# Patient Record
Sex: Female | Born: 1970 | Race: Black or African American | Hispanic: No | Marital: Single | Smoking: Never smoker
Health system: Southern US, Community
[De-identification: ages and names within clinical notes are randomized; demographics above are authoritative.]

## PROBLEM LIST (undated history)

## (undated) DIAGNOSIS — J9601 Acute respiratory failure with hypoxia: Secondary | ICD-10-CM

## (undated) DIAGNOSIS — I2489 Other forms of acute ischemic heart disease: Secondary | ICD-10-CM

## (undated) DIAGNOSIS — D649 Anemia, unspecified: Secondary | ICD-10-CM

## (undated) DIAGNOSIS — J189 Pneumonia, unspecified organism: Secondary | ICD-10-CM

## (undated) DIAGNOSIS — E876 Hypokalemia: Secondary | ICD-10-CM

## (undated) DIAGNOSIS — N179 Acute kidney failure, unspecified: Secondary | ICD-10-CM

## (undated) DIAGNOSIS — E162 Hypoglycemia, unspecified: Secondary | ICD-10-CM

## (undated) DIAGNOSIS — R197 Diarrhea, unspecified: Secondary | ICD-10-CM

## (undated) DIAGNOSIS — I2699 Other pulmonary embolism without acute cor pulmonale: Secondary | ICD-10-CM

## (undated) DIAGNOSIS — G4733 Obstructive sleep apnea (adult) (pediatric): Secondary | ICD-10-CM

## (undated) DIAGNOSIS — R7401 Elevation of levels of liver transaminase levels: Secondary | ICD-10-CM

## (undated) DIAGNOSIS — I248 Other forms of acute ischemic heart disease: Secondary | ICD-10-CM

## (undated) DIAGNOSIS — J9602 Acute respiratory failure with hypercapnia: Secondary | ICD-10-CM

## (undated) HISTORY — DX: Elevation of levels of liver transaminase levels: R74.01

## (undated) HISTORY — DX: Other pulmonary embolism without acute cor pulmonale: I26.99

## (undated) HISTORY — DX: Anemia, unspecified: D64.9

## (undated) HISTORY — DX: Diarrhea, unspecified: R19.7

## (undated) HISTORY — DX: Acute respiratory failure with hypoxia: J96.01

## (undated) HISTORY — DX: Other forms of acute ischemic heart disease: I24.8

## (undated) HISTORY — DX: Other forms of acute ischemic heart disease: I24.89

## (undated) HISTORY — DX: Hypokalemia: E87.6

## (undated) HISTORY — DX: Obstructive sleep apnea (adult) (pediatric): G47.33

## (undated) HISTORY — DX: Acute kidney failure, unspecified: N17.9

## (undated) HISTORY — DX: Morbid (severe) obesity due to excess calories: E66.01

## (undated) HISTORY — DX: Acute respiratory failure with hypoxia: J96.02

## (undated) HISTORY — DX: Hypoglycemia, unspecified: E16.2

## (undated) HISTORY — DX: Pneumonia, unspecified organism: J18.9

---

## 2020-06-25 ENCOUNTER — Emergency Department (HOSPITAL_COMMUNITY): Payer: Self-pay

## 2020-06-25 ENCOUNTER — Inpatient Hospital Stay (HOSPITAL_COMMUNITY): Payer: Self-pay

## 2020-06-25 ENCOUNTER — Inpatient Hospital Stay (HOSPITAL_COMMUNITY)
Admission: EM | Admit: 2020-06-25 | Discharge: 2020-07-25 | DRG: 208 | Disposition: A | Discharge status: Home / Self Care | Payer: Self-pay | Attending: Internal Medicine | Admitting: Internal Medicine

## 2020-06-25 DIAGNOSIS — J9621 Acute and chronic respiratory failure with hypoxia: Secondary | ICD-10-CM | POA: Diagnosis present

## 2020-06-25 DIAGNOSIS — E662 Morbid (severe) obesity with alveolar hypoventilation: Secondary | ICD-10-CM | POA: Diagnosis present

## 2020-06-25 DIAGNOSIS — E870 Hyperosmolality and hypernatremia: Secondary | ICD-10-CM | POA: Diagnosis not present

## 2020-06-25 DIAGNOSIS — R7989 Other specified abnormal findings of blood chemistry: Secondary | ICD-10-CM

## 2020-06-25 DIAGNOSIS — J9602 Acute respiratory failure with hypercapnia: Secondary | ICD-10-CM

## 2020-06-25 DIAGNOSIS — Z452 Encounter for adjustment and management of vascular access device: Secondary | ICD-10-CM

## 2020-06-25 DIAGNOSIS — R0602 Shortness of breath: Secondary | ICD-10-CM

## 2020-06-25 DIAGNOSIS — G9341 Metabolic encephalopathy: Secondary | ICD-10-CM

## 2020-06-25 DIAGNOSIS — E874 Mixed disorder of acid-base balance: Secondary | ICD-10-CM | POA: Diagnosis present

## 2020-06-25 DIAGNOSIS — J9692 Respiratory failure, unspecified with hypercapnia: Secondary | ICD-10-CM | POA: Diagnosis present

## 2020-06-25 DIAGNOSIS — Z597 Insufficient social insurance and welfare support: Secondary | ICD-10-CM

## 2020-06-25 DIAGNOSIS — R0603 Acute respiratory distress: Secondary | ICD-10-CM

## 2020-06-25 DIAGNOSIS — J189 Pneumonia, unspecified organism: Secondary | ICD-10-CM | POA: Insufficient documentation

## 2020-06-25 DIAGNOSIS — T502X5A Adverse effect of carbonic-anhydrase inhibitors, benzothiadiazides and other diuretics, initial encounter: Secondary | ICD-10-CM | POA: Diagnosis not present

## 2020-06-25 DIAGNOSIS — E162 Hypoglycemia, unspecified: Secondary | ICD-10-CM

## 2020-06-25 DIAGNOSIS — R7401 Elevation of levels of liver transaminase levels: Secondary | ICD-10-CM

## 2020-06-25 DIAGNOSIS — Z5901 Sheltered homelessness: Secondary | ICD-10-CM

## 2020-06-25 DIAGNOSIS — R34 Anuria and oliguria: Secondary | ICD-10-CM | POA: Diagnosis not present

## 2020-06-25 DIAGNOSIS — Z6841 Body Mass Index (BMI) 40.0 and over, adult: Secondary | ICD-10-CM

## 2020-06-25 DIAGNOSIS — R778 Other specified abnormalities of plasma proteins: Secondary | ICD-10-CM

## 2020-06-25 DIAGNOSIS — R197 Diarrhea, unspecified: Secondary | ICD-10-CM | POA: Diagnosis not present

## 2020-06-25 DIAGNOSIS — D6489 Other specified anemias: Secondary | ICD-10-CM | POA: Diagnosis present

## 2020-06-25 DIAGNOSIS — Z86711 Personal history of pulmonary embolism: Secondary | ICD-10-CM

## 2020-06-25 DIAGNOSIS — R7303 Prediabetes: Secondary | ICD-10-CM | POA: Diagnosis present

## 2020-06-25 DIAGNOSIS — J9690 Respiratory failure, unspecified, unspecified whether with hypoxia or hypercapnia: Secondary | ICD-10-CM

## 2020-06-25 DIAGNOSIS — D638 Anemia in other chronic diseases classified elsewhere: Secondary | ICD-10-CM | POA: Diagnosis present

## 2020-06-25 DIAGNOSIS — D696 Thrombocytopenia, unspecified: Secondary | ICD-10-CM | POA: Diagnosis present

## 2020-06-25 DIAGNOSIS — E860 Dehydration: Secondary | ICD-10-CM | POA: Diagnosis present

## 2020-06-25 DIAGNOSIS — I2609 Other pulmonary embolism with acute cor pulmonale: Secondary | ICD-10-CM | POA: Diagnosis present

## 2020-06-25 DIAGNOSIS — I50810 Right heart failure, unspecified: Secondary | ICD-10-CM | POA: Diagnosis present

## 2020-06-25 DIAGNOSIS — I959 Hypotension, unspecified: Secondary | ICD-10-CM | POA: Diagnosis present

## 2020-06-25 DIAGNOSIS — K7200 Acute and subacute hepatic failure without coma: Secondary | ICD-10-CM | POA: Diagnosis present

## 2020-06-25 DIAGNOSIS — E876 Hypokalemia: Secondary | ICD-10-CM

## 2020-06-25 DIAGNOSIS — J9622 Acute and chronic respiratory failure with hypercapnia: Principal | ICD-10-CM | POA: Diagnosis present

## 2020-06-25 DIAGNOSIS — J9601 Acute respiratory failure with hypoxia: Secondary | ICD-10-CM

## 2020-06-25 DIAGNOSIS — I2699 Other pulmonary embolism without acute cor pulmonale: Secondary | ICD-10-CM

## 2020-06-25 DIAGNOSIS — I248 Other forms of acute ischemic heart disease: Secondary | ICD-10-CM | POA: Diagnosis present

## 2020-06-25 DIAGNOSIS — N179 Acute kidney failure, unspecified: Secondary | ICD-10-CM

## 2020-06-25 DIAGNOSIS — Z20822 Contact with and (suspected) exposure to covid-19: Secondary | ICD-10-CM | POA: Diagnosis present

## 2020-06-25 DIAGNOSIS — K8020 Calculus of gallbladder without cholecystitis without obstruction: Secondary | ICD-10-CM | POA: Diagnosis present

## 2020-06-25 DIAGNOSIS — I471 Supraventricular tachycardia: Secondary | ICD-10-CM | POA: Diagnosis present

## 2020-06-25 HISTORY — DX: Respiratory failure, unspecified with hypercapnia: J96.92

## 2020-06-25 LAB — I-STAT CHEM 8, ED
BUN: 44 mg/dL — ABNORMAL HIGH (ref 6–20)
Calcium, Ion: 0.89 mmol/L — CL (ref 1.15–1.40)
Chloride: 99 mmol/L (ref 98–111)
Creatinine, Ser: 3.5 mg/dL — ABNORMAL HIGH (ref 0.44–1.00)
Glucose, Bld: 83 mg/dL (ref 70–99)
HCT: 50 % — ABNORMAL HIGH (ref 36.0–46.0)
Hemoglobin: 17 g/dL — ABNORMAL HIGH (ref 12.0–15.0)
Potassium: 4.5 mmol/L (ref 3.5–5.1)
Sodium: 140 mmol/L (ref 135–145)
TCO2: 29 mmol/L (ref 22–32)

## 2020-06-25 LAB — I-STAT ARTERIAL BLOOD GAS, ED
Acid-Base Excess: 2 mmol/L (ref 0.0–2.0)
Bicarbonate: 32.9 mmol/L — ABNORMAL HIGH (ref 20.0–28.0)
Calcium, Ion: 0.92 mmol/L — ABNORMAL LOW (ref 1.15–1.40)
HCT: 45 % (ref 36.0–46.0)
Hemoglobin: 15.3 g/dL — ABNORMAL HIGH (ref 12.0–15.0)
O2 Saturation: 43 %
Patient temperature: 97.9
Potassium: 4.3 mmol/L (ref 3.5–5.1)
Sodium: 141 mmol/L (ref 135–145)
TCO2: 35 mmol/L — ABNORMAL HIGH (ref 22–32)
pCO2 arterial: 78.3 mmHg (ref 32.0–48.0)
pH, Arterial: 7.229 — ABNORMAL LOW (ref 7.350–7.450)
pO2, Arterial: 29 mmHg — CL (ref 83.0–108.0)

## 2020-06-25 LAB — CBC WITH DIFFERENTIAL/PLATELET
Abs Immature Granulocytes: 0.37 10*3/uL — ABNORMAL HIGH (ref 0.00–0.07)
Basophils Absolute: 0 10*3/uL (ref 0.0–0.1)
Basophils Relative: 0 %
Eosinophils Absolute: 0 10*3/uL (ref 0.0–0.5)
Eosinophils Relative: 0 %
HCT: 44.1 % (ref 36.0–46.0)
Hemoglobin: 12 g/dL (ref 12.0–15.0)
Immature Granulocytes: 2 %
Lymphocytes Relative: 7 %
Lymphs Abs: 1.1 10*3/uL (ref 0.7–4.0)
MCH: 24.5 pg — ABNORMAL LOW (ref 26.0–34.0)
MCHC: 27.2 g/dL — ABNORMAL LOW (ref 30.0–36.0)
MCV: 90.2 fL (ref 80.0–100.0)
Monocytes Absolute: 0.9 10*3/uL (ref 0.1–1.0)
Monocytes Relative: 6 %
Neutro Abs: 13.2 10*3/uL — ABNORMAL HIGH (ref 1.7–7.7)
Neutrophils Relative %: 85 %
Platelets: 320 10*3/uL (ref 150–400)
RBC: 4.89 MIL/uL (ref 3.87–5.11)
RDW: 18.6 % — ABNORMAL HIGH (ref 11.5–15.5)
WBC: 15.6 10*3/uL — ABNORMAL HIGH (ref 4.0–10.5)
nRBC: 2.2 % — ABNORMAL HIGH (ref 0.0–0.2)

## 2020-06-25 LAB — GLUCOSE, CAPILLARY: Glucose-Capillary: 95 mg/dL (ref 70–99)

## 2020-06-25 LAB — URINALYSIS, ROUTINE W REFLEX MICROSCOPIC
Glucose, UA: 50 mg/dL — AB
Hgb urine dipstick: NEGATIVE
Ketones, ur: 5 mg/dL — AB
Leukocytes,Ua: NEGATIVE
Nitrite: NEGATIVE
Protein, ur: 100 mg/dL — AB
Specific Gravity, Urine: 1.02 (ref 1.005–1.030)
pH: 5 (ref 5.0–8.0)

## 2020-06-25 LAB — LACTIC ACID, PLASMA
Lactic Acid, Venous: 5.9 mmol/L (ref 0.5–1.9)
Lactic Acid, Venous: 4.9 mmol/L (ref 0.5–1.9)

## 2020-06-25 LAB — CREATININE, SERUM
Creatinine, Ser: 3.62 mg/dL — ABNORMAL HIGH (ref 0.44–1.00)
GFR, Estimated: 15 mL/min — ABNORMAL LOW (ref >60)

## 2020-06-25 LAB — COMPREHENSIVE METABOLIC PANEL
ALT: 346 U/L — ABNORMAL HIGH (ref 0–44)
AST: 762 U/L — ABNORMAL HIGH (ref 15–41)
Albumin: 3.6 g/dL (ref 3.5–5.0)
Alkaline Phosphatase: 110 U/L (ref 38–126)
Anion gap: 24 — ABNORMAL HIGH (ref 5–15)
BUN: 41 mg/dL — ABNORMAL HIGH (ref 6–20)
CO2: 20 mmol/L — ABNORMAL LOW (ref 22–32)
Calcium: 8.1 mg/dL — ABNORMAL LOW (ref 8.9–10.3)
Chloride: 96 mmol/L — ABNORMAL LOW (ref 98–111)
Creatinine, Ser: 3.83 mg/dL — ABNORMAL HIGH (ref 0.44–1.00)
GFR, Estimated: 14 mL/min — ABNORMAL LOW (ref >60)
Glucose, Bld: 76 mg/dL (ref 70–99)
Potassium: 4.6 mmol/L (ref 3.5–5.1)
Sodium: 140 mmol/L (ref 135–145)
Total Bilirubin: 4.5 mg/dL — ABNORMAL HIGH (ref 0.3–1.2)
Total Protein: 7.9 g/dL (ref 6.5–8.1)

## 2020-06-25 LAB — RAPID URINE DRUG SCREEN, HOSP PERFORMED
Amphetamines: NOT DETECTED
Barbiturates: NOT DETECTED
Benzodiazepines: NOT DETECTED
Cocaine: NOT DETECTED
Opiates: NOT DETECTED
Tetrahydrocannabinol: NOT DETECTED

## 2020-06-25 LAB — CK: Total CK: 124 U/L (ref 38–234)

## 2020-06-25 LAB — RESP PANEL BY RT-PCR (FLU A&B, COVID) ARPGX2
Influenza A by PCR: NEGATIVE
Influenza B by PCR: NEGATIVE
SARS Coronavirus 2 by RT PCR: NEGATIVE

## 2020-06-25 LAB — PROTIME-INR
INR: 2.1 — ABNORMAL HIGH (ref 0.8–1.2)
Prothrombin Time: 23.5 seconds — ABNORMAL HIGH (ref 11.4–15.2)

## 2020-06-25 LAB — TYPE AND SCREEN
ABO/RH(D): O POS
Antibody Screen: NEGATIVE

## 2020-06-25 LAB — TROPONIN I (HIGH SENSITIVITY)
Troponin I (High Sensitivity): 120 ng/L (ref <18)
Troponin I (High Sensitivity): 205 ng/L (ref <18)

## 2020-06-25 LAB — CBG MONITORING, ED: Glucose-Capillary: 84 mg/dL (ref 70–99)

## 2020-06-25 MED ORDER — CHLORHEXIDINE GLUCONATE 0.12% ORAL RINSE (MEDLINE KIT)
15.0000 mL | Freq: Two times a day (BID) | OROMUCOSAL | Status: DC
  Administered 2020-06-25 – 2020-06-29 (×8): 15 mL via OROMUCOSAL

## 2020-06-25 MED ORDER — ETOMIDATE 2 MG/ML IV SOLN
INTRAVENOUS | Status: AC
  Filled 2020-06-25: qty 20

## 2020-06-25 MED ORDER — SODIUM CHLORIDE 0.9 % IV BOLUS
1000.0000 mL | Freq: Once | INTRAVENOUS | Status: AC
  Administered 2020-06-25: 1000 mL via INTRAVENOUS

## 2020-06-25 MED ORDER — POLYETHYLENE GLYCOL 3350 17 G PO PACK: 17.0000 g | PACK | Freq: Every day | ORAL | Status: DC | PRN

## 2020-06-25 MED ORDER — INSULIN ASPART 100 UNIT/ML IJ SOLN: 0.0000 [IU] | INTRAMUSCULAR | Status: DC

## 2020-06-25 MED ORDER — LACTATED RINGERS IV BOLUS: 1000.0000 mL | Freq: Once | INTRAVENOUS | Status: DC

## 2020-06-25 MED ORDER — SUCCINYLCHOLINE CHLORIDE 200 MG/10ML IV SOSY
PREFILLED_SYRINGE | INTRAVENOUS | Status: AC
  Filled 2020-06-25: qty 10

## 2020-06-25 MED ORDER — FENTANYL CITRATE (PF) 100 MCG/2ML IJ SOLN
100.0000 ug | INTRAMUSCULAR | Status: DC | PRN
  Administered 2020-06-26 – 2020-06-28 (×4): 100 ug via INTRAVENOUS
  Filled 2020-06-25 (×4): qty 2

## 2020-06-25 MED ORDER — ORAL CARE MOUTH RINSE
15.0000 mL | OROMUCOSAL | Status: DC
  Administered 2020-06-25 – 2020-06-29 (×34): 15 mL via OROMUCOSAL

## 2020-06-25 MED ORDER — ETOMIDATE 2 MG/ML IV SOLN
INTRAVENOUS | Status: AC | PRN
  Administered 2020-06-25: 20 mg via INTRAVENOUS

## 2020-06-25 MED ORDER — DOCUSATE SODIUM 100 MG PO CAPS: 100.0000 mg | ORAL_CAPSULE | Freq: Two times a day (BID) | ORAL | Status: DC | PRN

## 2020-06-25 MED ORDER — SUCCINYLCHOLINE CHLORIDE 20 MG/ML IJ SOLN
INTRAMUSCULAR | Status: AC | PRN
  Administered 2020-06-25: 100 mg via INTRAVENOUS

## 2020-06-25 MED ORDER — PROPOFOL 1000 MG/100ML IV EMUL
INTRAVENOUS | Status: AC | PRN
  Administered 2020-06-25: 5 ug via INTRAVENOUS

## 2020-06-25 MED ORDER — PROPOFOL 1000 MG/100ML IV EMUL
0.0000 ug/kg/min | INTRAVENOUS | Status: DC
  Administered 2020-06-25: 5 ug/kg/min via INTRAVENOUS
  Administered 2020-06-25: 10 ug/kg/min via INTRAVENOUS
  Administered 2020-06-26: 40 ug/kg/min via INTRAVENOUS
  Administered 2020-06-26: 30 ug/kg/min via INTRAVENOUS
  Administered 2020-06-26: 10 ug/kg/min via INTRAVENOUS
  Administered 2020-06-26 (×3): 50 ug/kg/min via INTRAVENOUS
  Administered 2020-06-26 – 2020-06-27 (×2): 40 ug/kg/min via INTRAVENOUS
  Administered 2020-06-27: 20 ug/kg/min via INTRAVENOUS
  Administered 2020-06-27: 50 ug/kg/min via INTRAVENOUS
  Administered 2020-06-27: 25 ug/kg/min via INTRAVENOUS
  Administered 2020-06-27: 30 ug/kg/min via INTRAVENOUS
  Administered 2020-06-27: 22.222 ug/kg/min via INTRAVENOUS
  Administered 2020-06-27: 25 ug/kg/min via INTRAVENOUS
  Administered 2020-06-27 – 2020-06-28 (×3): 30 ug/kg/min via INTRAVENOUS
  Administered 2020-06-28 (×2): 40 ug/kg/min via INTRAVENOUS
  Administered 2020-06-28 (×2): 30 ug/kg/min via INTRAVENOUS
  Administered 2020-06-29 (×3): 40 ug/kg/min via INTRAVENOUS
  Filled 2020-06-25 (×25): qty 100

## 2020-06-25 MED ORDER — HEPARIN SODIUM (PORCINE) 5000 UNIT/ML IJ SOLN
5000.0000 [IU] | Freq: Three times a day (TID) | INTRAMUSCULAR | Status: DC
  Administered 2020-06-26: 5000 [IU] via SUBCUTANEOUS
  Filled 2020-06-25: qty 1

## 2020-06-25 MED ORDER — SODIUM CHLORIDE 0.9 % IV SOLN
500.0000 mg | Freq: Once | INTRAVENOUS | Status: AC
  Administered 2020-06-25: 500 mg via INTRAVENOUS
  Filled 2020-06-25: qty 500

## 2020-06-25 MED ORDER — SODIUM CHLORIDE 0.9 % IV SOLN
3.0000 g | Freq: Two times a day (BID) | INTRAVENOUS | Status: DC
  Administered 2020-06-25 – 2020-06-26 (×2): 3 g via INTRAVENOUS
  Filled 2020-06-25 (×4): qty 8

## 2020-06-25 MED ORDER — ROCURONIUM BROMIDE 10 MG/ML (PF) SYRINGE
PREFILLED_SYRINGE | INTRAVENOUS | Status: AC
  Filled 2020-06-25: qty 10

## 2020-06-25 MED ORDER — NALOXONE HCL 0.4 MG/ML IJ SOLN
INTRAMUSCULAR | Status: AC
  Filled 2020-06-25: qty 1

## 2020-06-25 MED ORDER — SODIUM CHLORIDE 0.9 % IV SOLN
INTRAVENOUS | Status: AC | PRN
  Administered 2020-06-25: 999 mL/h via INTRAVENOUS

## 2020-06-25 MED ORDER — SODIUM CHLORIDE 0.9 % IV SOLN
1.0000 g | Freq: Once | INTRAVENOUS | Status: AC
  Administered 2020-06-25: 1 g via INTRAVENOUS
  Filled 2020-06-25: qty 10

## 2020-06-25 MED ORDER — PANTOPRAZOLE SODIUM 40 MG IV SOLR
40.0000 mg | Freq: Every day | INTRAVENOUS | Status: DC
  Administered 2020-06-25 – 2020-07-01 (×7): 40 mg via INTRAVENOUS
  Filled 2020-06-25 (×7): qty 40

## 2020-06-25 MED ORDER — CHLORHEXIDINE GLUCONATE CLOTH 2 % EX PADS
6.0000 | MEDICATED_PAD | Freq: Every day | CUTANEOUS | Status: DC
  Administered 2020-06-25 – 2020-07-01 (×6): 6 via TOPICAL

## 2020-06-25 MED ORDER — NALOXONE HCL 2 MG/2ML IJ SOSY
PREFILLED_SYRINGE | INTRAMUSCULAR | Status: AC
  Administered 2020-06-25: 2 mg
  Filled 2020-06-25: qty 2

## 2020-06-25 MED ORDER — FENTANYL CITRATE (PF) 100 MCG/2ML IJ SOLN
100.0000 ug | INTRAMUSCULAR | Status: AC | PRN
Start: 2020-06-25 — End: 2020-06-25
  Administered 2020-06-25 (×3): 100 ug via INTRAVENOUS
  Filled 2020-06-25 (×3): qty 2

## 2020-06-25 NOTE — Code Documentation (Signed)
NRB off

## 2020-06-25 NOTE — ED Notes (Addendum)
Pt transferred to [facility] via [facility]. Pt transferred to [facility] vent and stretcher.

## 2020-06-25 NOTE — Code Documentation (Signed)
Getting ready to intubate

## 2020-06-25 NOTE — Progress Notes (Addendum)
eLink Physician-Brief Progress Note Patient Name: Felicia Warren DOB: 1970/07/17 MRN: [id number]   Date of Service  06/25/2020  HPI/Events of Note  Patient admitted with altered mental status, bilateral multi-focal pneumonia, and acute hypoxemic/ hypercapnic respiratory failure, she is intubated and on the ventilator.  eICU Interventions   New Patient Evaluation. Restraints re-ordered.        Felicia Warren Felicia Warren 06/25/2020, 10:37 PM

## 2020-06-25 NOTE — Code Documentation (Signed)
Pt remains on NRB

## 2020-06-25 NOTE — ED Notes (Signed)
This RN informed Dr. Particia Nearing that pt is moving even with propofol going at 10. This RN informed Dr. Particia Nearing of pts most recent BP. Per Dr Particia Nearing increase pts Prop and hang fluids

## 2020-06-25 NOTE — Code Documentation (Signed)
Pt being bagged by RT 

## 2020-06-25 NOTE — ED Notes (Signed)
Glucometer not reading pts band. This RN checked pt CBG. CBG 107

## 2020-06-25 NOTE — ED Triage Notes (Addendum)
BIB EMS from hotel for [place]. When EMS arrive pt was sitting. Pt hads an episode of AMS, sugar was 40. Recheck CBG was 90. Pt was breathing 30-40 on arrival

## 2020-06-25 NOTE — Progress Notes (Signed)
Patient transported to CT & back to ED on the ventilator with no problems.

## 2020-06-25 NOTE — H&P (Addendum)
NAME:  Felicia Warren, MRN:  [id number], DOB:  [birth year], LOS: 0 ADMISSION DATE:  06/25/2020, CONSULTATION DATE:  06/25/20 REFERRING MD:  EDP, CHIEF COMPLAINT:  AMS   History of Present Illness:   50 y/o Female who presented as a Felicia Warren, found with altered mental status in a motel by staff.  It is unknown when her last known well was.  EMS found patient awake, but altered glucose was initially 40.  This was corrected, but she continued to have worsening mental status.  Narcan did not provide any response.  In ED she required nonrebreather mask and eventual intubation for hypoxic respiratory failure.  Chest x-ray with probable multifocal pneumonia.  Labs partially completed at the time of admission, but notable for white blood cell count of 15 K, creatinine 3 and INR of 2.1.  Metabolic panel, ABG, troponin, lactic acid CT head all pending.  At the time of exam, patient was sleeping while sedated with propofol, however was arousable to sternal rub and following commands.  No prior history in epic and no known emergency contact.  Pertinent  Medical History  Obesity,?  OSA  Significant Hospital Events: Including procedures, antibiotic start and stop dates in addition to other pertinent events   . 5/4 presented to ED at [facility] ago, required intubation, PCCM consult.  Unasyn initiated  Interim History / Subjective:  Mental status improving post-intubation Blood pressure stable  Objective   Blood pressure (!) 97/47, pulse (!) 106, temperature 98.7 F (37.1 C), resp. rate 18, height 5\' 6"  (1.676 m), weight (!) 150 kg, SpO2 (!) 86 %.    Vent Mode: PRVC FiO2 (%):  [100 %] 100 % Set Rate:  [18 bmp] 18 bmp Vt Set:  [470 mL] 470 mL PEEP:  [5 cmH20] 5 cmH20 Plateau Pressure:  [30 cmH20] 30 cmH20  No intake or output data in the 24 hours ending 06/25/20 1825 Filed Weights   06/25/20 1535  Weight: (!) 150 kg    General: Morbidly obese female, intubated and sedated and acute distress HEENT: MM  pink/dry, ETT in place with bilious output from OG tube Neuro: Opens eyes to voice, nods and moves all extremities to command, pupils equal and reactive CV: s1s2 RRR, no m/r/g PULM: On full vent support, mild rhonchi bilateral bases, no tachypnea, oxygen saturations greater than 98% on PEEP of 500% FiO2 GI: soft, obese bsx4 active  Extremities: warm/dry, 1+ edema  Skin: no rashes or lesions   Labs/imaging that I havepersonally reviewed  (right click and "Reselect all SmartList Selections" daily)  CXR CBC   Resolved Hospital Problem list     Assessment & Plan:   Altered mental status secondary to hypoglycemia and likely hypercapnic Respiratory failure Unknown past medical history, but would suspect some element of obesity hypoventilation syndrome or OSA P: -Admit to ICU -Follow CT head and remainder of blood work, UDS pending -Follow-up ABG -Point-of-care glucose checks, D50 as needed, unknown if pt is on DM treatment at home -Improving post intubation, suspect largely secondary to hypercapnia   Acute hypoxic respiratory failure year and likely CAP versus aspiration Bilious emesis OG tube P: -start Unasyn, follow blood cultures -ABG pending -Appears dry, with leukocytosis but does not appear septic, bolus 2 L LR --Maintain full vent support with SAT/SBT as tolerated -titrate Vent setting to maintain SpO2 greater than or equal to 90%. -HOB elevated 30 degrees. -Plateau pressures less than 30 cm H20.  -Follow chest x-ray, ABG prn.   -Bronchial hygiene and  RT/bronchodilator protocol.    Renal failure, undetermined acuity No baseline creatinine for comparison, minimal urine output in the ED P: -Appears clinically dry, bolused 2 L LR and monitor urine output along with repeat metabolic panel and electrolytes   Best practice (right click and "Reselect all SmartList Selections" daily)  Diet:  NPO Pain/Anxiety/Delirium protocol (if indicated): Yes (RASS goal +1) VAP  protocol (if indicated): Yes DVT prophylaxis: Subcutaneous Heparin GI prophylaxis: PPI Glucose control:  SSI Yes Central venous access:  N/A Arterial line:  N/A Foley:  Yes, and it is still needed Mobility:  bed rest  PT consulted: N/A Last date of multidisciplinary goals of care discussion [pending] Code Status:  full code Disposition: ICU  Labs   CBC: Recent Labs  Lab 06/25/20 1524 06/25/20 1715  WBC  --  15.6*  NEUTROABS  --  13.2*  HGB 17.0* 12.0  HCT 50.0* 44.1  MCV  --  90.2  PLT  --  320    Basic Metabolic Panel: Recent Labs  Lab 06/25/20 1524  NA 140  K 4.5  CL 99  GLUCOSE 83  BUN 44*  CREATININE 3.50*   GFR: Estimated Creatinine Clearance: 28.7 mL/min (A) (by C-G formula based on SCr of 3.5 mg/dL (H)). Recent Labs  Lab 06/25/20 1715  WBC 15.6*    Liver Function Tests: No results for input(s): AST, ALT, ALKPHOS, BILITOT, PROT, ALBUMIN in the last 168 hours. No results for input(s): LIPASE, AMYLASE in the last 168 hours. No results for input(s): AMMONIA in the last 168 hours.  ABG    Component Value Date/Time   TCO2 29 06/25/2020 1524     Coagulation Profile: Recent Labs  Lab 06/25/20 1715  INR 2.1*    Cardiac Enzymes: No results for input(s): CKTOTAL, CKMB, CKMBINDEX, TROPONINI in the last 168 hours.  HbA1C: No results found for: HGBA1C  CBG: Recent Labs  Lab 06/25/20 1447  GLUCAP 84    Review of Systems:   Unable to obtain secondary to intubation  Past Medical History:  She,  has no past medical history on file.   Surgical History:  Unable to obtain secondary to intubation  Social History:    Unavailable  Family History:  Unavailable  Allergies Not on File   Home Medications  Prior to Admission medications   Not on File     Critical care time: 40 minutes      CRITICAL CARE Performed by: Darcella Gasman Babara Buffalo   Total critical care time: 40 minutes  Critical care time was exclusive of separately billable  procedures and treating other patients.  Critical care was necessary to treat or prevent imminent or life-threatening deterioration.  Critical care was time spent personally by me on the following activities: development of treatment plan with patient and/or surrogate as well as nursing, discussions with consultants, evaluation of patient's response to treatment, examination of patient, obtaining history from patient or surrogate, ordering and performing treatments and interventions, ordering and review of laboratory studies, ordering and review of radiographic studies, pulse oximetry and re-evaluation of patient's condition.  Darcella Gasman Teanna Elem, PA-C [facility] See Amion for pager If no response to pager , please call [phone] until 7pm After 7:00 pm call Elink  [phone]

## 2020-06-25 NOTE — ED Notes (Signed)
Pt to be transferred to [facility]. Secretary to call [employer] and transport

## 2020-06-25 NOTE — ED Notes (Signed)
RT, provider at bedside.

## 2020-06-25 NOTE — Progress Notes (Signed)
Pharmacy Antibiotic Note  Felicia Warren is a 50 y.o. female admitted on 06/25/2020 with aspiration pneumonia.  Pharmacy has been consulted for Unasyn dosing. Patient presented altered from motel, did not respond to glucose correction or narcan. Patient required intubation in the ED due to hypoxic respiratory failure. Chest x-ray positive for multifocal PNA. On admission, WBC 15, Scr 3.5, and afebrile.   Plan: Unasyn 3G Q12H  Monitor clinical status, culture data, and renal function for dose adjustments  Height: 5\' 6"  (167.6 cm) Weight: (!) 150 kg (330 lb 11 oz) IBW/kg (Calculated) : 59.3  Temp (24hrs), Avg:98.6 F (37 C), Min:98.3 F (36.8 C), Max:98.7 F (37.1 C)  Recent Labs  Lab 06/25/20 1524 06/25/20 1715  WBC  --  15.6*  CREATININE 3.50*  --     Estimated Creatinine Clearance: 28.7 mL/min (A) (by C-G formula based on SCr of 3.5 mg/dL (H)).    Not on File  Antimicrobials this admission: 5/4 Azith >>  5/4 Unasyn >>  5/4 CTZ x1   Dose adjustments this admission:  Microbiology results: 5/4 BCx:    Thank you for allowing pharmacy to be a part of this patient's care.  7/4, PharmD, MBA Pharmacy Resident [phone] 06/25/2020 7:02 PM

## 2020-06-25 NOTE — ED Notes (Signed)
Pt back from CT

## 2020-06-25 NOTE — ED Provider Notes (Signed)
[facility] EMERGENCY DEPARTMENT Provider Note   CSN: [id number] Arrival date & time: 06/25/20  1443     History Chief Complaint  Patient presents with  . unresponsive    Felicia Warren is a 50 y.o. female.  Pt presents to the ED today with AMS.  We know very little about the patient.  She was apparently staying at a hotel.  The hotel staff checked on her and found her minimally responsive.  We have no idea how long she was like that.  The staff called EMS who said she actually stood up and walked to the stretcher.  She then started acting very strangely.  They checked a bs and it was low, so they gave her glucose.  Pt continued to act very strangely upon arrival to the ED.  Pt's O2 sat was 85% on a NRB upon arrival in the ED.  Pt was given 2 mg of Narcan IN without any change in MS.        No past medical history on file.  Patient Active Problem List   Diagnosis Date Noted  . Hypercapnic respiratory failure (HCC) 06/25/2020  . Acute renal failure (HCC)   . Acute respiratory failure with hypoxia (HCC)   . Community acquired pneumonia      OB History   No obstetric history on file.     No family history on file.     Home Medications Prior to Admission medications   Not on File    Allergies    Patient has no allergy information on record.  Review of Systems   Review of Systems  Unable to perform ROS: Mental status change    Physical Exam Updated Vital Signs BP 116/63   Pulse 92   Temp 98.5 F (36.9 C)   Resp 18   Ht 5\' 6"  (1.676 m)   Wt (!) 150 kg   SpO2 99%   BMI 53.37 kg/m   Physical Exam Vitals and nursing note reviewed.  Constitutional:      General: She is in acute distress.     Appearance: She is ill-appearing and toxic-appearing.  HENT:     Head: Normocephalic and atraumatic.     Right Ear: External ear normal.     Left Ear: External ear normal.     Nose: Nose normal.     Mouth/Throat:     Mouth: Mucous membranes are  dry.  Eyes:     Extraocular Movements: Extraocular movements intact.     Conjunctiva/sclera: Conjunctivae normal.     Pupils: Pupils are equal, round, and reactive to light.  Cardiovascular:     Rate and Rhythm: Regular rhythm. Tachycardia present.     Pulses: Normal pulses.     Heart sounds: Normal heart sounds.  Pulmonary:     Effort: Respiratory distress present.  Abdominal:     General: Abdomen is flat. Bowel sounds are normal.     Palpations: Abdomen is soft.  Musculoskeletal:        General: No signs of injury.     Cervical back: Normal range of motion and neck supple.  Skin:    General: Skin is warm.     Capillary Refill: Capillary refill takes less than 2 seconds.  Neurological:     Mental Status: She is disoriented.     Comments: Pt walked for EMS a few steps.  She is moving both arms in ED, but not her legs.  She did talk, but what she  said did not make sense.  She is not following commands.  Psychiatric:     Comments: Unable to assess     ED Results / Procedures / Treatments   Labs (all labs ordered are listed, but only abnormal results are displayed) Labs Reviewed  COMPREHENSIVE METABOLIC PANEL - Abnormal; Notable for the following components:      Result Value   Chloride 96 (*)    CO2 20 (*)    BUN 41 (*)    Creatinine, Ser 3.83 (*)    Calcium 8.1 (*)    AST 762 (*)    ALT 346 (*)    Total Bilirubin 4.5 (*)    GFR, Estimated 14 (*)    Anion gap 24 (*)    All other components within normal limits  LACTIC ACID, PLASMA - Abnormal; Notable for the following components:   Lactic Acid, Venous 5.9 (*)    All other components within normal limits  URINALYSIS, ROUTINE W REFLEX MICROSCOPIC - Abnormal; Notable for the following components:   Color, Urine AMBER (*)    APPearance CLOUDY (*)    Glucose, UA 50 (*)    Bilirubin Urine SMALL (*)    Ketones, ur 5 (*)    Protein, ur 100 (*)    Bacteria, UA MANY (*)    All other components within normal limits  CBC  WITH DIFFERENTIAL/PLATELET - Abnormal; Notable for the following components:   WBC 15.6 (*)    MCH 24.5 (*)    MCHC 27.2 (*)    RDW 18.6 (*)    nRBC 2.2 (*)    Neutro Abs 13.2 (*)    Abs Immature Granulocytes 0.37 (*)    All other components within normal limits  PROTIME-INR - Abnormal; Notable for the following components:   Prothrombin Time 23.5 (*)    INR 2.1 (*)    All other components within normal limits  I-STAT CHEM 8, ED - Abnormal; Notable for the following components:   BUN 44 (*)    Creatinine, Ser 3.50 (*)    Calcium, Ion 0.89 (*)    Hemoglobin 17.0 (*)    HCT 50.0 (*)    All other components within normal limits  TROPONIN I (HIGH SENSITIVITY) - Abnormal; Notable for the following components:   Troponin I (High Sensitivity) 120 (*)    All other components within normal limits  TROPONIN I (HIGH SENSITIVITY) - Abnormal; Notable for the following components:   Troponin I (High Sensitivity) 205 (*)    All other components within normal limits  RESP PANEL BY RT-PCR (FLU A&B, COVID) ARPGX2  CULTURE, BLOOD (ROUTINE X 2)  CULTURE, BLOOD (ROUTINE X 2)  RAPID URINE DRUG SCREEN, HOSP PERFORMED  CBC WITH DIFFERENTIAL/PLATELET  LACTIC ACID, PLASMA  BRAIN NATRIURETIC PEPTIDE  CK  ACETAMINOPHEN LEVEL  SALICYLATE LEVEL  TRIGLYCERIDES  HIV ANTIBODY (ROUTINE TESTING W REFLEX)  CBC  CREATININE, SERUM  CBC  BASIC METABOLIC PANEL  MAGNESIUM  PHOSPHORUS  BLOOD GAS, ARTERIAL  HEMOGLOBIN A1C  CBG MONITORING, ED  CBG MONITORING, ED  I-STAT ARTERIAL BLOOD GAS, ED  TYPE AND SCREEN  ABO/RH    EKG EKG Interpretation  Date/Time:  Wednesday Jun 25 2020 17:50:36 EDT Ventricular Rate:  98 PR Interval:  134 QRS Duration: 82 QT Interval:  343 QTC Calculation: 438 R Axis:   74 Text Interpretation: Sinus rhythm Ventricular premature complex Low voltage, precordial leads No old tracing to compare Confirmed by Jacalyn Lefevre [phone]) on 06/25/2020 5:52:15 PM  Radiology DG Chest  Portable 1 View  Result Date: 06/25/2020 CLINICAL DATA:  Hypoxia EXAM: PORTABLE CHEST 1 VIEW COMPARISON:  None. FINDINGS: None endotracheal tube tip at or just above the carina. Esophageal tube tip below the diaphragm but incompletely visualized. Low lung volumes. Cardiomegaly. Streaky perihilar consolidations and patchy bilateral foci of airspace disease. Central bronchovascular crowding due to low lung volume. No pneumothorax or definitive effusion IMPRESSION: 1. Endotracheal tube tip at or just above the carina. 2. Low lung volumes. Perihilar consolidations and scattered foci of airspace disease, favor pneumonia. 3. Cardiomegaly Electronically Signed   By: Jasmine PangKim  Fujinaga M.D.   On: 06/25/2020 16:37    Procedures Procedure Name: Intubation Date/Time: 06/25/2020 3:42 PM Performed by: Jacalyn LefevreHaviland, Genevra Orne, MD Oxygen Delivery Method: Ambu bag Preoxygenation: Pre-oxygenation with 100% oxygen Induction Type: Rapid sequence Laryngoscope Size: Glidescope and 4 Tube size: 7.5 mm Number of attempts: 1 Secured at: 24 cm Tube secured with: ETT holder Dental Injury: Teeth and Oropharynx as per pre-operative assessment  Difficulty Due To: Difficulty was anticipated Future Recommendations: Recommend- induction with short-acting agent, and alternative techniques readily available        Medications Ordered in ED Medications  naloxone (NARCAN) 0.4 MG/ML injection (  Not Given 06/25/20 1520)  rocuronium bromide 100 MG/10ML SOSY (  Not Given 06/25/20 1530)  etomidate (AMIDATE) 2 MG/ML injection (  Not Given 06/25/20 1527)  propofol (DIPRIVAN) 1000 MG/100ML infusion (5 mcg/kg/min  150 kg Intravenous New Bag/Given 06/25/20 1545)  fentaNYL (SUBLIMAZE) injection 100 mcg (100 mcg Intravenous Given 06/25/20 1745)  fentaNYL (SUBLIMAZE) injection 100 mcg (has no administration in time range)  azithromycin (ZITHROMAX) 500 mg in sodium chloride 0.9 % 250 mL IVPB (500 mg Intravenous New Bag/Given 06/25/20 1902)  docusate sodium  (COLACE) capsule 100 mg (has no administration in time range)  polyethylene glycol (MIRALAX / GLYCOLAX) packet 17 g (has no administration in time range)  heparin injection 5,000 Units (has no administration in time range)  pantoprazole (PROTONIX) injection 40 mg (has no administration in time range)  lactated ringers bolus 1,000 mL (has no administration in time range)  lactated ringers bolus 1,000 mL (has no administration in time range)  insulin aspart (novoLOG) injection 0-15 Units (has no administration in time range)  Ampicillin-Sulbactam (UNASYN) 3 g in sodium chloride 0.9 % 100 mL IVPB (has no administration in time range)  naloxone (NARCAN) 2 MG/2ML injection (2 mg  Given 06/25/20 1505)  0.9 %  sodium chloride infusion (999 mL/hr Intravenous New Bag/Given 06/25/20 1519)  etomidate (AMIDATE) injection (20 mg Intravenous Given 06/25/20 1527)  succinylcholine (ANECTINE) injection (0 mg Intravenous Hold 06/25/20 1801)  propofol (DIPRIVAN) 1000 MG/100ML infusion (0 mcg/kg/min Intravenous Stopped 06/25/20 1545)  cefTRIAXone (ROCEPHIN) 1 g in sodium chloride 0.9 % 100 mL IVPB (0 g Intravenous Stopped 06/25/20 1859)    ED Course  I have reviewed the triage vital signs and the nursing notes.  Pertinent labs & imaging results that were available during my care of the patient were reviewed by me and considered in my medical decision making (see chart for details).    MDM Rules/Calculators/A&P                          Pt intubated shortly after arrival due to hypoxia.  Pt's CXR suggested pna, so she was given IVFs and IV rocephin/zithromax.  Pt's lactic elevated.  Troponin elevated; likely strain from resp status.    INR and LFTs  are elevated.  She is in renal failure.  No old labs to compare.  UDS neg.  Covid neg.  Pt d/w CCM for admission.    CRITICAL CARE Performed by: Jacalyn Lefevre   Total critical care time: 60 minutes  Critical care time was exclusive of separately billable  procedures and treating other patients.  Critical care was necessary to treat or prevent imminent or life-threatening deterioration.  Critical care was time spent personally by me on the following activities: development of treatment plan with patient and/or surrogate as well as nursing, discussions with consultants, evaluation of patient's response to treatment, examination of patient, obtaining history from patient or surrogate, ordering and performing treatments and interventions, ordering and review of laboratory studies, ordering and review of radiographic studies, pulse oximetry and re-evaluation of patient's condition.  Felicia Warren was evaluated in Emergency Department on 06/25/2020 for the symptoms described in the history of present illness. She was evaluated in the context of the global COVID-19 pandemic, which necessitated consideration that the patient might be at risk for infection with the SARS-CoV-2 virus that causes COVID-19. Institutional protocols and algorithms that pertain to the evaluation of patients at risk for COVID-19 are in a state of rapid change based on information released by regulatory bodies including the CDC and federal and state organizations. These policies and algorithms were followed during the patient's care in the ED. Final Clinical Impression(s) / ED Diagnoses Final diagnoses:  Acute respiratory failure with hypoxia (HCC)  Community acquired pneumonia, unspecified laterality  Acute renal failure, unspecified acute renal failure type (HCC)  Elevated troponin  Elevated LFTs    Rx / DC Orders ED Discharge Orders    None       Jacalyn Lefevre, MD 06/25/20 1918

## 2020-06-25 NOTE — ED Notes (Signed)
Unable to finish triage; pt is confused and no family at bedside.

## 2020-06-25 NOTE — ED Notes (Signed)
[facility] called by Felicia Warren  they are in route to get patient

## 2020-06-25 NOTE — ED Notes (Addendum)
Pt IV infiltrated on L upper arm. IV access reestablished by [facility]. Propofol drip hooked up to new line.

## 2020-06-25 NOTE — Progress Notes (Signed)
RT NOTE: patient intubated by ED MD without complications.  Positive color change noted.  Condensation noted in ETT.  Bilateral breath sounds auscultated.  Chest xray pending.  Will continue to monitor.

## 2020-06-25 NOTE — ED Notes (Signed)
Report given to [facility]. 

## 2020-06-26 ENCOUNTER — Inpatient Hospital Stay (HOSPITAL_COMMUNITY): Payer: Self-pay

## 2020-06-26 ENCOUNTER — Encounter (HOSPITAL_COMMUNITY): Payer: Self-pay

## 2020-06-26 DIAGNOSIS — R7989 Other specified abnormal findings of blood chemistry: Secondary | ICD-10-CM

## 2020-06-26 DIAGNOSIS — J9601 Acute respiratory failure with hypoxia: Secondary | ICD-10-CM

## 2020-06-26 DIAGNOSIS — J9602 Acute respiratory failure with hypercapnia: Secondary | ICD-10-CM

## 2020-06-26 LAB — HEPATIC FUNCTION PANEL
ALT: 912 U/L — ABNORMAL HIGH (ref 0–44)
AST: 2160 U/L — ABNORMAL HIGH (ref 15–41)
Albumin: 3.3 g/dL — ABNORMAL LOW (ref 3.5–5.0)
Alkaline Phosphatase: 88 U/L (ref 38–126)
Bilirubin, Direct: 1.3 mg/dL — ABNORMAL HIGH (ref 0.0–0.2)
Indirect Bilirubin: 1.3 mg/dL — ABNORMAL HIGH (ref 0.3–0.9)
Total Bilirubin: 2.6 mg/dL — ABNORMAL HIGH (ref 0.3–1.2)
Total Protein: 7 g/dL (ref 6.5–8.1)

## 2020-06-26 LAB — DIC (DISSEMINATED INTRAVASCULAR COAGULATION)PANEL
D-Dimer, Quant: >20 ug/mL-FEU — ABNORMAL HIGH (ref 0.00–0.50)
Fibrinogen: 197 mg/dL — ABNORMAL LOW (ref 210–475)
INR: 1.8 — ABNORMAL HIGH (ref 0.8–1.2)
Platelets: 174 10*3/uL (ref 150–400)
Prothrombin Time: 21 seconds — ABNORMAL HIGH (ref 11.4–15.2)
Smear Review: NONE SEEN
aPTT: 30 seconds (ref 24–36)

## 2020-06-26 LAB — GLUCOSE, CAPILLARY
Glucose-Capillary: 116 mg/dL — ABNORMAL HIGH (ref 70–99)
Glucose-Capillary: 80 mg/dL (ref 70–99)
Glucose-Capillary: 87 mg/dL (ref 70–99)
Glucose-Capillary: 89 mg/dL (ref 70–99)
Glucose-Capillary: 93 mg/dL (ref 70–99)
Glucose-Capillary: 95 mg/dL (ref 70–99)
Glucose-Capillary: 98 mg/dL (ref 70–99)

## 2020-06-26 LAB — CBC WITH DIFFERENTIAL/PLATELET
Abs Immature Granulocytes: 0.11 10*3/uL — ABNORMAL HIGH (ref 0.00–0.07)
Basophils Absolute: 0 10*3/uL (ref 0.0–0.1)
Basophils Relative: 0 %
Eosinophils Absolute: 0 10*3/uL (ref 0.0–0.5)
Eosinophils Relative: 0 %
HCT: 41.5 % (ref 36.0–46.0)
Hemoglobin: 11.5 g/dL — ABNORMAL LOW (ref 12.0–15.0)
Immature Granulocytes: 1 %
Lymphocytes Relative: 8 %
Lymphs Abs: 1.1 10*3/uL (ref 0.7–4.0)
MCH: 23.9 pg — ABNORMAL LOW (ref 26.0–34.0)
MCHC: 27.7 g/dL — ABNORMAL LOW (ref 30.0–36.0)
MCV: 86.3 fL (ref 80.0–100.0)
Monocytes Absolute: 0.6 10*3/uL (ref 0.1–1.0)
Monocytes Relative: 4 %
Neutro Abs: 12.4 10*3/uL — ABNORMAL HIGH (ref 1.7–7.7)
Neutrophils Relative %: 87 %
Platelets: 125 10*3/uL — ABNORMAL LOW (ref 150–400)
RBC: 4.81 MIL/uL (ref 3.87–5.11)
RDW: 18.3 % — ABNORMAL HIGH (ref 11.5–15.5)
WBC: 14.3 10*3/uL — ABNORMAL HIGH (ref 4.0–10.5)
nRBC: 1.1 % — ABNORMAL HIGH (ref 0.0–0.2)

## 2020-06-26 LAB — BLOOD GAS, ARTERIAL
Acid-Base Excess: 1.9 mmol/L (ref 0.0–2.0)
Bicarbonate: 26.5 mmol/L (ref 20.0–28.0)
FIO2: 80
O2 Saturation: 95.1 %
Patient temperature: 98.6
pCO2 arterial: 44.1 mmHg (ref 32.0–48.0)
pH, Arterial: 7.396 (ref 7.350–7.450)
pO2, Arterial: 87.4 mmHg (ref 83.0–108.0)

## 2020-06-26 LAB — ACETAMINOPHEN LEVEL
Acetaminophen (Tylenol), Serum: <10 ug/mL — ABNORMAL LOW (ref 10–30)
Acetaminophen (Tylenol), Serum: <10 ug/mL — ABNORMAL LOW (ref 10–30)

## 2020-06-26 LAB — BASIC METABOLIC PANEL
Anion gap: 18 — ABNORMAL HIGH (ref 5–15)
BUN: 46 mg/dL — ABNORMAL HIGH (ref 6–20)
CO2: 26 mmol/L (ref 22–32)
Calcium: 7.4 mg/dL — ABNORMAL LOW (ref 8.9–10.3)
Chloride: 99 mmol/L (ref 98–111)
Creatinine, Ser: 3.83 mg/dL — ABNORMAL HIGH (ref 0.44–1.00)
GFR, Estimated: 14 mL/min — ABNORMAL LOW (ref >60)
Glucose, Bld: 103 mg/dL — ABNORMAL HIGH (ref 70–99)
Potassium: 4.4 mmol/L (ref 3.5–5.1)
Sodium: 143 mmol/L (ref 135–145)

## 2020-06-26 LAB — COOXEMETRY PANEL
Carboxyhemoglobin: 1.3 % (ref 0.5–1.5)
Methemoglobin: 1.7 % — ABNORMAL HIGH (ref 0.0–1.5)
O2 Saturation: 82.9 %
Total hemoglobin: 11 g/dL — ABNORMAL LOW (ref 12.0–16.0)

## 2020-06-26 LAB — TYPE AND SCREEN
ABO/RH(D): O POS
Antibody Screen: NEGATIVE

## 2020-06-26 LAB — TRIGLYCERIDES: Triglycerides: 143 mg/dL (ref <150)

## 2020-06-26 LAB — PHOSPHORUS
Phosphorus: 6.4 mg/dL — ABNORMAL HIGH (ref 2.5–4.6)
Phosphorus: 4.6 mg/dL (ref 2.5–4.6)
Phosphorus: 4.5 mg/dL (ref 2.5–4.6)

## 2020-06-26 LAB — LIPASE, BLOOD: Lipase: 23 U/L (ref 11–51)

## 2020-06-26 LAB — HIV ANTIBODY (ROUTINE TESTING W REFLEX): HIV Screen 4th Generation wRfx: NONREACTIVE

## 2020-06-26 LAB — MAGNESIUM
Magnesium: 2 mg/dL (ref 1.7–2.4)
Magnesium: 2 mg/dL (ref 1.7–2.4)
Magnesium: 2 mg/dL (ref 1.7–2.4)

## 2020-06-26 LAB — MRSA PCR SCREENING: MRSA by PCR: NEGATIVE

## 2020-06-26 LAB — SODIUM, URINE, RANDOM: Sodium, Ur: <10 mmol/L

## 2020-06-26 LAB — SALICYLATE LEVEL: Salicylate Lvl: <7 mg/dL — ABNORMAL LOW (ref 7.0–30.0)

## 2020-06-26 LAB — CREATININE, URINE, RANDOM: Creatinine, Urine: 496.23 mg/dL

## 2020-06-26 LAB — STREP PNEUMONIAE URINARY ANTIGEN: Strep Pneumo Urinary Antigen: NEGATIVE

## 2020-06-26 LAB — ECHOCARDIOGRAM COMPLETE
Area-P 1/2: 2.03 cm2
Height: 66 in
S' Lateral: 2.5 cm
Weight: 7040.61 oz

## 2020-06-26 LAB — PROTIME-INR
INR: 1.9 — ABNORMAL HIGH (ref 0.8–1.2)
Prothrombin Time: 21.3 seconds — ABNORMAL HIGH (ref 11.4–15.2)

## 2020-06-26 LAB — HEMOGLOBIN A1C
Hgb A1c MFr Bld: 6.3 % — ABNORMAL HIGH (ref 4.8–5.6)
Mean Plasma Glucose: 134.11 mg/dL

## 2020-06-26 LAB — CBG MONITORING, ED: Glucose-Capillary: 107 mg/dL — ABNORMAL HIGH (ref 70–99)

## 2020-06-26 LAB — AMMONIA: Ammonia: 38 umol/L — ABNORMAL HIGH (ref 9–35)

## 2020-06-26 LAB — BRAIN NATRIURETIC PEPTIDE: B Natriuretic Peptide: 315.4 pg/mL — ABNORMAL HIGH (ref 0.0–100.0)

## 2020-06-26 MED ORDER — ADULT MULTIVITAMIN LIQUID CH
15.0000 mL | Freq: Every day | ORAL | Status: DC
  Administered 2020-06-26 – 2020-06-29 (×4): 15 mL
  Filled 2020-06-26 (×4): qty 15

## 2020-06-26 MED ORDER — ADULT MULTIVITAMIN W/MINERALS CH: 1.0000 | ORAL_TABLET | Freq: Every day | ORAL | Status: DC

## 2020-06-26 MED ORDER — PROSOURCE TF PO LIQD
90.0000 mL | Freq: Every day | ORAL | Status: DC
  Administered 2020-06-26 – 2020-06-29 (×14): 90 mL
  Filled 2020-06-26 (×12): qty 90

## 2020-06-26 MED ORDER — FUROSEMIDE 10 MG/ML IJ SOLN
100.0000 mg | Freq: Once | INTRAVENOUS | Status: AC
  Administered 2020-06-26: 100 mg via INTRAVENOUS
  Filled 2020-06-26: qty 10

## 2020-06-26 MED ORDER — ALBUMIN HUMAN 5 % IV SOLN
25.0000 g | Freq: Once | INTRAVENOUS | Status: AC
  Administered 2020-06-26: 25 g via INTRAVENOUS
  Filled 2020-06-26: qty 500

## 2020-06-26 MED ORDER — SODIUM CHLORIDE 0.9% FLUSH: 3.0000 mL | INTRAVENOUS | Status: DC | PRN

## 2020-06-26 MED ORDER — PROSOURCE TF PO LIQD: 45.0000 mL | Freq: Two times a day (BID) | ORAL | Status: DC

## 2020-06-26 MED ORDER — VITAL HIGH PROTEIN PO LIQD
1000.0000 mL | ORAL | Status: DC
  Administered 2020-06-26 – 2020-06-28 (×3): 1000 mL

## 2020-06-26 MED ORDER — VITAL HIGH PROTEIN PO LIQD: 1000.0000 mL | ORAL | Status: DC

## 2020-06-26 MED ORDER — SODIUM CHLORIDE 0.9 % IV BOLUS
1000.0000 mL | Freq: Once | INTRAVENOUS | Status: AC
  Administered 2020-06-26: 1000 mL via INTRAVENOUS

## 2020-06-26 MED ORDER — SODIUM CHLORIDE 0.9 % IV SOLN
2.0000 g | INTRAVENOUS | Status: DC
  Administered 2020-06-26 – 2020-06-30 (×5): 2 g via INTRAVENOUS
  Filled 2020-06-26 (×6): qty 20
  Filled 2020-06-26: qty 2

## 2020-06-26 MED ORDER — HEPARIN SODIUM (PORCINE) 5000 UNIT/ML IJ SOLN
5000.0000 [IU] | Freq: Three times a day (TID) | INTRAMUSCULAR | Status: DC
  Administered 2020-06-26 – 2020-06-27 (×3): 5000 [IU] via SUBCUTANEOUS
  Filled 2020-06-26 (×3): qty 1

## 2020-06-26 MED ORDER — SODIUM CHLORIDE 0.9% FLUSH
3.0000 mL | Freq: Two times a day (BID) | INTRAVENOUS | Status: DC
  Administered 2020-06-26 – 2020-06-29 (×7): 3 mL via INTRAVENOUS

## 2020-06-26 MED ORDER — SODIUM CHLORIDE 0.9 % IV SOLN
250.0000 mL | INTRAVENOUS | Status: DC | PRN
  Administered 2020-06-27 – 2020-06-29 (×2): 250 mL via INTRAVENOUS

## 2020-06-26 NOTE — Progress Notes (Addendum)
eLink Physician-Brief Progress Note Patient Name: Felicia Warren DOB: Mar 19, 1970 MRN: [id number]   Date of Service  06/26/2020  HPI/Events of Note  Patient with hypotension and a serum lactic acid level of 4.9, creatinine 3.8, K+ 4.6.  eICU Interventions  1000 ml iv Normal Saline bolus ordered.        Thomasene Lot [name] 06/26/2020, 12:20 AM

## 2020-06-26 NOTE — Procedures (Signed)
Central Venous Catheter Insertion Procedure Note  Brookelyn Gillian  [id number]  08/26/1970  Date:06/26/20  Time:1:52 PM   Provider Performing:Brooke Lodema Hong   Procedure: Insertion of Non-tunneled Central Venous [phone]) with US guidance ([id number])   Indication(s) Difficult access  Consent Unable to obtain consent due to inability to find a medical decision maker for patient.  All reasonable efforts were made.  Another independent medical provider, Dr. Kendrick Fries , confirmed the benefits of this procedure outweigh the risks.  Anesthesia Topical only with 1% lidocaine   Timeout Verified patient identification, verified procedure, site/side was marked, verified correct patient position, special equipment/implants available, medications/allergies/relevant history reviewed, required imaging and test results available.  Sterile Technique Maximal sterile technique including full sterile barrier drape, hand hygiene, sterile gown, sterile gloves, mask, hair covering, sterile ultrasound probe cover (if used).  Procedure Description Area of catheter insertion was cleaned with chlorhexidine and draped in sterile fashion.  With real-time ultrasound guidance a central venous catheter was placed into the right internal jugular vein to 16 cm. Nonpulsatile blood flow and easy flushing noted in all ports.  The catheter was sutured in place, biopatch placed, and sterile dressing applied.      Complications/Tolerance None; patient tolerated the procedure well. Chest X-ray is ordered to verify placement for internal jugular or subclavian cannulation.   Chest x-ray is not ordered for femoral cannulation.  EBL Minimal  Specimen(s) None     Posey Boyer, ACNP [facility] 06/26/2020, 1:53 PM

## 2020-06-26 NOTE — Progress Notes (Signed)
NAME:  Felicia Warren, MRN:  [id number], DOB:  10-18-1970, LOS: 1 ADMISSION DATE:  06/25/2020, CONSULTATION DATE:  06/26/20 REFERRING MD:  EDP, CHIEF COMPLAINT:  AMS   History of Present Illness:   50 y/o Female who presented initially as a Felicia Warren, found with altered mental status in a motel by staff.  It is unknown when her last known well was.  EMS found patient awake, but altered glucose was initially 40.  This was corrected, but she continued to have worsening mental status.  Narcan did not provide any response.  In ED she required nonrebreather mask and eventual intubation for hypoxic respiratory failure.  Chest x-ray with probable multifocal pneumonia.  Labs partially completed at the time of admission, but notable for white blood cell count of 15 K, creatinine 3 and INR of 2.1.  Metabolic panel, ABG, troponin, lactic acid CT head all pending.  At the time of exam, patient was sleeping while sedated with propofol, however was arousable to sternal rub and following commands.  No prior history in epic and no known emergency contact.  Pertinent  Medical History  Obesity,?  OSA  Significant Hospital Events: Including procedures, antibiotic start and stop dates in addition to other pertinent events   . 5/4 presented to ED with hypoglycemia and AMS, required intubation, PCCM consult. CXR concerning for PNA.  Unasyn initiated.  tx to [facility] for beds  Interim History / Subjective:  Hypotensive overnight, responded to IVF/ albumin plt 320-> 125, heparin d/c and HIT panel ordered Propofol 15 mcg/min, following simple commands Not on pressors UOP 85 since admit, sCr 3.5-> 3.83 Remains on FiO2 80/ 10 peep  Objective   Blood pressure (!) 131/54, pulse 69, temperature 97.6 F (36.4 C), temperature source Axillary, resp. rate 19, height 5\' 6"  (1.676 m), weight (!) 199.6 kg, SpO2 98 %.    Vent Mode: PRVC FiO2 (%):  [80 %-100 %] 80 % Set Rate:  [18 bmp-24 bmp] 24 bmp Vt Set:  [470 mL] 470 mL PEEP:   [5 cmH20-10 cmH20] 10 cmH20 Plateau Pressure:  [28 cmH20-31 cmH20] 28 cmH20   Intake/Output Summary (Last 24 hours) at 06/26/2020 0735 Last data filed at 06/26/2020 0600 Gross per 24 hour  Intake 1264.55 ml  Output 585 ml  Net 679.55 ml   Filed Weights   06/25/20 1535 06/26/20 0149  Weight: (!) 190.5 kg (!) 199.6 kg    General:  Critically ill morbidly obese female comfortable in NAD HEENT: MM pink/moist, ETT 7.5 at 23, OGT, pupils 3/reactive Neuro: awakens to loud stimuli, follows simple commands in all extremities CV: NSR, rr, no murmur PULM:  Non labored, coarse t/o, remains on  GI: obese, soft, NT, bs, foley  Extremities: warm/dry, +1LE edema  Skin: no rashes, area to LUE w/erythema 2/2 infiltrated IV  Labs/imaging that I havepersonally reviewed  (right click and "Reselect all SmartList Selections" daily)  CBC, BMP, trop 124 UDS neg, neg tylenol/ salicylates CTH neg Lactic acid 5.9-> 4.9  Resolved Hospital Problem list     Assessment & Plan:   Altered mental status secondary to hypoglycemia and likely hypercapnic Respiratory failure Unknown past medical history, but would suspect some element of obesity hypoventilation syndrome or OSA P: - following simple commands this morning on low dose propofol - continue PAD with propofol/ prn fentanyl for RASS goal 0/-1 - neg UDS/ tylenol/ salicylates  - ammonia 38- repeat in am  - d/c SSI- monitor glucose closely  - Maintain neuro protective measures;  goal for eurothermia, euglycemia, eunatermia, normoxia, and PCO2 goal of 35-40  Acute hypoxic respiratory failure year and likely CAP versus aspiration Bilious emesis OG tube P: -Continue MV support, 8cc/kg IBW with goal Pplat <30 and DP<15, rate 24 - wean PEEP/ FiO2 as able for SpO2 goal > 92% -VAP prevention protocol/ PPI -PAD protocol for sedation> propofol/ prn fentanyl  -daily SAT & SBT- when appropriate - will send trach asp, urine leg and strep - CXR this am -  continue unasyn for now  - hold on diuresis given hypotension  AKI, undetermined acuity AGMA/ lactic acidosis  No baseline creatinine for comparison, minimal urine output in the ED s/p 2L IV bolus - no UOP overnight P: - renal ultrasound and urine lytes - strict I/Os - no current indications for iHD  - continue supportive care/ MAP > 65, s/p albumin, hold on further fluids given high FiO2/ PEEP requirements   Hypotension, etiology likely severe sepsis - fluid responsive overnight  - continue MAP goal > 65 - lactate slow to clear but expected 2/2 liver dysfunction - continue unasyn as above for PNA - follow cultures  - check TTE and trend troponin to rule out cardiogenic component given unknown hx  Elevated troponin  - likely demand, trop hs 120-205 - repeat trop hs and EKG - TTE  Elevated liver enzymes - unclear etiology vs shock vs ? HF - repeat LFTs this am, coags, and lipase - RUQ [place], consider hepatitis panel  - trend ammonia, 38 today  - monitor glucose closely for s/s of hypoglycemia   Thrombocytopenia  - unclear etiology.  320-> 125, heparin sq stopped and HIT panel sent.  Would normally not fit time frame for HIT unless prior allergy to heparin and we have no prior information on her.  ddx could be 2/2 developing SIRS/ sepsis vs liver dysfunction - no current evidence of bleeding, H/H stable - will trend CBC, if progressive send further thrombocytopenia workup  - await HIT panel sent 5/5 - SCDs  Best practice (right click and "Reselect all SmartList Selections" daily)  Diet:  NPO, start TF Pain/Anxiety/Delirium protocol (if indicated): Yes (RASS goal +1) VAP protocol (if indicated): Yes DVT prophylaxis: SCD GI prophylaxis: PPI Glucose control:  SSI Yes Central venous access:  N/A Arterial line:  N/A Foley:  Yes, and it is still needed Mobility:  bed rest  PT consulted: N/A Last date of multidisciplinary goals of care discussion [pending] Code Status:  full  code Disposition: ICU  TOC consulted as we have no contacts for her.    Labs   CBC: Recent Labs  Lab 06/25/20 1524 06/25/20 1715 06/25/20 2043 06/26/20 0320  WBC  --  15.6*  --  14.3*  NEUTROABS  --  13.2*  --  12.4*  HGB 17.0* 12.0 15.3* 11.5*  HCT 50.0* 44.1 45.0 41.5  MCV  --  90.2  --  86.3  PLT  --  320  --  125*    Basic Metabolic Panel: Recent Labs  Lab 06/25/20 1515 06/25/20 1524 06/25/20 2043 06/25/20 2156 06/26/20 0320  NA 140 140 141  --  143  K 4.6 4.5 4.3  --  4.4  CL 96* 99  --   --  99  CO2 20*  --   --   --  26  GLUCOSE 76 83  --   --  103*  BUN 41* 44*  --   --  46*  CREATININE 3.83* 3.50*  --  3.62* 3.83*  CALCIUM 8.1*  --   --   --  7.4*  MG  --   --   --   --  2.0  PHOS  --   --   --   --  6.4*   GFR: Estimated Creatinine Clearance: 31.7 mL/min (A) (by C-G formula based on SCr of 3.83 mg/dL (H)). Recent Labs  Lab 06/25/20 1715 06/25/20 1720 06/25/20 2243 06/26/20 0320  WBC 15.6*  --   --  14.3*  LATICACIDVEN  --  5.9* 4.9*  --     Liver Function Tests: Recent Labs  Lab 06/25/20 1515  AST 762*  ALT 346*  ALKPHOS 110  BILITOT 4.5*  PROT 7.9  ALBUMIN 3.6   No results for input(s): LIPASE, AMYLASE in the last 168 hours. Recent Labs  Lab 06/26/20 0320  AMMONIA 38*    ABG    Component Value Date/Time   PHART 7.396 06/26/2020 0449   PCO2ART 44.1 06/26/2020 0449   PO2ART 87.4 06/26/2020 0449   HCO3 26.5 06/26/2020 0449   TCO2 35 (H) 06/25/2020 2043   O2SAT 95.1 06/26/2020 0449     Coagulation Profile: Recent Labs  Lab 06/25/20 1715  INR 2.1*    Cardiac Enzymes: Recent Labs  Lab 06/25/20 2156  CKTOTAL 124    HbA1C: No results found for: HGBA1C  CBG: Recent Labs  Lab 06/25/20 1447 06/25/20 2218 06/26/20 0033 06/26/20 0338  GLUCAP 84 95 98 80    CCT: 40 mins     Posey Boyer, ACNP [facility] 06/26/2020, 7:35 AM

## 2020-06-26 NOTE — Progress Notes (Signed)
PHARMACY - PHYSICIAN COMMUNICATION CRITICAL VALUE ALERT - BLOOD CULTURE IDENTIFICATION (BCID)  Felicia Warren is an 50 y.o. female who presented to [facility] on 06/25/2020 with a chief complaint of altered mental status, pneumonia, and acute respiratory failure requiring intubation.  Assessment:  BCx: 1/4 bottles GP rods.  No BCID performed.LA 4.9, but also has AKI. Afebrile, WBC 14.  Given one time doses of ceftriaxone and azithromycin in ED, now on Unasyn for CAP or aspiration  Name of physician (or Provider) Contacted: Posey Boyer NP  Current antibiotics: Unasyn 3g IV q12h   Changes to prescribed antibiotics recommended:  Patient is on recommended antibiotics - No changes needed  No results found for this or any previous visit.  Lynann Beaver PharmD, BCPS Clinical Pharmacist [facility] main pharmacy [phone] 06/26/2020  8:09 AM

## 2020-06-26 NOTE — Progress Notes (Signed)
eLink Physician-Brief Progress Note Patient Name: Felicia Warren DOB: 1970-02-24 MRN: [id number]   Date of Service  06/26/2020  HPI/Events of Note  Patient dropped her platelet count from 320 K to 125 K, she was started on Heparin, she's also been oliguric despite a recent 100 ml iv fluid bolus.  eICU Interventions  Heparin discontinued and HIT panel ordered, will give a 500 ml Albumin bolus since patient has had 3 liters of crystalloid already.        Thomasene Lot Jacen Carlini 06/26/2020, 5:14 AM

## 2020-06-26 NOTE — Progress Notes (Addendum)
   PCCM progress  Discussed with Dr. Kendrick Fries, reviewed labs, DIC panel with no schistocytes.  plts improved since am labs.  Thrombocytopenia does not fit timing with HIT (however unknown prior hx).  Will restart heparin sq for VTE.  Will also check LE dopplers, doubtful for PE, more suspicious for acute RV failure with hepatic congestion and dysfuntion.   Will change aspiration coverage from unasyn to ceftriaxone given salt content in the setting of acute cor pulmonale.   Cardiology consulted as well and will see 5/6 am.      Posey Boyer, ACNP [place] 06/26/2020, 5:54 PM

## 2020-06-26 NOTE — Progress Notes (Signed)
  Echocardiogram 2D Echocardiogram has been performed.  Augustine Radar 06/26/2020, 3:52 PM

## 2020-06-26 NOTE — Progress Notes (Signed)
[facility] PCCM  Echocardiogram read> severe RV dysfunction coox negative Continue diuresis Will need RHC after adequate diuresis Continue current management  Heber Gladwin, MD [place] [facility] Pager: [phone] Cell: [phone] If no response, please call [phone] until 7pm After 7:00 pm call Elink  [phone]

## 2020-06-26 NOTE — Progress Notes (Signed)
Initial Nutrition Assessment  DOCUMENTATION CODES:   Morbid obesity  INTERVENTION:  - will order Vital High Protein @ 20 ml/hr with 90 ml Prosource TF x5/day. - this regimen + kcal from current propofol rate will provide 2068 kcal, 152 grams protein, and 401 ml free water.  - free water flush, if desired, to be per CCM.    NUTRITION DIAGNOSIS:   Inadequate oral intake related to inability to eat as evidenced by NPO status.  GOAL:   Provide needs based on ASPEN/SCCM guidelines  MONITOR:   Vent status,TF tolerance,Labs,Weight trends  REASON FOR ASSESSMENT:   Ventilator,Consult Enteral/tube feeding initiation and management  ASSESSMENT:   50 year-old female who presented as a [name], found with AMS in a motel by staff.  It is unknown when her last known well was.  EMS found patient awake by CBG of 40 mg/dl.  This was corrected, but she continued to have worsening mental status; Narcan did not provide any response.  In ED she required NRB mask and eventual intubation for hypoxic respiratory failure.  CXR with probable multifocal PNA.  Patient remains intubated with OGT in place. No family/visitors present.   Weight today is 440 lb and no weight record from PTA is available at this time.   Per notes: - CAP vs aspiration - AMS 2/2 hypoglycemia - AKI   Patient is currently intubated on ventilator support MV: 10.2 L/min Temp (24hrs), Avg:98.2 F (36.8 C), Min:97.2 F (36.2 C), Max:98.7 F (37.1 C) Propofol: 45 ml/hr (1188 kcal) BP: 105/71 and MAP: 78  Labs reviewed; CBGs: 80 and 95 mg/dl, BUN: 46 mg/dl, creatinine: 3.01 mg/dl, Ca: 7.4 mg/dl, Phos: 6.4 mg/dl, GFR: 14 ml/min. Medications reviewed; 40 mg IV protonix/day.    NUTRITION - FOCUSED PHYSICAL EXAM:  completed; no muscle or fat depletions, mild edema to BLE.  Diet Order:   Diet Order            Diet NPO time specified  Diet effective now                 EDUCATION NEEDS:   No education needs have  been identified at this time  Skin:  Skin Assessment: Reviewed RN Assessment  Last BM:  PTA/unknown  Height:   Ht Readings from Last 1 Encounters:  06/25/20 5\' 6"  (1.676 m)    Weight:   Wt Readings from Last 1 Encounters:  06/26/20 (!) 199.6 kg    Estimated Nutritional Needs:  Kcal:  1300-1477 kcal Protein:  >/= 148 grams Fluid:  >/= 2 L/day      08/26/20, MS, RD, LDN, CNSC Inpatient Clinical Dietitian RD pager # available in [facility]  After hours/weekend pager # available in [facility]

## 2020-06-27 ENCOUNTER — Inpatient Hospital Stay (HOSPITAL_COMMUNITY): Payer: Self-pay

## 2020-06-27 DIAGNOSIS — R609 Edema, unspecified: Secondary | ICD-10-CM

## 2020-06-27 DIAGNOSIS — J9602 Acute respiratory failure with hypercapnia: Secondary | ICD-10-CM

## 2020-06-27 DIAGNOSIS — R778 Other specified abnormalities of plasma proteins: Secondary | ICD-10-CM

## 2020-06-27 DIAGNOSIS — I2609 Other pulmonary embolism with acute cor pulmonale: Secondary | ICD-10-CM

## 2020-06-27 LAB — CBC WITH DIFFERENTIAL/PLATELET
Abs Immature Granulocytes: 0.12 10*3/uL — ABNORMAL HIGH (ref 0.00–0.07)
Basophils Absolute: 0 10*3/uL (ref 0.0–0.1)
Basophils Relative: 0 %
Eosinophils Absolute: 0 10*3/uL (ref 0.0–0.5)
Eosinophils Relative: 0 %
HCT: 38.8 % (ref 36.0–46.0)
Hemoglobin: 11.5 g/dL — ABNORMAL LOW (ref 12.0–15.0)
Immature Granulocytes: 1 %
Lymphocytes Relative: 8 %
Lymphs Abs: 0.8 10*3/uL (ref 0.7–4.0)
MCH: 24.5 pg — ABNORMAL LOW (ref 26.0–34.0)
MCHC: 29.6 g/dL — ABNORMAL LOW (ref 30.0–36.0)
MCV: 82.6 fL (ref 80.0–100.0)
Monocytes Absolute: 0.4 10*3/uL (ref 0.1–1.0)
Monocytes Relative: 4 %
Neutro Abs: 8.5 10*3/uL — ABNORMAL HIGH (ref 1.7–7.7)
Neutrophils Relative %: 87 %
Platelets: 173 10*3/uL (ref 150–400)
RBC: 4.7 MIL/uL (ref 3.87–5.11)
RDW: 18.1 % — ABNORMAL HIGH (ref 11.5–15.5)
WBC: 9.8 10*3/uL (ref 4.0–10.5)
nRBC: 0.5 % — ABNORMAL HIGH (ref 0.0–0.2)

## 2020-06-27 LAB — COOXEMETRY PANEL
Carboxyhemoglobin: 1.4 % (ref 0.5–1.5)
Methemoglobin: 1.7 % — ABNORMAL HIGH (ref 0.0–1.5)
O2 Saturation: 86.7 %
Total hemoglobin: 11.8 g/dL — ABNORMAL LOW (ref 12.0–16.0)

## 2020-06-27 LAB — TROPONIN I (HIGH SENSITIVITY): Troponin I (High Sensitivity): 368 ng/L (ref <18)

## 2020-06-27 LAB — D-DIMER, QUANTITATIVE: D-Dimer, Quant: >20 ug/mL-FEU — ABNORMAL HIGH (ref 0.00–0.50)

## 2020-06-27 LAB — COMPREHENSIVE METABOLIC PANEL
ALT: 757 U/L — ABNORMAL HIGH (ref 0–44)
AST: 1003 U/L — ABNORMAL HIGH (ref 15–41)
Albumin: 3.4 g/dL — ABNORMAL LOW (ref 3.5–5.0)
Alkaline Phosphatase: 86 U/L (ref 38–126)
Anion gap: 18 — ABNORMAL HIGH (ref 5–15)
BUN: 61 mg/dL — ABNORMAL HIGH (ref 6–20)
CO2: 26 mmol/L (ref 22–32)
Calcium: 7.7 mg/dL — ABNORMAL LOW (ref 8.9–10.3)
Chloride: 99 mmol/L (ref 98–111)
Creatinine, Ser: 3.57 mg/dL — ABNORMAL HIGH (ref 0.44–1.00)
GFR, Estimated: 15 mL/min — ABNORMAL LOW (ref >60)
Glucose, Bld: 125 mg/dL — ABNORMAL HIGH (ref 70–99)
Potassium: 3.1 mmol/L — ABNORMAL LOW (ref 3.5–5.1)
Sodium: 143 mmol/L (ref 135–145)
Total Bilirubin: 1.7 mg/dL — ABNORMAL HIGH (ref 0.3–1.2)
Total Protein: 6.6 g/dL (ref 6.5–8.1)

## 2020-06-27 LAB — GLUCOSE, CAPILLARY
Glucose-Capillary: 115 mg/dL — ABNORMAL HIGH (ref 70–99)
Glucose-Capillary: 103 mg/dL — ABNORMAL HIGH (ref 70–99)
Glucose-Capillary: 114 mg/dL — ABNORMAL HIGH (ref 70–99)
Glucose-Capillary: 118 mg/dL — ABNORMAL HIGH (ref 70–99)
Glucose-Capillary: 102 mg/dL — ABNORMAL HIGH (ref 70–99)

## 2020-06-27 LAB — HEPARIN INDUCED PLATELET AB (HIT ANTIBODY): Heparin Induced Plt Ab: 0.092 OD (ref 0.000–0.400)

## 2020-06-27 LAB — MAGNESIUM: Magnesium: 1.9 mg/dL (ref 1.7–2.4)

## 2020-06-27 LAB — AMMONIA: Ammonia: 11 umol/L (ref 9–35)

## 2020-06-27 LAB — HEPATITIS PANEL, ACUTE
HCV Ab: NONREACTIVE
Hep A IgM: NONREACTIVE
Hep B C IgM: NONREACTIVE
Hepatitis B Surface Ag: NONREACTIVE

## 2020-06-27 LAB — AMYLASE: Amylase: 75 U/L (ref 28–100)

## 2020-06-27 LAB — LEGIONELLA PNEUMOPHILA SEROGP 1 UR AG: L. pneumophila Serogp 1 Ur Ag: NEGATIVE

## 2020-06-27 LAB — CK TOTAL AND CKMB (NOT AT ARMC)
CK, MB: 4.9 ng/mL (ref 0.5–5.0)
Relative Index: 3.2 — ABNORMAL HIGH (ref 0.0–2.5)
Total CK: 153 U/L (ref 38–234)

## 2020-06-27 LAB — PHOSPHORUS: Phosphorus: 4 mg/dL (ref 2.5–4.6)

## 2020-06-27 LAB — LIPASE, BLOOD: Lipase: 64 U/L — ABNORMAL HIGH (ref 11–51)

## 2020-06-27 LAB — PROCALCITONIN: Procalcitonin: 0.38 ng/mL

## 2020-06-27 LAB — LACTIC ACID, PLASMA: Lactic Acid, Venous: 1.7 mmol/L (ref 0.5–1.9)

## 2020-06-27 MED ORDER — MAGNESIUM SULFATE IN D5W 1-5 GM/100ML-% IV SOLN
1.0000 g | Freq: Once | INTRAVENOUS | Status: AC
  Administered 2020-06-27: 1 g via INTRAVENOUS
  Filled 2020-06-27: qty 100

## 2020-06-27 MED ORDER — POTASSIUM CHLORIDE 10 MEQ/100ML IV SOLN
10.0000 meq | INTRAVENOUS | Status: AC
  Administered 2020-06-27 (×3): 10 meq via INTRAVENOUS
  Filled 2020-06-27 (×3): qty 100

## 2020-06-27 MED ORDER — LACTATED RINGERS IV SOLN: INTRAVENOUS | Status: DC

## 2020-06-27 MED ORDER — HEPARIN (PORCINE) 25000 UT/250ML-% IV SOLN
1700.0000 [IU]/h | INTRAVENOUS | Status: DC
  Administered 2020-06-27: 2100 [IU]/h via INTRAVENOUS
  Administered 2020-06-28: 1700 [IU]/h via INTRAVENOUS
  Administered 2020-06-28: 2100 [IU]/h via INTRAVENOUS
  Administered 2020-06-29 – 2020-07-01 (×4): 1700 [IU]/h via INTRAVENOUS
  Filled 2020-06-27 (×7): qty 250

## 2020-06-27 MED ORDER — HEPARIN BOLUS VIA INFUSION
4000.0000 [IU] | Freq: Once | INTRAVENOUS | Status: AC
  Administered 2020-06-27: 4000 [IU] via INTRAVENOUS
  Filled 2020-06-27: qty 4000

## 2020-06-27 MED ORDER — LACTATED RINGERS IV BOLUS
2000.0000 mL | Freq: Once | INTRAVENOUS | Status: AC
  Administered 2020-06-27: 2000 mL via INTRAVENOUS

## 2020-06-27 NOTE — TOC Initial Note (Signed)
Transition of Care [facility]) - Initial/Assessment Note    Patient Details  Name: Felicia Warren MRN: [id number] Date of Birth: [birth year]  Transition of Care [facility]) CM/SW Contact:    Golda Acre, RN Phone Number: 06/27/2020, 8:54 AM  Clinical Narrative:                 50 year old morbidly obese female who was found unresponsive, brought in the emergency department requiring endotracheal intubation.    Physical exam: General: Acute on chronically ill-appearing female, orally intubated  HEENT: [place]/AT, eyes anicteric. Severely dry mucous membranes ETT and OGT in place Neuro: Opens eyes with vocal stimuli, following simple commands, moving all 4 extremities  Chest: Reduced air entry at the bases bilaterally, no wheezes or rhonchi  Heart: Regular rate and rhythm, no murmurs or gallops  Abdomen: Soft, nontender, nondistended, bowel sounds present  Skin: No rash    Labs and images were reviewed  Assessment plan: Acute metabolic encephalopathy likely due to CO2 narcosis Acute hypoxic/hypercapnic respiratory failure likely due to obesity hypoventilation syndrome Acute renal failure Severe dehydration Morbid obesity Lactic acidosis  Continue lung protective ventilation, ventilator settings were adjusted to help clear hypercapnia Minimize sedation with RASS goal -1/-2 1 L IV fluid bolus Trend lactate and serum creatinine Nutritionist follow-up   PLAN: will see if there is a next of kin when patient is more stable. 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 Homeless found in hotel room down.  Expected Discharge Plan: Homeless Shelter Barriers to Discharge: Continued Medical Work up   Patient Goals and CMS Choice Patient states their goals for this hospitalization and ongoing recovery are:: on the vent unable to state CMS Medicare.gov Compare Post Acute Care list provided to:: Patient    Expected Discharge Plan and Services Expected Discharge Plan: Homeless Shelter       Living  arrangements for the past 2 months: Hotel/Motel                                      Prior Living Arrangements/Services Living arrangements for the past 2 months: Hotel/Motel Lives with:: Self                   Activities of Daily Living      Permission Sought/Granted                  Emotional Assessment   Attitude/Demeanor/Rapport: Unable to Assess Affect (typically observed): Unable to Assess   Alcohol / Substance Use: Not Applicable Psych Involvement: No (comment)  Admission diagnosis:  Elevated troponin [R77.8] Elevated LFTs [R79.89] Acute respiratory failure with hypoxia (HCC) [J96.01] Acute renal failure, unspecified acute renal failure type (HCC) [N17.9] Hypercapnic respiratory failure (HCC) [J96.92] Community acquired pneumonia, unspecified laterality [J18.9] Patient Active Problem List   Diagnosis Date Noted  . Elevated LFTs   . Hypercapnic respiratory failure (HCC) 06/25/2020  . AKI (acute kidney injury) (HCC)   . Acute respiratory failure with hypoxia (HCC)   . Community acquired pneumonia    PCP:  No primary care provider on file. Pharmacy:  No Pharmacies Listed    Social Determinants of Health (SDOH) Interventions    Readmission Risk Interventions No flowsheet data found.

## 2020-06-27 NOTE — Progress Notes (Signed)
BLE venous duplex has been completed.  Preliminary results given to Leotis Shames, RN.  Results can be found under chart review under CV PROC. 06/27/2020 9:11 AM Shakela Donati RVT, RDMS

## 2020-06-27 NOTE — Consult Note (Signed)
Cardiology Consultation:   Patient ID: Felicia Warren MRN: [id number]; DOB: 1970/10/02  Admit date: 06/25/2020 Date of Consult: 06/27/2020  PCP:  No primary care provider on file.   [facility] Providers Cardiologist:  New  Patient Profile:   Felicia Warren is a 50 y.o. female with unknown medical history who is being seen 06/27/2020 for the evaluation of RV failure at the request of Dr. Lake Bells.   History of Present Illness:   Felicia Warren brought by EMS for altered mental status.  She was found on the bed by staff and eventually called EMS where patient was awake but with altered mental status.  Initially glucose was 40.  No improvement despite Narcan.  In emergency room patient required intubation given acute hypoxic respiratory failure.  Chest x-ray with probable multifocal pneumonia and creatinine of 3.  Treated with broad-spectrum antibiotic for possible aspiration pneumonia.  There was concern for cardiogenic shock and shocky liver.  Central line placed yesterday.  Coox negative.  Echocardiogram showed LV function of 55 to 60%, no regional wall motion abnormality findings consistent with RV volume overload with McConnell's Sign. Normal RV function at base.  Pending lower extremity venous Doppler.  Right upper quadrant ultrasound without evidence of acute cholecystitis.  Net I & O +1.2L since admit and +611cc yesterday.  K 3.1 D-dimer > 20 BNP 315 LFTS elevated but improving  Hgb A1c 6.3   Ref. Range 06/25/2020 15:15 06/25/2020 15:24 06/25/2020 21:56 06/26/2020 03:20 06/27/2020 04:44  Creatinine Latest Ref Range: 0.44 - 1.00 mg/dL 3.83 (H) 3.50 (H) 3.62 (H) 3.83 (H) 3.57 (H)     Ref. Range 06/25/2020 15:24 06/25/2020 17:15 06/25/2020 20:43 06/26/2020 03:20 06/27/2020 04:44  Hemoglobin Latest Ref Range: 12.0 - 15.0 g/dL 17.0 (H) 12.0 15.3 (H) 11.5 (L) 11.5 (L)     Ref. Range 06/25/2020 17:15 06/26/2020 03:20 06/26/2020 14:29 06/27/2020 04:44  Platelets Latest Ref Range: 150 - 400 K/uL 320 125 (L) 174 173     No past medical history on file.  Inpatient Medications: Scheduled Meds: . chlorhexidine gluconate (MEDLINE KIT)  15 mL Mouth Rinse BID  . Chlorhexidine Gluconate Cloth  6 each Topical Daily  . feeding supplement (PROSource TF)  90 mL Per Tube 5 X Daily  . feeding supplement (VITAL HIGH PROTEIN)  1,000 mL Per Tube Q24H  . heparin injection (subcutaneous)  5,000 Units Subcutaneous Q8H  . mouth rinse  15 mL Mouth Rinse 10 times per day  . multivitamin  15 mL Per Tube Daily  . pantoprazole (PROTONIX) IV  40 mg Intravenous QHS  . sodium chloride flush  3 mL Intravenous Q12H   Continuous Infusions: . sodium chloride    . cefTRIAXone (ROCEPHIN)  IV Stopped (06/26/20 2125)  . lactated ringers    . lactated ringers    . propofol (DIPRIVAN) infusion 30 mcg/kg/min (06/27/20 0721)   PRN Meds: sodium chloride, docusate sodium, fentaNYL (SUBLIMAZE) injection, polyethylene glycol, sodium chloride flush  Allergies:    Allergies  Allergen Reactions  . 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 Marital status: Not on file    Spouse name: Not on file  . Number of children: Not on file  . Years of education: Not on file  . Highest education level: Not on file  Occupational History  . Not on file  Tobacco Use  . Smoking status: Not on file  . Smokeless tobacco: Not on file  Substance  and Sexual Activity  . Alcohol use: Not on file  . Drug use: Not on file  . Sexual activity: Not on file  Other Topics Concern  . Not on file  Social History Narrative  . Not on file   Social Determinants of Health   Financial Resource Strain: Not on file  Food Insecurity: Not on file  Transportation Needs: Not on file  Physical Activity: Not on file  Stress: Not on file  Social Connections: Not on file  Intimate Partner Violence: Not on file    Family History:    Patient is intubated   ROS:  Please see the history of present illness.  All other  ROS reviewed and negative.     Physical Exam/Data:   Vitals:   06/27/20 0400 06/27/20 0500 06/27/20 0600 06/27/20 0700  BP: (!) 144/84 (!) 143/61 125/68 139/68  Pulse:      Resp: (!) 24 (!) 24 (!) 24 (!) 24  Temp: (!) 97 F (36.1 C)     TempSrc: Axillary     SpO2: 97% 99% 100%   Weight:  (!) 201.1 kg    Height:        Intake/Output Summary (Last 24 hours) at 06/27/2020 0809 Last data filed at 06/27/2020 0658 Gross per 24 hour  Intake 1415.96 ml  Output 805 ml  Net 610.96 ml   Last 3 Weights 06/27/2020 06/26/2020 06/25/2020  Weight (lbs) 443 lb 5.5 oz 440 lb 0.6 oz 420 lb  Weight (kg) 201.1 kg 199.6 kg 190.511 kg     Body mass index is 71.56 kg/m.  General:  Morbidly obese female who is intubated and sedated  HEENT: normal Lymph: no adenopathy Neck:  JVD difficult assess due to body habituate Endocrine:  No thryomegaly Vascular: No carotid bruits; FA pulses 2+ bilaterally without bruits  Cardiac:  normal S1, S2; RRR; no murmur  Lungs:  clear to auscultation bilaterally, no wheezing, rhonchi or rales  Abd: soft, nontender, no hepatomegaly  Ext: no edema Musculoskeletal:  No deformities Skin: warm and dry  Neuro:  intubated Psych:  intubated  EKG:  The EKG was personally reviewed and demonstrates:  SR at rate of 98 bpm Telemetry:  Telemetry was personally reviewed and demonstrates:  SR at 70s, brief SVT  Relevant CV Studies:  Echo 06/26/2020 IMPRESSIONS   1. Left ventricular ejection fraction, by estimation, is 55 to 60%. The  left ventricle has normal function. The left ventricle has no regional  wall motion abnormalities. Left ventricular diastolic parameters were  normal. There is the interventricular  septum is flattened in systole and diastole, consistent with right  ventricular pressure and volume overload.  2. McConnell's Sign noted. Right ventricular systolic function is normal  in the base. The right ventricular size is moderately enlarged.  3. The mitral  valve is grossly normal. No evidence of mitral valve  regurgitation.  4. The aortic valve is tricuspid. Aortic valve regurgitation is not  visualized. No aortic stenosis is present.  5. There is mild dilatation of the ascending aorta, measuring 41 mm.   Comparison(s): No prior Echocardiogram. Discussed finding with critical  care team.   Laboratory Data:  High Sensitivity Troponin:   Recent Labs  Lab 06/25/20 1515 06/25/20 1715  TROPONINIHS 120* 205*     Chemistry Recent Labs  Lab 06/25/20 1515 06/25/20 1524 06/25/20 2043 06/25/20 2156 06/26/20 0320 06/27/20 0444  NA 140 140 141  --  143 143  K 4.6 4.5 4.3  --  4.4 3.1*  CL 96* 99  --   --  99 99  CO2 20*  --   --   --  26 26  GLUCOSE 76 83  --   --  103* 125*  BUN 41* 44*  --   --  46* 61*  CREATININE 3.83* 3.50*  --  3.62* 3.83* 3.57*  CALCIUM 8.1*  --   --   --  7.4* 7.7*  GFRNONAA 14*  --   --  15* 14* 15*  ANIONGAP 24*  --   --   --  18* 18*    Recent Labs  Lab 06/25/20 1515 06/26/20 0959 06/27/20 0444  PROT 7.9 7.0 6.6  ALBUMIN 3.6 3.3* 3.4*  AST 762* 2,160* 1,003*  ALT 346* 912* 757*  ALKPHOS 110 88 86  BILITOT 4.5* 2.6* 1.7*   Hematology Recent Labs  Lab 06/25/20 1715 06/25/20 2043 06/26/20 0320 06/26/20 1429 06/27/20 0444  WBC 15.6*  --  14.3*  --  9.8  RBC 4.89  --  4.81  --  4.70  HGB 12.0 15.3* 11.5*  --  11.5*  HCT 44.1 45.0 41.5  --  38.8  MCV 90.2  --  86.3  --  82.6  MCH 24.5*  --  23.9*  --  24.5*  MCHC 27.2*  --  27.7*  --  29.6*  RDW 18.6*  --  18.3*  --  18.1*  PLT 320  --  125* 174 173   BNP Recent Labs  Lab 06/26/20 1435  BNP 315.4*    DDimer  Recent Labs  Lab 06/26/20 1429  DDIMER >20.00*     Radiology/Studies:  DG Chest 1 View  Result Date: 06/26/2020 CLINICAL DATA:  Hypoxia.  Central catheter placement EXAM: CHEST  1 VIEW COMPARISON:  Jun 26, 2020 study obtained earlier in the day FINDINGS: Right jugular catheter tip is at the cavoatrial junction.  Endotracheal tube tip is 2.8 cm above the carina. Nasogastric tube tip and side port are in the stomach. No appreciable pneumothorax. Ill-defined airspace opacity is noted in each lower lung region, similar to earlier in the day. There is persistent elevation of the right hemidiaphragm. Heart is prominent, stable, with pulmonary vascularity within normal limits. No adenopathy. No bone lesions. IMPRESSION: Tube and catheter positions as described without pneumothorax. Persistent elevation right hemidiaphragm. Airspace opacity in both lower lung regions is stable. No new opacity. Stable cardiac prominence. Electronically Signed   By: Lowella Grip III M.D.   On: 06/26/2020 14:33   CT Head Wo Contrast  Result Date: 06/25/2020 CLINICAL DATA:  Altered mental status EXAM: CT HEAD WITHOUT CONTRAST TECHNIQUE: Contiguous axial images were obtained from the base of the skull through the vertex without intravenous contrast. COMPARISON:  None. FINDINGS: Brain: Normal anatomic configuration. No abnormal intra or extra-axial mass lesion or fluid collection. No abnormal mass effect or midline shift. No evidence of acute intracranial hemorrhage or infarct. Ventricular size is normal. Cerebellum unremarkable. Vascular: Unremarkable Skull: Intact Sinuses/Orbits: Paranasal sinuses are clear. Orbits are unremarkable. Other: Mastoid air cells and middle ear cavities are clear. IMPRESSION: No acute intracranial abnormality. Electronically Signed   By: Fidela Salisbury MD   On: 06/25/2020 19:23   US RENAL  Result Date: 06/26/2020 CLINICAL DATA:  Acute renal injury EXAM: RENAL / URINARY TRACT ULTRASOUND COMPLETE COMPARISON:  None. FINDINGS: Examination is significantly limited due the patient's body habitus. Right Kidney: Renal measurements: 10.5 x 5.2 x 6.3 cm. = volume: 180  mL. Echogenicity within normal limits. No mass or hydronephrosis visualized. Left Kidney: Not well visualized Bladder: Decompressed by Foley catheter. Other:  None. IMPRESSION: Extremely limited exam shows no acute abnormality in the right kidney. The left kidney is not visualized. Electronically Signed   By: Inez Catalina M.D.   On: 06/26/2020 08:45   DG CHEST PORT 1 VIEW  Result Date: 06/26/2020 CLINICAL DATA:  Respiratory failure. EXAM: PORTABLE CHEST 1 VIEW COMPARISON:  06/25/2020. FINDINGS: Endotracheal tube tip again noted at the level the carina. Proximal repositioning of approximately 3 cm suggested. NG tube in stable position. Cardiomegaly. Bilateral pulmonary infiltrates/edema again noted. Interim slight improvement from prior exam. Stable elevation right hemidiaphragm. No pleural effusion or pneumothorax. IMPRESSION: 1. Endotracheal tube tip noted at the level the carina. Proximal repositioning of approximately 3 cm suggested. 2.  Cardiomegaly. 3. Bilateral pulmonary infiltrates/edema again noted. Slight improvement from prior exam. Stable elevation right hemidiaphragm. These results will be called to the ordering clinician or representative by the Radiologist Assistant, and communication documented in the PACS or [employer]. Electronically Signed   By: [place]   On: 06/26/2020 08:39   DG Chest Portable 1 View  Result Date: 06/25/2020 CLINICAL DATA:  Hypoxia EXAM: PORTABLE CHEST 1 VIEW COMPARISON:  None. FINDINGS: None endotracheal tube tip at or just above the carina. Esophageal tube tip below the diaphragm but incompletely visualized. Low lung volumes. Cardiomegaly. Streaky perihilar consolidations and patchy bilateral foci of airspace disease. Central bronchovascular crowding due to low lung volume. No pneumothorax or definitive effusion IMPRESSION: 1. Endotracheal tube tip at or just above the carina. 2. Low lung volumes. Perihilar consolidations and scattered foci of airspace disease, favor pneumonia. 3. Cardiomegaly Electronically Signed   By: Donavan Foil M.D.   On: 06/25/2020 16:37   ECHOCARDIOGRAM COMPLETE  Result Date:  06/26/2020    ECHOCARDIOGRAM REPORT   Patient Name:   IVIS NICOLSON Date of Exam: 06/26/2020 Medical Rec #:  [id number]     Height:       66.0 in Accession #:    [id number]    Weight:       440.0 lb Date of Birth:  06-03-1970      BSA:          2.796 m Patient Age:    75 years      BP:           126/68 mmHg Patient Gender: F             HR:           67 bpm. Exam Location:  Inpatient Procedure: 2D Echo, Cardiac Doppler and Color Doppler Indications:    Respiratory failure ([place]) [300762]  History:        Patient has no prior history of Echocardiogram examinations.  Sonographer:    Bernadene Person RDCS Referring Phys: [place]  1. Left ventricular ejection fraction, by estimation, is 55 to 60%. The left ventricle has normal function. The left ventricle has no regional wall motion abnormalities. Left ventricular diastolic parameters were normal. There is the interventricular septum is flattened in systole and diastole, consistent with right ventricular pressure and volume overload.  2. McConnell's Sign noted. Right ventricular systolic function is normal in the base. The right ventricular size is moderately enlarged.  3. The mitral valve is grossly normal. No evidence of mitral valve regurgitation.  4. The aortic valve is tricuspid. Aortic valve regurgitation is not visualized. No aortic stenosis is  present.  5. There is mild dilatation of the ascending aorta, measuring 41 mm. Comparison(s): No prior Echocardiogram. Discussed finding with critical care team. FINDINGS  Left Ventricle: Left ventricular ejection fraction, by estimation, is 55 to 60%. The left ventricle has normal function. The left ventricle has no regional wall motion abnormalities. The left ventricular internal cavity size was normal in size. There is  no left ventricular hypertrophy. The interventricular septum is flattened in systole and diastole, consistent with right ventricular pressure and volume overload. Left ventricular  diastolic parameters were normal. Right Ventricle: McConnell's Sign noted. The right ventricular size is moderately enlarged. No increase in right ventricular wall thickness. Right ventricular systolic function is normal. Left Atrium: Left atrial size was normal in size. Right Atrium: Right atrial size was normal in size. Pericardium: There is no evidence of pericardial effusion. Mitral Valve: The mitral valve is grossly normal. No evidence of mitral valve regurgitation. Tricuspid Valve: The tricuspid valve is grossly normal. Tricuspid valve regurgitation is not demonstrated. Aortic Valve: The aortic valve is tricuspid. Aortic valve regurgitation is not visualized. No aortic stenosis is present. Pulmonic Valve: The pulmonic valve was not well visualized. Pulmonic valve regurgitation is not visualized. No evidence of pulmonic stenosis. Aorta: The aortic root is normal in size and structure. There is mild dilatation of the ascending aorta, measuring 41 mm. IAS/Shunts: The atrial septum is grossly normal.  LEFT VENTRICLE PLAX 2D LVIDd:         3.80 cm  Diastology LVIDs:         2.50 cm  LV e' medial:    9.15 cm/s LV PW:         1.60 cm  LV E/e' medial:  13.2 LV IVS:        1.10 cm  LV e' lateral:   9.60 cm/s LVOT diam:     2.10 cm  LV E/e' lateral: 12.6 LV SV:         90 LV SV Index:   32 LVOT Area:     3.46 cm  RIGHT VENTRICLE RV S prime:     11.60 cm/s TAPSE (M-mode): 1.7 cm LEFT ATRIUM             Index       RIGHT ATRIUM           Index LA diam:        3.30 cm 1.18 cm/m  RA Area:     12.50 cm LA Vol (A2C):   51.4 ml 18.38 ml/m RA Volume:   27.70 ml  9.91 ml/m LA Vol (A4C):   49.6 ml 17.74 ml/m LA Biplane Vol: 55.1 ml 19.71 ml/m  AORTIC VALVE LVOT Vmax:   137.00 cm/s LVOT Vmean:  89.300 cm/s LVOT VTI:    0.259 m  AORTA Ao Root diam: 3.10 cm Ao Asc diam:  4.10 cm MITRAL VALVE                TRICUSPID VALVE MV Area (PHT): 2.03 cm     TR Peak grad:   18.3 mmHg MV Decel Time: 373 msec     TR Vmax:        214.00  cm/s MV E velocity: 121.00 cm/s MV A velocity: 83.20 cm/s   SHUNTS MV E/A ratio:  1.45         Systemic VTI:  0.26 m  Systemic Diam: 2.10 cm Rudean Haskell MD Electronically signed by Rudean Haskell MD Signature Date/Time: 06/26/2020/5:44:53 PM    Final    US Abdomen Limited RUQ (LIVER/GB)  Result Date: 06/26/2020 CLINICAL DATA:  Transaminitis, elevated liver function tests EXAM: ULTRASOUND ABDOMEN LIMITED RIGHT UPPER QUADRANT COMPARISON:  None. FINDINGS: Gallbladder: Extensive sludge and gallstones are seen within the gallbladder lumen. The gallbladder, however, is not distended, there is no gallbladder wall thickening identified, and no pericholecystic fluid is seen. The sonographic Percell Miller sign is reportedly negative. Common bile duct: Diameter: 6-7 mm proximally Liver: There is limited evaluation of the a hepatic dome secondary to overlying osseous and pulmonary structures. The visualized inferior right and left hepatic lobes demonstrate normal echogenicity and echotexture. No focal intrahepatic masses are seen. There is no intrahepatic biliary ductal dilation portal vein is patent on color Doppler imaging with normal direction of blood flow towards the liver. Other: No ascites IMPRESSION: Cholelithiasis without sonographic evidence of acute cholecystitis. Limited but unremarkable examination of the liver. Electronically Signed   By: Fidela Salisbury MD   On: 06/26/2020 18:31     Assessment and Plan:   1. RV volume overload  - Echocardiogram showed LV function of 55 to 60%, no regional wall motion abnormality, findings consistent with RV volume overload with McConnell's Sign. Normal RV function at base.   - Net I & O +1.2L since admit and +611cc yesterday (given IV lasix 163m x1 yesterday) -Exam limited due to body habitus, BNP minimally up at 315, could be inaccurate given morbid obesity -D-dimer significantly elevated>>Pending lower extremity venous  Doppler -Elevated LFTs but right upper quadrant ultrasound without evidence of acute cholecystitis. -? rule out PE by VQ scan - Suspected obesity hypoventilation syndrome - Will review diuresis plan with MD  2. Hypokalemia - Keep K > 4  3. AKI - Scr improved to 3.57 today from yesterday - Follow closely   4. SVT - Brief SVT episode - Keep K > 4 and MG > 2  For questions or updates, please contact [facility] Please consult [email or web address] for contact info under    SJarrett Soho PA  06/27/2020 8:09 AM

## 2020-06-27 NOTE — Progress Notes (Addendum)
eLink Physician-Brief Progress Note Patient Name: Felicia Warren DOB: [birth year] MRN: [id number]   Date of Service  06/27/2020  HPI/Events of Note  Troponin = 120 --> 205--> 368 - Suspect demand ischemia in setting of AKI. ECHO with LVEF 55-60%, no WMA.  Patient is already on a Heparin IV infusion for presumption of PE.   eICU Interventions  Plan: 1. 12 Lead EKG STAT.     Intervention Category Major Interventions: Other:  Anna Beaird Dennard Nip 06/27/2020, 8:00 PM

## 2020-06-27 NOTE — Progress Notes (Signed)
[place] for IV heparin Indication: PE r/o  Allergies  Allergen Reactions  . 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, unlikely to be positive    Patient Measurements: Height: '5\' 6"'  (167.6 cm) Weight: (!) 201.1 kg (443 lb 5.5 oz) IBW/kg (Calculated) : 59.3 Heparin Dosing Weight: 117 kg  Vital Signs: Temp: 98 F (36.7 C) (05/06 1200) Temp Source: Oral (05/06 1200) BP: 128/71 (05/06 1400) Pulse Rate: 76 (05/06 1400)  Labs: Recent Labs    06/25/20 1515 06/25/20 1524 06/25/20 1715 06/25/20 2043 06/25/20 2156 06/26/20 0320 06/26/20 1429 06/27/20 0444  HGB  --    < > 12.0 15.3*  --  11.5*  --  11.5*  HCT  --    < > 44.1 45.0  --  41.5  --  38.8  PLT  --    < > 320  --   --  125* 174 173  APTT  --   --   --   --   --   --  30  --   LABPROT  --   --  23.5*  --   --   --  21.0*  21.3*  --   INR  --   --  2.1*  --   --   --  1.8*  1.9*  --   CREATININE 3.83*   < >  --   --  3.62* 3.83*  --  3.57*  CKTOTAL  --   --   --   --  124  --   --   --   TROPONINIHS 120*  --  205*  --   --   --   --   --    < > = values in this interval not displayed.    Estimated Creatinine Clearance: 34.1 mL/min (A) (by C-G formula based on SCr of 3.57 mg/dL (H)).   Medical History: No past medical history on file.  Medications:  No medications prior to admission.   Scheduled:  . chlorhexidine gluconate (MEDLINE KIT)  15 mL Mouth Rinse BID  . Chlorhexidine Gluconate Cloth  6 each Topical Daily  . feeding supplement (PROSource TF)  90 mL Per Tube 5 X Daily  . feeding supplement (VITAL HIGH PROTEIN)  1,000 mL Per Tube Q24H  . heparin  4,000 Units Intravenous Once  . mouth rinse  15 mL Mouth Rinse 10 times per day  . multivitamin  15 mL Per Tube Daily  . pantoprazole (PROTONIX) IV  40 mg Intravenous QHS  . sodium chloride flush  3 mL Intravenous Q12H    Assessment: 34 yoM with unknown PMH admitted 5/4 with AMS d/t hypoglycemia/PNA; now with elevated D-dimer.     Baseline INR mildly elevated (possibly d/t hepatic congestion + shock liver) but decreased to < 2.0; aPTT WNL  Prior anticoagulation: unknown, however all anticoag markers are now subtherapeutic  Significant events:  Today, 06/27/2020:  CBC: Hgb mildly decreased; Plt WNL  AKI noted  No bleeding or infusion issues per nursing  Goal of Therapy: Heparin level 0.3-0.7 units/ml Monitor platelets by anticoagulation protocol: Yes  Plan:  Heparin 4000 units IV bolus x 1  Heparin 2100 units/hr IV infusion  Check heparin level 8 hrs after start  Daily CBC, daily heparin level once stable  Monitor for signs of bleeding or thrombosis  Reuel Boom, PharmD, BCPS [phone] 06/27/2020, 3:40 PM

## 2020-06-27 NOTE — Progress Notes (Signed)
NAME:  Arlene Brickel, MRN:  [id number], DOB:  [birth year], LOS: 2 ADMISSION DATE:  06/25/2020, CONSULTATION DATE:  06/27/20 REFERRING MD:  EDP, CHIEF COMPLAINT:  AMS   History of Present Illness:  50 y/o F who presented initially as a [name], found with altered mental status in a motel by staff.  It is unknown when her last known well was.  EMS found patient awake, but altered glucose was initially 40.  This was corrected, but she continued to have worsening mental status.  Narcan did not provide any response.  In ED she required nonrebreather mask and eventual intubation for hypoxic respiratory failure.  Chest x-ray with probable multifocal pneumonia.  AKI, elevated INR on admit.  No prior history in epic and no known emergency contact.  Pertinent  Medical History  Obesity ?  OSA  Significant Hospital Events: Including procedures, antibiotic start and stop dates in addition to other pertinent events    5/4 presented to ED with hypoglycemia and AMS, required intubation, PCCM consult. CXR concerning for PNA.  Unasyn initiated. Tx to [facility] for beds. UDS neg, neg tylenol/ salicylates. CTH neg. Lactic acid 5.9-> 4.9 . 5/5 Hypotensive, responded to IVF + albumin, on heparin gtt but stopped due to platelet decline, not on pressors, requiring PEEP 10 / FiO2 80. RUQ [place] with cholelithiasis but no cholecystitis.   Marland Kitchen 5/6 Vent needs decreased. LE venous duplex negative.   Interim History / Subjective:  Afebrile / WBC 9.8  Micro - tracheal aspirate pending, BC with 1/2 with GPR  Vent - PEEP 8, FiO2 40% I/O UOP, +662ml in last 24 hours   Objective   Blood pressure 130/70, pulse 72, temperature 97.6 F (36.4 C), temperature source Oral, resp. rate (!) 24, height 5\' 6"  (1.676 m), weight (!) 201.1 kg, SpO2 100 %.    Vent Mode: PRVC FiO2 (%):  [30 %-60 %] 30 % Set Rate:  [24 bmp] 24 bmp Vt Set:  [470 mL] 470 mL PEEP:  [8 cmH20-10 cmH20] 8 cmH20 Plateau Pressure:  [23 cmH20-28 cmH20] 28 cmH20    Intake/Output Summary (Last 24 hours) at 06/27/2020 1017 Last data filed at 06/27/2020 08/27/2020 Gross per 24 hour  Intake 979.07 ml  Output 725 ml  Net 254.07 ml   Filed Weights   06/25/20 1535 06/26/20 0149 06/27/20 0500  Weight: (!) 190.5 kg (!) 199.6 kg (!) 201.1 kg   Exam: General: critically ill appearing morbidly obese female lying in bed in NAD on vent  HEENT: MM pink/moist, ETT, OGT, pupils 41mm Neuro: sedate on propofol, followed commands per RN on WUA  CV: s1s2 RRR, SR on monitor, no m/r/g PULM: non-labored on vent, lungs bilaterally with soft wheeze GI: soft, bsx4 active  Extremities: warm/dry, 1+ edema  Skin: LUE marked where prior IV infiltrated, no erythema or warmth  Labs/imaging that I havepersonally reviewed  (right click and "Reselect all SmartList Selections" daily)  CBC - anemia, no schistocytes on peripheral smear  CMP - hypokalemia, AKI, AST 1,003 / ALT 757  CXR - images reviewed from 5/5, none new 5/6   Resolved Hospital Problem list   Hypotension / Shock   Assessment & Plan:   Acute Metabolic Encephalopathy secondary to Hypoglycemia & Hypercapnic Respiratory Failure Unknown past medical history, but would suspect some element of obesity hypoventilation syndrome or OSA given hypercarbia and elevated bicarbonate. CTH, UDS, ammonia negative.  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  -PRVC 8cc/kg as rest mode -wean PEEP / FiO2 for sats >90% > significantly improved O2 needs  -daily SBT / WUA -VAP prevention measures -PAD protocol as above  -continue unasyn for suspected aspiration  -elevated D-dimer but PE moving down differential given reduction of O2 needs, negative LE doppler. ? If findings on ECHO are related to sequelae of  longstanding OHS/PH   AKI, undetermined acuity AGMA/ lactic acidosis  No baseline creatinine for comparison, minimal urine output in the ED s/p 2L IV bolus. Renal US with poor windows / unable to visualize left kidney, right kidney w/o abnormality.  -Trend BMP / urinary output -Replace electrolytes as indicated -Avoid nephrotoxic agents, ensure adequate renal perfusion   Elevated Troponin  McConnell's Sign  Suspect in setting of demand, trop hs 120-205. ECHO with LVEF 55-60%, no WMA, McConnell's sign with moderately enlarged RV, interventricular septal flattening concerning for RV overload  -heparin gtt stopped with drop in platelets  (doubt HIT  -given hypercarbia and elevated bicarbonate on ABG, negative LE dopplers would favor RV overload / changes c/w OHS  -unable to do CTA to r/o PE given AKI  -normal LVEF, doubt cardiogenic source of initial shock.  Assess repeat SvO2   Elevated liver enzymes DDx includes downtime from hypoglycemia / shock, CHF. RUQ [place] negative. Acute hepatitis panel negative.  -follow LFT trend, improving  -supportive care  -follow glucose closely   Hypoglycemia  Glucose in 40's on admit, no available hx -follow glucose trend  -CBG q4  Thrombocytopenia  Unclear etiology.  320-> 125, heparin sq stopped and HIT panel sent.  Would normally not fit time frame for HIT unless prior allergy to heparin and we have no prior information on her.  DDx includes sepsis vs underlying liver dysfunction, stress response  -follow for bleeding  -doubt HIT but follow up panel results  -continue SCD's  Best practice (right click and "Reselect all SmartList Selections" daily)  Diet:  NPO, start TF Pain/Anxiety/Delirium protocol (if indicated): Yes (RASS goal -1) VAP protocol (if indicated): Yes DVT prophylaxis: SCD GI prophylaxis: PPI Glucose control:  SSI Yes Central venous access:  N/A Arterial line:  N/A Foley:  Yes, and it is still needed Mobility:  bed rest  PT  consulted: N/A Last date of multidisciplinary goals of care discussion [pending] Code Status:  full code Disposition: ICU  TOC consulted as we have no contacts for her.    Critical Care Time: 36 minutes     Canary Brim, MSN, APRN, NP-C, AGACNP-BC [facility] 06/27/2020, 10:18 AM   Please see [email or web address] for pager details.   From 7A-7P if no response, please call [phone] After hours, please call ELink [phone]

## 2020-06-28 ENCOUNTER — Inpatient Hospital Stay (HOSPITAL_COMMUNITY): Payer: Self-pay

## 2020-06-28 DIAGNOSIS — R778 Other specified abnormalities of plasma proteins: Secondary | ICD-10-CM

## 2020-06-28 DIAGNOSIS — R7401 Elevation of levels of liver transaminase levels: Secondary | ICD-10-CM

## 2020-06-28 DIAGNOSIS — I50810 Right heart failure, unspecified: Secondary | ICD-10-CM

## 2020-06-28 LAB — COMPREHENSIVE METABOLIC PANEL
ALT: 574 U/L — ABNORMAL HIGH (ref 0–44)
AST: 382 U/L — ABNORMAL HIGH (ref 15–41)
Albumin: 2.9 g/dL — ABNORMAL LOW (ref 3.5–5.0)
Alkaline Phosphatase: 71 U/L (ref 38–126)
Anion gap: 15 (ref 5–15)
BUN: 56 mg/dL — ABNORMAL HIGH (ref 6–20)
CO2: 28 mmol/L (ref 22–32)
Calcium: 7.5 mg/dL — ABNORMAL LOW (ref 8.9–10.3)
Chloride: 100 mmol/L (ref 98–111)
Creatinine, Ser: 2.16 mg/dL — ABNORMAL HIGH (ref 0.44–1.00)
GFR, Estimated: 27 mL/min — ABNORMAL LOW (ref >60)
Glucose, Bld: 120 mg/dL — ABNORMAL HIGH (ref 70–99)
Potassium: 2.9 mmol/L — ABNORMAL LOW (ref 3.5–5.1)
Sodium: 143 mmol/L (ref 135–145)
Total Bilirubin: 1.2 mg/dL (ref 0.3–1.2)
Total Protein: 6.4 g/dL — ABNORMAL LOW (ref 6.5–8.1)

## 2020-06-28 LAB — HEPARIN LEVEL (UNFRACTIONATED)
Heparin Unfractionated: 0.5 IU/mL (ref 0.30–0.70)
Heparin Unfractionated: 0.71 IU/mL — ABNORMAL HIGH (ref 0.30–0.70)
Heparin Unfractionated: 0.45 IU/mL (ref 0.30–0.70)

## 2020-06-28 LAB — CBC
HCT: 38.1 % (ref 36.0–46.0)
Hemoglobin: 11.1 g/dL — ABNORMAL LOW (ref 12.0–15.0)
MCH: 24.3 pg — ABNORMAL LOW (ref 26.0–34.0)
MCHC: 29.1 g/dL — ABNORMAL LOW (ref 30.0–36.0)
MCV: 83.4 fL (ref 80.0–100.0)
Platelets: 170 10*3/uL (ref 150–400)
RBC: 4.57 MIL/uL (ref 3.87–5.11)
RDW: 18.1 % — ABNORMAL HIGH (ref 11.5–15.5)
WBC: 10.3 10*3/uL (ref 4.0–10.5)
nRBC: 0.6 % — ABNORMAL HIGH (ref 0.0–0.2)

## 2020-06-28 LAB — GLUCOSE, CAPILLARY
Glucose-Capillary: 100 mg/dL — ABNORMAL HIGH (ref 70–99)
Glucose-Capillary: 104 mg/dL — ABNORMAL HIGH (ref 70–99)
Glucose-Capillary: 92 mg/dL (ref 70–99)
Glucose-Capillary: 96 mg/dL (ref 70–99)
Glucose-Capillary: 98 mg/dL (ref 70–99)
Glucose-Capillary: 98 mg/dL (ref 70–99)

## 2020-06-28 LAB — PROTIME-INR
INR: 1.4 — ABNORMAL HIGH (ref 0.8–1.2)
Prothrombin Time: 16.9 seconds — ABNORMAL HIGH (ref 11.4–15.2)

## 2020-06-28 LAB — MAGNESIUM: Magnesium: 1.8 mg/dL (ref 1.7–2.4)

## 2020-06-28 LAB — TROPONIN I (HIGH SENSITIVITY): Troponin I (High Sensitivity): 284 ng/L (ref <18)

## 2020-06-28 LAB — CULTURE, RESPIRATORY W GRAM STAIN: Culture: NORMAL

## 2020-06-28 LAB — PROCALCITONIN: Procalcitonin: 0.32 ng/mL

## 2020-06-28 LAB — LACTIC ACID, PLASMA: Lactic Acid, Venous: 1.3 mmol/L (ref 0.5–1.9)

## 2020-06-28 MED ORDER — POTASSIUM CHLORIDE 10 MEQ/50ML IV SOLN
10.0000 meq | INTRAVENOUS | Status: AC
  Administered 2020-06-28 (×4): 10 meq via INTRAVENOUS
  Filled 2020-06-28 (×4): qty 50

## 2020-06-28 NOTE — Progress Notes (Signed)
[place] for IV heparin Indication: presumed PE  No Active Allergies  Patient Measurements: Height: '5\' 6"'  (167.6 cm) Weight: (!) 244 kg (538 lb) (weighed on Sizewise bed - new on 06/27/20 day shift) IBW/kg (Calculated) : 59.3 Heparin Dosing Weight: 117 kg  Vital Signs: Temp: 97.7 F (36.5 C) (05/07 1750) Temp Source: Axillary (05/07 1750) BP: 115/78 (05/07 2030) Pulse Rate: 72 (05/07 1900)  Labs: Recent Labs    06/25/20 2043 06/25/20 2156 06/26/20 0320 06/26/20 1429 06/27/20 0444 06/27/20 1606 06/28/20 0021 06/28/20 0406 06/28/20 0407 06/28/20 0813 06/28/20 1828  HGB  --   --  11.5*  --  11.5*  --   --   --  11.1*  --   --   HCT  --   --  41.5  --  38.8  --   --   --  38.1  --   --   PLT   < >  --  125* 174 173  --   --   --  170  --   --   APTT  --   --   --  30  --   --   --   --   --   --   --   LABPROT  --   --   --  21.0*  21.3*  --   --   --   --  16.9*  --   --   INR  --   --   --  1.8*  1.9*  --   --   --   --  1.4*  --   --   HEPARINUNFRC  --   --   --   --   --   --  0.50  --   --  0.71* 0.45  CREATININE  --  3.62* 3.83*  --  3.57*  --   --   --  2.16*  --   --   CKTOTAL  --  124  --   --   --  153  --   --   --   --   --   CKMB  --   --   --   --   --  4.9  --   --   --   --   --   TROPONINIHS  --   --   --   --   --  368*  --  284*  --   --   --    < > = values in this interval not displayed.    Estimated Creatinine Clearance: 64.8 mL/min (A) (by C-G formula based on SCr of 2.16 mg/dL (H)).   Medical History: No past medical history on file.  Medications:  No medications prior to admission.   Scheduled:  . chlorhexidine gluconate (MEDLINE KIT)  15 mL Mouth Rinse BID  . Chlorhexidine Gluconate Cloth  6 each Topical Daily  . feeding supplement (PROSource TF)  90 mL Per Tube 5 X Daily  . feeding supplement (VITAL HIGH PROTEIN)  1,000 mL Per Tube Q24H  . mouth rinse  15 mL Mouth Rinse 10 times per day  .  multivitamin  15 mL Per Tube Daily  . pantoprazole (PROTONIX) IV  40 mg Intravenous QHS  . sodium chloride flush  3 mL Intravenous Q12H    Assessment: 48 yoM with unknown PMH admitted 5/4 with AMS d/t hypoglycemia/PNA; now with elevated D-dimer.  Baseline INR mildly elevated (possibly d/t hepatic congestion + shock liver) but decreased to < 2.0; aPTT WNL  Prior anticoagulation: unknown, however all anticoag markers are now subtherapeutic  06/28/2020  Heparin level therapeutic after decreasing rate to 1700 units/hr  CBC - Hgb 11.1 stable and Plts 170 stable  Per RN, no bleeding or infusion issues noted  Of note, unable to do CT or VQ to officially "rule out" PE  Goal of Therapy: Heparin level 0.3-0.7 units/ml Monitor platelets by anticoagulation protocol: Yes  Plan:  Continue heparin at 1700 units/hr  Confirmatory heparin level with AM labs  Daily CBC and heparin level  Monitor for signs of bleeding or thrombosis   Reuel Boom, PharmD, BCPS [phone] 06/28/2020, 8:44 PM

## 2020-06-28 NOTE — Progress Notes (Signed)
[place] [place] for IV heparin Indication: presumed PE  Allergies  Allergen Reactions  . 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, unlikely to be positive    Patient Measurements: Height: _0  (167.6 cm) Weight: (!) 244 kg (538 lb) (weighed on Sizewise bed - new on 06/27/20 day shift) IBW/kg (Calculated) : 59.3 Heparin Dosing Weight: 117 kg  Vital Signs: Temp: 98.9 F (37.2 C) (05/07 0400) Temp Source: Oral (05/07 0400) BP: 96/52 (05/07 0759) Pulse Rate: 75 (05/07 0759)  Labs: Recent Labs    06/25/20 1715 06/25/20 2043 06/25/20 2156 06/26/20 0320 06/26/20 1429 06/27/20 0444 06/27/20 1606 06/28/20 0021 06/28/20 0406 06/28/20 0407 06/28/20 0813  HGB 12.0   < >  --  11.5*  --  11.5*  --   --   --  11.1*  --   HCT 44.1   < >  --  41.5  --  38.8  --   --   --  38.1  --   PLT 320  --   --  125* 174 173  --   --   --  170  --   APTT  --   --   --   --  30  --   --   --   --   --   --   LABPROT 23.5*  --   --   --  21.0*  21.3*  --   --   --   --  16.9*  --   INR 2.1*  --   --   --  1.8*  1.9*  --   --   --   --  1.4*  --   HEPARINUNFRC  --   --   --   --   --   --   --  0.50  --   --  0.71*  CREATININE  --   --  3.62* 3.83*  --  3.57*  --   --   --  2.16*  --   CKTOTAL  --   --  124  --   --   --  153  --   --   --   --   CKMB  --   --   --   --   --   --  4.9  --   --   --   --   TROPONINIHS 205*  --   --   --   --   --  368*  --  284*  --   --    < > = values in this interval not displayed.    Estimated Creatinine Clearance: 64.8 mL/min (A) (by C-G formula based on SCr of 2.16 mg/dL (H)).   Medical History: No past medical history on file.  Medications:  No medications prior to admission.   Scheduled:  . chlorhexidine gluconate (MEDLINE KIT)  15 mL Mouth Rinse BID  . Chlorhexidine Gluconate Cloth  6 each Topical Daily  . feeding supplement (PROSource TF)  90 mL Per Tube 5 X Daily  . feeding supplement (VITAL HIGH PROTEIN)  1,000 mL Per  Tube Q24H  . mouth rinse  15 mL Mouth Rinse 10 times per day  . multivitamin  15 mL Per Tube Daily  . pantoprazole (PROTONIX) IV  40 mg Intravenous QHS  . sodium chloride flush  3 mL Intravenous Q12H    Assessment: [name] with unknown PMH admitted 5/4  with AMS d/t hypoglycemia/PNA; now with elevated D-dimer.   Baseline INR mildly elevated (possibly d/t hepatic congestion + shock liver) but decreased to < 2.0; aPTT WNL  Prior anticoagulation: unknown, however all anticoag markers are now subtherapeutic  06/28/2020  Heparin level now SURAtherapeutic on current IV heparin rate of 2100 units/hr  CBC - Hgb 11.1 stable and Plts 170 stable  Per RN, no bleeding or infusion issues noted  Of note, unable to do CT or VQ to officially "rule out" PE  Goal of Therapy: Heparin level 0.3-0.7 units/ml Monitor platelets by anticoagulation protocol: Yes  Plan:  Reduce IV heparin from rate 2100 units/hr to 1700 units/hr  Recheck heparin level 8 hrs after rate change   Daily CBC, daily heparin level once stable  Monitor for signs of bleeding or thrombosis   Adrian Saran, PharmD, BCPS Secure Chat if ?s 06/28/2020 9:37 AM

## 2020-06-28 NOTE — Progress Notes (Signed)
Progress Note  Patient Name: Felicia Warren Date of Encounter: 06/28/2020  Primary Cardiologist:   None   Subjective   Intubated and sedated.    Inpatient Medications    Scheduled Meds: . chlorhexidine gluconate (MEDLINE KIT)  15 mL Mouth Rinse BID  . Chlorhexidine Gluconate Cloth  6 each Topical Daily  . feeding supplement (PROSource TF)  90 mL Per Tube 5 X Daily  . feeding supplement (VITAL HIGH PROTEIN)  1,000 mL Per Tube Q24H  . mouth rinse  15 mL Mouth Rinse 10 times per day  . multivitamin  15 mL Per Tube Daily  . pantoprazole (PROTONIX) IV  40 mg Intravenous QHS  . sodium chloride flush  3 mL Intravenous Q12H   Continuous Infusions: . sodium chloride 10 mL/hr at 06/28/20 0400  . cefTRIAXone (ROCEPHIN)  IV Stopped (06/27/20 2056)  . heparin 2,100 Units/hr (06/28/20 0400)  . lactated ringers 75 mL/hr at 06/28/20 0400  . propofol (DIPRIVAN) infusion 30 mcg/kg/min (06/28/20 0544)   PRN Meds: sodium chloride, docusate sodium, fentaNYL (SUBLIMAZE) injection, polyethylene glycol, sodium chloride flush   Vital Signs    Vitals:   06/28/20 0430 06/28/20 0500 06/28/20 0600 06/28/20 0630  BP: 117/61 122/68  (!) 112/53  Pulse:      Resp: (!) 24 (!) 24  10  Temp:      TempSrc:      SpO2:   99%   Weight:  (!) 244 kg    Height:        Intake/Output Summary (Last 24 hours) at 06/28/2020 0733 Last data filed at 06/28/2020 0400 Gross per 24 hour  Intake 4425.26 ml  Output 1325 ml  Net 3100.26 ml   Filed Weights   06/26/20 0149 06/27/20 0500 06/28/20 0500  Weight: (!) 199.6 kg (!) 201.1 kg (!) 244 kg    Telemetry    NSR,SB - Personally Reviewed  ECG    NA - Personally Reviewed  Physical Exam   GEN: No acute distress.   Neck: No  JVD Cardiac: RRR, no murmurs, rubs, or gallops.  Respiratory: Clear  to auscultation bilaterally.  Difficult exam with morbid obesity GI: Soft, nontender, non-distended  MS: No  edema; No deformity. Neuro:     Sedated Psych:     Sedated  Labs    Chemistry Recent Labs  Lab 06/26/20 0320 06/26/20 0959 06/27/20 0444 06/28/20 0407  NA 143  --  143 143  K 4.4  --  3.1* 2.9*  CL 99  --  99 100  CO2 26  --  26 28  GLUCOSE 103*  --  125* 120*  BUN 46*  --  61* 56*  CREATININE 3.83*  --  3.57* 2.16*  CALCIUM 7.4*  --  7.7* 7.5*  PROT  --  7.0 6.6 6.4*  ALBUMIN  --  3.3* 3.4* 2.9*  AST  --  2,160* 1,003* 382*  ALT  --  912* 757* 574*  ALKPHOS  --  88 86 71  BILITOT  --  2.6* 1.7* 1.2  GFRNONAA 14*  --  15* 27*  ANIONGAP 18*  --  18* 15     Hematology Recent Labs  Lab 06/26/20 0320 06/26/20 1429 06/27/20 0444 06/28/20 0407  WBC 14.3*  --  9.8 10.3  RBC 4.81  --  4.70 4.57  HGB 11.5*  --  11.5* 11.1*  HCT 41.5  --  38.8 38.1  MCV 86.3  --  82.6 83.4  MCH 23.9*  --  24.5* 24.3*  MCHC 27.7*  --  29.6* 29.1*  RDW 18.3*  --  18.1* 18.1*  PLT 125* 174 173 170    Cardiac EnzymesNo results for input(s): TROPONINI in the last 168 hours. No results for input(s): TROPIPOC in the last 168 hours.   BNP Recent Labs  Lab 06/26/20 1435  BNP 315.4*     DDimer  Recent Labs  Lab 06/26/20 1429 06/27/20 1200  DDIMER >20.00* >20.00*     Radiology    DG Chest 1 View  Result Date: 06/26/2020 CLINICAL DATA:  Hypoxia.  Central catheter placement EXAM: CHEST  1 VIEW COMPARISON:  Jun 26, 2020 study obtained earlier in the day FINDINGS: Right jugular catheter tip is at the cavoatrial junction. Endotracheal tube tip is 2.8 cm above the carina. Nasogastric tube tip and side port are in the stomach. No appreciable pneumothorax. Ill-defined airspace opacity is noted in each lower lung region, similar to earlier in the day. There is persistent elevation of the right hemidiaphragm. Heart is prominent, stable, with pulmonary vascularity within normal limits. No adenopathy. No bone lesions. IMPRESSION: Tube and catheter positions as described without pneumothorax. Persistent elevation right hemidiaphragm. Airspace opacity  in both lower lung regions is stable. No new opacity. Stable cardiac prominence. Electronically Signed   By: Lowella Grip III M.D.   On: 06/26/2020 14:33   US RENAL  Result Date: 06/26/2020 CLINICAL DATA:  Acute renal injury EXAM: RENAL / URINARY TRACT ULTRASOUND COMPLETE COMPARISON:  None. FINDINGS: Examination is significantly limited due the patient's body habitus. Right Kidney: Renal measurements: 10.5 x 5.2 x 6.3 cm. = volume: 180 mL. Echogenicity within normal limits. No mass or hydronephrosis visualized. Left Kidney: Not well visualized Bladder: Decompressed by Foley catheter. Other: None. IMPRESSION: Extremely limited exam shows no acute abnormality in the right kidney. The left kidney is not visualized. Electronically Signed   By: Inez Catalina M.D.   On: 06/26/2020 08:45   DG CHEST PORT 1 VIEW  Result Date: 06/26/2020 CLINICAL DATA:  Respiratory failure. EXAM: PORTABLE CHEST 1 VIEW COMPARISON:  06/25/2020. FINDINGS: Endotracheal tube tip again noted at the level the carina. Proximal repositioning of approximately 3 cm suggested. NG tube in stable position. Cardiomegaly. Bilateral pulmonary infiltrates/edema again noted. Interim slight improvement from prior exam. Stable elevation right hemidiaphragm. No pleural effusion or pneumothorax. IMPRESSION: 1. Endotracheal tube tip noted at the level the carina. Proximal repositioning of approximately 3 cm suggested. 2.  Cardiomegaly. 3. Bilateral pulmonary infiltrates/edema again noted. Slight improvement from prior exam. Stable elevation right hemidiaphragm. These results will be called to the ordering clinician or representative by the Radiologist Assistant, and communication documented in the PACS or [employer]. Electronically Signed   By: Marcello Moores  Register   On: 06/26/2020 08:39   ECHOCARDIOGRAM COMPLETE  Result Date: 06/26/2020    ECHOCARDIOGRAM REPORT   Patient Name:   GEARLINE SPILMAN Date of Exam: 06/26/2020 Medical Rec #:  [id number]      Height:       66.0 in Accession #:    [id number]    Weight:       440.0 lb Date of Birth:  07/27/1970      BSA:          2.796 m Patient Age:    50 years      BP:           126/68 mmHg Patient Gender: F             HR:  67 bpm. Exam Location:  Inpatient Procedure: 2D Echo, Cardiac Doppler and Color Doppler Indications:    Respiratory failure ([place]) [[id number]]  History:        Patient has no prior history of Echocardiogram examinations.  Sonographer:    Bernadene Person RDCS Referring Phys: [place]  1. Left ventricular ejection fraction, by estimation, is 55 to 60%. The left ventricle has normal function. The left ventricle has no regional wall motion abnormalities. Left ventricular diastolic parameters were normal. There is the interventricular septum is flattened in systole and diastole, consistent with right ventricular pressure and volume overload.  2. McConnell's Sign noted. Right ventricular systolic function is normal in the base. The right ventricular size is moderately enlarged.  3. The mitral valve is grossly normal. No evidence of mitral valve regurgitation.  4. The aortic valve is tricuspid. Aortic valve regurgitation is not visualized. No aortic stenosis is present.  5. There is mild dilatation of the ascending aorta, measuring 41 mm. Comparison(s): No prior Echocardiogram. Discussed finding with critical care team. FINDINGS  Left Ventricle: Left ventricular ejection fraction, by estimation, is 55 to 60%. The left ventricle has normal function. The left ventricle has no regional wall motion abnormalities. The left ventricular internal cavity size was normal in size. There is  no left ventricular hypertrophy. The interventricular septum is flattened in systole and diastole, consistent with right ventricular pressure and volume overload. Left ventricular diastolic parameters were normal. Right Ventricle: McConnell's Sign noted. The right ventricular size is moderately enlarged.  No increase in right ventricular wall thickness. Right ventricular systolic function is normal. Left Atrium: Left atrial size was normal in size. Right Atrium: Right atrial size was normal in size. Pericardium: There is no evidence of pericardial effusion. Mitral Valve: The mitral valve is grossly normal. No evidence of mitral valve regurgitation. Tricuspid Valve: The tricuspid valve is grossly normal. Tricuspid valve regurgitation is not demonstrated. Aortic Valve: The aortic valve is tricuspid. Aortic valve regurgitation is not visualized. No aortic stenosis is present. Pulmonic Valve: The pulmonic valve was not well visualized. Pulmonic valve regurgitation is not visualized. No evidence of pulmonic stenosis. Aorta: The aortic root is normal in size and structure. There is mild dilatation of the ascending aorta, measuring 41 mm. IAS/Shunts: The atrial septum is grossly normal.  LEFT VENTRICLE PLAX 2D LVIDd:         3.80 cm  Diastology LVIDs:         2.50 cm  LV e' medial:    9.15 cm/s LV PW:         1.60 cm  LV E/e' medial:  13.2 LV IVS:        1.10 cm  LV e' lateral:   9.60 cm/s LVOT diam:     2.10 cm  LV E/e' lateral: 12.6 LV SV:         90 LV SV Index:   32 LVOT Area:     3.46 cm  RIGHT VENTRICLE RV S prime:     11.60 cm/s TAPSE (M-mode): 1.7 cm LEFT ATRIUM             Index       RIGHT ATRIUM           Index LA diam:        3.30 cm 1.18 cm/m  RA Area:     12.50 cm LA Vol (A2C):   51.4 ml 18.38 ml/m RA Volume:   27.70 ml  9.91 ml/m  LA Vol (A4C):   49.6 ml 17.74 ml/m LA Biplane Vol: 55.1 ml 19.71 ml/m  AORTIC VALVE LVOT Vmax:   137.00 cm/s LVOT Vmean:  89.300 cm/s LVOT VTI:    0.259 m  AORTA Ao Root diam: 3.10 cm Ao Asc diam:  4.10 cm MITRAL VALVE                TRICUSPID VALVE MV Area (PHT): 2.03 cm     TR Peak grad:   18.3 mmHg MV Decel Time: 373 msec     TR Vmax:        214.00 cm/s MV E velocity: 121.00 cm/s MV A velocity: 83.20 cm/s   SHUNTS MV E/A ratio:  1.45         Systemic VTI:  0.26 m                              Systemic Diam: 2.10 cm Rudean Haskell MD Electronically signed by Rudean Haskell MD Signature Date/Time: 06/26/2020/5:44:53 PM    Final    VAS [place] LOWER EXTREMITY VENOUS (DVT)  Result Date: 06/27/2020  Lower Venous DVT Study Patient Name:  GEETIKA LABORDE  Date of Exam:   06/27/2020 Medical Rec #: [id number]      Accession #:    [id number] Date of Birth: [birth year]       Patient Gender: F Patient Age:   68Y Exam Location:  [facility] Procedure:      VAS [place] LOWER EXTREMITY VENOUS (DVT) Referring Phys: [id number] PAULA B SIMPSON --------------------------------------------------------------------------------  Indications: Edema.  Limitations: Body habitus and poor ultrasound/tissue interface. Comparison Study: No previous exams Performing Technologist: Rogelia Rohrer  Examination Guidelines: A complete evaluation includes B-mode imaging, spectral Doppler, color Doppler, and power Doppler as needed of all accessible portions of each vessel. Bilateral testing is considered an integral part of a complete examination. Limited examinations for reoccurring indications may be performed as noted. The reflux portion of the exam is performed with the patient in reverse Trendelenburg.  +---------+---------------+---------+-----------+----------+-------------------+ RIGHT    CompressibilityPhasicitySpontaneityPropertiesThrombus Aging      +---------+---------------+---------+-----------+----------+-------------------+ CFV      Full           Yes      Yes                                      +---------+---------------+---------+-----------+----------+-------------------+ SFJ      Full                                                             +---------+---------------+---------+-----------+----------+-------------------+ FV Prox  Full                                                             +---------+---------------+---------+-----------+----------+-------------------+ FV  Mid   Full                                                             +---------+---------------+---------+-----------+----------+-------------------+  FV Distal                                             Not visualized      +---------+---------------+---------+-----------+----------+-------------------+ PFV      Full                                                             +---------+---------------+---------+-----------+----------+-------------------+ POP      Full           Yes      Yes                                      +---------+---------------+---------+-----------+----------+-------------------+ PTV      Full                                         Not well visualized +---------+---------------+---------+-----------+----------+-------------------+ PERO     Full                                         Not well visualized +---------+---------------+---------+-----------+----------+-------------------+   Right Technical Findings: Not visualized segments include FV distal.  +---------+---------------+---------+-----------+----------+--------------+ LEFT     CompressibilityPhasicitySpontaneityPropertiesThrombus Aging +---------+---------------+---------+-----------+----------+--------------+ CFV      Full           Yes      Yes                                 +---------+---------------+---------+-----------+----------+--------------+ SFJ      Full                                                        +---------+---------------+---------+-----------+----------+--------------+ FV Prox  Full                                                        +---------+---------------+---------+-----------+----------+--------------+ FV Mid   Full                                                        +---------+---------------+---------+-----------+----------+--------------+ FV DistalFull                                                         +---------+---------------+---------+-----------+----------+--------------+  PFV      Full                                                        +---------+---------------+---------+-----------+----------+--------------+ POP                                                   Not visualized +---------+---------------+---------+-----------+----------+--------------+ PTV                                                   Not visualized +---------+---------------+---------+-----------+----------+--------------+ PERO                                                  Not visualized +---------+---------------+---------+-----------+----------+--------------+   Left Technical Findings: Not visualized segments include PopV, PeroV, PTV.   Summary: BILATERAL: -No evidence of popliteal cyst, bilaterally. RIGHT: - There is no evidence of deep vein thrombosis in the lower extremity. However, portions of this examination were limited- see technologist comments above.  LEFT: - There is no evidence of deep vein thrombosis in the lower extremity. However, portions of this examination were limited- see technologist comments above.  *See table(s) above for measurements and observations. Electronically signed by Ruta Hinds MD on 06/27/2020 at 5:33:03 PM.    Final    US Abdomen Limited RUQ (LIVER/GB)  Result Date: 06/26/2020 CLINICAL DATA:  Transaminitis, elevated liver function tests EXAM: ULTRASOUND ABDOMEN LIMITED RIGHT UPPER QUADRANT COMPARISON:  None. FINDINGS: Gallbladder: Extensive sludge and gallstones are seen within the gallbladder lumen. The gallbladder, however, is not distended, there is no gallbladder wall thickening identified, and no pericholecystic fluid is seen. The sonographic Percell Miller sign is reportedly negative. Common bile duct: Diameter: 6-7 mm proximally Liver: There is limited evaluation of the a hepatic dome secondary to overlying osseous and pulmonary structures. The visualized  inferior right and left hepatic lobes demonstrate normal echogenicity and echotexture. No focal intrahepatic masses are seen. There is no intrahepatic biliary ductal dilation portal vein is patent on color Doppler imaging with normal direction of blood flow towards the liver. Other: No ascites IMPRESSION: Cholelithiasis without sonographic evidence of acute cholecystitis. Limited but unremarkable examination of the liver. Electronically Signed   By: Fidela Salisbury MD   On: 06/26/2020 18:31    Cardiac Studies   Echo:  1. Left ventricular ejection fraction, by estimation, is 55 to 60%. The  left ventricle has normal function. The left ventricle has no regional  wall motion abnormalities. Left ventricular diastolic parameters were  normal. There is the interventricular  septum is flattened in systole and diastole, consistent with right  ventricular pressure and volume overload.  2. McConnell's Sign noted. Right ventricular systolic function is normal  in the base. The right ventricular size is moderately enlarged.  3. The mitral valve is grossly normal. No evidence of mitral valve  regurgitation.  4. The aortic valve is tricuspid. Aortic valve regurgitation  is not  visualized. No aortic stenosis is present.  5. There is mild dilatation of the ascending aorta, measuring 41 mm.   Patient Profile     50 y.o. female This is a 50yo female with no known hx of cardiac disease who was found on the bed by staff at at hotel and EMS was called due to [place].  BS was 40 and no improvement with Narcan.  She had hypoxic respiratory failure with hypercarbia and cxary showed multifocal PNA with AKI and SCr of 3.  LFTs were elevated and hypotensive and there was concern for cardiogenic shock.  Co-ox was normal and 2D echo showed normal LVF 55-60% and no RWMAs and RV volume overload.  She was volume resuscitated and is 611+ today. BNP elevated at 315 and ddimer>20.  SCr elevated 3.83.  LE venous dopplers are  pending.  Could not do Chest CTA due to AKI. Cardiology is asked to help with management possible RV failure.    Assessment & Plan    RESPIRATORY FAILURE:   Vent per CCM.    No CoOx yet today.    Oxygenating well with mild vent support.    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  Unable to rule out PE with AKI.  Not a good candidate for VQ.    On heparin.       AKI:  Creat is improving.    ELEVATED TROPONIN:  Demand ischemia.    No ischemia work up at this point.  We will follow as needed .    For questions or updates, please contact [place] Please consult [email or web address] for contact info under Cardiology/STEMI.   Signed, Minus Breeding, MD  06/28/2020, 7:33 AM

## 2020-06-28 NOTE — Progress Notes (Signed)
[place] for IV heparin Indication: PE r/o  Allergies  Allergen Reactions  . 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, unlikely to be positive    Patient Measurements: Height: _0  (167.6 cm) Weight: (!) 201.1 kg (443 lb 5.5 oz) IBW/kg (Calculated) : 59.3 Heparin Dosing Weight: 117 kg  Vital Signs: Temp: 97.4 F (36.3 C) (05/06 2000) Temp Source: Axillary (05/06 2000) BP: 121/69 (05/07 0100) Pulse Rate: 75 (05/06 1500)  Labs: Recent Labs    06/25/20 1515 06/25/20 1524 06/25/20 1715 06/25/20 2043 06/25/20 2156 06/26/20 0320 06/26/20 1429 06/27/20 0444 06/27/20 1606 06/28/20 0021  HGB  --    < > 12.0 15.3*  --  11.5*  --  11.5*  --   --   HCT  --    < > 44.1 45.0  --  41.5  --  38.8  --   --   PLT  --    < > 320  --   --  125* 174 173  --   --   APTT  --   --   --   --   --   --  30  --   --   --   LABPROT  --   --  23.5*  --   --   --  21.0*  21.3*  --   --   --   INR  --   --  2.1*  --   --   --  1.8*  1.9*  --   --   --   HEPARINUNFRC  --   --   --   --   --   --   --   --   --  0.50  CREATININE 3.83*   < >  --   --  3.62* 3.83*  --  3.57*  --   --   CKTOTAL  --   --   --   --  124  --   --   --  153  --   CKMB  --   --   --   --   --   --   --   --  4.9  --   TROPONINIHS 120*  --  205*  --   --   --   --   --  368*  --    < > = values in this interval not displayed.    Estimated Creatinine Clearance: 34.1 mL/min (A) (by C-G formula based on SCr of 3.57 mg/dL (H)).   Medical History: No past medical history on file.  Medications:  No medications prior to admission.   Scheduled:  . chlorhexidine gluconate (MEDLINE KIT)  15 mL Mouth Rinse BID  . Chlorhexidine Gluconate Cloth  6 each Topical Daily  . feeding supplement (PROSource TF)  90 mL Per Tube 5 X Daily  . feeding supplement (VITAL HIGH PROTEIN)  1,000 mL Per Tube Q24H  . mouth rinse  15 mL Mouth Rinse 10 times per day  . multivitamin  15 mL Per Tube Daily  .  pantoprazole (PROTONIX) IV  40 mg Intravenous QHS  . sodium chloride flush  3 mL Intravenous Q12H    Assessment: 49 yoM with unknown PMH admitted 5/4 with AMS d/t hypoglycemia/PNA; now with elevated D-dimer.    Baseline INR mildly elevated (possibly d/t hepatic congestion + shock liver) but decreased to < 2.0; aPTT WNL  Prior anticoagulation: unknown,  however all anticoag markers are now subtherapeutic  06/28/2020  HL 0.5 therapeutic  AKI noted  No bleeding or infusion issues noted  Goal of Therapy: Heparin level 0.3-0.7 units/ml Monitor platelets by anticoagulation protocol: Yes  Plan:  Continue Heparin at 2100 units/hr   Check confirmatory heparin level in 8 hrs   Daily CBC, daily heparin level once stable  Monitor for signs of bleeding or thrombosis  Dolly Rias RPh 06/28/2020, 1:28 AM

## 2020-06-28 NOTE — Progress Notes (Signed)
Pharmacy Brief Note:  Pharmacy received alerts for multiple organisms growing in 5/4 BCx (anaerobic only) - including E faecium, multiple staph species, and C perfringens. 1/2 sets; anaerobic bottle only, drawn from femoral artery  Discussed unusual growth with CCM Dr. Sherene Sires; suspects this may be contamination as patient doing well clinically despite most above pathogens not covered by current Rocephin  Per CCM, will hold off on adjusting abx; will receive auto-ID consult with enterococcus growing in blood and will defer management of abx for these most recent Cx results to their service.  Bernadene Person, PharmD, BCPS [phone] 06/28/2020, 6:10 PM

## 2020-06-28 NOTE — Progress Notes (Signed)
eLink Physician-Brief Progress Note Patient Name: Myriah Kirstein DOB: 01-04-1971 MRN: [id number]   Date of Service  06/28/2020  HPI/Events of Note  Nursing request to renew restraint orders and BMP in AM.  eICU Interventions  Plan: 1. Renew bilateral soft restraint orders X 11 hours. 2. BMP at 5 AM.     Intervention Category Major Interventions: Other:;Electrolyte abnormality - evaluation and management  Lenell Antu 06/28/2020, 9:49 PM

## 2020-06-28 NOTE — Progress Notes (Signed)
NAME:  Felicia Warren, MRN:  [id number], DOB:  October 23, 1970, LOS: 3 ADMISSION DATE:  06/25/2020, CONSULTATION DATE:  06/28/20 REFERRING MD:  EDP, CHIEF COMPLAINT:  AMS   History of Present Illness:  50 y/o F who presented initially as a [name], found with altered mental status in a motel by staff.  It is unknown when her last known well was.  EMS found patient awake, but altered glucose was initially 40.  This was corrected, but she continued to have worsening mental status.  Narcan did not provide any response.  In ED she required nonrebreather mask and eventual intubation for hypoxic respiratory failure.  Chest x-ray with probable multifocal pneumonia.  AKI, elevated INR on admit.  No prior history in epic and no known emergency contact.  Pertinent  Medical History  Obesity ?  OSA  Significant Hospital Events: Including procedures, antibiotic start and stop dates in addition to other pertinent events    5/4 presented to ED with hypoglycemia and AMS, required intubation, PCCM consult. CXR concerning for PNA.  Unasyn initiated. Tx to [facility] for beds. UDS neg, neg tylenol/ salicylates. CTH neg. Lactic acid 5.9-> 4.9 . 5/5 Hypotensive, responded to IVF + albumin, on heparin gtt but stopped due to platelet decline, not on pressors, requiring PEEP 10 / FiO2 80. RUQ [place] with cholelithiasis but no cholecystitis.   Marland Kitchen 5/6 Vent needs decreased. LE venous duplex negative.    Scheduled Meds: . chlorhexidine gluconate (MEDLINE KIT)  15 mL Mouth Rinse BID  . Chlorhexidine Gluconate Cloth  6 each Topical Daily  . feeding supplement (PROSource TF)  90 mL Per Tube 5 X Daily  . feeding supplement (VITAL HIGH PROTEIN)  1,000 mL Per Tube Q24H  . mouth rinse  15 mL Mouth Rinse 10 times per day  . multivitamin  15 mL Per Tube Daily  . pantoprazole (PROTONIX) IV  40 mg Intravenous QHS  . sodium chloride flush  3 mL Intravenous Q12H   Continuous Infusions: . sodium chloride 10 mL/hr at 06/28/20 0400  . cefTRIAXone  (ROCEPHIN)  IV Stopped (06/27/20 2056)  . heparin 1,700 Units/hr (06/28/20 1050)  . lactated ringers 75 mL/hr at 06/28/20 0828  . propofol (DIPRIVAN) infusion 30 mcg/kg/min (06/28/20 0833)   PRN Meds:.sodium chloride, docusate sodium, fentaNYL (SUBLIMAZE) injection, polyethylene glycol, sodium chloride flush  Interim History / Subjective:  Off diprovan, following   commands, attempting PSV wean   Objective   Blood pressure (!) 96/52, pulse 81, temperature 98.1 F (36.7 C), temperature source Axillary, resp. rate 16, height '5\' 6"'  (1.676 m), weight (!) 244 kg, SpO2 100 %.    Vent Mode: PSV FiO2 (%):  [30 %-40 %] 40 % Set Rate:  [24 bmp] 24 bmp Vt Set:  [470 mL] 470 mL PEEP:  [5 cmH20-8 cmH20] 5 cmH20 Pressure Support:  [5 cmH20] 5 cmH20 Plateau Pressure:  [22 cmH20-31 cmH20] 28 cmH20   Intake/Output Summary (Last 24 hours) at 06/28/2020 1111 Last data filed at 06/28/2020 1051 Gross per 24 hour  Intake 4254.52 ml  Output 1325 ml  Net 2929.52 ml   Filed Weights   06/26/20 0149 06/27/20 0500 06/28/20 0500  Weight: (!) 199.6 kg (!) 201.1 kg (!) 244 kg   Exam: General:  Tmax  98.9  General appearance:   Obese bf lying almost flat in bed  No jvd Oropharynx et  Neck supple Lungs with  Minimal insp/ exp rhonchi bilaterally RRR no s3 or or sign murmur Abd obese with  poor excursion  Extr warm with no edema or clubbing noted Neuro  Sensorium appears alert   no apparent motor deficits    I personally reviewed images and agree with radiology impression as follows:  CXR:   5/7 portable 1. Endotracheal tube tip 6.5 cm above the carina. 2. Mild cardiomegaly with diffuse patchy parahilar fluffy opacities, similar, favoring cardiogenic pulmonary edema versus atypical infection. 3. Low lung volumes.  Possible small bilateral pleural effusions.  Labs/imaging that I havepersonally reviewed  (right click and "Reselect all SmartList Selections" daily)     Resolved Hospital Problem list    Hypotension / Shock   Assessment & Plan:   Acute Metabolic Encephalopathy secondary to Hypoglycemia & Hypercapnic Respiratory Failure Unknown past medical history, but would suspect some element of obesity hypoventilation syndrome or OSA given hypercarbia and elevated bicarbonate. CTH, UDS, ammonia negative.  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  -daily SBT / WUA -VAP prevention measures  >>> rocephin 5/4 >>> >>> tol PSV well am 5/7 s may be able to extubate soon    AKI, undetermined acuity AGMA/ lactic acidosis  Lab Results  Component Value Date   CREATININE 2.16 (H) 06/28/2020   CREATININE 3.57 (H) 06/27/2020   CREATININE 3.83 (H) 06/26/2020   - creatinine trending down with ok uop   Elevated Troponin  McConnell's Sign  Suspect in setting of demand, trop hs 120-205. ECHO with LVEF 55-60%, no WMA, McConnell's sign with moderately enlarged RV, interventricular septal flattening concerning for RV overload  -heparin gtt stopped with drop in platelets  (doubt HIT  -given hypercarbia and elevated bicarbonate on ABG, negative LE dopplers would favor RV overload / changes c/w OHS  -unable to do CTA to r/o PE given AKI  -normal LVEF, doubt cardiogenic source of initial shock.      Elevated liver enzymes DDx includes downtime from hypoglycemia / shock, CHF. RUQ [place] negative. Acute hepatitis panel negative.  -  Continuing to improve c/w shock liver >> no further eval needed      Hypoglycemia  Glucose in 40's on admit, no available hx -follow glucose trend  -CBG q4  Thrombocytopenia, resolved  Lab Results  Component Value Date   PLT 170 06/28/2020   PLT 173 06/27/2020   PLT 174 06/26/2020       Best practice (right click and "Reselect all SmartList Selections" daily)  Diet:  NPO, start TF Pain/Anxiety/Delirium protocol (if indicated): Yes (RASS goal  -1) VAP protocol (if indicated): Yes DVT prophylaxis: SCD GI prophylaxis: PPI Glucose control:  SSI Yes Central venous access:  N/A Arterial line:  N/A Foley:  Yes, and it is still needed Mobility:  bed rest  PT consulted: N/A Last date of multidisciplinary goals of care discussion [pending] Code Status:  full code Disposition: ICU  The patient is critically ill with multiple organ systems failure and requires high complexity decision making for assessment and support, frequent evaluation and titration of therapies, application of advanced monitoring technologies and extensive interpretation of multiple databases. Critical Care Time devoted to patient care services described in this note is35 minutes.     Christinia Gully, MD Pulmonary and [place] [phone]   After 7:00 pm call Elink  [phone]

## 2020-06-29 LAB — BASIC METABOLIC PANEL
Anion gap: 13 (ref 5–15)
BUN: 42 mg/dL — ABNORMAL HIGH (ref 6–20)
CO2: 27 mmol/L (ref 22–32)
Calcium: 7.8 mg/dL — ABNORMAL LOW (ref 8.9–10.3)
Chloride: 104 mmol/L (ref 98–111)
Creatinine, Ser: 1.05 mg/dL — ABNORMAL HIGH (ref 0.44–1.00)
GFR, Estimated: >60 mL/min (ref >60)
Glucose, Bld: 116 mg/dL — ABNORMAL HIGH (ref 70–99)
Potassium: 2.9 mmol/L — ABNORMAL LOW (ref 3.5–5.1)
Sodium: 144 mmol/L (ref 135–145)

## 2020-06-29 LAB — CBC
HCT: 38.9 % (ref 36.0–46.0)
Hemoglobin: 11.2 g/dL — ABNORMAL LOW (ref 12.0–15.0)
MCH: 23.9 pg — ABNORMAL LOW (ref 26.0–34.0)
MCHC: 28.8 g/dL — ABNORMAL LOW (ref 30.0–36.0)
MCV: 82.9 fL (ref 80.0–100.0)
Platelets: 153 10*3/uL (ref 150–400)
RBC: 4.69 MIL/uL (ref 3.87–5.11)
RDW: 18.6 % — ABNORMAL HIGH (ref 11.5–15.5)
WBC: 11.3 10*3/uL — ABNORMAL HIGH (ref 4.0–10.5)
nRBC: 0.3 % — ABNORMAL HIGH (ref 0.0–0.2)

## 2020-06-29 LAB — BLOOD GAS, ARTERIAL
Acid-Base Excess: 5.1 mmol/L — ABNORMAL HIGH (ref 0.0–2.0)
Bicarbonate: 31 mmol/L — ABNORMAL HIGH (ref 20.0–28.0)
O2 Saturation: 93.1 %
Patient temperature: 98.6
pCO2 arterial: 54.1 mmHg — ABNORMAL HIGH (ref 32.0–48.0)
pH, Arterial: 7.376 (ref 7.350–7.450)
pO2, Arterial: 71.5 mmHg — ABNORMAL LOW (ref 83.0–108.0)

## 2020-06-29 LAB — CULTURE, BLOOD (ROUTINE X 2)

## 2020-06-29 LAB — GLUCOSE, CAPILLARY
Glucose-Capillary: 103 mg/dL — ABNORMAL HIGH (ref 70–99)
Glucose-Capillary: 76 mg/dL (ref 70–99)
Glucose-Capillary: 77 mg/dL (ref 70–99)
Glucose-Capillary: 84 mg/dL (ref 70–99)
Glucose-Capillary: 86 mg/dL (ref 70–99)
Glucose-Capillary: 88 mg/dL (ref 70–99)
Glucose-Capillary: 89 mg/dL (ref 70–99)
Glucose-Capillary: 89 mg/dL (ref 70–99)

## 2020-06-29 LAB — HEPARIN LEVEL (UNFRACTIONATED): Heparin Unfractionated: 0.4 IU/mL (ref 0.30–0.70)

## 2020-06-29 LAB — TRIGLYCERIDES: Triglycerides: 73 mg/dL (ref <150)

## 2020-06-29 LAB — PROCALCITONIN: Procalcitonin: 0.26 ng/mL

## 2020-06-29 MED ORDER — ORAL CARE MOUTH RINSE
15.0000 mL | Freq: Two times a day (BID) | OROMUCOSAL | Status: DC
  Administered 2020-06-29 – 2020-07-25 (×51): 15 mL via OROMUCOSAL

## 2020-06-29 MED ORDER — DEXTROSE 5 % IV SOLN: INTRAVENOUS | Status: DC

## 2020-06-29 MED ORDER — POTASSIUM CHLORIDE 20 MEQ PO PACK
40.0000 meq | PACK | Freq: Once | ORAL | Status: AC
  Administered 2020-06-29: 40 meq
  Filled 2020-06-29: qty 2

## 2020-06-29 MED ORDER — CALCIUM GLUCONATE-NACL 1-0.675 GM/50ML-% IV SOLN
1.0000 g | Freq: Once | INTRAVENOUS | Status: AC
  Administered 2020-06-29: 1000 mg via INTRAVENOUS
  Filled 2020-06-29: qty 50

## 2020-06-29 NOTE — Progress Notes (Signed)
[place] for IV heparin Indication: presumed PE  No Active Allergies  Patient Measurements: Height: '5\' 6"'  (167.6 cm) Weight: (!) 244.9 kg (540 lb) IBW/kg (Calculated) : 59.3 Heparin Dosing Weight: 117 kg  Vital Signs: Temp: 97.5 F (36.4 C) (05/08 0815) Temp Source: Axillary (05/08 0815) BP: 116/80 (05/08 0815) Pulse Rate: 65 (05/08 0815)  Labs: Recent Labs    06/26/20 1429 06/26/20 1429 06/27/20 0444 06/27/20 1606 06/28/20 0021 06/28/20 0406 06/28/20 0407 06/28/20 0813 06/28/20 1828 06/29/20 0438  HGB  --    < > 11.5*  --   --   --  11.1*  --   --  11.2*  HCT  --   --  38.8  --   --   --  38.1  --   --  38.9  PLT 174  --  173  --   --   --  170  --   --  153  APTT 30  --   --   --   --   --   --   --   --   --   LABPROT 21.0*  21.3*  --   --   --   --   --  16.9*  --   --   --   INR 1.8*  1.9*  --   --   --   --   --  1.4*  --   --   --   HEPARINUNFRC  --   --   --   --    < >  --   --  0.71* 0.45 0.40  CREATININE  --   --  3.57*  --   --   --  2.16*  --   --  1.05*  CKTOTAL  --   --   --  153  --   --   --   --   --   --   CKMB  --   --   --  4.9  --   --   --   --   --   --   TROPONINIHS  --   --   --  368*  --  284*  --   --   --   --    < > = values in this interval not displayed.    Estimated Creatinine Clearance: 133.6 mL/min (A) (by C-G formula based on SCr of 1.05 mg/dL (H)).   Medical History: No past medical history on file.  Medications:  No medications prior to admission.   Scheduled:  . chlorhexidine gluconate (MEDLINE KIT)  15 mL Mouth Rinse BID  . Chlorhexidine Gluconate Cloth  6 each Topical Daily  . feeding supplement (PROSource TF)  90 mL Per Tube 5 X Daily  . feeding supplement (VITAL HIGH PROTEIN)  1,000 mL Per Tube Q24H  . mouth rinse  15 mL Mouth Rinse 10 times per day  . multivitamin  15 mL Per Tube Daily  . pantoprazole (PROTONIX) IV  40 mg Intravenous QHS  . sodium chloride flush  3 mL  Intravenous Q12H    Assessment: 56 yoM with unknown PMH admitted 5/4 with AMS d/t hypoglycemia/PNA; now with elevated D-dimer.   Baseline INR mildly elevated (possibly d/t hepatic congestion + shock liver) but decreased to < 2.0; aPTT WNL  Prior anticoagulation: unknown, however all anticoag markers are now subtherapeutic  06/29/2020  Heparin level continues to be therapeutic  on current IV heparin of rate to 1700 units/hr  CBC - Hgb 11.1 stable and Plts 153 oke  No bleeding or infusion issues noted  Of note, unable to do CT or VQ to officially "rule out" PE  Goal of Therapy: Heparin level 0.3-0.7 units/ml Monitor platelets by anticoagulation protocol: Yes  Plan:  Continue heparin at 1700 units/hr  Daily CBC and heparin level  Monitor for signs of bleeding or thrombosis   Adrian Saran, PharmD, BCPS Secure Chat if ?s 06/29/2020 10:29 AM

## 2020-06-29 NOTE — Progress Notes (Signed)
eLink Physician-Brief Progress Note Patient Name: Geraldyne Mousel DOB: [birth year] MRN: [id number]   Date of Service  06/29/2020  HPI/Events of Note  Multiple issues: 1. Frequent watery stools - Nursing request for Flexiseal and 2. Ca++ = 7.8 which corrects to 8.68 (slightly low) given Albumin = 2.9.  eICU Interventions  Plan: 1. Place Flexiseal. 2. Will replace Ca++     Intervention Category Major Interventions: Other:;Electrolyte abnormality - evaluation and management  Judah Chevere [place] 06/29/2020, 6:08 AM

## 2020-06-29 NOTE — Progress Notes (Signed)
Patient stated sister, [name]'s, phone number was [phone]. Phone call was attempted by this RN, however there was no answer.

## 2020-06-29 NOTE — Evaluation (Signed)
Clinical/Bedside Swallow Evaluation Patient Details  Name: Felicia Warren MRN: [id number] Date of Birth: April 16, 1970  Today's Date: 06/29/2020 Time: SLP Start Time (ACUTE ONLY): 1400 SLP Stop Time (ACUTE ONLY): 1430 SLP Time Calculation (min) (ACUTE ONLY): 30 min  Past Medical History: No past medical history on file. Past Surgical History: The histories are not reviewed yet. Please review them in the "History" navigator section and refresh this SmartLink. HPI:  50 y/o Female who presented as a Felicia Warren, found with altered mental status in a motel by staff.  It was unknown when her last known well was.  EMS found patient awake, but altered glucose was initially 40.  This was corrected, but she continued to have worsening mental status.  Narcan did not provide any response.  In ED she required nonrebreather mask and eventual intubation for hypoxic respiratory failure.  Chest x-ray with probable multifocal pneumonia.  Labs partially completed at the time of admission, but notable for white blood cell count of 15 K, creatinine 3 and INR of 2.1.  Metabolic panel, ABG, troponin, lactic acid CT head all pending.  At the time of exam, patient was sleeping while sedated with propofol, however was arousable to sternal rub and following commands.  Most recent chext xray dated 05/07 was showing mild cardiomegaly with diffuse patchy parahilar fluffy opacities, similar, favoring cardiogenic pulmonary edema vs atypical infection, low lung volumes and possible small bilateral pleural effusions.   CT of the head was showing no acute intracranial abnormality.   Assessment / Plan / Recommendation Clinical Impression  Clinical swallowing evaluation was completed given thin liquids via spoon, cup and straw, pureed material and dry solids in setting of a 4 day intubation.  The patient did not report any issues swallowing prior to admission.  Cranial nerve exam was completed and remarkarkble for reduced jaw opening and  strength.  Lingual, labial and facial range of motion and strength appeared adequate.  Sesnation was unable to be assessed. She presented with a possible oral dysphagia.  Mastication of dry solids was slow, although, no oral residue was seen post swallow.  Swallow trigger was appreciated to palpation and overt s/s of aspiration were not seen.  Patient noted to have some belching with intake suggestive of possible esophageal issues.  Suggest beginning a dysphagia 2 diet with thin liquids.  ST will follow for therapeutic diet tolerance and possible diet advancement. SLP Visit Diagnosis: Dysphagia, oral phase (R13.11)    Aspiration Risk  Mild aspiration risk    Diet Recommendation   Dysphagia 2 with thin liquids  Medication Administration: Crushed with puree    Other  Recommendations Oral Care Recommendations: Oral care BID   Follow up Recommendations Other (comment) (TBD)      Frequency and Duration min 2x/week  2 weeks       Prognosis Prognosis for Safe Diet Advancement: Good      Swallow Study   General Date of Onset: 06/25/20 HPI: 50 y/o Female who presented as a Felicia Warren, found with altered mental status in a motel by staff.  It was unknown when her last known well was.  EMS found patient awake, but altered glucose was initially 40.  This was corrected, but she continued to have worsening mental status.  Narcan did not provide any response.  In ED she required nonrebreather mask and eventual intubation for hypoxic respiratory failure.  Chest x-ray with probable multifocal pneumonia.  Labs partially completed at the time of admission, but notable for white blood  cell count of 15 K, creatinine 3 and INR of 2.1.  Metabolic panel, ABG, troponin, lactic acid CT head all pending.  At the time of exam, patient was sleeping while sedated with propofol, however was arousable to sternal rub and following commands.  Most recent chext xray dated 05/07 was showing mild cardiomegaly with diffuse patchy  parahilar fluffy opacities, similar, favoring cardiogenic pulmonary edema vs atypical infection, low lung volumes and possible small bilateral pleural effusions.   CT of the head was showing no acute intracranial abnormality. Type of Study: Bedside Swallow Evaluation Previous Swallow Assessment: None noted at [facility] Diet Prior to this Study: NPO Temperature Spikes Noted: No Respiratory Status: Nasal cannula History of Recent Intubation: Yes Length of Intubations (days): 4 days Date extubated: 06/29/20 Behavior/Cognition: Alert;Cooperative Oral Cavity Assessment: Within Functional Limits Oral Care Completed by SLP: Yes Oral Cavity - Dentition: Missing dentition;Poor condition Self-Feeding Abilities: Needs assist Patient Positioning: Upright in bed Baseline Vocal Quality: Hoarse;Low vocal intensity Volitional Swallow: Able to elicit    Oral/Motor/Sensory Function Overall Oral Motor/Sensory Function: Within functional limits   Ice Chips Ice chips: Not tested   Thin Liquid Thin Liquid: Within functional limits Presentation: Cup;Spoon;Straw    Nectar Thick Nectar Thick Liquid: Not tested   Honey Thick Honey Thick Liquid: Not tested   Puree Puree: Within functional limits Presentation: Spoon   Solid     Solid: Impaired Presentation: Spoon Oral Phase Impairments: Impaired mastication Oral Phase Functional Implications: Impaired mastication     Dimas Aguas, MA, CCC-SLP Acute Rehab SLP [phone]  Fleet Contras 06/29/2020,2:46 PM

## 2020-06-29 NOTE — Progress Notes (Signed)
NAME:  Felicia Warren, MRN:  [id number], DOB:  1970/11/10, LOS: 4 ADMISSION DATE:  06/25/2020, CONSULTATION DATE:  06/29/20 REFERRING MD:  EDP, CHIEF COMPLAINT:  AMS   History of Present Illness:  50 y/o F who presented initially as a Felicia Warren, found with altered mental status in a motel by staff.  It is unknown when her last known well was.  EMS found patient awake, but altered glucose was initially 40.  This was corrected, but she continued to have worsening mental status.  Narcan did not provide any response.  In ED she required nonrebreather mask and eventual intubation for hypoxic respiratory failure.  Chest x-ray with probable multifocal pneumonia.  AKI, elevated INR on admit.  No prior history in epic and no known emergency contact.  Pertinent  Medical History  Obesity ?  OSA  Significant Hospital Events: Including procedures, antibiotic start and stop dates in addition to other pertinent events    5/4 presented to ED with hypoglycemia and AMS, required intubation, PCCM consult. CXR concerning for PNA.  Unasyn initiated. Tx to [facility] beds. UDS neg, neg tylenol/ salicylates. CTH neg. Lactic acid 5.9-> 4.9 . 5/5 Hypotensive, responded to IVF + albumin, on heparin gtt but stopped due to platelet decline, not on pressors, requiring PEEP 10 / FiO2 80. RUQ [place] with cholelithiasis but no cholecystitis.   Marland Kitchen 5/6 Vent needs decreased. LE venous duplex negative.  . 5/7 diarrhea > Placed flexiseal 5/8   Scheduled Meds: . chlorhexidine gluconate (MEDLINE KIT)  15 mL Mouth Rinse BID  . Chlorhexidine Gluconate Cloth  6 each Topical Daily  . feeding supplement (PROSource TF)  90 mL Per Tube 5 X Daily  . feeding supplement (VITAL HIGH PROTEIN)  1,000 mL Per Tube Q24H  . mouth rinse  15 mL Mouth Rinse 10 times per day  . multivitamin  15 mL Per Tube Daily  . pantoprazole (PROTONIX) IV  40 mg Intravenous QHS  . sodium chloride flush  3 mL Intravenous Q12H   Continuous Infusions: . sodium chloride 250  mL (06/29/20 0644)  . cefTRIAXone (ROCEPHIN)  IV Stopped (06/28/20 2033)  . heparin 1,700 Units/hr (06/29/20 0400)  . lactated ringers 75 mL/hr at 06/29/20 0400  . propofol (DIPRIVAN) infusion 40 mcg/kg/min (06/29/20 0719)   PRN Meds:.sodium chloride, docusate sodium, fentaNYL (SUBLIMAZE) injection, polyethylene glycol, sodium chloride flush    Interim History / Subjective:  In process of WUA anticipating PSV > extubate this am   Objective   Blood pressure 116/80, pulse 65, temperature 97.8 F (36.6 C), temperature source Axillary, resp. rate (!) 24, height $RemoveBe'5\' 6"'QvXJrPvCY$  (1.676 m), weight (!) 244.9 kg, SpO2 100 %.    Vent Mode: PRVC FiO2 (%):  [40 %] 40 % Set Rate:  [24 bmp] 24 bmp Vt Set:  [470 mL] 470 mL PEEP:  [5 cmH20-8 cmH20] 8 cmH20 Pressure Support:  [5 cmH20] 5 cmH20 Plateau Pressure:  [24 cmH20-26 cmH20] 26 cmH20   Intake/Output Summary (Last 24 hours) at 06/29/2020 0833 Last data filed at 06/29/2020 0555 Gross per 24 hour  Intake 3331.07 ml  Output 1950 ml  Net 1381.07 ml   Filed Weights   06/27/20 0500 06/28/20 0500 06/29/20 0440  Weight: (!) 201.1 kg (!) 244 kg (!) 244.9 kg   Exam:   Tmax  98.2  General appearance:    Obese bm nad   No jvd Oropharynx et/ og  Neck supple Lungs with a few scattered exp > insp rhonchi bilaterally RRR  no s3 or or sign murmur Abd obese limited excursion/ diarrhea   Extr warm with no edema or clubbing noted Neuro  Squeezes fingers to request      Labs/imaging that I havepersonally reviewed  (right click and "Reselect all SmartList Selections" daily)     Resolved Hospital Problem list   Hypotension / Shock   Assessment & Plan:   Acute Metabolic Encephalopathy secondary to Hypoglycemia & Hypercapnic Respiratory Failure Unknown past medical history, but would suspect some element of obesity hypoventilation syndrome or OSA given hypercarbia and elevated bicarbonate. CTH, UDS, ammonia negative.  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  BC one out of 2   from 5/5 growing muliple species (fem site)  -daily SBT / WUA -VAP prevention measures  >>> rocephin 5/4 >>> >>> tol PSV relatively well am 5/7 s may be able to extubate soon    AKI, undetermined acuity AGMA/ lactic acidosis  Lab Results  Component Value Date   CREATININE 1.05 (H) 06/29/2020   CREATININE 2.16 (H) 06/28/2020   CREATININE 3.57 (H) 06/27/2020   - creatinine trending down with ok uop   Elevated Troponin  McConnell's Sign  Suspect in setting of demand, trop hs 120-205. ECHO with LVEF 55-60%, no WMA, McConnell's sign with moderately enlarged RV, interventricular septal flattening concerning for RV overload  -heparin gtt stopped with drop in platelets  (doubt HIT  -given hypercarbia and elevated bicarbonate on ABG, negative LE dopplers  favor RV overload / changes c/w OHS  -unable to do CTA to r/o PE given AKI  -normal LVEF, doubt cardiogenic source of initial shock.   >>> hemodynamics fine so continue empiric heparin     Elevated liver enzymes DDx includes downtime from hypoglycemia / shock, CHF. RUQ [place] negative. Acute hepatitis panel negative.  -  Continuing to improve c/w shock liver >> no further eval needed      Hypoglycemia  Glucose in 40's on admit, no available hx -follow glucose trend  -CBG q4  Thrombocytopenia, resolved  Lab Results  Component Value Date   PLT 153 06/29/2020   PLT 170 06/28/2020   PLT 173 06/27/2020       Best practice (right click and "Reselect all SmartList Selections" daily)  Diet:  NPO, start TF Pain/Anxiety/Delirium protocol (if indicated): Yes (RASS goal -1) VAP protocol (if indicated): Yes DVT prophylaxis: Systemic AC GI prophylaxis: PPI Glucose control:  SSI Yes Central venous access:  N/A Arterial line:  N/A Foley:  Yes, and it is still  needed Mobility:  bed rest  PT consulted: N/A Last date of multidisciplinary goals of care discussion [pending] Code Status:  full code Disposition: ICU   The patient is critically ill with multiple organ systems failure and requires high complexity decision making for assessment and support, frequent evaluation and titration of therapies, application of advanced monitoring technologies and extensive interpretation of multiple databases. Critical Care Time devoted to patient care services described in this note is 35 minutes.    Christinia Gully, MD Pulmonary and [place] [phone]   After 7:00 pm call Elink  [phone]

## 2020-06-29 NOTE — Procedures (Signed)
Extubation Procedure Note  Patient Details:   Name: Felicia Warren DOB: [birth year] MRN: [id number]   Airway Documentation:  Airway 7.5 mm (Active)  Secured at (cm) 20 cm 06/29/20 0915  Measured From Lips 06/29/20 0915  Secured Location Right 06/29/20 0915  Secured By [employer] 06/29/20 0915  Tube Holder Repositioned Yes 06/29/20 0815  Prone position No 06/29/20 0815  Cuff Pressure (cm H2O) Green OR 18-26 CmH2O 06/29/20 0815  Site Condition Dry 06/27/20 1551   Vent end date: (not recorded) Vent end time: (not recorded)   Evaluation  O2 sats: stable throughout Complications: No apparent complications Patient did tolerate procedure well. Bilateral Breath Sounds: Diminished   Yes  [name] 06/29/2020, 11:51 AM

## 2020-06-30 ENCOUNTER — Inpatient Hospital Stay (HOSPITAL_COMMUNITY): Payer: Self-pay

## 2020-06-30 DIAGNOSIS — R7881 Bacteremia: Secondary | ICD-10-CM

## 2020-06-30 DIAGNOSIS — E162 Hypoglycemia, unspecified: Secondary | ICD-10-CM

## 2020-06-30 DIAGNOSIS — G928 Other toxic encephalopathy: Secondary | ICD-10-CM

## 2020-06-30 LAB — CBC
HCT: 37.9 % (ref 36.0–46.0)
Hemoglobin: 10.7 g/dL — ABNORMAL LOW (ref 12.0–15.0)
MCH: 24.1 pg — ABNORMAL LOW (ref 26.0–34.0)
MCHC: 28.2 g/dL — ABNORMAL LOW (ref 30.0–36.0)
MCV: 85.4 fL (ref 80.0–100.0)
Platelets: 145 10*3/uL — ABNORMAL LOW (ref 150–400)
RBC: 4.44 MIL/uL (ref 3.87–5.11)
RDW: 18.5 % — ABNORMAL HIGH (ref 11.5–15.5)
WBC: 13.1 10*3/uL — ABNORMAL HIGH (ref 4.0–10.5)
nRBC: 0 % (ref 0.0–0.2)

## 2020-06-30 LAB — BASIC METABOLIC PANEL
Anion gap: 4 — ABNORMAL LOW (ref 5–15)
Anion gap: 8 (ref 5–15)
BUN: 29 mg/dL — ABNORMAL HIGH (ref 6–20)
BUN: 25 mg/dL — ABNORMAL HIGH (ref 6–20)
CO2: 35 mmol/L — ABNORMAL HIGH (ref 22–32)
CO2: 32 mmol/L (ref 22–32)
Calcium: 7.8 mg/dL — ABNORMAL LOW (ref 8.9–10.3)
Calcium: 8.4 mg/dL — ABNORMAL LOW (ref 8.9–10.3)
Chloride: 109 mmol/L (ref 98–111)
Chloride: 106 mmol/L (ref 98–111)
Creatinine, Ser: 0.56 mg/dL (ref 0.44–1.00)
Creatinine, Ser: 0.74 mg/dL (ref 0.44–1.00)
GFR, Estimated: >60 mL/min (ref >60)
GFR, Estimated: >60 mL/min (ref >60)
Glucose, Bld: 96 mg/dL (ref 70–99)
Glucose, Bld: 83 mg/dL (ref 70–99)
Potassium: 2.9 mmol/L — ABNORMAL LOW (ref 3.5–5.1)
Potassium: 4.2 mmol/L (ref 3.5–5.1)
Sodium: 148 mmol/L — ABNORMAL HIGH (ref 135–145)
Sodium: 146 mmol/L — ABNORMAL HIGH (ref 135–145)

## 2020-06-30 LAB — MAGNESIUM: Magnesium: 1.9 mg/dL (ref 1.7–2.4)

## 2020-06-30 LAB — GLUCOSE, CAPILLARY
Glucose-Capillary: 71 mg/dL (ref 70–99)
Glucose-Capillary: 76 mg/dL (ref 70–99)
Glucose-Capillary: 78 mg/dL (ref 70–99)
Glucose-Capillary: 84 mg/dL (ref 70–99)
Glucose-Capillary: 86 mg/dL (ref 70–99)
Glucose-Capillary: 88 mg/dL (ref 70–99)
Glucose-Capillary: 91 mg/dL (ref 70–99)

## 2020-06-30 LAB — HEPARIN LEVEL (UNFRACTIONATED): Heparin Unfractionated: 0.54 IU/mL (ref 0.30–0.70)

## 2020-06-30 MED ORDER — POTASSIUM CHLORIDE CRYS ER 20 MEQ PO TBCR
40.0000 meq | EXTENDED_RELEASE_TABLET | Freq: Once | ORAL | Status: AC
  Administered 2020-06-30: 40 meq via ORAL
  Filled 2020-06-30: qty 2

## 2020-06-30 MED ORDER — SODIUM CHLORIDE (PF) 0.9 % IJ SOLN
INTRAMUSCULAR | Status: AC
  Filled 2020-06-30: qty 50

## 2020-06-30 MED ORDER — LACTATED RINGERS IV BOLUS
1000.0000 mL | Freq: Once | INTRAVENOUS | Status: AC
  Administered 2020-06-30: 1000 mL via INTRAVENOUS

## 2020-06-30 MED ORDER — IOHEXOL 350 MG/ML SOLN
100.0000 mL | Freq: Once | INTRAVENOUS | Status: AC | PRN
  Administered 2020-06-30: 100 mL via INTRAVENOUS

## 2020-06-30 MED ORDER — POTASSIUM CHLORIDE 10 MEQ/50ML IV SOLN
10.0000 meq | INTRAVENOUS | Status: DC
Start: 2020-06-30 — End: 2020-06-30
  Administered 2020-06-30: 10 meq via INTRAVENOUS
  Filled 2020-06-30: qty 50

## 2020-06-30 MED ORDER — POTASSIUM CHLORIDE 10 MEQ/50ML IV SOLN
10.0000 meq | INTRAVENOUS | Status: AC
  Administered 2020-06-30 (×3): 10 meq via INTRAVENOUS
  Filled 2020-06-30 (×3): qty 50

## 2020-06-30 MED ORDER — ADULT MULTIVITAMIN W/MINERALS CH
1.0000 | ORAL_TABLET | Freq: Every day | ORAL | Status: DC
  Administered 2020-06-30 – 2020-07-25 (×26): 1 via ORAL
  Filled 2020-06-30 (×26): qty 1

## 2020-06-30 NOTE — Progress Notes (Signed)
CTA reviewed with Radiology > difficult to discern but questionable LLL segmental PE.  Doubt this explains all of her hypoxia on admit.  Will continue heparin infusion with potential transition to oral anticoagulation in next few days (will need to verify benefits / medication coverage).    Canary Brim, MSN, APRN, NP-C, AGACNP-BC  Pulmonary & Critical Care 06/30/2020, 3:30 PM   Please see [email or web address] for pager details.   From 7A-7P if no response, please call [phone] After hours, please call ELink [phone]

## 2020-06-30 NOTE — Consult Note (Signed)
[facility]       Reason for Consult: Enterococcus positive blood culture    Referring Physician: CHAMP autoconsult  Active Problems:   Hypercapnic respiratory failure (HCC)   Acute renal failure (HCC)   Acute respiratory failure with hypoxia and hypercarbia (HCC)   Elevated LFTs   Elevated troponin   . Chlorhexidine Gluconate Cloth  6 each Topical Daily  . mouth rinse  15 mL Mouth Rinse BID  . multivitamin with minerals  1 tablet Oral Daily  . pantoprazole (PROTONIX) IV  40 mg Intravenous QHS  . sodium chloride (PF)        Recommendations: No additional antibiotic treatment indicated at this time  Will continue to follow the blood cultures  Assessment: She has one positive blood culture in 1 bottle of 4 with multiple organisms including E faecium, Staph epidermidis and simulans and Clostridium perfringens.  Other blood cultures have remained negative.  She has otherwise been afebrile with a minimal increase in the WBC.  It is unlikely that one bottle with multiple organisms like this is significant and most c/w a contaminate.    Antibiotics: Ceftriaxone day 6/7  HPI: Felicia Warren is a 50 y.o. female who lives in a motel and found down with a blood glucose of 40.  She does not remember events.  On bipap. She was intubated for respiratory failure and now is seen extubated.  Multi focal opacities noted on CXR.  No known PMH.  WBC max of 14.3.    Review of Systems:  Constitutional: negative for fevers and chills Gastrointestinal: negative for diarrhea All other systems reviewed and are negative    PMH: possible OSA, obesity     FMH: unknown from the patient  No Active Allergies  Physical Exam: Constitutional: in no apparent distress  Vitals:   06/30/20 1207 06/30/20 1221  BP:    Pulse:  91  Resp:  (!) 27  Temp: 97.6 F (36.4 C)   SpO2:  100%   EYES: anicteric ENMT: + bipap in place Cardiovascular: Cor RRR Respiratory:  clear; Musculoskeletal: no pedal edema noted Skin: negatives: no rash   Lab Results  Component Value Date   WBC 13.1 (H) 06/30/2020   HGB 10.7 (L) 06/30/2020   HCT 37.9 06/30/2020   MCV 85.4 06/30/2020   PLT 145 (L) 06/30/2020    Lab Results  Component Value Date   CREATININE 0.56 06/30/2020   BUN 29 (H) 06/30/2020   NA 148 (H) 06/30/2020   K 2.9 (L) 06/30/2020   CL 109 06/30/2020   CO2 35 (H) 06/30/2020    Lab Results  Component Value Date   ALT 574 (H) 06/28/2020   AST 382 (H) 06/28/2020   ALKPHOS 71 06/28/2020     Microbiology: Recent Results (from the past 240 hour(s))  Resp Panel by RT-PCR (Flu A&B, Covid) Nasopharyngeal Swab     Status: None   Collection Time: 06/25/20  3:15 PM   Specimen: Nasopharyngeal Swab; Nasopharyngeal(NP) swabs in vial transport medium  Result Value Ref Range Status   SARS Coronavirus 2 by RT PCR NEGATIVE NEGATIVE Final    Comment: (NOTE) SARS-CoV-2 target nucleic acids are NOT DETECTED.  The SARS-CoV-2 RNA is generally detectable in upper respiratory specimens during the acute phase of infection. The lowest concentration of SARS-CoV-2 viral copies this assay can detect is 138 copies/mL. A negative result does not preclude SARS-Cov-2 infection and should not be used as the sole basis for treatment  or other patient management decisions. A negative result may occur with  improper specimen collection/handling, submission of specimen other than nasopharyngeal swab, presence of viral mutation(s) within the areas targeted by this assay, and inadequate number of viral copies(<138 copies/mL). A negative result must be combined with clinical observations, patient history, and epidemiological information. The expected result is Negative.  Fact Sheet for Patients:  BloggerCourse.com  Fact Sheet for Healthcare Providers:  SeriousBroker.it  This test is no t yet approved or cleared by the  [place] FDA and  has been authorized for detection and/or diagnosis of SARS-CoV-2 by FDA under an Emergency Use Authorization (EUA). This EUA will remain  in effect (meaning this test can be used) for the duration of the COVID-19 declaration under Section 564(b)(1) of the Act, 21 U.S.C.section 360bbb-3(b)(1), unless the authorization is terminated  or revoked sooner.       Influenza A by PCR NEGATIVE NEGATIVE Final   Influenza B by PCR NEGATIVE NEGATIVE Final    Comment: (NOTE) The Xpert Xpress SARS-CoV-2/FLU/RSV plus assay is intended as an aid in the diagnosis of influenza from Nasopharyngeal swab specimens and should not be used as a sole basis for treatment. Nasal washings and aspirates are unacceptable for Xpert Xpress SARS-CoV-2/FLU/RSV testing.  Fact Sheet for Patients: BloggerCourse.com  Fact Sheet for Healthcare Providers: SeriousBroker.it  This test is not yet approved or cleared by the [place] FDA and has been authorized for detection and/or diagnosis of SARS-CoV-2 by FDA under an Emergency Use Authorization (EUA). This EUA will remain in effect (meaning this test can be used) for the duration of the COVID-19 declaration under Section 564(b)(1) of the Act, 21 U.S.C. section 360bbb-3(b)(1), unless the authorization is terminated or revoked.  Performed at [facility], [address], [place], [place] [address]   Culture, blood (routine x 2)     Status: Abnormal   Collection Time: 06/25/20  4:45 PM   Specimen: BLOOD  Result Value Ref Range Status   Specimen Description BLOOD FEMORAL ARTERY  Final   Special Requests   Final    BOTTLES DRAWN AEROBIC AND ANAEROBIC Blood Culture results may not be optimal due to an excessive volume of blood received in culture bottles   Culture  Setup Time   Final    GRAM POSITIVE RODS ANAEROBIC BOTTLE ONLY CRITICAL RESULT CALLED TO, READ BACK BY AND VERIFIED WITH:  A. PHAM PHARMD, AT 6045 06/26/20 D. Leighton Roach    Culture (A)  Final    ENTEROCOCCUS FAECIUM STAPHYLOCOCCUS EPIDERMIDIS STAPHYLOCOCCUS SIMULANS THE SIGNIFICANCE OF ISOLATING THIS ORGANISM FROM A SINGLE SET OF BLOOD CULTURES WHEN MULTIPLE SETS ARE DRAWN IS UNCERTAIN. PLEASE NOTIFY THE MICROBIOLOGY DEPARTMENT WITHIN ONE WEEK IF SPECIATION AND SENSITIVITIES ARE REQUIRED. CLOSTRIDIUM PERFRINGENS Standardized susceptibility testing for this organism is not available. Performed at [facility], [address] [place], [place] [address]    Report Status 06/29/2020 FINAL  Final   Organism ID, Bacteria ENTEROCOCCUS FAECIUM  Final      Susceptibility   Enterococcus faecium - MIC*    AMPICILLIN <=2 SENSITIVE Sensitive     VANCOMYCIN <=0.5 SENSITIVE Sensitive     GENTAMICIN SYNERGY SENSITIVE Sensitive     * ENTEROCOCCUS FAECIUM  MRSA PCR Screening     Status: None   Collection Time: 06/25/20 10:00 PM   Specimen: Nasal Mucosa; Nasopharyngeal  Result Value Ref Range Status   MRSA by PCR NEGATIVE NEGATIVE Final    Comment:  The GeneXpert MRSA Assay (FDA approved for NASAL specimens only), is one component of a comprehensive MRSA colonization surveillance program. It is not intended to diagnose MRSA infection nor to guide or monitor treatment for MRSA infections. Performed at [facility], [address], [place], [place] [address]   Culture, blood (routine x 2)     Status: None (Preliminary result)   Collection Time: 06/26/20  3:20 AM   Specimen: BLOOD  Result Value Ref Range Status   Specimen Description   Final    BLOOD RIGHT ANTECUBITAL Performed at [facility], [address], [place], [place] [address]    Special Requests   Final    BOTTLES DRAWN AEROBIC ONLY Blood Culture adequate volume Performed at [facility], [address], [place], [place] [address]    Culture   Final    NO GROWTH 4 DAYS Performed at  [facility], [address] [place], [place] [address]    Report Status PENDING  Incomplete  Culture, Respiratory w Gram Stain     Status: None   Collection Time: 06/26/20  8:19 AM   Specimen: Tracheal Aspirate; Respiratory  Result Value Ref Range Status   Specimen Description   Final    TRACHEAL ASPIRATE Performed at [facility], [address] [place], [place] [address]    Special Requests   Final    NONE Performed at [facility], [address], [place], [place] [address]    Gram Stain   Final    ABUNDANT WBC PRESENT,BOTH PMN AND MONONUCLEAR NO ORGANISMS SEEN    Culture   Final    Normal respiratory flora-no Staph aureus or Pseudomonas seen Performed at [facility], [address] [place], [place] [address]    Report Status 06/28/2020 FINAL  Final    Gardiner Barefoot, MD [facility] [email or web address] 06/30/2020, 2:59 PM

## 2020-06-30 NOTE — Progress Notes (Addendum)
NAME:  Felicia Warren, MRN:  [id number], DOB:  1970/07/16, LOS: 5 ADMISSION DATE:  06/25/2020, CONSULTATION DATE:  06/30/20 REFERRING MD:  EDP, CHIEF COMPLAINT:  AMS   History of Present Illness:  50 y/o F who presented initially as a Felicia Warren, found with altered mental status in a motel by staff.  It is unknown when her last known well was.  EMS found patient awake, but altered with a glucose of 40.  This was corrected, but she continued to have worsening mental status.  Narcan did not provide any response.  In ED she required nonrebreather mask and eventual intubation for hypoxic respiratory failure.  Chest x-ray with probable multifocal pneumonia.  AKI, elevated INR on admit.  No prior history in epic and no known emergency contact.  Pertinent  Medical History  Obesity ?  OSA  Significant Hospital Events: Including procedures, antibiotic start and stop dates in addition to other pertinent events    5/4 presented to ED with hypoglycemia and AMS, required intubation, PCCM consult. CXR concerning for PNA.  Unasyn initiated. Tx to [facility] for beds. UDS neg, neg tylenol/ salicylates. CTH neg. Lactic acid 5.9-> 4.9 . 5/5 Hypotensive, responded to IVF + albumin, on heparin gtt but stopped due to platelet decline, not on pressors, requiring PEEP 10 / FiO2 80. RUQ [place] with cholelithiasis but no cholecystitis.   Marland Kitchen 5/6 Vent needs decreased. LE venous duplex negative.  . 5/7 diarrhea > Placed flexiseal 5/8 . 5/8 WUA/SBT, extubated   Interim History / Subjective:  Afebrile / WBC 13.1  Extubated 5/8, wore bipap overnight 30% FiO2 Glucose range 76-96 I/O 1.2L UOP, +1.8L in last 24 hours  Objective   Blood pressure (!) 121/45, pulse 78, temperature 98.4 F (36.9 C), temperature source Axillary, resp. rate (!) 22, height 5\' 6"  (1.676 m), weight (!) 244.5 kg, SpO2 95 %.    Vent Mode: BIPAP;PCV FiO2 (%):  [30 %-40 %] 30 % Set Rate:  [12 bmp-24 bmp] 12 bmp Vt Set:  [470 mL] 470 mL PEEP:  [5 cmH20-8 cmH20]  5 cmH20 Pressure Support:  [5 cmH20] 5 cmH20 Plateau Pressure:  [26 cmH20] 26 cmH20   Intake/Output Summary (Last 24 hours) at 06/30/2020 0711 Last data filed at 06/30/2020 08/30/2020 Gross per 24 hour  Intake 3108.47 ml  Output 1250 ml  Net 1858.47 ml   Filed Weights   06/28/20 0500 06/29/20 0440 06/30/20 0500  Weight: (!) 244 kg (!) 244.9 kg (!) 244.5 kg   Exam: General: morbidly obese female lying in bed in NAD   HEENT: MM pink/moist, [place] O2, pupils 90mm, poor dentition / multiple broken teeth Neuro: Awake, alert, oriented to self, city > indicates she was here visiting her family but does not remember many details of the day she came in to the hospital, MAE CV: s1s2 RRR, no m/r/g PULM: non-labored on [place] O2, lungs bilaterally clear anterior, difficult to assess due to body habitus  GI: soft, bsx4 active  Extremities: warm/dry, no appreciable edema but difficult to assess due to obesity  Skin: no rashes or lesions    Labs/imaging that I havepersonally reviewed  (right click and "Reselect all SmartList Selections" daily)  PCXR 5/9 > low lung volumes, vascular congestion, R IJ in good position, cardiomegaly  BMP  CBC Heparin level   Resolved Hospital Problem list   Hypotension / Shock  Thrombocytopenia  Assessment & Plan:   Acute Metabolic Encephalopathy secondary to Hypoglycemia & Hypercapnic Respiratory Failure Unknown past medical history,  but would suspect some element of obesity hypoventilation syndrome or OSA given hypercarbia and elevated bicarbonate. CTH, UDS, ammonia negative.  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 BC 1 of 2 from 5/5 growing muliple species (fem site), doubt active infection.  -rocephin 6/6, stop 5/9 -pulmonary hygiene  -will need outpatient sleep study > she lives in [place]    AKI, undetermined acuity AGMA/ lactic acidosis  Hypokalemia  -Trend BMP / urinary output -Replace electrolytes as indicated, KCL 5/9  -Avoid nephrotoxic agents, ensure adequate renal perfusion  Elevated Troponin  McConnell's Sign  D-Dimer >20 on admit. Suspect in setting of demand, trop hs 120-205. ECHO with LVEF 55-60%, no WMA, McConnell's sign with moderately enlarged RV, interventricular septal flattening concerning for RV overload, ? If this is largely related to sequela of OHS with hypercarbia / elevated serum bicarbonate and negative LE dopplers.  -assess CT PE protocol now that renal function recovered to r/o PE.  Doubt given LE dopplers negative. 1L LR bolus given contrast administration.  -continue heparin gtt for now   Elevated liver enzymes DDx includes downtime from hypoglycemia / shock, CHF. RUQ [place] negative. Acute hepatitis panel negative.  -monitor trend intermittently, improving -likely from shock liver    Hypoglycemia  Glucose in 40's on admit, no available hx -continue D5w, reduce rate to 50 ml/hr  Diarrhea  Likely in setting of TF + bowel regimen  -PRN colace, miralax   Anemia  -trend CBC  -transfuse for Hgb <7%    Best practice (right click and "Reselect all SmartList Selections" daily)  Diet:  Oral   Pain/Anxiety/Delirium protocol (if indicated): No VAP protocol (if indicated): Not indicated DVT prophylaxis: Systemic AC GI prophylaxis: PPI Glucose control:  SSI Yes Central venous access:  Yes, and it is still needed Arterial line:  N/A Foley:  N/A Mobility:  bed rest  PT consulted: N/A Last date of multidisciplinary goals of care discussion: patient updated on plan of care 5/9 Code Status:  full code Disposition: SDU, to TRH as of 5/10     Felicia Brim, MSN, APRN, NP-C, AGACNP-BC [facility] 06/30/2020, 7:11 AM   Please see [email or web address] for pager details.   From 7A-7P if no response, please call [phone] After  hours, please call ELink [phone]

## 2020-06-30 NOTE — TOC Progression Note (Signed)
Transition of Care [facility]) - Progression Note    Patient Details  Name: Felicia Warren MRN: [id number] Date of Birth: Mar 23, 1970  [facility]) CM/SW Contact  Golda Acre, RN Phone Number: 06/30/2020, 9:49 AM  Clinical Narrative:    50 y/o F who presented initially as a [name], found with altered mental status in a motel by staff.  It is unknown when her last known well was.  EMS found patient awake, but altered with a glucose of 40.  This was corrected, but she continued to have worsening mental status.  Narcan did not provide any response.  In ED she required nonrebreather mask and eventual intubation for hypoxic respiratory failure.  Chest x-ray with probable multifocal pneumonia.  AKI, elevated INR on admit.  No prior history in epic and no known emergency contact.  Pertinent  Medical History  Obesity ?  OSA  Significant Hospital Events: Including procedures, antibiotic start and stop dates in addition to other pertinent events    5/4 presented to ED with hypoglycemia and AMS, required intubation, PCCM consult. CXR concerning for PNA.  Unasyn initiated. Tx to [facility] for beds. UDS neg, neg tylenol/ salicylates. CTH neg. Lactic acid 5.9-> 4.9  5/5 Hypotensive, responded to IVF + albumin, on heparin gtt but stopped due to platelet decline, not on pressors, requiring PEEP 10 / FiO2 80. RUQ [place] with cholelithiasis but no cholecystitis.    5/6 Vent needs decreased. LE venous duplex negative.   5/7 diarrhea > Placed flexiseal 5/8  5/8 WUA/SBT, extubated  PLAN: following for progression and toc needs.     Expected Discharge Plan: Homeless Shelter Barriers to Discharge: Continued Medical Work up  Expected Discharge Plan and Services Expected Discharge Plan: Homeless Shelter       Living arrangements for the past 2 months: Hotel/Motel                                       Social Determinants of Health (SDOH) Interventions    Readmission Risk  Interventions No flowsheet data found.

## 2020-06-30 NOTE — Progress Notes (Signed)
SLP Cancellation Note  Patient Details Name: Felicia Warren MRN: [id number] DOB: [birth year]   Cancelled treatment:       Reason Eval/Treat Not Completed: Medical issues which prohibited therapy. Per RN, patient is still drowsy/lethargic and oxygen is desaturating when not on Bipap. SLP will follow next date for readiness of patient to trial upgraded PO's.   Angela Nevin, MA, CCC-SLP Speech Therapy

## 2020-06-30 NOTE — Progress Notes (Signed)
K 2.9 Replaced per protocol 

## 2020-06-30 NOTE — Progress Notes (Addendum)
Progress Note  Patient Name: Felicia Warren Date of Encounter: 06/30/2020  [facility] Cardiologist: Armanda Magic, MD new  Subjective   Extubated to BiPAP - denies chest pain. Could not tell me anything regarding symptoms prior to being found down.   Inpatient Medications    Scheduled Meds: . Chlorhexidine Gluconate Cloth  6 each Topical Daily  . mouth rinse  15 mL Mouth Rinse BID  . multivitamin with minerals  1 tablet Oral Daily  . pantoprazole (PROTONIX) IV  40 mg Intravenous QHS   Continuous Infusions: . cefTRIAXone (ROCEPHIN)  IV Stopped (06/29/20 2149)  . dextrose 50 mL/hr at 06/30/20 0900  . heparin 1,700 Units/hr (06/30/20 0900)  . lactated ringers    . potassium chloride 10 mEq (06/30/20 0947)   PRN Meds: docusate sodium, polyethylene glycol   Vital Signs    Vitals:   06/30/20 0814 06/30/20 0853 06/30/20 0900 06/30/20 0918  BP:   119/76   Pulse:  78    Resp:  (!) 24 (!) 28 (!) 26  Temp: 97.8 F (36.6 C)     TempSrc: Oral     SpO2:  (!) 81%    Weight:      Height:        Intake/Output Summary (Last 24 hours) at 06/30/2020 1034 Last data filed at 06/30/2020 0900 Gross per 24 hour  Intake 2636.25 ml  Output 1250 ml  Net 1386.25 ml   Last 3 Weights 06/30/2020 06/29/2020 06/28/2020  Weight (lbs) 539 lb 540 lb 538 lb  Weight (kg) 244.489 kg 244.942 kg 244.035 kg      Telemetry    Sinus rhythm with HR 70-90s - Personally Reviewed  ECG    No new tracings - Personally Reviewed  Physical Exam   GEN: severe morbid obesity Neck: No JVD - exam difficutl Cardiac: RRR, no murmurs, rubs, or gallops.  Respiratory: right lung fields without crackles, left anterior lung exam with respirations GI: Soft, nontender, non-distended  MS: 2+ B LE edema; No deformity. Neuro:  Nonfocal  Psych: Normal affect   Labs    High Sensitivity Troponin:   Recent Labs  Lab 06/25/20 1515 06/25/20 1715 06/27/20 1606 06/28/20 0406  TROPONINIHS 120* 205* 368* 284*       Chemistry Recent Labs  Lab 06/26/20 0959 06/27/20 0444 06/28/20 0407 06/29/20 0438 06/30/20 0547  NA  --  143 143 144 148*  K  --  3.1* 2.9* 2.9* 2.9*  CL  --  99 100 104 109  CO2  --  26 28 27  35*  GLUCOSE  --  125* 120* 116* 96  BUN  --  61* 56* 42* 29*  CREATININE  --  3.57* 2.16* 1.05* 0.56  CALCIUM  --  7.7* 7.5* 7.8* 7.8*  PROT 7.0 6.6 6.4*  --   --   ALBUMIN 3.3* 3.4* 2.9*  --   --   AST 2,160* 1,003* 382*  --   --   ALT 912* 757* 574*  --   --   ALKPHOS 88 86 71  --   --   BILITOT 2.6* 1.7* 1.2  --   --   GFRNONAA  --  15* 27* >60 >60  ANIONGAP  --  18* 15 13 4*     Hematology Recent Labs  Lab 06/28/20 0407 06/29/20 0438 06/30/20 0547  WBC 10.3 11.3* 13.1*  RBC 4.57 4.69 4.44  HGB 11.1* 11.2* 10.7*  HCT 38.1 38.9 37.9  MCV 83.4 82.9 85.4  MCH 24.3* 23.9* 24.1*  MCHC 29.1* 28.8* 28.2*  RDW 18.1* 18.6* 18.5*  PLT 170 153 145*    BNP Recent Labs  Lab 06/26/20 1435  BNP 315.4*     DDimer  Recent Labs  Lab 06/26/20 1429 06/27/20 1200  DDIMER >20.00* >20.00*     Radiology    DG Chest Port 1 View  Result Date: 06/30/2020 CLINICAL DATA:  Acute respiratory failure with hypoxemia. EXAM: PORTABLE CHEST 1 VIEW COMPARISON:  Chest x-ray 06/28/2020 FINDINGS: Right internal jugular venous catheter with tip overlying the distal superior vena cava. The heart size and mediastinal contours are unchanged. Low lung volumes . persistent patchy airspace opacity. Left pleural effusion not excluded. No pneumothorax. No acute osseous abnormality. IMPRESSION: 1. Low lung volumes with persistent patchy airspace opacity. 2. Left pleural effusion not excluded. Electronically Signed   By: Tish Frederickson M.D.   On: 06/30/2020 05:02    Cardiac Studies   Echo 06/26/20: 1. Left ventricular ejection fraction, by estimation, is 55 to 60%. The  left ventricle has normal function. The left ventricle has no regional  wall motion abnormalities. Left ventricular diastolic  parameters were  normal. There is the interventricular  septum is flattened in systole and diastole, consistent with right  ventricular pressure and volume overload.  2. McConnell's Sign noted. Right ventricular systolic function is normal  in the base. The right ventricular size is moderately enlarged.  3. The mitral valve is grossly normal. No evidence of mitral valve  regurgitation.  4. The aortic valve is tricuspid. Aortic valve regurgitation is not  visualized. No aortic stenosis is present.  5. There is mild dilatation of the ascending aorta, measuring 41 mm.   Patient Profile     50 y.o. female with no known hx of cardiac disease who was found on the bed by staff at at hotel and EMS was called due to AMS. BS was 40 and no improvement with Narcan. She had hypoxic respiratory failure with hypercarbia and cxary showed multifocal PNA with AKI and SCr of 3. LFTs were elevated and hypotensive and there was concern for cardiogenic shock. Co-ox was normal and 2D echo showed normal LVF 55-60% and no RWMAs and RV volume overload. She was volume resuscitated and is 611+ today. BNP elevated at 315 and ddimer>20. SCr elevated 3.83. LE venous dopplers are pending. Could not do Chest CTA due to AKI. Cardiology is asked to help with management possible RV failure.   Assessment & Plan    RV failure - echo with normal EF, but RV overload with McConnell's sign - obesity hypoventilation syndrome contributory and likely OSA - was not a good VQ candidate given BMI - CTA held for AKI, now recovered - scheduled for CTA today - continue heparin for now - D/C heparin if PE negative   Elevated troponin - hs troponin 120 --> 205 --> 368 --> 284 - suspect demand ischemia given hypoglycemia and respiratory failure - no ischemic evaluation this admissin - she denies chest pain   Acute respiratory failure - extubated 06/29/20 to BIPAP qHS and PRN - suspect aspiration pneumonitis in the setting of  likely OSA/OHS - rule out PE today with CTA   AKI - improved  For questions or updates, please contact [facility] Please consult [email or web address] for contact info under   Signed, Marcelino Duster, PA  06/30/2020, 10:34 AM   As above, patient seen and examined.  She is on BiPAP at the time of my evaluation.  She denies dyspnea or chest pain.  Blood pressure has improved.  Renal function also has improved and plan is for CTA today to rule out pulmonary embolus.  Otherwise RV dysfunction likely secondary to morbid obesity with obesity hypoventilation syndrome and obstructive sleep apnea.  Also with acute respiratory failure.   Elevated troponin with no clear trend and no chest pain. Electrocardiogram does show anterior T wave inversion likely secondary to RV strain.  Echocardiogram shows normal LV function with no wall motion abnormality.  Will not pursue further ischemia evaluation at this point.  Cardiology will sign off.  Please call with questions.  She does not require additional cardiology follow-up as an outpt. Olga Millers, MD

## 2020-06-30 NOTE — Progress Notes (Signed)
ANTICOAGULATION CONSULT NOTE  Pharmacy Consult for IV heparin Indication: presumed PE  No Active Allergies  Patient Measurements: Height: 5\' 6"  (167.6 cm) Weight: (!) 244.5 kg (539 lb) IBW/kg (Calculated) : 59.3 Heparin Dosing Weight: 117 kg  Vital Signs: Temp: 98.4 F (36.9 C) (05/09 0400) Temp Source: Axillary (05/09 0400) BP: 121/45 (05/09 0600) Pulse Rate: 78 (05/09 0600)  Labs: Recent Labs     0000 06/27/20 1606 06/28/20 0021 06/28/20 0406 06/28/20 0407 06/28/20 0813 06/28/20 1828 06/29/20 0438 06/30/20 0547  HGB   < >  --   --   --  11.1*  --   --  11.2* 10.7*  HCT  --   --   --   --  38.1  --   --  38.9 37.9  PLT  --   --   --   --  170  --   --  153 145*  LABPROT  --   --   --   --  16.9*  --   --   --   --   INR  --   --   --   --  1.4*  --   --   --   --   HEPARINUNFRC  --   --    < >  --   --    < > 0.45 0.40 0.54  CREATININE  --   --   --   --  2.16*  --   --  1.05* 0.56  CKTOTAL  --  153  --   --   --   --   --   --   --   CKMB  --  4.9  --   --   --   --   --   --   --   TROPONINIHS  --  368*  --  284*  --   --   --   --   --    < > = values in this interval not displayed.    Estimated Creatinine Clearance: 175.2 mL/min (by C-G formula based on SCr of 0.56 mg/dL).   Medical History: No past medical history on file.  Medications:  No medications prior to admission.   Scheduled:  . Chlorhexidine Gluconate Cloth  6 each Topical Daily  . mouth rinse  15 mL Mouth Rinse BID  . multivitamin  15 mL Per Tube Daily  . pantoprazole (PROTONIX) IV  40 mg Intravenous QHS    Assessment: 64 yoM with unknown PMH admitted 5/4 with AMS d/t hypoglycemia/PNA; now with elevated D-dimer.   Baseline INR mildly elevated (possibly d/t hepatic congestion + shock liver) but decreased to < 2.0; aPTT WNL  Prior anticoagulation: unknown, however all anticoag markers are now subtherapeutic  06/30/2020  Heparin level continues to be therapeutic on current IV heparin  of rate to 1700 units/hr  CBC - Hgb 10.7 stable and Plts 145 trending down slightly  No bleeding or infusion issues noted  Of note, unable to do CT or VQ to officially "rule out" PE; dopplers neg for DVT but parts of exam limited  HIT Ab negative 5/5  Goal of Therapy: Heparin level 0.3-0.7 units/ml Monitor platelets by anticoagulation protocol: Yes  Plan:  Continue heparin at 1700 units/hr  Daily CBC and heparin level  Monitor for signs of bleeding or thrombosis  08/30/2020, PharmD, BCPS Pharmacy: [phone] 06/30/2020 7:23 AM

## 2020-07-01 ENCOUNTER — Encounter (HOSPITAL_COMMUNITY): Payer: Self-pay | Admitting: Internal Medicine

## 2020-07-01 ENCOUNTER — Other Ambulatory Visit: Payer: Self-pay

## 2020-07-01 LAB — COMPREHENSIVE METABOLIC PANEL
ALT: 209 U/L — ABNORMAL HIGH (ref 0–44)
AST: 64 U/L — ABNORMAL HIGH (ref 15–41)
Albumin: 2.7 g/dL — ABNORMAL LOW (ref 3.5–5.0)
Alkaline Phosphatase: 55 U/L (ref 38–126)
Anion gap: 9 (ref 5–15)
BUN: 19 mg/dL (ref 6–20)
CO2: 27 mmol/L (ref 22–32)
Calcium: 8.4 mg/dL — ABNORMAL LOW (ref 8.9–10.3)
Chloride: 106 mmol/L (ref 98–111)
Creatinine, Ser: 0.71 mg/dL (ref 0.44–1.00)
GFR, Estimated: >60 mL/min (ref >60)
Glucose, Bld: 108 mg/dL — ABNORMAL HIGH (ref 70–99)
Potassium: 3.9 mmol/L (ref 3.5–5.1)
Sodium: 142 mmol/L (ref 135–145)
Total Bilirubin: 0.9 mg/dL (ref 0.3–1.2)
Total Protein: 6.3 g/dL — ABNORMAL LOW (ref 6.5–8.1)

## 2020-07-01 LAB — MAGNESIUM: Magnesium: 1.7 mg/dL (ref 1.7–2.4)

## 2020-07-01 LAB — CBC
HCT: 37.6 % (ref 36.0–46.0)
Hemoglobin: 10.4 g/dL — ABNORMAL LOW (ref 12.0–15.0)
MCH: 24.1 pg — ABNORMAL LOW (ref 26.0–34.0)
MCHC: 27.7 g/dL — ABNORMAL LOW (ref 30.0–36.0)
MCV: 87 fL (ref 80.0–100.0)
Platelets: 159 10*3/uL (ref 150–400)
RBC: 4.32 MIL/uL (ref 3.87–5.11)
RDW: 18.2 % — ABNORMAL HIGH (ref 11.5–15.5)
WBC: 14.4 10*3/uL — ABNORMAL HIGH (ref 4.0–10.5)
nRBC: 0 % (ref 0.0–0.2)

## 2020-07-01 LAB — GLUCOSE, CAPILLARY
Glucose-Capillary: 76 mg/dL (ref 70–99)
Glucose-Capillary: 78 mg/dL (ref 70–99)
Glucose-Capillary: 127 mg/dL — ABNORMAL HIGH (ref 70–99)
Glucose-Capillary: 94 mg/dL (ref 70–99)
Glucose-Capillary: 123 mg/dL — ABNORMAL HIGH (ref 70–99)

## 2020-07-01 LAB — CULTURE, BLOOD (ROUTINE X 2)
Culture: NO GROWTH
Special Requests: ADEQUATE

## 2020-07-01 LAB — HEPARIN LEVEL (UNFRACTIONATED): Heparin Unfractionated: 0.15 IU/mL — ABNORMAL LOW (ref 0.30–0.70)

## 2020-07-01 MED ORDER — RIVAROXABAN 15 MG PO TABS
15.0000 mg | ORAL_TABLET | Freq: Two times a day (BID) | ORAL | Status: AC
  Administered 2020-07-01 – 2020-07-21 (×41): 15 mg via ORAL
  Filled 2020-07-01 (×43): qty 1

## 2020-07-01 MED ORDER — HEPARIN (PORCINE) 25000 UT/250ML-% IV SOLN: 2000.0000 [IU]/h | INTRAVENOUS | Status: AC

## 2020-07-01 MED ORDER — RIVAROXABAN 20 MG PO TABS
20.0000 mg | ORAL_TABLET | Freq: Every day | ORAL | Status: DC
  Administered 2020-07-22 – 2020-07-24 (×3): 20 mg via ORAL
  Filled 2020-07-01 (×3): qty 1

## 2020-07-01 MED FILL — Fentanyl Citrate Preservative Free (PF) Inj 100 MCG/2ML: INTRAMUSCULAR | Qty: 2 | Status: AC

## 2020-07-01 NOTE — Progress Notes (Signed)
ANTICOAGULATION CONSULT NOTE  Pharmacy Consult for IV heparin Indication: presumed PE  No Active Allergies  Patient Measurements: Height: 5\' 6"  (167.6 cm) Weight: (!) 248.6 kg (548 lb) IBW/kg (Calculated) : 59.3 Heparin Dosing Weight: 117 kg  Vital Signs: Temp: 99.2 F (37.3 C) (05/10 0801) Temp Source: Oral (05/10 0801) BP: 110/63 (05/10 0900) Pulse Rate: 88 (05/10 0945)  Labs: Recent Labs    06/29/20 0438 06/30/20 0547 06/30/20 2023 07/01/20 1010  HGB 11.2* 10.7*  --  10.4*  HCT 38.9 37.9  --  37.6  PLT 153 145*  --  159  HEPARINUNFRC 0.40 0.54  --  0.15*  CREATININE 1.05* 0.56 0.74 0.71    Estimated Creatinine Clearance: 177.3 mL/min (by C-G formula based on SCr of 0.71 mg/dL).   Medical History: No past medical history on file.  Medications:  No medications prior to admission.   Scheduled:  . Chlorhexidine Gluconate Cloth  6 each Topical Daily  . mouth rinse  15 mL Mouth Rinse BID  . multivitamin with minerals  1 tablet Oral Daily  . pantoprazole (PROTONIX) IV  40 mg Intravenous QHS    Assessment: Felicia Warren with unknown PMH admitted 5/4 with AMS d/t hypoglycemia/PNA; now with elevated D-dimer.   Baseline INR mildly elevated (possibly d/t hepatic congestion + shock liver) but decreased to < 2.0; aPTT WNL  Prior anticoagulation: unknown, however all anticoag markers are now subtherapeutic  07/01/2020  Heparin levels subtherapeutic on current IV heparin of rate to 1700 units/hr - suspicious since no interruptions to infusion and levels have been therapeutic for the past 48hr on the same rate  CBC - Hgb 10.4 stable and Plts 159 improving (HIT Ab negative 5/5)  No bleeding or infusion issues noted  Of note, unable to do CT or VQ to officially "rule out" PE; CTa 5/9 shows probable PE in LLL segment but difficult to discern  Dopplers neg for DVT but parts of exam limited  Goal of Therapy: Heparin level 0.3-0.7 units/ml Monitor platelets by  anticoagulation protocol: Yes  Plan:  Increase heparin to 2000 units/hr  Recheck heparin level in 6hrs  Daily CBC and heparin level  Monitor for signs of bleeding or thrombosis  Follow up transition to oral [facility]  [facility], PharmD, BCPS Pharmacy: [phone] 07/01/2020 10:Felicia AM

## 2020-07-01 NOTE — Progress Notes (Signed)
Spoke with TOC team regarding possibilities for positive pressure ventilation at night. The patient is in a difficult situation as she is not a resident of [address] and does not have insurance.  She intends to return back to [place] for her things and would like to move back to  to be near her family.  TOC will look for free clinics in [place] and provide the patient information. She can apply for Medicaid there and transfer it here if she does move here.        Canary Brim, MSN, APRN, NP-C, AGACNP-BC [facility] 07/01/2020, 8:22 AM   Please see [email or web address] for pager details.   From 7A-7P if no response, please call [phone] After hours, please call ELink [phone]

## 2020-07-01 NOTE — Progress Notes (Addendum)
NAME:  Felicia Warren, MRN:  [id number], DOB:  04-24-1970, LOS: 6 ADMISSION DATE:  06/25/2020, CONSULTATION DATE:  07/01/20 REFERRING MD:  EDP, CHIEF COMPLAINT:  AMS   History of Present Illness:  50 y/o F who presented initially as a Felicia Warren, found with altered mental status in a motel by staff.  It is unknown when her last known well was.  EMS found patient awake, but altered with a glucose of 40.  This was corrected, but she continued to have worsening mental status.  Narcan did not provide any response.  In ED she required nonrebreather mask and eventual intubation for hypoxic respiratory failure.  Chest x-ray with probable multifocal pneumonia.  AKI, elevated INR on admit.  No prior history in epic and no known emergency contact. Work up consistent with RV overload, suspected OHS / OSA, LLL segmental PE on heparin.  AKI resolved.   Pertinent  Medical History  Obesity ?  OSA  Significant Hospital Events: Including procedures, antibiotic start and stop dates in addition to other pertinent events    5/4 presented to ED with hypoglycemia and AMS, required intubation, PCCM consult. CXR concerning for PNA.  Unasyn initiated. Tx to [facility] for beds. UDS neg, neg tylenol/ salicylates. CTH neg. Lactic acid 5.9-> 4.9 . 5/5 Hypotensive, responded to IVF + albumin, on heparin gtt but stopped due to platelet decline, not on pressors, requiring PEEP 10 / FiO2 80. RUQ [place] with cholelithiasis but no cholecystitis.   Marland Kitchen 5/6 Vent needs decreased. LE venous duplex negative.  . 5/7 diarrhea > Placed flexiseal 5/8  5/8 WUA/SBT, extubated  5/9 CTA Chest with LLL segmental PE with associated airspace disease and pulmonary infarct, dilation of the main pulmonary artery up to 3.2cm.  To TRH.   Interim History / Subjective:  Afebrile Pt wore BiPAP overnight > now on 4L  Repeat cultures pending with no growth Pt reports she is planning to move to [place] but all her things remain in [place] at this point  Objective    Blood pressure (!) 95/46, pulse 83, temperature 98.9 F (37.2 C), temperature source Axillary, resp. rate (!) 21, height 5\' 6"  (1.676 m), weight (!) 248.6 kg, SpO2 94 %.    Vent Mode: BIPAP FiO2 (%):  [30 %-50 %] 50 % Set Rate:  [12 bmp] 12 bmp PEEP:  [5 cmH20-12 cmH20] 12 cmH20 Pressure Support:  [12 cmH20] 12 cmH20 Plateau Pressure:  [20 cmH20] 20 cmH20   Intake/Output Summary (Last 24 hours) at 07/01/2020 0726 Last data filed at 07/01/2020 0535 Gross per 24 hour  Intake 1743.16 ml  Output 950 ml  Net 793.16 ml   Filed Weights   06/29/20 0440 06/30/20 0500 07/01/20 0500  Weight: (!) 244.9 kg (!) 244.5 kg (!) 248.6 kg   Exam: General: morbidly obese female lying in bed in NAD HEENT: MM pink/moist, [place] O2, pupils =/reactive  Neuro: AAOx4, speech clear, MAE  CV: s1s2 RRR, no m/r/g PULM: non-labored on 4L, clear upper anterior, unable to assess lower lung fields due to body habitus GI: soft, bsx4 active  Extremities: warm/dry, no appreciable edema / difficult to assess with body habitus   Skin: no rashes or lesions  Labs/imaging that I havepersonally reviewed  (right click and "Reselect all SmartList Selections" daily)  CTA chest from 5/9  BMP - sr cr 0.74  CBC - WBC 13.1, Hgb 10.7, platelets 145  O2 needs  BiPAP use   Resolved Hospital Problem list   Hypotension / Shock  Thrombocytopenia AKI  Assessment & Plan:   Acute Metabolic Encephalopathy secondary to Hypoglycemia & Hypercapnic Respiratory Failure Unknown past medical history, but would suspect some element of obesity hypoventilation syndrome or OSA given hypercarbia and elevated bicarbonate. CTH, UDS, ammonia negative.  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 BC 1 of 2 from 5/5 growing muliple species (fem  site), doubt active infection.  -mandatory BiPAP QHS & PRN daytime sleep  -will discuss home machine options with care management > she lives in [place]  -rocephin D7/7 -will need outpatient sleep study  -pulmonary hygiene  -continue heparin gtt, will need 3-6 months anticoagulation   AKI, undetermined acuity AGMA/ lactic acidosis  Hypokalemia  -resolved, per primary  -follow trend post contrast media 5/9   Elevated Troponin  McConnell's Sign  D-Dimer >20 on admit. Suspect in setting of demand, trop hs 120-205. ECHO with LVEF 55-60%, no WMA, McConnell's sign with moderately enlarged RV, interventricular septal flattening concerning for RV overload, ? If this is largely related to sequela of OHS with hypercarbia / elevated serum bicarbonate and negative LE dopplers. She does have a small LLL segmental PE (difficult to actually discern due to body habitus) with possible pulmonary infarct.  -continue heparin gtt as above  -would transition to oral anticoagulation once reliably awake to take PO's   Elevated liver enzymes DDx includes downtime from hypoglycemia / shock, CHF. RUQ [place] negative. Acute hepatitis panel negative. Suspect from shock liver -per primary    Hypoglycemia  Glucose in 40's on admit, no available hx -per primary   Diarrhea  Likely in setting of TF + bowel regimen  -per primary, improving   Anemia  -trend CBC -per primary   Obesity, BMI 71  -reviewed weight loss strategies with patient 5/10    Best practice (right click and "Reselect all SmartList Selections" daily)  Diet:  Oral   Pain/Anxiety/Delirium protocol (if indicated): No VAP protocol (if indicated): Not indicated DVT prophylaxis: Systemic AC GI prophylaxis: PPI Glucose control:  SSI Yes Central venous access:  Yes, and it is still needed Arterial line:  N/A Foley:  N/A Mobility:  bed rest  PT consulted: N/A Last date of multidisciplinary goals of care discussion: patient updated on plan of  care 5/10  Code Status:  full code Disposition: SDU, TRH primary as of 5/10     Time spent with patient - 20 minutes   Felicia Brim, MSN, APRN, NP-C, AGACNP-BC [facility] 07/01/2020, 7:26 AM   Please see [email or web address] for pager details.   From 7A-7P if no response, please call [phone] After hours, please call ELink [phone]

## 2020-07-01 NOTE — Evaluation (Signed)
Physical Therapy Evaluation Patient Details Name: Felicia Warren MRN: [id number] DOB: 07/31/1970 Today's Date: 07/01/2020   History of Present Illness  Patient is a 50 year old female admitted with hypoglycemia and AMS, needing intubation on 5/4 and extubated on 5/8. Unsure of past medical history, patient currently living in [place] plans to move to [place] to be closer to family.  Clinical Impression  Evaluation limited as patient is on a bariatric air bed which is difficult to sit safely at bedsids and maxisky lift equipment is not charged.  Patient  Vague about current living status, reports that she is lives in [place], family lives here.   Patient requiring +2 max assistance for rolling, patient did pull self to sit upright briefly in long sitting position.  Patient  Should progress to return to ambulation once able to get out of bed and into a recliner.  Pt admitted with above diagnosis.  Pt currently with functional limitations due to the deficits listed below (see PT Problem List). Pt will benefit from skilled PT to increase their independence and safety with mobility to allow discharge to the venue listed below.    Patient does demonstrate fatigue with minimal efforts of mobility.     Follow Up Recommendations SNF (dep. on progress)    Equipment Recommendations   (TBD)    Recommendations for Other Services       Precautions / Restrictions Precautions Precautions: Fall Precaution Comments: bariatric Restrictions Weight Bearing Restrictions: No      Mobility  Bed Mobility Overal bed mobility: Needs Assistance Bed Mobility: Rolling Rolling: Max assist;+2 for physical assistance;+2 for safety/equipment         General bed mobility comments: provide verbal and tactile cuing for patient to assist bending knee and reach with UE for bed rail. needing use of bed pad and significant assist from therapy to roll from hip level down to come to sidelying position. When  seated more upright in semi-chair position patient able to pull with B UEs to sit forward in bed for ~10 seconds before fatigued.    Transfers                 General transfer comment: unsafe at this time patient on air bed having difficulty with rolling, maxi sky unavailable (not charged)  Ambulation/Gait                Careers information officer    Modified Rankin (Stroke Patients Only)       Balance                                             Pertinent Vitals/Pain Pain Assessment: Faces Faces Pain Scale: No hurt    Home Living Family/patient expects to be discharged to:: Private residence Living Arrangements: Alone Available Help at Discharge: Family Type of Home: Apartment Home Access: Stairs to enter     Home Layout: One level Home Equipment: None Additional Comments: unsure of accuracy, per chart review patient found in a motel. Patient is alert/oriented x3 however needing increased time for word finding and following directions at times    Prior Function Level of Independence: Independent               Hand Dominance   Dominant Hand: Right    Extremity/Trunk Assessment  Upper Extremity Assessment Upper Extremity Assessment: Overall WFL for tasks assessed    Lower Extremity Assessment Lower Extremity Assessment: Generalized weakness    Cervical / Trunk Assessment Cervical / Trunk Assessment: Other exceptions Cervical / Trunk Exceptions: body habitus  Communication   Communication: No difficulties  Cognition Arousal/Alertness: Awake/alert Behavior During Therapy: WFL for tasks assessed/performed Overall Cognitive Status: No family/caregiver present to determine baseline cognitive functioning                                 General Comments: patient knows where she is, date, that she came to hospital saturday however at times keeping her eyes closed and needing increased time for  word finding/ following cues      General Comments General comments (skin integrity, edema, etc.): bed placed in semi chair position, able to pull self forward in bed x 1 and hold for a few seconds    Exercises Other Exercises Other Exercises: provide patient with green theraband and demonstrate UE exercises to perform at bed level   Assessment/Plan    PT Assessment Patient needs continued PT services  PT Problem List Decreased strength;Decreased mobility;Decreased safety awareness;Decreased knowledge of precautions;Obesity;Decreased activity tolerance;Cardiopulmonary status limiting activity;Decreased balance;Decreased knowledge of use of DME       PT Treatment Interventions DME instruction;Therapeutic activities;Gait training;Therapeutic exercise;Patient/family education;Functional mobility training    PT Goals (Current goals can be found in the Care Plan section)  Acute Rehab PT Goals Patient Stated Goal: sit up PT Goal Formulation: With patient Time For Goal Achievement: 07/15/20 Potential to Achieve Goals: Fair    Frequency Min 2X/week   Barriers to discharge Decreased caregiver support      Co-evaluation PT/OT/SLP Co-Evaluation/Treatment: Yes Reason for Co-Treatment: For patient/therapist safety PT goals addressed during session: Mobility/safety with mobility OT goals addressed during session: ADL's and self-care       AM-PAC PT "6 Clicks" Mobility  Outcome Measure Help needed turning from your back to your side while in a flat bed without using bedrails?: Total Help needed moving from lying on your back to sitting on the side of a flat bed without using bedrails?: Total Help needed moving to and from a bed to a chair (including a wheelchair)?: Total Help needed standing up from a chair using your arms (e.g., wheelchair or bedside chair)?: Total Help needed to walk in hospital room?: Total Help needed climbing 3-5 steps with a railing? : Total 6 Click Score:  6    End of Session Equipment Utilized During Treatment: Oxygen Activity Tolerance: Patient limited by fatigue Patient left: in bed;with call bell/phone within reach Nurse Communication: Mobility status;Need for lift equipment PT Visit Diagnosis: Muscle weakness (generalized) (M62.81);Difficulty in walking, not elsewhere classified (R26.2)    Time: 8676-1950 PT Time Calculation (min) (ACUTE ONLY): 26 min   Charges:   PT Evaluation $PT Eval Low Complexity: 1 Low          [name] PT Acute Rehabilitation Services Pager [phone] Office [phone]   [name] 07/01/2020, 3:22 PM

## 2020-07-01 NOTE — TOC Progression Note (Signed)
Transition of Care [facility]) - Progression Note    Patient Details  Name: Felicia Warren MRN: [id number] Date of Birth: [birth year]  [facility]) CM/SW Contact  Golda Acre, RN Phone Number: 07/01/2020, 1:16 PM  Clinical Narrative:    Patient lioves in [place] MD two free clinic are near her home .[facility] and [facility], addresses and telephone numbers given to patient.    Expected Discharge Plan: Homeless Shelter Barriers to Discharge: Continued Medical Work up  Expected Discharge Plan and Services Expected Discharge Plan: Homeless Shelter       Living arrangements for the past 2 months: Hotel/Motel                                       Social Determinants of Health (SDOH) Interventions    Readmission Risk Interventions No flowsheet data found.

## 2020-07-01 NOTE — Progress Notes (Signed)
PROGRESS NOTE    Felicia Warren  ZOX:[id number] DOB: [birth year] DOA: 06/25/2020 PCP: No primary care provider on file.   Brief Narrative:  50 yo F who presented initially as a Felicia Warren, found with altered mental status in a motel by staff.  It is unknown when her last known well was.  EMS found patient awake, but altered with a glucose of 40.  This was corrected, but she continued to have worsening mental status.  Narcan did not provide any response.  In ED she required nonrebreather mask and eventual intubation for hypoxic respiratory failure.  Chest x-ray with probable multifocal pneumonia.  AKI, elevated INR on admit. No prior history in epic as she is from [place] and no known emergency contact. Admitted by PCCM given requirement for airway protection/intubation.   5/4 presented to ED with hypoglycemia and AMS, required intubation, PCCM consult. CXR concerning for PNA.  Unasyn initiated. Tx to [facility] for beds. UDS neg, neg tylenol/ salicylates. CTH neg. Lactic acid 5.9-> 4.9  5/5 Hypotensive, responded to IVF + albumin, on heparin gtt but stopped due to platelet decline, not on pressors, requiring PEEP 10 / FiO2 80. RUQ [place] with cholelithiasis but no cholecystitis.    5/6 Vent needs decreased. LE venous duplex negative.   5/7 diarrhea > Placed flexiseal 5/8  5/8 WUA/SBT, extubated    Assessment & Plan:   Active Problems:   Hypercapnic respiratory failure (HCC)   Acute renal failure (HCC)   Acute respiratory failure with hypoxia and hypercarbia (HCC)   Elevated LFTs   Elevated troponin   Acute Metabolic Encephalopathy secondary to Hypoglycemia & Hypercapnic Respiratory Failure Super(Morbid) Obesity - BMI 88 - Obesity hypoventilation syndrome extremely likely given hypercarbia and elevated bicarbonate despite respiratory recovery.  - CT head, UDS, and ammonia unremarkable. - Mental status resolving with supportive care - now off ventilator >48h  - Minimize sedating medications  -  Nocturnal BiPAP in the interim per PCCM while she continues to recover  Acute Hypercarbic / Hypoxemic Respiratory Failure requiring IPPV, POA Suspected initially secondary to CAP/Aspiration with concurrent OHS (BMI 88), POA - Completed abx course 06/30/20 - Strep/legionella urinay antigens negative.  - Blood cultures likely contaminant given only 1 of 2 from May 4th growing muliple species  - Repeat cultures 5/5 remain negative - Will need outpatient sleep study > she lives in [place] will likely need referral from PCP  Suspected LLL segmental PE, Likely POA D Dimer elevation - Greater than 20 on 5/6 - LLL segmental PE suspected on imaging - Transition to xarelto tonight - discussed with pharmacy  AKI, undetermined acuity/unkown baseline, resolved AGMA/lactic acidosis, resolved Hypokalemia, resolved - Trend BMP / urinary output - Avoid nephrotoxic agents, ensure adequate renal perfusion  Elevated Troponin\Likely supply demand mismatch in the setting of profound hypoxia Elevated D Dimer  McConnell's Sign  - Troponin downtrending with oxygenation - Echo = LVEF 55-60%, no WMA, McConnell's sign with moderately enlarged RV, interventricular septal flattening concerning for RV overload (likely due to OHS as above) - Suspected LLL segmental PE as above - on xarelto  Elevated liver enzymes - Cannot rule out shock liver given profound hypoxia - RUQ [place] negative. Acute hepatitis panel negative. - Labs downtrending appropriately    Hypoglycemia  - Glucose in 40's on admit, no available hx - Continue D5w, reduce rate to 50 ml/hr - A1C 6.3 - likely baseline pre-DM given body habitus and lifestyle/diet   Diarrhea, resolving - Likely in setting of TF + bowel  regimen  -PRN colace, miralax   Anemia, normocytic - likely chronic anemia of chronic disease given above  -trend CBC  -transfuse for Hgb <7%  DVT prophylaxis: Xarelto Code Status: Full Family Communication: None  available  Status is: Inpt  Dispo: The patient is from: Home              Anticipated d/c is to: TBD              Anticipated d/c date is: >72h              Patient currently NOT medically stable for discharge  Consultants:   PCCM  Antibiotics None currently  Subjective: No acute issues/events overnight - denies chest pain, shortness of breath, headache, fevers, or chills.  Objective: Vitals:   07/01/20 0300 07/01/20 0316 07/01/20 0400 07/01/20 0500  BP: 105/63  (!) 95/46   Pulse:   83   Resp: 18  (!) 21   Temp:  98.9 F (37.2 C)    TempSrc:  Axillary    SpO2:   94%   Weight:    (!) 248.6 kg  Height:        Intake/Output Summary (Last 24 hours) at 07/01/2020 0741 Last data filed at 07/01/2020 0535 Gross per 24 hour  Intake 1743.16 ml  Output 950 ml  Net 793.16 ml   Filed Weights   06/29/20 0440 06/30/20 0500 07/01/20 0500  Weight: (!) 244.9 kg (!) 244.5 kg (!) 248.6 kg    Examination:  General exam: Morbidly obese - resting comfortably in bed Respiratory system: Profoundly diminished bilaterally Cardiovascular system: S1 & S2 heard, RRR. No JVD, murmurs, rubs, gallops or clicks. No pedal edema. Gastrointestinal system: Abdomen is nondistended,obese, soft and nontender. Central nervous system: Alert and oriented. No focal neurological deficits. Extremities: Symmetric 5 x 5 power. Skin: No rashes, lesions  Data Reviewed: I have personally reviewed following labs and imaging studies  CBC: Recent Labs  Lab 06/25/20 1715 06/25/20 2043 06/26/20 0320 06/26/20 1429 06/27/20 0444 06/28/20 0407 06/29/20 0438 06/30/20 0547  WBC 15.6*  --  14.3*  --  9.8 10.3 11.3* 13.1*  NEUTROABS 13.2*  --  12.4*  --  8.5*  --   --   --   HGB 12.0   < > 11.5*  --  11.5* 11.1* 11.2* 10.7*  HCT 44.1   < > 41.5  --  38.8 38.1 38.9 37.9  MCV 90.2  --  86.3  --  82.6 83.4 82.9 85.4  PLT 320  --  125* 174 173 170 153 145*   < > = values in this interval not displayed.   Basic  Metabolic Panel: Recent Labs  Lab 06/26/20 0320 06/26/20 1429 06/26/20 1700 06/27/20 0444 06/28/20 0407 06/29/20 0438 06/30/20 0547 06/30/20 2023  NA 143  --   --  143 143 144 148* 146*  K 4.4  --   --  3.1* 2.9* 2.9* 2.9* 4.2  CL 99  --   --  99 100 104 109 106  CO2 26  --   --  26 28 27  35* 32  GLUCOSE 103*  --   --  125* 120* 116* 96 83  BUN 46*  --   --  61* 56* 42* 29* 25*  CREATININE 3.83*  --   --  3.57* 2.16* 1.05* 0.56 0.74  CALCIUM 7.4*  --   --  7.7* 7.5* 7.8* 7.8* 8.4*  MG 2.0 2.0 2.0 1.9 1.8  --   --  1.9  PHOS 6.4* 4.6 4.5 4.0  --   --   --   --    GFR: Estimated Creatinine Clearance: 177.3 mL/min (by C-G formula based on SCr of 0.74 mg/dL). Liver Function Tests: Recent Labs  Lab 06/25/20 1515 06/26/20 0959 06/27/20 0444 06/28/20 0407  AST 762* 2,160* 1,003* 382*  ALT 346* 912* 757* 574*  ALKPHOS 110 88 86 71  BILITOT 4.5* 2.6* 1.7* 1.2  PROT 7.9 7.0 6.6 6.4*  ALBUMIN 3.6 3.3* 3.4* 2.9*   Recent Labs  Lab 06/26/20 0959 06/27/20 1606  LIPASE 23 64*  AMYLASE  --  75   Recent Labs  Lab 06/26/20 0320 06/27/20 0444  AMMONIA 38* 11   Coagulation Profile: Recent Labs  Lab 06/25/20 1715 06/26/20 1429 06/28/20 0407  INR 2.1* 1.8*  1.9* 1.4*   Cardiac Enzymes: Recent Labs  Lab 06/25/20 2156 06/27/20 1606  CKTOTAL 124 153  CKMB  --  4.9   BNP (last 3 results) No results for input(s): PROBNP in the last 8760 hours. HbA1C: No results for input(s): HGBA1C in the last 72 hours. CBG: Recent Labs  Lab 06/30/20 1159 06/30/20 1529 06/30/20 1934 06/30/20 2355 07/01/20 0314  GLUCAP 76 71 78 88 76   Lipid Profile: Recent Labs    06/29/20 0438  TRIG 73   Thyroid Function Tests: No results for input(s): TSH, T4TOTAL, FREET4, T3FREE, THYROIDAB in the last 72 hours. Anemia Panel: No results for input(s): VITAMINB12, FOLATE, FERRITIN, TIBC, IRON, RETICCTPCT in the last 72 hours. Sepsis Labs: Recent Labs  Lab 06/25/20 1720  06/25/20 2243 06/27/20 1606 06/28/20 0407 06/29/20 0438  PROCALCITON  --   --  0.38 0.32 0.26  LATICACIDVEN 5.9* 4.9* 1.7 1.3  --     Recent Results (from the past 240 hour(s))  Resp Panel by RT-PCR (Flu A&B, Covid) Nasopharyngeal Swab     Status: None   Collection Time: 06/25/20  3:15 PM   Specimen: Nasopharyngeal Swab; Nasopharyngeal(NP) swabs in vial transport medium  Result Value Ref Range Status   SARS Coronavirus 2 by RT PCR NEGATIVE NEGATIVE Final    Comment: (NOTE) SARS-CoV-2 target nucleic acids are NOT DETECTED.  The SARS-CoV-2 RNA is generally detectable in upper respiratory specimens during the acute phase of infection. The lowest concentration of SARS-CoV-2 viral copies this assay can detect is 138 copies/mL. A negative result does not preclude SARS-Cov-2 infection and should not be used as the sole basis for treatment or other patient management decisions. A negative result may occur with  improper specimen collection/handling, submission of specimen other than nasopharyngeal swab, presence of viral mutation(s) within the areas targeted by this assay, and inadequate number of viral copies(<138 copies/mL). A negative result must be combined with clinical observations, patient history, and epidemiological information. The expected result is Negative.  Fact Sheet for Patients:  BloggerCourse.com  Fact Sheet for Healthcare Providers:  SeriousBroker.it  This test is no t yet approved or cleared by the [place] FDA and  has been authorized for detection and/or diagnosis of SARS-CoV-2 by FDA under an Emergency Use Authorization (EUA). This EUA will remain  in effect (meaning this test can be used) for the duration of the COVID-19 declaration under Section 564(b)(1) of the Act, 21 U.S.C.section 360bbb-3(b)(1), unless the authorization is terminated  or revoked sooner.       Influenza A by PCR NEGATIVE NEGATIVE  Final   Influenza B by PCR NEGATIVE NEGATIVE Final    Comment: (NOTE) The Xpert  Xpress SARS-CoV-2/FLU/RSV plus assay is intended as an aid in the diagnosis of influenza from Nasopharyngeal swab specimens and should not be used as a sole basis for treatment. Nasal washings and aspirates are unacceptable for Xpert Xpress SARS-CoV-2/FLU/RSV testing.  Fact Sheet for Patients: BloggerCourse.com  Fact Sheet for Healthcare Providers: SeriousBroker.it  This test is not yet approved or cleared by the [place] FDA and has been authorized for detection and/or diagnosis of SARS-CoV-2 by FDA under an Emergency Use Authorization (EUA). This EUA will remain in effect (meaning this test can be used) for the duration of the COVID-19 declaration under Section 564(b)(1) of the Act, 21 U.S.C. section 360bbb-3(b)(1), unless the authorization is terminated or revoked.  Performed at [facility], [address] [place], [place] [address]   Culture, blood (routine x 2)     Status: Abnormal   Collection Time: 06/25/20  4:45 PM   Specimen: BLOOD  Result Value Ref Range Status   Specimen Description BLOOD FEMORAL ARTERY  Final   Special Requests   Final    BOTTLES DRAWN AEROBIC AND ANAEROBIC Blood Culture results may not be optimal due to an excessive volume of blood received in culture bottles   Culture  Setup Time   Final    GRAM POSITIVE RODS ANAEROBIC BOTTLE ONLY CRITICAL RESULT CALLED TO, READ BACK BY AND VERIFIED WITH: A. PHAM PHARMD, AT 1914 06/26/20 D. Leighton Roach    Culture (A)  Final    ENTEROCOCCUS FAECIUM STAPHYLOCOCCUS EPIDERMIDIS STAPHYLOCOCCUS SIMULANS THE SIGNIFICANCE OF ISOLATING THIS ORGANISM FROM A SINGLE SET OF BLOOD CULTURES WHEN MULTIPLE SETS ARE DRAWN IS UNCERTAIN. PLEASE NOTIFY THE MICROBIOLOGY DEPARTMENT WITHIN ONE WEEK IF SPECIATION AND SENSITIVITIES ARE REQUIRED. CLOSTRIDIUM PERFRINGENS Standardized susceptibility  testing for this organism is not available. Performed at [facility], [address], [place], [place] [address]    Report Status 06/29/2020 FINAL  Final   Organism ID, Bacteria ENTEROCOCCUS FAECIUM  Final      Susceptibility   Enterococcus faecium - MIC*    AMPICILLIN <=2 SENSITIVE Sensitive     VANCOMYCIN <=0.5 SENSITIVE Sensitive     GENTAMICIN SYNERGY SENSITIVE Sensitive     * ENTEROCOCCUS FAECIUM  MRSA PCR Screening     Status: None   Collection Time: 06/25/20 10:00 PM   Specimen: Nasal Mucosa; Nasopharyngeal  Result Value Ref Range Status   MRSA by PCR NEGATIVE NEGATIVE Final    Comment:        The GeneXpert MRSA Assay (FDA approved for NASAL specimens only), is one component of a comprehensive MRSA colonization surveillance program. It is not intended to diagnose MRSA infection nor to guide or monitor treatment for MRSA infections. Performed at [facility], [address] [place], [place] [address]   Culture, blood (routine x 2)     Status: None (Preliminary result)   Collection Time: 06/26/20  3:20 AM   Specimen: BLOOD  Result Value Ref Range Status   Specimen Description   Final    BLOOD RIGHT ANTECUBITAL Performed at [facility], [address], [place], [place] [address]    Special Requests   Final    BOTTLES DRAWN AEROBIC ONLY Blood Culture adequate volume Performed at [facility], [address] [place], [place] [address]    Culture   Final    NO GROWTH 4 DAYS Performed at [facility], [address] [place], [place] [address]    Report Status  PENDING  Incomplete  Culture, Respiratory w Gram Stain     Status: None   Collection Time: 06/26/20  8:19 AM   Specimen: Tracheal Aspirate; Respiratory  Result Value Ref Range Status   Specimen Description   Final    TRACHEAL ASPIRATE Performed at [facility], [address], [place], [place] [address]    Special  Requests   Final    NONE Performed at [facility], [address], [place], [place] [address]    Gram Stain   Final    ABUNDANT WBC PRESENT,BOTH PMN AND MONONUCLEAR NO ORGANISMS SEEN    Culture   Final    Normal respiratory flora-no Staph aureus or Pseudomonas seen Performed at [facility], [address] [place], [place] [address]    Report Status 06/28/2020 FINAL  Final         Radiology Studies: CT ANGIO CHEST PE W OR WO CONTRAST  Addendum Date: 06/30/2020   ADDENDUM REPORT: 06/30/2020 15:17 ADDENDUM: In addition to findings outlined in the previous report there is dilation of the main pulmonary artery up to 3.2 cm which may indicate pulmonary arterial hypertension. Critical Value/emergent results were called by telephone at the time of interpretation on 06/30/2020 at 3:16 pm to provider Canary Brim , who verbally acknowledged these results. Electronically Signed   By: Donzetta Kohut M.D.   On: 06/30/2020 15:17   Result Date: 06/30/2020 CLINICAL DATA:  Suspected pulmonary embolism in a 50 year old female. EXAM: CT ANGIOGRAPHY CHEST WITH CONTRAST TECHNIQUE: Multidetector CT imaging of the chest was performed using the standard protocol during bolus administration of intravenous contrast. Multiplanar CT image reconstructions and MIPs were obtained to evaluate the vascular anatomy. CONTRAST:  OMNIPAQUE IOHEXOL 350 MG/ML SOLN COMPARISON:  Chest x-ray from Jun 30, 2020. FINDINGS: Cardiovascular: Pulmonary arterial opacification is limited in general exam limited by patient body habitus and respiratory motion. Density of main pulmonary artery approximately 194 Hounsfield units. No central pulmonary embolism. Heart size normal without pericardial effusion. Aortic caliber is normal. Marked motion at the lung bases. Pulmonary vasculature in the LEFT lung base showing limited assessment due to consolidative changes. RIGHT-sided central venous access device terminates in  the distal superior vena cava. Amidst consolidative changes however in the LEFT lower lobe there is an abrupt cut off of a segmental LEFT lower lobe vascular branch that appears different from the adjacent vessel. (Image 47/4) there is surrounding consolidative change that shows variable enhancement, less enhancement laterally than in the medial LEFT lower lobe. RV to LV ratio 0.87 with some straightening of the intra tracheal ir septum. Mediastinum/Nodes: No adenopathy in the mediastinum. No axillary lymphadenopathy. No hilar lymphadenopathy. Lungs/Pleura: Dense basilar consolidative changes on the LEFT. Small LEFT-sided effusion. Minimal RIGHT lower lobe atelectasis. Upper Abdomen: Lobular splenic contours, spleen partially imaged. No acute upper abdominal process to the extent evaluated. Musculoskeletal: No acute musculoskeletal process. Spinal degenerative changes. Review of the MIP images confirms the above findings. IMPRESSION: 1. Pulmonary arterial opacification is limited in general exam limited by patient body habitus and respiratory motion. No central pulmonary embolism but with a vessel "cut off" and area that is suspicious for segmental thrombus in the LEFT lower lobe, associated with airspace disease likely pulmonary infarct. 2. Correlation with lower extremity venous assessment may also be helpful given limitations of the exam as described. 3. Borderline RV to LV ratio with mild straightening of the interventricular septum. Could consider correlation with echocardiography as well given  potential RIGHT heart strain 4. No gross material is seen in the trachea or LEFT mainstem bronchus. 5. Small LEFT-sided effusion. 6. Call is out to the referring provider to further discuss findings in the above case. Electronically Signed: By: Donzetta KohutGeoffrey  Wile M.D. On: 06/30/2020 15:03   DG Chest Port 1 View  Result Date: 06/30/2020 CLINICAL DATA:  Acute respiratory failure with hypoxemia. EXAM: PORTABLE CHEST 1 VIEW  COMPARISON:  Chest x-ray 06/28/2020 FINDINGS: Right internal jugular venous catheter with tip overlying the distal superior vena cava. The heart size and mediastinal contours are unchanged. Low lung volumes . persistent patchy airspace opacity. Left pleural effusion not excluded. No pneumothorax. No acute osseous abnormality. IMPRESSION: 1. Low lung volumes with persistent patchy airspace opacity. 2. Left pleural effusion not excluded. Electronically Signed   By: Tish FredericksonMorgane  Naveau M.D.   On: 06/30/2020 05:02    Scheduled Meds: . Chlorhexidine Gluconate Cloth  6 each Topical Daily  . mouth rinse  15 mL Mouth Rinse BID  . multivitamin with minerals  1 tablet Oral Daily  . pantoprazole (PROTONIX) IV  40 mg Intravenous QHS   Continuous Infusions: . cefTRIAXone (ROCEPHIN)  IV Stopped (06/30/20 2019)  . dextrose 50 mL/hr at 07/01/20 0730  . heparin 1,700 Units/hr (07/01/20 0000)     LOS: 6 days   Time spent: 45min  Azucena FallenWilliam C Odysseus Cada, DO [facility]  If 7PM-7AM, please contact night-coverage [email or web address]  07/01/2020, 7:41 AM

## 2020-07-01 NOTE — Evaluation (Signed)
Occupational Therapy Evaluation Patient Details Name: Felicia Warren MRN: [id number] DOB: 12/29/1970 Today's Date: 07/01/2020    History of Present Illness Patient is a 50 year old female admitted with hypoglycemia and AMS, needing intubation on 5/4 and extubated on 5/8. Unsure of past medical history, patient currently living in [place] plans to move to [place] to be closer to family.   Clinical Impression   Patient lives in [place], plans to move to [place] to be closer to family here. Per chart review found with AMS in motel, patient states living in an apartment. Although patient grossly orient, some increased time for word finding and initiating directions when provided multimodal cues. Grossly patient upper extremity range of motion and strength within functional limits. Patient needing max A x2 to complete rolling in bed to manage from trunk to leg level, difficulty trying to bend knees from bed level. Bed placed in semi-chair position, patient able to use UEs to pull herself forward in bed for ~10 seconds before fatigued. Provided patient with theraband for UE exercises, will continue to follow to maximize patient activity tolerance and mobility needed for participation in self care.    Follow Up Recommendations  SNF    Equipment Recommendations  Other (comment) (to be determined pending mobility)       Precautions / Restrictions Precautions Precautions: Fall Restrictions Weight Bearing Restrictions: No      Mobility Bed Mobility Overal bed mobility: Needs Assistance Bed Mobility: Rolling Rolling: Max assist;+2 for physical assistance;+2 for safety/equipment         General bed mobility comments: provide verbal and tactile cuing for patient to assist bending knee and reach with UE for bed rail. needing use of bed pad and significant assist from therapy to roll from hip level down to come to sidelying position. When seated more upright in semi-chair position patient  able to pull with B UEs to sit forward in bed for ~10 seconds before fatigued.    Transfers                 General transfer comment: unsafe at this time patient on air bed having difficulty with rolling, maxi sky unavailable (not charged)        ADL either performed or assessed with clinical judgement   ADL Overall ADL's : Needs assistance/impaired Eating/Feeding: NPO   Grooming: Wash/dry face;Set up;Bed level   Upper Body Bathing: Moderate assistance;Bed level   Lower Body Bathing: Total assistance;Bed level   Upper Body Dressing : Maximal assistance;Bed level   Lower Body Dressing: Total assistance;Bed level     Toilet Transfer Details (indicate cue type and reason): unable, patient needing significant assist to roll in bed, plan was to maxi sky to chair to attempt further mobility however maxi sky found not on battery and needing to charge Toileting- Clothing Manipulation and Hygiene: Total assistance;Bed level       Functional mobility during ADLs: Maximal assistance;+2 for safety/equipment General ADL Comments: patient presenting with decreased overall activity tolerance needing significant assistance for all ADLs from bed level at this time                  Pertinent Vitals/Pain Pain Assessment: Faces Faces Pain Scale: No hurt     Hand Dominance Right   Extremity/Trunk Assessment Upper Extremity Assessment Upper Extremity Assessment: Overall WFL for tasks assessed   Lower Extremity Assessment Lower Extremity Assessment: Defer to PT evaluation   Cervical / Trunk Assessment Cervical / Trunk Assessment: Other exceptions Cervical /  Trunk Exceptions: body habitus   Communication Communication Communication: No difficulties   Cognition Arousal/Alertness: Awake/alert Behavior During Therapy: WFL for tasks assessed/performed Overall Cognitive Status: No family/caregiver present to determine baseline cognitive functioning                                  General Comments: patient knows where she is, date, that she came to hospital saturday however at times keeping her eyes closed and needing increased time for word finding/ following cues   General Comments  patient currently on 2L O2    Exercises Exercises: Other exercises Other Exercises Other Exercises: provide patient with green theraband and demonstrate UE exercises to perform at bed level        Home Living Family/patient expects to be discharged to:: Private residence Living Arrangements: Alone Available Help at Discharge: Family Type of Home: Apartment Home Access: Stairs to enter     Home Layout: One level               Home Equipment: None   Additional Comments: unsure of accuracy, per chart review patient found in a motel. Patient is alert/oriented x3 however needing increased time for word finding and following directions at times      Prior Functioning/Environment Level of Independence: Independent                 OT Problem List: Decreased activity tolerance;Impaired balance (sitting and/or standing);Decreased safety awareness;Obesity;Decreased knowledge of use of DME or AE      OT Treatment/Interventions: Self-care/ADL training;Therapeutic exercise;DME and/or AE instruction;Therapeutic activities;Patient/family education;Balance training    OT Goals(Current goals can be found in the care plan section) Acute Rehab OT Goals Patient Stated Goal: sit up OT Goal Formulation: With patient Time For Goal Achievement: 07/15/20 Potential to Achieve Goals: Fair  OT Frequency: Min 2X/week           Co-evaluation PT/OT/SLP Co-Evaluation/Treatment: Yes Reason for Co-Treatment: Complexity of the patient's impairments (multi-system involvement);To address functional/ADL transfers;For patient/therapist safety   OT goals addressed during session: ADL's and self-care;Strengthening/ROM      AM-PAC OT "6 Clicks" Daily Activity      Outcome Measure Help from another person eating meals?: Total (board states patient NPO) Help from another person taking care of personal grooming?: A Little Help from another person toileting, which includes using toliet, bedpan, or urinal?: Total Help from another person bathing (including washing, rinsing, drying)?: A Lot Help from another person to put on and taking off regular upper body clothing?: A Lot Help from another person to put on and taking off regular lower body clothing?: Total 6 Click Score: 10   End of Session Equipment Utilized During Treatment: Oxygen Nurse Communication: Mobility status  Activity Tolerance: Patient limited by fatigue Patient left: in bed;with call bell/phone within reach  OT Visit Diagnosis: Other abnormalities of gait and mobility (R26.89)                Time: 6384-6659 OT Time Calculation (min): 28 min Charges:  OT General Charges $OT Visit: 1 Visit OT Evaluation $OT Eval Moderate Complexity: 1 Mod  Marlyce Huge OT OT pager: [phone]  Carmelia Roller 07/01/2020, 1:02 PM

## 2020-07-01 NOTE — Progress Notes (Signed)
Talked to her sister Arline Asp about transfer to room 1437.

## 2020-07-01 NOTE — Progress Notes (Addendum)
Progress Note  Patient Name: Felicia Warren Date of Encounter: 07/01/2020  [facility] Cardiologist: Armanda Magicraci Turner, MD   Subjective   [facility] She understands she needs a sleep study. She wants to move to [place] to be near family. I relayed the importance of following up with PCP with new [facility].   Inpatient Medications    Scheduled Meds: . Chlorhexidine Gluconate Cloth  6 each Topical Daily  . mouth rinse  15 mL Mouth Rinse BID  . multivitamin with minerals  1 tablet Oral Daily  . pantoprazole (PROTONIX) IV  40 mg Intravenous QHS   Continuous Infusions: . cefTRIAXone (ROCEPHIN)  IV Stopped (06/30/20 2019)  . dextrose 50 mL/hr at 07/01/20 0800  . heparin 1,700 Units/hr (07/01/20 0918)   PRN Meds: docusate sodium, polyethylene glycol   Vital Signs    Vitals:   07/01/20 0700 07/01/20 0800 07/01/20 0801 07/01/20 0900  BP: (!) 94/47 (!) 97/40  110/63  Pulse:      Resp: (!) 30 17  (!) 24  Temp:   99.2 F (37.3 C)   TempSrc:   Oral   SpO2:      Weight:      Height:        Intake/Output Summary (Last 24 hours) at 07/01/2020 0950 Last data filed at 07/01/2020 0800 Gross per 24 hour  Intake 2020.47 ml  Output 950 ml  Net 1070.47 ml   Last 3 Weights 07/01/2020 06/30/2020 06/29/2020  Weight (lbs) 548 lb 539 lb 540 lb  Weight (kg) 248.571 kg 244.489 kg 244.942 kg      Telemetry    Sinus in the 80-90s, brief run of atrial tachcyardia/SVT - Personally Reviewed  ECG    No new tracings - Personally Reviewed  Physical Exam   GEN: No acute distress.  - morbidly obese Neck: No JVD - exam difficult Cardiac: RRR, no murmurs, rubs, or gallops.  Respiratory: Clear to auscultation bilaterally. GI: Soft, nontender, non-distended  MS: mild B LE edema Neuro:  Nonfocal  Psych: Normal affect   Labs    High Sensitivity Troponin:   Recent Labs  Lab 06/25/20 1515 06/25/20 1715 06/27/20 1606 06/28/20 0406  TROPONINIHS 120* 205* 368* 284*      Chemistry Recent Labs   Lab 06/26/20 [id number] 06/27/20 0444 06/28/20 0407 06/29/20 0438 06/30/20 0547 06/30/20 2023  NA  --  143 143 144 148* 146*  K  --  3.1* 2.9* 2.9* 2.9* 4.2  CL  --  99 100 104 109 106  CO2  --  26 28 27  35* 32  GLUCOSE  --  125* 120* 116* 96 83  BUN  --  61* 56* 42* 29* 25*  CREATININE  --  3.57* 2.16* 1.05* 0.56 0.74  CALCIUM  --  7.7* 7.5* 7.8* 7.8* 8.4*  PROT 7.0 6.6 6.4*  --   --   --   ALBUMIN 3.3* 3.4* 2.9*  --   --   --   AST 2,160* 1,003* 382*  --   --   --   ALT 912* 757* 574*  --   --   --   ALKPHOS 88 86 71  --   --   --   BILITOT 2.6* 1.7* 1.2  --   --   --   GFRNONAA  --  15* 27* >60 >60 >60  ANIONGAP  --  18* 15 13 4* 8     Hematology Recent Labs  Lab 06/28/20 0407  06/29/20 0438 06/30/20 0547  WBC 10.3 11.3* 13.1*  RBC 4.57 4.69 4.44  HGB 11.1* 11.2* 10.7*  HCT 38.1 38.9 37.9  MCV 83.4 82.9 85.4  MCH 24.3* 23.9* 24.1*  MCHC 29.1* 28.8* 28.2*  RDW 18.1* 18.6* 18.5*  PLT 170 153 145*    BNP Recent Labs  Lab 06/26/20 1435  BNP 315.4*     DDimer  Recent Labs  Lab 06/26/20 1429 06/27/20 1200  DDIMER >20.00* >20.00*     Radiology    CT ANGIO CHEST PE W OR WO CONTRAST  Addendum Date: 06/30/2020   ADDENDUM REPORT: 06/30/2020 15:17 ADDENDUM: In addition to findings outlined in the previous report there is dilation of the main pulmonary artery up to 3.2 cm which may indicate pulmonary arterial hypertension. Critical Value/emergent results were called by telephone at the time of interpretation on 06/30/2020 at 3:16 pm to provider Canary Brim , who verbally acknowledged these results. Electronically Signed   By: Donzetta Kohut M.D.   On: 06/30/2020 15:17   Result Date: 06/30/2020 CLINICAL DATA:  Suspected pulmonary embolism in a 50 year old female. EXAM: CT ANGIOGRAPHY CHEST WITH CONTRAST TECHNIQUE: Multidetector CT imaging of the chest was performed using the standard protocol during bolus administration of intravenous contrast. Multiplanar CT image  reconstructions and MIPs were obtained to evaluate the vascular anatomy. CONTRAST:  OMNIPAQUE IOHEXOL 350 MG/ML SOLN COMPARISON:  Chest x-ray from Jun 30, 2020. FINDINGS: Cardiovascular: Pulmonary arterial opacification is limited in general exam limited by patient body habitus and respiratory motion. Density of main pulmonary artery approximately 194 Hounsfield units. No central pulmonary embolism. Heart size normal without pericardial effusion. Aortic caliber is normal. Marked motion at the lung bases. Pulmonary vasculature in the LEFT lung base showing limited assessment due to consolidative changes. RIGHT-sided central venous access device terminates in the distal superior vena cava. Amidst consolidative changes however in the LEFT lower lobe there is an abrupt cut off of a segmental LEFT lower lobe vascular branch that appears different from the adjacent vessel. (Image 47/4) there is surrounding consolidative change that shows variable enhancement, less enhancement laterally than in the medial LEFT lower lobe. RV to LV ratio 0.87 with some straightening of the intra tracheal ir septum. Mediastinum/Nodes: No adenopathy in the mediastinum. No axillary lymphadenopathy. No hilar lymphadenopathy. Lungs/Pleura: Dense basilar consolidative changes on the LEFT. Small LEFT-sided effusion. Minimal RIGHT lower lobe atelectasis. Upper Abdomen: Lobular splenic contours, spleen partially imaged. No acute upper abdominal process to the extent evaluated. Musculoskeletal: No acute musculoskeletal process. Spinal degenerative changes. Review of the MIP images confirms the above findings. IMPRESSION: 1. Pulmonary arterial opacification is limited in general exam limited by patient body habitus and respiratory motion. No central pulmonary embolism but with a vessel "cut off" and area that is suspicious for segmental thrombus in the LEFT lower lobe, associated with airspace disease likely pulmonary infarct. 2. Correlation with  lower extremity venous assessment may also be helpful given limitations of the exam as described. 3. Borderline RV to LV ratio with mild straightening of the interventricular septum. Could consider correlation with echocardiography as well given potential RIGHT heart strain 4. No gross material is seen in the trachea or LEFT mainstem bronchus. 5. Small LEFT-sided effusion. 6. Call is out to the referring provider to further discuss findings in the above case. Electronically Signed: By: Donzetta Kohut M.D. On: 06/30/2020 15:03   DG Chest Port 1 View  Result Date: 06/30/2020 CLINICAL DATA:  Acute respiratory failure with hypoxemia. EXAM:  PORTABLE CHEST 1 VIEW COMPARISON:  Chest x-ray 06/28/2020 FINDINGS: Right internal jugular venous catheter with tip overlying the distal superior vena cava. The heart size and mediastinal contours are unchanged. Low lung volumes . persistent patchy airspace opacity. Left pleural effusion not excluded. No pneumothorax. No acute osseous abnormality. IMPRESSION: 1. Low lung volumes with persistent patchy airspace opacity. 2. Left pleural effusion not excluded. Electronically Signed   By: Tish Frederickson M.D.   On: 06/30/2020 05:02    Cardiac Studies   Echo 06/26/20: 1. Left ventricular ejection fraction, by estimation, is 55 to 60%. The  left ventricle has normal function. The left ventricle has no regional  wall motion abnormalities. Left ventricular diastolic parameters were  normal. There is the interventricular  septum is flattened in systole and diastole, consistent with right  ventricular pressure and volume overload.  2. McConnell's Sign noted. Right ventricular systolic function is normal  in the base. The right ventricular size is moderately enlarged.  3. The mitral valve is grossly normal. No evidence of mitral valve  regurgitation.  4. The aortic valve is tricuspid. Aortic valve regurgitation is not  visualized. No aortic stenosis is present.  5. There  is mild dilatation of the ascending aorta, measuring 41 mm.   Patient Profile     50 y.o. female with no known hx of cardiac disease who was found on the bed by staff at at hotel and EMS was called due to AMS. BS was 40 and no improvement with Narcan. She had hypoxic respiratory failure with hypercarbia and cxary showed multifocal PNA with AKI and SCr of 3. LFTs were elevated and hypotensive and there was concern for cardiogenic shock. Co-ox was normal and 2D echo showed normal LVF 55-60% and no RWMAs and RV volume overload. She was volume resuscitated and is 611+ today. BNP elevated at 315 and ddimer>20. SCr elevated 3.83. LE venous dopplers are pending. Could not do Chest CTA due to AKI. Cardiology is asked to help with management possible RV failure.  Assessment & Plan    RV failure - echo with normal EF but RV overload with McConnell's sign - CTA yesterday showed LLL PE, although study difficult given body habitus - on heparin gtt until able to take oral medications --> transition to [facility], affordability may be a problem - suspect obesity hypoventilation and OSA are also contributory  - BNP mildly elevated 315, but in the setting of morbid obesity   Elevated troponin - hs troponin 120 --> 205 --> 368 --> 284 - suspect demand ischemia in the setting of hypoglycemia and respiratory failure - will need to work on risk factor modification - we have limited options available to her should she need an ischemic evaluation in the future   Acute respiratory failure - extubated 06/29/20 to BIPAP - PCCM - suspected aspiration pneumonitis in the setting of likely OSA/OHS - small PE also contributory   AKI - resolved   Prediabetes - A1c 6.3% - will recommend follow up with PCP in [place] - was hypoglycemic on arrival   She will need to follow up with PCP in the next 1-2 weeks. Recommended community health and wellness here.  [facility] will sign off.   Medication Recommendations:   Oral anticoagulation for PE Other recommendations (labs, testing, etc):  none Follow up as an outpatient:  As needed with [facility]  For questions or updates, please contact [facility] Please consult [email or web address] for contact info under   Signed, Marcelino Duster,  PA  07/01/2020, 9:50 AM   As above, patient seen and examined.  She denies dyspnea or chest pain today.  CTA yesterday showed possible left lower lobe pulmonary embolus.  She is being anticoagulated her critical care medicine.  RV dysfunction is likely multifactorial including recent pulmonary embolus, obesity hypoventilation syndrome and obstructive sleep apnea.  As outlined previously minimal elevation in troponin with no chest pain.  Echocardiogram showed normal LV function.  ECG changes likely secondary to RV strain.  No plans for further ischemia evaluation.  Cardiology will sign off.  Please call with questions.  She does not require outpatient cardiology follow-up. Olga Millers, MD

## 2020-07-01 NOTE — Discharge Instructions (Signed)

## 2020-07-01 NOTE — Progress Notes (Signed)
[facility]   Reason for visit: Follow up on positive blood culture  Interval History: no new positive blood cultures.  Afebrile, WBC 14.4.  No new complaints.   6 days ceftriaxone  Physical Exam: Constitutional:  Vitals:   07/01/20 1200 07/01/20 1226  BP:    Pulse: 99   Resp: (!) 26   Temp:  99.2 F (37.3 C)  SpO2: 93%    patient appears in NAD Respiratory: Normal respiratory effort; CTA B Cardiovascular: RRR GI: soft, nt, nd  Review of Systems: Constitutional: negative for fevers and chills Gastrointestinal: negative for nausea and diarrhea  Lab Results  Component Value Date   WBC 14.4 (H) 07/01/2020   HGB 10.4 (L) 07/01/2020   HCT 37.6 07/01/2020   MCV 87.0 07/01/2020   PLT 159 07/01/2020    Lab Results  Component Value Date   CREATININE 0.71 07/01/2020   BUN 19 07/01/2020   NA 142 07/01/2020   K 3.9 07/01/2020   CL 106 07/01/2020   CO2 27 07/01/2020    Lab Results  Component Value Date   ALT 209 (H) 07/01/2020   AST 64 (H) 07/01/2020   ALKPHOS 55 07/01/2020     Microbiology: Recent Results (from the past 240 hour(s))  Resp Panel by RT-PCR (Flu A&B, Covid) Nasopharyngeal Swab     Status: None   Collection Time: 06/25/20  3:15 PM   Specimen: Nasopharyngeal Swab; Nasopharyngeal(NP) swabs in vial transport medium  Result Value Ref Range Status   SARS Coronavirus 2 by RT PCR NEGATIVE NEGATIVE Final    Comment: (NOTE) SARS-CoV-2 target nucleic acids are NOT DETECTED.  The SARS-CoV-2 RNA is generally detectable in upper respiratory specimens during the acute phase of infection. The lowest concentration of SARS-CoV-2 viral copies this assay can detect is 138 copies/mL. A negative result does not preclude SARS-Cov-2 infection and should not be used as the sole basis for treatment or other patient management decisions. A negative result may occur with  improper specimen collection/handling, submission of specimen other than  nasopharyngeal swab, presence of viral mutation(s) within the areas targeted by this assay, and inadequate number of viral copies(<138 copies/mL). A negative result must be combined with clinical observations, patient history, and epidemiological information. The expected result is Negative.  Fact Sheet for Patients:  BloggerCourse.com  Fact Sheet for Healthcare Providers:  SeriousBroker.it  This test is no t yet approved or cleared by the [place] FDA and  has been authorized for detection and/or diagnosis of SARS-CoV-2 by FDA under an Emergency Use Authorization (EUA). This EUA will remain  in effect (meaning this test can be used) for the duration of the COVID-19 declaration under Section 564(b)(1) of the Act, 21 U.S.C.section 360bbb-3(b)(1), unless the authorization is terminated  or revoked sooner.       Influenza A by PCR NEGATIVE NEGATIVE Final   Influenza B by PCR NEGATIVE NEGATIVE Final    Comment: (NOTE) The Xpert Xpress SARS-CoV-2/FLU/RSV plus assay is intended as an aid in the diagnosis of influenza from Nasopharyngeal swab specimens and should not be used as a sole basis for treatment. Nasal washings and aspirates are unacceptable for Xpert Xpress SARS-CoV-2/FLU/RSV testing.  Fact Sheet for Patients: BloggerCourse.com  Fact Sheet for Healthcare Providers: SeriousBroker.it  This test is not yet approved or cleared by the [place] FDA and has been authorized for detection and/or diagnosis of SARS-CoV-2 by FDA under an Emergency Use Authorization (EUA). This EUA will remain in  effect (meaning this test can be used) for the duration of the COVID-19 declaration under Section 564(b)(1) of the Act, 21 U.S.C. section 360bbb-3(b)(1), unless the authorization is terminated or revoked.  Performed at [facility], [address], [place], [place] [address]    Culture, blood (routine x 2)     Status: Abnormal   Collection Time: 06/25/20  4:45 PM   Specimen: BLOOD  Result Value Ref Range Status   Specimen Description BLOOD FEMORAL ARTERY  Final   Special Requests   Final    BOTTLES DRAWN AEROBIC AND ANAEROBIC Blood Culture results may not be optimal due to an excessive volume of blood received in culture bottles   Culture  Setup Time   Final    GRAM POSITIVE RODS ANAEROBIC BOTTLE ONLY CRITICAL RESULT CALLED TO, READ BACK BY AND VERIFIED WITH: A. PHAM PHARMD, AT 5400 06/26/20 D. Leighton Roach    Culture (A)  Final    ENTEROCOCCUS FAECIUM STAPHYLOCOCCUS EPIDERMIDIS STAPHYLOCOCCUS SIMULANS THE SIGNIFICANCE OF ISOLATING THIS ORGANISM FROM A SINGLE SET OF BLOOD CULTURES WHEN MULTIPLE SETS ARE DRAWN IS UNCERTAIN. PLEASE NOTIFY THE MICROBIOLOGY DEPARTMENT WITHIN ONE WEEK IF SPECIATION AND SENSITIVITIES ARE REQUIRED. CLOSTRIDIUM PERFRINGENS Standardized susceptibility testing for this organism is not available. Performed at [facility], [address] [place], [place] [address]    Report Status 06/29/2020 FINAL  Final   Organism ID, Bacteria ENTEROCOCCUS FAECIUM  Final      Susceptibility   Enterococcus faecium - MIC*    AMPICILLIN <=2 SENSITIVE Sensitive     VANCOMYCIN <=0.5 SENSITIVE Sensitive     GENTAMICIN SYNERGY SENSITIVE Sensitive     * ENTEROCOCCUS FAECIUM  MRSA PCR Screening     Status: None   Collection Time: 06/25/20 10:00 PM   Specimen: Nasal Mucosa; Nasopharyngeal  Result Value Ref Range Status   MRSA by PCR NEGATIVE NEGATIVE Final    Comment:        The GeneXpert MRSA Assay (FDA approved for NASAL specimens only), is one component of a comprehensive MRSA colonization surveillance program. It is not intended to diagnose MRSA infection nor to guide or monitor treatment for MRSA infections. Performed at [facility], [address] [place], [place] [address]   Culture, blood (routine x 2)     Status:  None   Collection Time: 06/26/20  3:20 AM   Specimen: BLOOD  Result Value Ref Range Status   Specimen Description   Final    BLOOD RIGHT ANTECUBITAL Performed at [facility], [address] [place], [place] [address]    Special Requests   Final    BOTTLES DRAWN AEROBIC ONLY Blood Culture adequate volume Performed at [facility], [address] [place], [place] [address]    Culture   Final    NO GROWTH 5 DAYS Performed at [facility], [address] [place], [place] [address]    Report Status 07/01/2020 FINAL  Final  Culture, Respiratory w Gram Stain     Status: None   Collection Time: 06/26/20  8:19 AM   Specimen: Tracheal Aspirate; Respiratory  Result Value Ref Range Status   Specimen Description   Final    TRACHEAL ASPIRATE Performed at [facility], [address], [place], [place] [address]    Special Requests   Final    NONE Performed at [facility], [address] [place], [place] [address]    Gram Stain  Final    ABUNDANT WBC PRESENT,BOTH PMN AND MONONUCLEAR NO ORGANISMS SEEN    Culture   Final    Normal respiratory flora-no Staph aureus or Pseudomonas seen Performed at [facility], [address], [place], [place] [address]    Report Status 06/28/2020 FINAL  Final    Impression/Plan:  1. Positive blood culture - mulitple organisms and not c/w true infection.  No treatment indicated.  Will continue to observe off of antibiotics.   2. Pneumonia - concern for pneumonia and has been on ceftriaxone.  No current significant symptoms of pneumonia with no fever, no new sob.  She has underlying likely hypoventilation syndrome so some hypoxia expected for that.   I will stop the ceftriaxone now.    I will sign off, call with questions

## 2020-07-01 NOTE — Plan of Care (Signed)
Discussed with patient plan of care for the evening, pain management and possible transfer tonight with some teach back displayed  Problem: Education: Goal: Knowledge of General Education information will improve Description: Including pain rating scale, medication(s)/side effects and non-pharmacologic comfort measures Outcome: Progressing

## 2020-07-02 LAB — COMPREHENSIVE METABOLIC PANEL
ALT: 178 U/L — ABNORMAL HIGH (ref 0–44)
AST: 57 U/L — ABNORMAL HIGH (ref 15–41)
Albumin: 2.7 g/dL — ABNORMAL LOW (ref 3.5–5.0)
Alkaline Phosphatase: 64 U/L (ref 38–126)
Anion gap: 8 (ref 5–15)
BUN: 17 mg/dL (ref 6–20)
CO2: 30 mmol/L (ref 22–32)
Calcium: 8.6 mg/dL — ABNORMAL LOW (ref 8.9–10.3)
Chloride: 105 mmol/L (ref 98–111)
Creatinine, Ser: 0.62 mg/dL (ref 0.44–1.00)
GFR, Estimated: >60 mL/min (ref >60)
Glucose, Bld: 87 mg/dL (ref 70–99)
Potassium: 3.9 mmol/L (ref 3.5–5.1)
Sodium: 143 mmol/L (ref 135–145)
Total Bilirubin: 0.6 mg/dL (ref 0.3–1.2)
Total Protein: 6.8 g/dL (ref 6.5–8.1)

## 2020-07-02 LAB — CBC
HCT: 37.9 % (ref 36.0–46.0)
Hemoglobin: 10.7 g/dL — ABNORMAL LOW (ref 12.0–15.0)
MCH: 24.3 pg — ABNORMAL LOW (ref 26.0–34.0)
MCHC: 28.2 g/dL — ABNORMAL LOW (ref 30.0–36.0)
MCV: 85.9 fL (ref 80.0–100.0)
Platelets: 169 10*3/uL (ref 150–400)
RBC: 4.41 MIL/uL (ref 3.87–5.11)
RDW: 18.2 % — ABNORMAL HIGH (ref 11.5–15.5)
WBC: 17.1 10*3/uL — ABNORMAL HIGH (ref 4.0–10.5)
nRBC: 0 % (ref 0.0–0.2)

## 2020-07-02 LAB — GLUCOSE, CAPILLARY
Glucose-Capillary: 101 mg/dL — ABNORMAL HIGH (ref 70–99)
Glucose-Capillary: 107 mg/dL — ABNORMAL HIGH (ref 70–99)
Glucose-Capillary: 91 mg/dL (ref 70–99)
Glucose-Capillary: 96 mg/dL (ref 70–99)
Glucose-Capillary: 96 mg/dL (ref 70–99)

## 2020-07-02 LAB — PROTIME-INR
INR: 1.6 — ABNORMAL HIGH (ref 0.8–1.2)
Prothrombin Time: 19.1 seconds — ABNORMAL HIGH (ref 11.4–15.2)

## 2020-07-02 MED ORDER — PANTOPRAZOLE SODIUM 40 MG PO TBEC
40.0000 mg | DELAYED_RELEASE_TABLET | Freq: Every day | ORAL | Status: DC
  Administered 2020-07-02 – 2020-07-24 (×23): 40 mg via ORAL
  Filled 2020-07-02 (×23): qty 1

## 2020-07-02 MED ORDER — PROSOURCE PLUS PO LIQD
30.0000 mL | Freq: Three times a day (TID) | ORAL | Status: DC
  Administered 2020-07-02 – 2020-07-25 (×47): 30 mL via ORAL
  Filled 2020-07-02 (×51): qty 30

## 2020-07-02 NOTE — Progress Notes (Signed)
Nutrition Follow-up  DOCUMENTATION CODES:  Morbid obesity  INTERVENTION:  Continue to advanced diet per SLP.  Add Magic cup TID with meals, each supplement provides 290 kcal and 9 grams of protein.  Add 30 ml ProSource po TID, each supplement provides 100 kcal and 15 grams of protein.   Continue MVI with minerals daily.  NUTRITION DIAGNOSIS:  Inadequate oral intake related to inability to eat as evidenced by NPO status. - completed, now on diet  GOAL:  Patient will meet greater than or equal to 90% of their needs - progressing  MONITOR:  PO intake,Supplement acceptance,Diet advancement,Labs,Weight trends  REASON FOR ASSESSMENT:  Ventilator,Consult Enteral/tube feeding initiation and management  ASSESSMENT:  50 year-old female who presented as a Felicia Warren, found with AMS in a motel by staff.  It is unknown when her last known well was.  EMS found patient awake by CBG of 40 mg/dl.  This was corrected, but she continued to have worsening mental status; Narcan did not provide any response.  In ED she required NRB mask and eventual intubation for hypoxic respiratory failure.  CXR with probable multifocal PNA. 5/4 - intubated; OGT placed 5/5 - RUQ [place] with cholelithiasis but no cholecystitis; TF started 5/7 - diarrhea 5/8 - extubated; OGT removed; Dys 2 with thins started  Previous TF regimen via OGT: Vital High Protein @ 20 ml/hr  90 ml Prosource TF x5/day  This regimen + kcal from current propofol rate (1188 kcal) will provide 2068 kcal, 152 grams protein, and 401 ml free water.   Spoke with pt at bedside briefly. She reports having her appetite back and eating better. She does not like when the food arrives cold, but she still eats it due to hunger. No pt meals documented per Epic since pt's extubation.  Admit wt: 190.5 kg Current wt: 244.9 kg  RD to add Magic Cup TID and ProSource Plus TID to promote caloric and protein intake. Also recommend continuing MVI with minerals  daily.  Medications: reviewed; MVI with minerals, Protonix  Labs: reviewed; CBG 91-123  Diet Order:   Diet Order            DIET DYS 2 Room service appropriate? Yes; Fluid consistency: Thin  Diet effective now                EDUCATION NEEDS:  Education needs have been addressed  Skin:  Skin Assessment: Reviewed RN Assessment  Last BM:  07/01/20 - Type 6, large amount  Height:  Ht Readings from Last 1 Encounters:  06/25/20 5\' 6"  (1.676 m)   Weight:  Wt Readings from Last 1 Encounters:  07/02/20 (!) 244.9 kg   Ideal Body Weight:  59.1 kg  BMI:  Body mass index is 87.16 kg/m.  Estimated Nutritional Needs: based on IBW Kcal:  1650-1850 Protein:  125-140 grams Fluid:  >1.65 L  09/01/20, RD, LDN Registered Dietitian After Hours/Weekend Pager # in [place]

## 2020-07-02 NOTE — Progress Notes (Signed)
Progress Note    Felicia Warren   ZOX:[id number]  DOB: 03/07/1970  DOA: 06/25/2020     7  PCP: Pcp, No  CC: AMS  Hospital Course: 50 yo F who presented initially as a Erskine SquibbJane Warren, found with altered mental status in a motel by staff. It is unknown when her last known well was. EMS found patient awake, but altered with aglucoseof40. This was corrected, but she continued to have worsening mental status. Narcan did not provide any response. In ED she required nonrebreather mask and eventual intubation for hypoxic respiratory failure. Chest x-ray with probable multifocal pneumonia. AKI, elevated INR on admit. No prior history in epic as [place] is from [place] and no known emergency contact. Admitted by PCCM given requirement for airway protection/intubation.   5/4 presented to ED with hypoglycemia and AMS, required intubation, PCCM consult. CXR concerning for PNA. Unasyn initiated. [facility]. UDS neg, neg tylenol/ salicylates. CTH neg. Lactic acid 5.9->4.9  5/5 Hypotensive, responded to IVF + albumin, on heparin gtt but stopped due to platelet decline, not on pressors, requiring PEEP 10 / FiO2 80. RUQ US with cholelithiasis but no cholecystitis.   5/6 Vent needs decreased. LE venous duplex negative.   5/7 diarrhea > Placed flexiseal 5/8  5/8 WUA/SBT, extubated   Interval History:  No events overnight.  Was lying in bed on BiPAP this morning but taken off of it and placed on nasal cannula for our conversation.  She communicated well.  Some drowsiness but was waking up easier while talking. Continues to state that her goal is to get moved from [place] down to [place] to be back closer to her family.  ROS: Constitutional: negative for chills and fevers, Respiratory: negative for cough, Cardiovascular: negative for chest pain and Gastrointestinal: negative for abdominal pain  Assessment & Plan: Acute Metabolic Encephalopathy secondary to Hypoglycemia & Hypercapnic  Respiratory Failure Super(Morbid) Obesity - BMI 88 - Obesity hypoventilation syndrome extremely likely given hypercarbia and elevated bicarbonate despite respiratory recovery.  - CT head, UDS, and ammonia unremarkable. - Mental status resolving with supportive care - now off ventilator >48h  - Minimize sedating medications  - Nocturnal BiPAP in the interim per PCCM while she continues to recover  Acute Hypercarbic / Hypoxemic Respiratory Failure requiring IPPV, POA Suspected initially secondary to CAP/Aspiration with concurrent OHS (BMI 88), POA - Completed abx course 06/30/20 - Strep/legionella urinay antigens negative.  - Blood cultures likely contaminant given only1 of2 from May 4th growing muliple species  - Repeat cultures 5/5 remain negative - Will need outpatient sleep study> [place]  Suspected LLL segmental PE, Likely POA D Dimer elevation - Greater than 20 on 5/6 - LLL segmental PE suspected on imaging - Transition to xarelto - discussed with pharmacy  AKI, undetermined acuity/unkown baseline, resolved AGMA/lactic acidosis, resolved Hypokalemia, resolved - Trend BMP / urinary output - Avoid nephrotoxic agents, ensure adequate renal perfusion  Elevated Troponin\Likely supply demand mismatch in the setting of profound hypoxia Elevated D Dimer  McConnell's Sign - Troponin downtrending with oxygenation - Echo = LVEF 55-60%, no WMA, McConnell's sign with moderately enlarged RV, interventricular septal flattening concerning for RV overload (likely due to OHS as above) - Suspected LLL segmental PE as above - on xarelto  Elevated liver enzymes - Cannot rule out shock liver given profound hypoxia - RUQ US negative. Acute hepatitis panel negative. - Labs downtrending appropriately  Hypoglycemia -resolved - Glucose in 40's on  admit, no available hx - Resolved with dextrose infusion  Diarrhea, resolving - Likely in  setting of TF + bowel regimen  -PRN colace, miralax  Anemia, normocytic - likely chronic anemia of chronic disease given above  -trend CBC  -transfuse for Hgb <7%  Old records reviewed in assessment of this patient   DVT prophylaxis: Place and maintain sequential compression device Start: 06/26/20 1150 SCDs Start: 06/25/20 1828 Rivaroxaban (XARELTO) tablet 15 mg  rivaroxaban (XARELTO) tablet 20 mg   Code Status:   Code Status: Full Code Family Communication:   Disposition Plan: Status is: Inpatient  Remains inpatient appropriate because:Unsafe d/c plan and Inpatient level of care appropriate due to severity of illness   Dispo: The patient is from: Home              Anticipated d/c is to: Pending safe dispo              Patient currently is not medically stable to d/c.   Difficult to place patient No  Risk of unplanned readmission score: Unplanned Admission- Pilot do not use: 13.12   Objective: Blood pressure 139/68, pulse (!) 101, temperature 99.1 F (37.3 C), temperature source Axillary, resp. rate 20, height  (1.676 m), weight (!) 244.9 kg, SpO2 91 %.  Examination: General appearance: alert, cooperative, fatigued and no distress Head: Normocephalic, without obvious abnormality, atraumatic Eyes: EOMI Lungs: clear to auscultation bilaterally Heart: regular rate and rhythm and S1, S2 normal Abdomen: normal findings: bowel sounds normal and soft, non-tender Extremities: Obese Skin: mobility and turgor normal Neurologic: Grossly normal  Consultants:     Procedures:     Data Reviewed: I have personally reviewed following labs and imaging studies Results for orders placed or performed during the hospital encounter of 06/25/20 (from the past 24 hour(s))  Glucose, capillary     Status: Abnormal   Collection Time: 07/01/20  7:28 PM  Result Value Ref Range   Glucose-Capillary 123 (H) 70 - 99 mg/dL  Glucose, capillary     Status: None   Collection Time:  07/02/20 12:39 AM  Result Value Ref Range   Glucose-Capillary 96 70 - 99 mg/dL  Glucose, capillary     Status: None   Collection Time: 07/02/20  4:29 AM  Result Value Ref Range   Glucose-Capillary 91 70 - 99 mg/dL  CBC     Status: Abnormal   Collection Time: 07/02/20  4:30 AM  Result Value Ref Range   WBC 17.1 (H) 4.0 - 10.5 K/uL   RBC 4.41 3.87 - 5.11 MIL/uL   Hemoglobin 10.7 (L) 12.0 - 15.0 g/dL   HCT 78.2 95.6 - 21.3 %   MCV 85.9 80.0 - 100.0 fL   MCH 24.3 (L) 26.0 - 34.0 pg   MCHC 28.2 (L) 30.0 - 36.0 g/dL   RDW 08.6 (H) 57.8 - 46.9 %   Platelets 169 150 - 400 K/uL   nRBC 0.0 0.0 - 0.2 %  Comprehensive metabolic panel     Status: Abnormal   Collection Time: 07/02/20  4:30 AM  Result Value Ref Range   Sodium 143 135 - 145 mmol/L   Potassium 3.9 3.5 - 5.1 mmol/L   Chloride 105 98 - 111 mmol/L   CO2 30 22 - 32 mmol/L   Glucose, Bld 87 70 - 99 mg/dL   BUN 17 6 - 20 mg/dL   Creatinine, Ser 6.29 0.44 - 1.00 mg/dL   Calcium 8.6 (L) 8.9 - 10.3 mg/dL  Total Protein 6.8 6.5 - 8.1 g/dL   Albumin 2.7 (L) 3.5 - 5.0 g/dL   AST 57 (H) 15 - 41 U/L   ALT 178 (H) 0 - 44 U/L   Alkaline Phosphatase 64 38 - 126 U/L   Total Bilirubin 0.6 0.3 - 1.2 mg/dL   GFR, Estimated >85 >88 mL/min   Anion gap 8 5 - 15  Protime-INR     Status: Abnormal   Collection Time: 07/02/20  4:30 AM  Result Value Ref Range   Prothrombin Time 19.1 (H) 11.4 - 15.2 seconds   INR 1.6 (H) 0.8 - 1.2  Glucose, capillary     Status: Abnormal   Collection Time: 07/02/20  7:44 AM  Result Value Ref Range   Glucose-Capillary 101 (H) 70 - 99 mg/dL   Comment 1 Notify RN    Comment 2 Document in Chart   Glucose, capillary     Status: None   Collection Time: 07/02/20 11:53 AM  Result Value Ref Range   Glucose-Capillary 96 70 - 99 mg/dL   Comment 1 Notify RN    Comment 2 Document in Chart     Recent Results (from the past 240 hour(s))  Resp Panel by RT-PCR (Flu A&B, Covid) Nasopharyngeal Swab     Status: None    Collection Time: 06/25/20  3:15 PM   Specimen: Nasopharyngeal Swab; Nasopharyngeal(NP) swabs in vial transport medium  Result Value Ref Range Status   SARS Coronavirus 2 by RT PCR NEGATIVE NEGATIVE Final    Comment: (NOTE) SARS-CoV-2 target nucleic acids are NOT DETECTED.  The SARS-CoV-2 RNA is generally detectable in upper respiratory specimens during the acute phase of infection. The lowest concentration of SARS-CoV-2 viral copies this assay can detect is 138 copies/mL. A negative result does not preclude SARS-Cov-2 infection and should not be used as the sole basis for treatment or other patient management decisions. A negative result may occur with  improper specimen collection/handling, submission of specimen other than nasopharyngeal swab, presence of viral mutation(s) within the areas targeted by this assay, and inadequate number of viral copies(<138 copies/mL). A negative result must be combined with clinical observations, patient history, and epidemiological information. The expected result is Negative.  Fact Sheet for Patients:  BloggerCourse.com  Fact Sheet for Healthcare Providers:  SeriousBroker.it  This test is no t yet approved or cleared by the [place] FDA and  has been authorized for detection and/or diagnosis of SARS-CoV-2 by FDA under an Emergency Use Authorization (EUA). This EUA will remain  in effect (meaning this test can be used) for the duration of the COVID-19 declaration under Section 564(b)(1) of the Act, 21 U.S.C.section 360bbb-3(b)(1), unless the authorization is terminated  or revoked sooner.       Influenza A by PCR NEGATIVE NEGATIVE Final   Influenza B by PCR NEGATIVE NEGATIVE Final    Comment: (NOTE) The Xpert Xpress SARS-CoV-2/FLU/RSV plus assay is intended as an aid in the diagnosis of influenza from Nasopharyngeal swab specimens and should not be used as a sole basis for treatment.  Nasal washings and aspirates are unacceptable for Xpert Xpress SARS-CoV-2/FLU/RSV testing.  Fact Sheet for Patients: BloggerCourse.com  Fact Sheet for Healthcare Providers: SeriousBroker.it  This test is not yet approved or cleared by the [place] FDA and has been authorized for detection and/or diagnosis of SARS-CoV-2 by FDA under an Emergency Use Authorization (EUA). This EUA will remain in effect (meaning this test can be used) for the duration of the COVID-19  declaration under Section 564(b)(1) of the Act, 21 U.S.C. section 360bbb-3(b)(1), unless the authorization is terminated or revoked.  Performed at [facility], [address], [place], [place] [address]   Culture, blood (routine x 2)     Status: Abnormal   Collection Time: 06/25/20  4:45 PM   Specimen: BLOOD  Result Value Ref Range Status   Specimen Description BLOOD FEMORAL ARTERY  Final   Special Requests   Final    BOTTLES DRAWN AEROBIC AND ANAEROBIC Blood Culture results may not be optimal due to an excessive volume of blood received in culture bottles   Culture  Setup Time   Final    GRAM POSITIVE RODS ANAEROBIC BOTTLE ONLY CRITICAL RESULT CALLED TO, READ BACK BY AND VERIFIED WITH: A. PHAM PHARMD, AT 7253 06/26/20 D. Leighton Roach    Culture (A)  Final    ENTEROCOCCUS FAECIUM STAPHYLOCOCCUS EPIDERMIDIS STAPHYLOCOCCUS SIMULANS THE SIGNIFICANCE OF ISOLATING THIS ORGANISM FROM A SINGLE SET OF BLOOD CULTURES WHEN MULTIPLE SETS ARE DRAWN IS UNCERTAIN. PLEASE NOTIFY THE MICROBIOLOGY DEPARTMENT WITHIN ONE WEEK IF SPECIATION AND SENSITIVITIES ARE REQUIRED. CLOSTRIDIUM PERFRINGENS Standardized susceptibility testing for this organism is not available. Performed at [facility], [address] [place], [place] [address]    Report Status 06/29/2020 FINAL  Final   Organism ID, Bacteria ENTEROCOCCUS FAECIUM  Final      Susceptibility   Enterococcus faecium -  MIC*    AMPICILLIN <=2 SENSITIVE Sensitive     VANCOMYCIN <=0.5 SENSITIVE Sensitive     GENTAMICIN SYNERGY SENSITIVE Sensitive     * ENTEROCOCCUS FAECIUM  MRSA PCR Screening     Status: None   Collection Time: 06/25/20 10:00 PM   Specimen: Nasal Mucosa; Nasopharyngeal  Result Value Ref Range Status   MRSA by PCR NEGATIVE NEGATIVE Final    Comment:        The GeneXpert MRSA Assay (FDA approved for NASAL specimens only), is one component of a comprehensive MRSA colonization surveillance program. It is not intended to diagnose MRSA infection nor to guide or monitor treatment for MRSA infections. Performed at [facility], [address], [place], [place] [address]   Culture, blood (routine x 2)     Status: None   Collection Time: 06/26/20  3:20 AM   Specimen: BLOOD  Result Value Ref Range Status   Specimen Description   Final    BLOOD RIGHT ANTECUBITAL Performed at [facility], [address] [place], [place] [address]    Special Requests   Final    BOTTLES DRAWN AEROBIC ONLY Blood Culture adequate volume Performed at [facility], [address] [place], [place] [address]    Culture   Final    NO GROWTH 5 DAYS Performed at [facility], [address], [place], [place] [address]    Report Status 07/01/2020 FINAL  Final  Culture, Respiratory w Gram Stain     Status: None   Collection Time: 06/26/20  8:19 AM   Specimen: Tracheal Aspirate; Respiratory  Result Value Ref Range Status   Specimen Description   Final    TRACHEAL ASPIRATE Performed at [facility], [address], [place], [place] [address]    Special Requests   Final    NONE Performed at [facility], [address], [place], [place] [address]    Gram Stain   Final    ABUNDANT WBC PRESENT,BOTH PMN AND MONONUCLEAR NO ORGANISMS  SEEN    Culture   Final    Normal respiratory flora-no Staph aureus or  Pseudomonas seen Performed at [facility], [address] [place], [place] [address]    Report Status 06/28/2020 FINAL  Final     Radiology Studies: No results found. CT ANGIO CHEST PE W OR WO CONTRAST  Final Result  Addendum 1 of 1  ADDENDUM REPORT: 06/30/2020 15:17    ADDENDUM:  In addition to findings outlined in the previous report there is  dilation of the main pulmonary artery up to 3.2 cm which may  indicate pulmonary arterial hypertension.    Critical Value/emergent results were called by telephone at the time  of interpretation on 06/30/2020 at 3:16 pm to provider Canary Brim ,  who verbally acknowledged these results.      Electronically Signed    By: Donzetta Kohut M.D.    On: 06/30/2020 15:17      Final    DG Chest Port 1 View  Final Result    DG CHEST PORT 1 VIEW  Final Result    VAS [place] LOWER EXTREMITY VENOUS (DVT)  Final Result    US Abdomen Limited RUQ (LIVER/GB)  Final Result    DG Chest 1 View  Final Result    US RENAL  Final Result    DG CHEST PORT 1 VIEW  Final Result    CT Head Wo Contrast  Final Result    DG Chest Portable 1 View  Final Result      Scheduled Meds: . (feeding supplement) PROSource Plus  30 mL Oral TID BM  . mouth rinse  15 mL Mouth Rinse BID  . multivitamin with minerals  1 tablet Oral Daily  . pantoprazole  40 mg Oral QHS  . Rivaroxaban  15 mg Oral BID WC   Followed by  . [START ON 07/22/2020] rivaroxaban  20 mg Oral Q supper   PRN Meds: docusate sodium, polyethylene glycol Continuous Infusions:   LOS: 7 days  Time spent: Greater than 50% of the 35 minute visit was spent in counseling/coordination of care for the patient as laid out in the A&P.   Lewie Chamber, MD [facility] 07/02/2020, 6:24 PM

## 2020-07-03 LAB — CBC WITH DIFFERENTIAL/PLATELET
Abs Immature Granulocytes: 0.25 10*3/uL — ABNORMAL HIGH (ref 0.00–0.07)
Basophils Absolute: 0.1 10*3/uL (ref 0.0–0.1)
Basophils Relative: 0 %
Eosinophils Absolute: 0.3 10*3/uL (ref 0.0–0.5)
Eosinophils Relative: 1 %
HCT: 38.2 % (ref 36.0–46.0)
Hemoglobin: 10.3 g/dL — ABNORMAL LOW (ref 12.0–15.0)
Immature Granulocytes: 1 %
Lymphocytes Relative: 5 %
Lymphs Abs: 0.9 10*3/uL (ref 0.7–4.0)
MCH: 23.5 pg — ABNORMAL LOW (ref 26.0–34.0)
MCHC: 27 g/dL — ABNORMAL LOW (ref 30.0–36.0)
MCV: 87.2 fL (ref 80.0–100.0)
Monocytes Absolute: 1.1 10*3/uL — ABNORMAL HIGH (ref 0.1–1.0)
Monocytes Relative: 6 %
Neutro Abs: 15.9 10*3/uL — ABNORMAL HIGH (ref 1.7–7.7)
Neutrophils Relative %: 87 %
Platelets: 234 10*3/uL (ref 150–400)
RBC: 4.38 MIL/uL (ref 3.87–5.11)
RDW: 18.2 % — ABNORMAL HIGH (ref 11.5–15.5)
WBC: 18.5 10*3/uL — ABNORMAL HIGH (ref 4.0–10.5)
nRBC: 0 % (ref 0.0–0.2)

## 2020-07-03 LAB — COMPREHENSIVE METABOLIC PANEL
ALT: 134 U/L — ABNORMAL HIGH (ref 0–44)
AST: 41 U/L (ref 15–41)
Albumin: 2.9 g/dL — ABNORMAL LOW (ref 3.5–5.0)
Alkaline Phosphatase: 59 U/L (ref 38–126)
Anion gap: 6 (ref 5–15)
BUN: 12 mg/dL (ref 6–20)
CO2: 31 mmol/L (ref 22–32)
Calcium: 8.6 mg/dL — ABNORMAL LOW (ref 8.9–10.3)
Chloride: 104 mmol/L (ref 98–111)
Creatinine, Ser: 0.74 mg/dL (ref 0.44–1.00)
GFR, Estimated: >60 mL/min (ref >60)
Glucose, Bld: 100 mg/dL — ABNORMAL HIGH (ref 70–99)
Potassium: 3.6 mmol/L (ref 3.5–5.1)
Sodium: 141 mmol/L (ref 135–145)
Total Bilirubin: 1 mg/dL (ref 0.3–1.2)
Total Protein: 6.7 g/dL (ref 6.5–8.1)

## 2020-07-03 LAB — MAGNESIUM: Magnesium: 1.8 mg/dL (ref 1.7–2.4)

## 2020-07-03 LAB — GLUCOSE, CAPILLARY
Glucose-Capillary: 93 mg/dL (ref 70–99)
Glucose-Capillary: 93 mg/dL (ref 70–99)
Glucose-Capillary: 129 mg/dL — ABNORMAL HIGH (ref 70–99)
Glucose-Capillary: 104 mg/dL — ABNORMAL HIGH (ref 70–99)
Glucose-Capillary: 103 mg/dL — ABNORMAL HIGH (ref 70–99)

## 2020-07-03 NOTE — Progress Notes (Signed)
Speech Language Pathology Treatment: Dysphagia  Patient Details Name: Felicia Warren MRN: [id number] DOB: 04-Jun-1970 Today's Date: 07/03/2020 Time: 7939-0300 SLP Time Calculation (min) (ACUTE ONLY): 16 min  Assessment / Plan / Recommendation Clinical Impression  Skilled SLP follow up for dysphagia management indicated given her dysphonia/dysphagia concerns 4 days post intubation.  Today pt alert, poorly positioned in bed for po.  She denies having coughing associated with intake and reports phonation strength/quality is normal.   Vocal hoarseness noted on evaluation has resolved - soft voice is baseline per pt- and thus laryngeal integrity likely intact.  Pt easily passed Yale 3 ounce water test and swallow/respiration appreciated, indicating low risk of aspiration.  Observed pt consuming solids with prolonged mastication noted - but pt appeared distracted by prior discussion with RN and SlP re: pt's bed.  Adequate oral clearance per observation.  SLP concerned momentarily re: pt's mentation, but was oriented to self, place, situation at this time.     Pt does admit to some difficulty masticating "hard foods" and is observed to have HR 109 at rest with mildly incr RR, thus recommend dys3/thin for maximal airway protection.  Advised pt to take rest breaks if dyspneic and stay upright after meals.  No SLP follow up indicated as  Will sign off.    HPI HPI: 50 y/o Female who presented as a Felicia Warren, found with altered mental status in a motel by staff.  It was unknown when her last known well was.  EMS found patient awake, but altered glucose was initially 40.  This was corrected, but she continued to have worsening mental status.  Narcan did not provide any response.  In ED she required nonrebreather mask and eventual intubation for hypoxic respiratory failure.  Chest x-ray with probable multifocal pneumonia.  Labs partially completed at the time of admission, but notable for white blood cell count of 15  K, creatinine 3 and INR of 2.1.  Metabolic panel, ABG, troponin, lactic acid CT head all pending.  At the time of exam, patient was sleeping while sedated with propofol, however was arousable to sternal rub and following commands.  Most recent chext xray dated 05/07 was showing mild cardiomegaly with diffuse patchy parahilar fluffy opacities, similar, favoring cardiogenic pulmonary edema vs atypical infection, low lung volumes and possible small bilateral pleural effusions.   CT of the head was showing no acute intracranial abnormality. Pt has been managed on a dys2/thin diet. She admits to some premorbid difficulties masticating hard foods but otherwise denies dysphagia.  Acute dysphagia due to four day intubation noted with pt extubated on 4/8.  CT of chest 4/9 showed cut off" and suspicious for segmental thrombus in the LEFT lower lobe, associated  with airspace disease likely pulmonary infarct and small left pleural effusion.  Follow up to assure po tolerance and readiness for dietary advancement indicated. Marland Kitchen      SLP Plan  All goals met       Recommendations  Diet recommendations: Dysphagia 3 (mechanical soft);Thin liquid Medication Administration: Crushed with puree Supervision: Patient able to self feed Compensations: Slow rate;Small sips/bites Postural Changes and/or Swallow Maneuvers: Seated upright 90 degrees;Upright 30-60 min after meal                Oral Care Recommendations: Oral care BID Follow up Recommendations: Other (comment) (TBD) SLP Visit Diagnosis: Dysphagia, oral phase (R13.11) Plan: All goals met       GO  Felicia Warren 07/03/2020, 11:33 AM  Felicia Lime, MS [facility] SLP Acute Rehab Services Office [phone] Pager [phone]

## 2020-07-03 NOTE — TOC Progression Note (Signed)
Transition of Care [facility]) - Progression Note    Patient Details  Name: Felicia Warren MRN: [id number] Date of Birth: 1970/08/13  Transition of Care [facility]) CM/SW Contact  Geni Bers, RN Phone Number: 07/03/2020, 3:08 PM  Clinical Narrative:    Spoke with pt's sister and niece concerning discharge plans. Pt has is self pay from [place]. Pt came to [facility] for Graduation and never appeared. Family did not know pt was in the hospital until someone called them. At present time. The family can not take pt into their home related to stairs. Pt's sister will apply for Medicaid for pt. Encourage sister to call financial advisor. Will continue to follow.      Expected Discharge Plan: Homeless Shelter Barriers to Discharge: Continued Medical Work up  Expected Discharge Plan and Services Expected Discharge Plan: Homeless Shelter       Living arrangements for the past 2 months: Hotel/Motel                                       Social Determinants of Health (SDOH) Interventions    Readmission Risk Interventions No flowsheet data found.

## 2020-07-03 NOTE — Progress Notes (Signed)
O2 sat noted to be 76-83% on 3L of nasal cannula. Bumped up to 4L and o2 saturation jumped up to lower 90's (93-94%.) Patient arouses during this episode, alert and oriented x 4. Will continue to monitor.

## 2020-07-03 NOTE — Progress Notes (Signed)
Progress Note    Felicia Warren   XBJ:[id number]  DOB: [birth year]  DOA: 06/25/2020     8  PCP: Pcp, No  CC: AMS  Hospital Course: 50 yo F who presented initially as a Felicia Warren, found with altered mental status in a motel by staff. It is unknown when her last known well was. EMS found patient awake, but altered with aglucoseof40. This was corrected, but she continued to have worsening mental status. Narcan did not provide any response. In ED she required nonrebreather mask and eventual intubation for hypoxic respiratory failure. Chest x-ray with probable multifocal pneumonia. AKI, elevated INR on admit. No prior history in epic as she is from [place] and no known emergency contact. Admitted by PCCM given requirement for airway protection/intubation.   5/4 presented to ED with hypoglycemia and AMS, required intubation, PCCM consult. CXR concerning for PNA. Unasyn initiated. Tx to [facility] for beds. UDS neg, neg tylenol/ salicylates. CTH neg. Lactic acid 5.9->4.9  5/5 Hypotensive, responded to IVF + albumin, on heparin gtt but stopped due to platelet decline, not on pressors, requiring PEEP 10 / FiO2 80. RUQ [place] with cholelithiasis but no cholecystitis.   5/6 Vent needs decreased. LE venous duplex negative.   5/7 diarrhea > Placed flexiseal 5/8  5/8 WUA/SBT, extubated   Interval History:  No events overnight.  Had some desaturation while on oxygen and was turned up this morning some.  She is otherwise appearing more awake and alert compared to yesterday. Attempted to call her Sister Felicia Warren but no answer this afternoon. Patient is otherwise still hoping to discharge back to [place] with ultimate plan to move to [place].  She is currently still oxygen dependent which is new for her and would theoretically need oxygen at discharge unless can be weaned off.  ROS: Constitutional: negative for chills and fevers, Respiratory: negative for cough, Cardiovascular: negative for chest pain  and Gastrointestinal: negative for abdominal pain  Assessment & Plan: Acute Metabolic Encephalopathy secondary to Hypoglycemia & Hypercapnic Respiratory Failure Super(Morbid) Obesity - BMI 88 - Obesity hypoventilation syndrome extremely likely given hypercarbia and elevated bicarbonate despite respiratory recovery.  - CT head, UDS, and ammonia unremarkable. - Mental status resolving with supportive care - now off ventilator >48h  - Minimize sedating medications  - Nocturnal BiPAP  Acute Hypercarbic / Hypoxemic Respiratory Failure requiring IPPV, POA Suspected initially secondary to CAP/Aspiration with concurrent OHS (BMI 88), POA - Completed abx course 06/30/20 - Strep/legionella urinay antigens negative.  - Blood cultures likely contaminant given only1 of2 from May 4th growing muliple species  - Repeat cultures 5/5 remain negative - Will need outpatient sleep study> she lives in [place] likely need referral from PCP -Remains dependent on oxygen.  Currently 3 to 4 L.  If unable to wean off prior to discharge, will require home O2 which may be another barrier  Suspected LLL segmental PE, Likely POA D Dimer elevation - Greater than 20 on 5/6 - LLL segmental PE suspected on imaging - Transition to xarelto - discussed with pharmacy  AKI, undetermined acuity/unkown baseline, resolved AGMA/lactic acidosis, resolved Hypokalemia, resolved - Trend BMP / urinary output - Avoid nephrotoxic agents, ensure adequate renal perfusion  Elevated Troponin\Likely supply demand mismatch in the setting of profound hypoxia Elevated D Dimer  McConnell's Sign - Troponin downtrending with oxygenation - Echo = LVEF 55-60%, no WMA, McConnell's sign with moderately enlarged RV, interventricular septal flattening concerning for RV overload (likely due to OHS as above) - Suspected LLL segmental PE  as above - on xarelto  Elevated liver enzymes - Cannot rule out shock liver given profound  hypoxia - RUQ [place] negative. Acute hepatitis panel negative. - Labs downtrending appropriately  Hypoglycemia -resolved - Glucose in 40's on admit, no available hx - Resolved with dextrose infusion  Diarrhea, resolving - Likely in setting of TF + bowel regimen  -PRN colace, miralax  Anemia, normocytic - likely chronic anemia of chronic disease given above  -trend CBC  -transfuse for Hgb <7%  Old records reviewed in assessment of this patient   DVT prophylaxis: Place and maintain sequential compression device Start: 06/26/20 1150 SCDs Start: 06/25/20 1828 Rivaroxaban (XARELTO) tablet 15 mg  rivaroxaban (XARELTO) tablet 20 mg   Code Status:   Code Status: Full Code Family Communication:   Disposition Plan: Status is: Inpatient  Remains inpatient appropriate because:Unsafe d/c plan and Inpatient level of care appropriate due to severity of illness   Dispo: The patient is from: Home              Anticipated d/c is to: Pending safe dispo              Patient currently is not medically stable to d/c.   Difficult to place patient No  Risk of unplanned readmission score: Unplanned Admission- Pilot do not use: 13.28   Objective: Blood pressure 126/68, pulse (!) 105, temperature 98 F (36.7 C), temperature source Oral, resp. rate 20, height 5\' 6"  (1.676 m), weight (!) 244.9 kg, SpO2 94 %.  Examination: General appearance: alert, cooperative, fatigued and no distress Head: Normocephalic, without obvious abnormality, atraumatic Eyes: EOMI Lungs: clear to auscultation bilaterally Heart: regular rate and rhythm and S1, S2 normal Abdomen: normal findings: bowel sounds normal and soft, non-tender Extremities: Obese Skin: mobility and turgor normal Neurologic: Grossly normal  Consultants:     Procedures:     Data Reviewed: I have personally reviewed following labs and imaging studies Results for orders placed or performed during the hospital encounter of 06/25/20 (from  the past 24 hour(s))  Glucose, capillary     Status: Abnormal   Collection Time: 07/02/20  8:32 PM  Result Value Ref Range   Glucose-Capillary 107 (H) 70 - 99 mg/dL  Comprehensive metabolic panel     Status: Abnormal   Collection Time: 07/03/20  3:56 AM  Result Value Ref Range   Sodium 141 135 - 145 mmol/L   Potassium 3.6 3.5 - 5.1 mmol/L   Chloride 104 98 - 111 mmol/L   CO2 31 22 - 32 mmol/L   Glucose, Bld 100 (H) 70 - 99 mg/dL   BUN 12 6 - 20 mg/dL   Creatinine, Ser 09/02/20 0.44 - 1.00 mg/dL   Calcium 8.6 (L) 8.9 - 10.3 mg/dL   Total Protein 6.7 6.5 - 8.1 g/dL   Albumin 2.9 (L) 3.5 - 5.0 g/dL   AST 41 15 - 41 U/L   ALT 134 (H) 0 - 44 U/L   Alkaline Phosphatase 59 38 - 126 U/L   Total Bilirubin 1.0 0.3 - 1.2 mg/dL   GFR, Estimated 1.49 >70 mL/min   Anion gap 6 5 - 15  CBC with Differential/Platelet     Status: Abnormal   Collection Time: 07/03/20  3:56 AM  Result Value Ref Range   WBC 18.5 (H) 4.0 - 10.5 K/uL   RBC 4.38 3.87 - 5.11 MIL/uL   Hemoglobin 10.3 (L) 12.0 - 15.0 g/dL   HCT 09/02/20 37.8 - 58.8 %   MCV  87.2 80.0 - 100.0 fL   MCH 23.5 (L) 26.0 - 34.0 pg   MCHC 27.0 (L) 30.0 - 36.0 g/dL   RDW 76.7 (H) 20.9 - 47.0 %   Platelets 234 150 - 400 K/uL   nRBC 0.0 0.0 - 0.2 %   Neutrophils Relative % 87 %   Neutro Abs 15.9 (H) 1.7 - 7.7 K/uL   Lymphocytes Relative 5 %   Lymphs Abs 0.9 0.7 - 4.0 K/uL   Monocytes Relative 6 %   Monocytes Absolute 1.1 (H) 0.1 - 1.0 K/uL   Eosinophils Relative 1 %   Eosinophils Absolute 0.3 0.0 - 0.5 K/uL   Basophils Relative 0 %   Basophils Absolute 0.1 0.0 - 0.1 K/uL   Immature Granulocytes 1 %   Abs Immature Granulocytes 0.25 (H) 0.00 - 0.07 K/uL  Magnesium     Status: None   Collection Time: 07/03/20  3:56 AM  Result Value Ref Range   Magnesium 1.8 1.7 - 2.4 mg/dL  Glucose, capillary     Status: None   Collection Time: 07/03/20  4:22 AM  Result Value Ref Range   Glucose-Capillary 93 70 - 99 mg/dL  Glucose, capillary     Status: None    Collection Time: 07/03/20  8:08 AM  Result Value Ref Range   Glucose-Capillary 93 70 - 99 mg/dL  Glucose, capillary     Status: Abnormal   Collection Time: 07/03/20 12:03 PM  Result Value Ref Range   Glucose-Capillary 129 (H) 70 - 99 mg/dL    Recent Results (from the past 240 hour(s))  Resp Panel by RT-PCR (Flu A&B, Covid) Nasopharyngeal Swab     Status: None   Collection Time: 06/25/20  3:15 PM   Specimen: Nasopharyngeal Swab; Nasopharyngeal(NP) swabs in vial transport medium  Result Value Ref Range Status   SARS Coronavirus 2 by RT PCR NEGATIVE NEGATIVE Final    Comment: (NOTE) SARS-CoV-2 target nucleic acids are NOT DETECTED.  The SARS-CoV-2 RNA is generally detectable in upper respiratory specimens during the acute phase of infection. The lowest concentration of SARS-CoV-2 viral copies this assay can detect is 138 copies/mL. A negative result does not preclude SARS-Cov-2 infection and should not be used as the sole basis for treatment or other patient management decisions. A negative result may occur with  improper specimen collection/handling, submission of specimen other than nasopharyngeal swab, presence of viral mutation(s) within the areas targeted by this assay, and inadequate number of viral copies(<138 copies/mL). A negative result must be combined with clinical observations, patient history, and epidemiological information. The expected result is Negative.  Fact Sheet for Patients:  BloggerCourse.com  Fact Sheet for Healthcare Providers:  SeriousBroker.it  This test is no t yet approved or cleared by the [place] FDA and  has been authorized for detection and/or diagnosis of SARS-CoV-2 by FDA under an Emergency Use Authorization (EUA). This EUA will remain  in effect (meaning this test can be used) for the duration of the COVID-19 declaration under Section 564(b)(1) of the Act, 21 U.S.C.section  360bbb-3(b)(1), unless the authorization is terminated  or revoked sooner.       Influenza A by PCR NEGATIVE NEGATIVE Final   Influenza B by PCR NEGATIVE NEGATIVE Final    Comment: (NOTE) The Xpert Xpress SARS-CoV-2/FLU/RSV plus assay is intended as an aid in the diagnosis of influenza from Nasopharyngeal swab specimens and should not be used as a sole basis for treatment. Nasal washings and aspirates are unacceptable for Xpert  Xpress SARS-CoV-2/FLU/RSV testing.  Fact Sheet for Patients: BloggerCourse.comhttps://www.fda.gov/media/152166/download  Fact Sheet for Healthcare Providers: SeriousBroker.ithttps://www.fda.gov/media/152162/download  This test is not yet approved or cleared by the [place] FDA and has been authorized for detection and/or diagnosis of SARS-CoV-2 by FDA under an Emergency Use Authorization (EUA). This EUA will remain in effect (meaning this test can be used) for the duration of the COVID-19 declaration under Section 564(b)(1) of the Act, 21 U.S.C. section 360bbb-3(b)(1), unless the authorization is terminated or revoked.  Performed at [facility], [address] [place], [place] [address]   Culture, blood (routine x 2)     Status: Abnormal   Collection Time: 06/25/20  4:45 PM   Specimen: BLOOD  Result Value Ref Range Status   Specimen Description BLOOD FEMORAL ARTERY  Final   Special Requests   Final    BOTTLES DRAWN AEROBIC AND ANAEROBIC Blood Culture results may not be optimal due to an excessive volume of blood received in culture bottles   Culture  Setup Time   Final    GRAM POSITIVE RODS ANAEROBIC BOTTLE ONLY CRITICAL RESULT CALLED TO, READ BACK BY AND VERIFIED WITH: A. PHAM PHARMD, AT [id number] 06/26/20 D. Leighton RoachVANHOOK    Culture (A)  Final    ENTEROCOCCUS FAECIUM STAPHYLOCOCCUS EPIDERMIDIS STAPHYLOCOCCUS SIMULANS THE SIGNIFICANCE OF ISOLATING THIS ORGANISM FROM A SINGLE SET OF BLOOD CULTURES WHEN MULTIPLE SETS ARE DRAWN IS UNCERTAIN. PLEASE NOTIFY THE MICROBIOLOGY  DEPARTMENT WITHIN ONE WEEK IF SPECIATION AND SENSITIVITIES ARE REQUIRED. CLOSTRIDIUM PERFRINGENS Standardized susceptibility testing for this organism is not available. Performed at [facility], [address] [place], [place] [address]    Report Status 06/29/2020 FINAL  Final   Organism ID, Bacteria ENTEROCOCCUS FAECIUM  Final      Susceptibility   Enterococcus faecium - MIC*    AMPICILLIN <=2 SENSITIVE Sensitive     VANCOMYCIN <=0.5 SENSITIVE Sensitive     GENTAMICIN SYNERGY SENSITIVE Sensitive     * ENTEROCOCCUS FAECIUM  MRSA PCR Screening     Status: None   Collection Time: 06/25/20 10:00 PM   Specimen: Nasal Mucosa; Nasopharyngeal  Result Value Ref Range Status   MRSA by PCR NEGATIVE NEGATIVE Final    Comment:        The GeneXpert MRSA Assay (FDA approved for NASAL specimens only), is one component of a comprehensive MRSA colonization surveillance program. It is not intended to diagnose MRSA infection nor to guide or monitor treatment for MRSA infections. Performed at [facility], [address] [place], [place] [address]   Culture, blood (routine x 2)     Status: None   Collection Time: 06/26/20  3:20 AM   Specimen: BLOOD  Result Value Ref Range Status   Specimen Description   Final    BLOOD RIGHT ANTECUBITAL Performed at [facility], [address] [place], [place] [address]    Special Requests   Final    BOTTLES DRAWN AEROBIC ONLY Blood Culture adequate volume Performed at [facility], [address] [place], [place] [address]    Culture   Final    NO GROWTH 5 DAYS Performed at [facility], [address] [place], [place] [address]    Report Status 07/01/2020 FINAL  Final  Culture, Respiratory w Gram Stain     Status: None   Collection Time: 06/26/20  8:19 AM   Specimen: Tracheal Aspirate; Respiratory  Result Value Ref Range Status   Specimen  Description   Final    TRACHEAL  ASPIRATE Performed at [facility], [address] [place], [place] [address]    Special Requests   Final    NONE Performed at [facility], [address] [place], [place] [address]    Gram Stain   Final    ABUNDANT WBC PRESENT,BOTH PMN AND MONONUCLEAR NO ORGANISMS SEEN    Culture   Final    Normal respiratory flora-no Staph aureus or Pseudomonas seen Performed at [facility], [address], [place], [place] [address]    Report Status 06/28/2020 FINAL  Final     Radiology Studies: No results found. CT ANGIO CHEST PE W OR WO CONTRAST  Final Result  Addendum 1 of 1  ADDENDUM REPORT: 06/30/2020 15:17    ADDENDUM:  In addition to findings outlined in the previous report there is  dilation of the main pulmonary artery up to 3.2 cm which may  indicate pulmonary arterial hypertension.    Critical Value/emergent results were called by telephone at the time  of interpretation on 06/30/2020 at 3:16 pm to provider Canary Brim ,  who verbally acknowledged these results.      Electronically Signed    By: Donzetta Kohut M.D.    On: 06/30/2020 15:17      Final    DG Chest Port 1 View  Final Result    DG CHEST PORT 1 VIEW  Final Result    VAS [place] LOWER EXTREMITY VENOUS (DVT)  Final Result    US Abdomen Limited RUQ (LIVER/GB)  Final Result    DG Chest 1 View  Final Result    US RENAL  Final Result    DG CHEST PORT 1 VIEW  Final Result    CT Head Wo Contrast  Final Result    DG Chest Portable 1 View  Final Result      Scheduled Meds: . (feeding supplement) PROSource Plus  30 mL Oral TID BM  . mouth rinse  15 mL Mouth Rinse BID  . multivitamin with minerals  1 tablet Oral Daily  . pantoprazole  40 mg Oral QHS  . Rivaroxaban  15 mg Oral BID WC   Followed by  . [START ON 07/22/2020] rivaroxaban  20 mg Oral Q supper   PRN Meds: docusate sodium, polyethylene glycol Continuous Infusions:   LOS: 8 days  Time  spent: Greater than 50% of the 35 minute visit was spent in counseling/coordination of care for the patient as laid out in the A&P.   Lewie Chamber, MD [facility] 07/03/2020, 3:04 PM

## 2020-07-03 NOTE — TOC Progression Note (Signed)
Transition of Care [facility]) - Progression Note    Patient Details  Name: Ranesha Val MRN: [id number] Date of Birth: [birth year]  [facility] CM/SW Contact  Geni Bers, RN Phone Number: 07/03/2020, 2:22 PM  Clinical Narrative:     A call was made to pt's sister Arline Asp, could not leave VM for her to return call. Will continue to reach out to pt's sister.   Expected Discharge Plan: Homeless Shelter Barriers to Discharge: Continued Medical Work up  Expected Discharge Plan and Services Expected Discharge Plan: Homeless Shelter       Living arrangements for the past 2 months: Hotel/Motel                                       Social Determinants of Health (SDOH) Interventions    Readmission Risk Interventions No flowsheet data found.

## 2020-07-03 NOTE — Progress Notes (Signed)
Occupational Therapy Treatment Patient Details Name: Felicia Warren MRN: [id number] DOB: 12/28/1970 Today's Date: 07/03/2020    History of present illness Patient is a 50 year old female admitted with hypoglycemia and AMS, needing intubation on 5/4 and extubated on 5/8. Unsure of past medical history, patient currently living in [place] plans to move to [place] to be closer to family.   OT comments  Patient supine in bed and agreeable to therapy when therapist entered the room. Patient able to follow commands and answer questions mostly appropriately but still mentally altered. Patient is unable to be detailed with answers, some conversation is rambling and not appropriate to question. She is mostly oriented to time and self but has no understanding of hospital coarse and no understanding of her physical deficits. Today patient min assist to transfer to side of bed using bed rails and mod-max assist x 2 to stand with RW for pericare after patient found to be incontinent of BM. patient able to stand with walker - predominantly heavily leaning over walker - for pericare and managing bed linens. Patient sat at edge of bed for grooming, bathing and donning of new hospital gown. Patient wanting to stay in seated position but chair in room unable to accommodate her size. On return to bed patient able to roll left and right for further pericare and bed linen management with min assist from therapist. Patient family member present and reports she had been independent in [place]. Reports she's been kind of "off" since losing her job but near as confused as she presents today. Will continue to work with patient to progress her towards her goals.   Follow Up Recommendations  SNF    Equipment Recommendations  Other (comment) (TBD)    Recommendations for Other Services      Precautions / Restrictions Precautions Precautions: Fall Precaution Comments: bariatric Restrictions Weight Bearing Restrictions:  No       Mobility Bed Mobility Overal bed mobility: Needs Assistance Bed Mobility: Rolling;Supine to Sit;Sit to Supine Rolling: Min assist   Supine to sit: Min assist;HOB elevated Sit to supine: Mod assist   General bed mobility comments: Min assist to roll left and right - neeing assistance to get further over in order to manage bed pads. Min assist for left lower leg to transfer to side of bed otherwise patient able to use bed rails. Mod assist to return to supine for lower legs.    Transfers Overall transfer level: Needs assistance Equipment used: Rolling walker (2 wheeled) Transfers: Sit to/from Stand Sit to Stand: Mod assist;Max assist;+2 physical assistance;+2 safety/equipment;From elevated surface         General transfer comment: Stood x 3 with RW and assistance of 2 at side of bed. Initially Mod x 2 and advanced to Max x 2 on third sit to stand. Able to maintain standing position > than 1 minute for each stand for bed linen change on hospital bed and pericare    Balance Overall balance assessment: Needs assistance Sitting-balance support: Feet supported Sitting balance-Leahy Scale: Fair     Standing balance support: During functional activity Standing balance-Leahy Scale: Poor Standing balance comment: reliant on walker                           ADL either performed or assessed with clinical judgement   ADL Overall ADL's : Needs assistance/impaired Eating/Feeding: Set up   Grooming: Set up;Sitting;Wash/dry face Grooming Details (indicate cue type and reason): washed  face sitting at side of bed     Lower Body Bathing: Sit to/from stand;Maximal assistance;+2 for physical assistance;+2 for safety/equipment Lower Body Bathing Details (indicate cue type and reason): +2 assistance (mod each person)  to stand at side of bed x 2 for lower body bathing and peri care after patient found to be incontinent of BM. Max assist for bathing task - needing assistance  for buttocks, back of legs, inner thighs and lower legs. Patient able to clean periarea seated and leaning at side of bed. Upper Body Dressing : Minimal assistance;Sitting Upper Body Dressing Details (indicate cue type and reason): donned new gown - needing assistance due to abnormal clothing. Lower Body Dressing: Total assistance;Sit to/from stand Lower Body Dressing Details (indicate cue type and reason): to donn socks     Toileting- Clothing Manipulation and Hygiene: +2 for physical assistance;+2 for safety/equipment;Total assistance;Sit to/from stand Toileting - Clothing Manipulation Details (indicate cue type and reason): for pericare after oncontinent of BM             Vision Patient Visual Report: No change from baseline     Perception     Praxis      Cognition Arousal/Alertness: Awake/alert Behavior During Therapy: WFL for tasks assessed/performed Overall Cognitive Status: Impaired/Different from baseline Area of Impairment: Orientation;Attention;Memory;Safety/judgement;Following commands;Awareness;Problem solving                 Orientation Level: Disoriented to;Situation (patient nows the year and month. off on the date. Knows the President and that she is in the hospital.) Current Attention Level: Selective Memory: Decreased short-term memory Following Commands: Follows one step commands consistently Safety/Judgement: Decreased awareness of deficits Awareness: Emergent Problem Solving: Requires verbal cues General Comments: Patient is alert to self month and year. Not to situation. Is able to follow commands. Patient does not give specific details with questioning. Mentation still altered. Some things she says is correct other things are not. 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 Has no insight into her deficits.        Exercises     Shoulder Instructions       General Comments      Pertinent Vitals/ Pain        Pain Assessment: No/denies pain  Home Living                                          Prior Functioning/Environment              Frequency  Min 2X/week        Progress Toward Goals  OT Goals(current goals can now be found in the care plan section)  Progress towards OT goals: Progressing toward goals  Acute Rehab OT Goals Patient Stated Goal: to get to a chair OT Goal Formulation: With patient Time For Goal Achievement: 07/15/20 Potential to Achieve Goals: Good  Plan Discharge plan remains appropriate    Co-evaluation          OT goals addressed during session: ADL's and self-care (functional mobility)      AM-PAC OT "6 Clicks" Daily Activity     Outcome Measure   Help from another person eating meals?: A Little Help from another person taking care of personal grooming?: A Little Help from another person toileting, which includes using toliet, bedpan, or urinal?: A Lot Help from another  person bathing (including washing, rinsing, drying)?: A Lot Help from another person to put on and taking off regular upper body clothing?: A Little Help from another person to put on and taking off regular lower body clothing?: Total 6 Click Score: 14    End of Session Equipment Utilized During Treatment: Rolling walker;Oxygen  OT Visit Diagnosis: Other abnormalities of gait and mobility (R26.89);Muscle weakness (generalized) (M62.81);Other symptoms and signs involving cognitive function   Activity Tolerance Patient tolerated treatment well   Patient Left in bed;with call bell/phone within reach;with bed alarm set   Nurse Communication Mobility status        Time: 1505-6979 OT Time Calculation (min): 52 min  Charges: OT General Charges $OT Visit: 1 Visit OT Treatments $Self Care/Home Management : 23-37 mins $Therapeutic Activity: 8-22 mins  Rochele Lueck, OTR/L Acute Care Rehab Services  Office [phone] Pager: [phone]    Kelli Churn 07/03/2020, 5:12 PM

## 2020-07-03 NOTE — Progress Notes (Signed)
14 beats of vtach noted per telemetry at 1007 am.  EKG obtained, noted to be sinus tach on the monitor in lower 100's, alert and oriented, denying any chest pain. RR elevated in the upper 20s-40's. Patient not in any distress. O2 saturation is 95% on 4L of nasal cannula. Made MD Girguis aware, with no new orders . Will continue to monitor.

## 2020-07-04 ENCOUNTER — Inpatient Hospital Stay (HOSPITAL_COMMUNITY): Payer: Self-pay

## 2020-07-04 LAB — BLOOD GAS, ARTERIAL
Acid-Base Excess: 5.1 mmol/L — ABNORMAL HIGH (ref 0.0–2.0)
Bicarbonate: 31.7 mmol/L — ABNORMAL HIGH (ref 20.0–28.0)
O2 Saturation: 93.1 %
Patient temperature: 98.7
pCO2 arterial: 61 mmHg — ABNORMAL HIGH (ref 32.0–48.0)
pH, Arterial: 7.336 — ABNORMAL LOW (ref 7.350–7.450)
pO2, Arterial: 72.6 mmHg — ABNORMAL LOW (ref 83.0–108.0)

## 2020-07-04 LAB — COMPREHENSIVE METABOLIC PANEL
ALT: 105 U/L — ABNORMAL HIGH (ref 0–44)
AST: 38 U/L (ref 15–41)
Albumin: 2.7 g/dL — ABNORMAL LOW (ref 3.5–5.0)
Alkaline Phosphatase: 67 U/L (ref 38–126)
Anion gap: 6 (ref 5–15)
BUN: 9 mg/dL (ref 6–20)
CO2: 33 mmol/L — ABNORMAL HIGH (ref 22–32)
Calcium: 8.5 mg/dL — ABNORMAL LOW (ref 8.9–10.3)
Chloride: 103 mmol/L (ref 98–111)
Creatinine, Ser: 0.73 mg/dL (ref 0.44–1.00)
GFR, Estimated: >60 mL/min (ref >60)
Glucose, Bld: 99 mg/dL (ref 70–99)
Potassium: 3.9 mmol/L (ref 3.5–5.1)
Sodium: 142 mmol/L (ref 135–145)
Total Bilirubin: 1 mg/dL (ref 0.3–1.2)
Total Protein: 6.6 g/dL (ref 6.5–8.1)

## 2020-07-04 LAB — GLUCOSE, CAPILLARY
Glucose-Capillary: 104 mg/dL — ABNORMAL HIGH (ref 70–99)
Glucose-Capillary: 111 mg/dL — ABNORMAL HIGH (ref 70–99)
Glucose-Capillary: 115 mg/dL — ABNORMAL HIGH (ref 70–99)
Glucose-Capillary: 125 mg/dL — ABNORMAL HIGH (ref 70–99)
Glucose-Capillary: 127 mg/dL — ABNORMAL HIGH (ref 70–99)
Glucose-Capillary: 93 mg/dL (ref 70–99)
Glucose-Capillary: 98 mg/dL (ref 70–99)

## 2020-07-04 LAB — CBC WITH DIFFERENTIAL/PLATELET
Abs Immature Granulocytes: 0.14 10*3/uL — ABNORMAL HIGH (ref 0.00–0.07)
Basophils Absolute: 0.1 10*3/uL (ref 0.0–0.1)
Basophils Relative: 0 %
Eosinophils Absolute: 0.3 10*3/uL (ref 0.0–0.5)
Eosinophils Relative: 2 %
HCT: 39.1 % (ref 36.0–46.0)
Hemoglobin: 10.8 g/dL — ABNORMAL LOW (ref 12.0–15.0)
Immature Granulocytes: 1 %
Lymphocytes Relative: 8 %
Lymphs Abs: 1.1 10*3/uL (ref 0.7–4.0)
MCH: 24.2 pg — ABNORMAL LOW (ref 26.0–34.0)
MCHC: 27.6 g/dL — ABNORMAL LOW (ref 30.0–36.0)
MCV: 87.5 fL (ref 80.0–100.0)
Monocytes Absolute: 0.9 10*3/uL (ref 0.1–1.0)
Monocytes Relative: 6 %
Neutro Abs: 11.8 10*3/uL — ABNORMAL HIGH (ref 1.7–7.7)
Neutrophils Relative %: 83 %
Platelets: 245 10*3/uL (ref 150–400)
RBC: 4.47 MIL/uL (ref 3.87–5.11)
RDW: 18.1 % — ABNORMAL HIGH (ref 11.5–15.5)
WBC: 14.3 10*3/uL — ABNORMAL HIGH (ref 4.0–10.5)
nRBC: 0 % (ref 0.0–0.2)

## 2020-07-04 LAB — BRAIN NATRIURETIC PEPTIDE: B Natriuretic Peptide: 472.5 pg/mL — ABNORMAL HIGH (ref 0.0–100.0)

## 2020-07-04 LAB — MAGNESIUM: Magnesium: 1.8 mg/dL (ref 1.7–2.4)

## 2020-07-04 MED ORDER — FUROSEMIDE 10 MG/ML IJ SOLN
20.0000 mg | Freq: Once | INTRAMUSCULAR | Status: AC
  Administered 2020-07-04: 20 mg via INTRAVENOUS
  Filled 2020-07-04: qty 2

## 2020-07-04 MED ORDER — IPRATROPIUM-ALBUTEROL 0.5-2.5 (3) MG/3ML IN SOLN
3.0000 mL | Freq: Four times a day (QID) | RESPIRATORY_TRACT | Status: DC
  Administered 2020-07-04 – 2020-07-05 (×4): 3 mL via RESPIRATORY_TRACT
  Filled 2020-07-04 (×4): qty 3

## 2020-07-04 MED ORDER — FUROSEMIDE 10 MG/ML IJ SOLN
40.0000 mg | Freq: Two times a day (BID) | INTRAMUSCULAR | Status: DC
  Administered 2020-07-04 – 2020-07-14 (×21): 40 mg via INTRAVENOUS
  Filled 2020-07-04 (×21): qty 4

## 2020-07-04 MED ORDER — ALBUTEROL SULFATE (2.5 MG/3ML) 0.083% IN NEBU
2.5000 mg | INHALATION_SOLUTION | Freq: Once | RESPIRATORY_TRACT | Status: AC | PRN
  Administered 2020-07-04: 2.5 mg via RESPIRATORY_TRACT
  Filled 2020-07-04: qty 3

## 2020-07-04 NOTE — Progress Notes (Addendum)
Rapid Response Event Note   Reason for Call :  Red MEWS and increased WOB  Initial Focused Assessment:  Pt alert and able to answer questions. Pt is wearing CPAP mask. WOB notably increase - accessory muscles used. Lungs sound diminished on the R and a mix between rhonchi and expiratory wheezing on L. Pt states she occasionally coughed up mucous during the day.  Interventions:  BS 104, temp 98.7, BP 123/62 (77), HR 100, RR 40, 97% on CPAP  Plan of Care:  EKG, Blood gas, CXR, and Albuterol nebulizer ordered. RT made aware. 20mg  lasix given  Art blood gas pH 7.336, values show she's compensating but CO2, O2, and Bicarb all abnormal. CXR shows infiltrates but waiting on the official reading from MD  Event Summary:  Pt is okay to stay OTF. Floor RN will continue to monitor WOB. Rapid RN will continue to monitor remotely, report will be given to day shift rapid RN   MD Notified: (triad on call) @ 0440a Call Time: 0425a Arrival Time: 0435a End Time: 0450a  Delma Post, RN

## 2020-07-04 NOTE — Progress Notes (Signed)
Progress Note    [name]   EVO:[id number]  DOB: 06/18/1970  DOA: 06/25/2020     9  PCP: Pcp, No  CC: AMS  Hospital Course: 50 yo F who presented initially as a Felicia Warren, found with altered mental status in a motel by staff. It is unknown when her last known well was. EMS found patient awake, but altered with aglucoseof40. This was corrected, but she continued to have worsening mental status. Narcan did not provide any response. In ED she required nonrebreather mask and eventual intubation for hypoxic respiratory failure. Chest x-ray with probable multifocal pneumonia. AKI, elevated INR on admit. No prior history in epic as she is from [place] and no known emergency contact. Admitted by PCCM given requirement for airway protection/intubation.   5/4 presented to ED with hypoglycemia and AMS, required intubation, PCCM consult. CXR concerning for PNA. Unasyn initiated. Tx to [facility] for beds. UDS neg, neg tylenol/ salicylates. CTH neg. Lactic acid 5.9->4.9  5/5 Hypotensive, responded to IVF + albumin, on heparin gtt but stopped due to platelet decline, not on pressors, requiring PEEP 10 / FiO2 80. RUQ [place] with cholelithiasis but no cholecystitis.   5/6 Vent needs decreased. LE venous duplex negative.   5/7 diarrhea > Placed flexiseal 5/8  5/8 WUA/SBT, extubated   Interval History:  Rapid response overnight due to increased work of breathing.  She was noted to be using some accessory muscles.  CPAP mask was on.  ABG and CXR was obtained.  She was found to be in some volume overload and was administered Lasix and breathing treatments.  ROS: Constitutional: negative for chills and fevers, Respiratory: negative for cough, Cardiovascular: negative for chest pain and Gastrointestinal: negative for abdominal pain  Assessment & Plan: Acute Metabolic Encephalopathy secondary to Hypoglycemia & Hypercapnic Respiratory Failure Super(Morbid) Obesity - BMI 88 - Obesity hypoventilation  syndrome extremely likely given hypercarbia and elevated bicarbonate despite respiratory recovery.  - CT head, UDS, and ammonia unremarkable. - Mental status resolving with supportive care - now off ventilator >48h  - Minimize sedating medications  - Nocturnal BiPAP  Acute Hypercarbic / Hypoxemic Respiratory Failure requiring IPPV, POA Suspected initially secondary to CAP/Aspiration with concurrent OHS (BMI 88), POA - Completed abx course 06/30/20 - Strep/legionella urinay antigens negative.  - Blood cultures likely contaminant given only1 of2 from May 4th growing muliple species  - Repeat cultures 5/5 remain negative - Will need outpatient sleep study> she lives in [place] likely need referral from PCP -Remains dependent on oxygen.  Currently 3 to 4 L.  If unable to wean off prior to discharge, will require home O2 which may be another barrier - CXR on 5/12 shows edema; continue lasix  Suspected LLL segmental PE, Likely POA D Dimer elevation - Greater than 20 on 5/6 - LLL segmental PE suspected on imaging - Transition to xarelto - discussed with pharmacy  AKI, undetermined acuity/unkown baseline, resolved AGMA/lactic acidosis, resolved Hypokalemia, resolved - Trend BMP / urinary output - Avoid nephrotoxic agents  Elevated Troponin\Likely supply demand mismatch in the setting of profound hypoxia Elevated D Dimer  McConnell's Sign - Troponin downtrending with oxygenation - Echo = LVEF 55-60%, no WMA, McConnell's sign with moderately enlarged RV, interventricular septal flattening concerning for RV overload (likely due to OHS as above) - Suspected LLL segmental PE as above - on xarelto  Elevated liver enzymes - Cannot rule out shock liver given profound hypoxia - RUQ [place] negative. Acute hepatitis panel negative. - Labs downtrending appropriately  Hypoglycemia -resolved - Glucose in 40's on admit, no available hx - Resolved with dextrose infusion  Diarrhea,  resolving - Likely in setting of TF + bowel regimen  -PRN colace, miralax  Anemia, normocytic - likely chronic anemia of chronic disease given above  -trend CBC  -transfuse for Hgb <7%  Old records reviewed in assessment of this patient   DVT prophylaxis: Place and maintain sequential compression device Start: 06/26/20 1150 SCDs Start: 06/25/20 1828 Rivaroxaban (XARELTO) tablet 15 mg  rivaroxaban (XARELTO) tablet 20 mg   Code Status:   Code Status: Full Code Family Communication:   Disposition Plan: Status is: Inpatient  Remains inpatient appropriate because:Unsafe d/c plan and Inpatient level of care appropriate due to severity of illness   Dispo: The patient is from: Home              Anticipated d/c is to: Pending safe dispo              Patient currently is not medically stable to d/c.   Difficult to place patient No  Risk of unplanned readmission score: Unplanned Admission- Pilot do not use: 13.78   Objective: Blood pressure (!) 100/58, pulse (!) 104, temperature 98.6 F (37 C), temperature source Oral, resp. rate (!) 29, height 5\' 6"  (1.676 m), weight (!) 244.9 kg, SpO2 94 %.  Examination: General appearance: alert, cooperative, fatigued and no distress Head: Normocephalic, without obvious abnormality, atraumatic Eyes: EOMI Lungs: clear to auscultation bilaterally Heart: regular rate and rhythm and S1, S2 normal Abdomen: normal findings: bowel sounds normal and soft, non-tender Extremities: Obese Skin: mobility and turgor normal Neurologic: Grossly normal  Consultants:     Procedures:     Data Reviewed: I have personally reviewed following labs and imaging studies Results for orders placed or performed during the hospital encounter of 06/25/20 (from the past 24 hour(s))  Glucose, capillary     Status: Abnormal   Collection Time: 07/03/20  5:25 PM  Result Value Ref Range   Glucose-Capillary 104 (H) 70 - 99 mg/dL  Glucose, capillary     Status:  Abnormal   Collection Time: 07/03/20  8:44 PM  Result Value Ref Range   Glucose-Capillary 103 (H) 70 - 99 mg/dL  Glucose, capillary     Status: None   Collection Time: 07/04/20 12:11 AM  Result Value Ref Range   Glucose-Capillary 93 70 - 99 mg/dL  Comprehensive metabolic panel     Status: Abnormal   Collection Time: 07/04/20  4:24 AM  Result Value Ref Range   Sodium 142 135 - 145 mmol/L   Potassium 3.9 3.5 - 5.1 mmol/L   Chloride 103 98 - 111 mmol/L   CO2 33 (H) 22 - 32 mmol/L   Glucose, Bld 99 70 - 99 mg/dL   BUN 9 6 - 20 mg/dL   Creatinine, Ser 07/06/20 0.44 - 1.00 mg/dL   Calcium 8.5 (L) 8.9 - 10.3 mg/dL   Total Protein 6.6 6.5 - 8.1 g/dL   Albumin 2.7 (L) 3.5 - 5.0 g/dL   AST 38 15 - 41 U/L   ALT 105 (H) 0 - 44 U/L   Alkaline Phosphatase 67 38 - 126 U/L   Total Bilirubin 1.0 0.3 - 1.2 mg/dL   GFR, Estimated 4.97 >02 mL/min   Anion gap 6 5 - 15  CBC with Differential/Platelet     Status: Abnormal   Collection Time: 07/04/20  4:24 AM  Result Value Ref Range   WBC 14.3 (H) 4.0 -  10.5 K/uL   RBC 4.47 3.87 - 5.11 MIL/uL   Hemoglobin 10.8 (L) 12.0 - 15.0 g/dL   HCT 09.8 11.9 - 14.7 %   MCV 87.5 80.0 - 100.0 fL   MCH 24.2 (L) 26.0 - 34.0 pg   MCHC 27.6 (L) 30.0 - 36.0 g/dL   RDW 82.9 (H) 56.2 - 13.0 %   Platelets 245 150 - 400 K/uL   nRBC 0.0 0.0 - 0.2 %   Neutrophils Relative % 83 %   Neutro Abs 11.8 (H) 1.7 - 7.7 K/uL   Lymphocytes Relative 8 %   Lymphs Abs 1.1 0.7 - 4.0 K/uL   Monocytes Relative 6 %   Monocytes Absolute 0.9 0.1 - 1.0 K/uL   Eosinophils Relative 2 %   Eosinophils Absolute 0.3 0.0 - 0.5 K/uL   Basophils Relative 0 %   Basophils Absolute 0.1 0.0 - 0.1 K/uL   Immature Granulocytes 1 %   Abs Immature Granulocytes 0.14 (H) 0.00 - 0.07 K/uL  Magnesium     Status: None   Collection Time: 07/04/20  4:24 AM  Result Value Ref Range   Magnesium 1.8 1.7 - 2.4 mg/dL  Brain natriuretic peptide     Status: Abnormal   Collection Time: 07/04/20  4:24 AM  Result  Value Ref Range   B Natriuretic Peptide 472.5 (H) 0.0 - 100.0 pg/mL  Glucose, capillary     Status: Abnormal   Collection Time: 07/04/20  4:34 AM  Result Value Ref Range   Glucose-Capillary 104 (H) 70 - 99 mg/dL  Blood gas, arterial     Status: Abnormal   Collection Time: 07/04/20  4:55 AM  Result Value Ref Range   pH, Arterial 7.336 (L) 7.350 - 7.450   pCO2 arterial 61.0 (H) 32.0 - 48.0 mmHg   pO2, Arterial 72.6 (L) 83.0 - 108.0 mmHg   Bicarbonate 31.7 (H) 20.0 - 28.0 mmol/L   Acid-Base Excess 5.1 (H) 0.0 - 2.0 mmol/L   O2 Saturation 93.1 %   Patient temperature 98.7    Allens test (pass/fail) PASS PASS  Glucose, capillary     Status: Abnormal   Collection Time: 07/04/20  9:10 AM  Result Value Ref Range   Glucose-Capillary 125 (H) 70 - 99 mg/dL  Glucose, capillary     Status: Abnormal   Collection Time: 07/04/20 11:59 AM  Result Value Ref Range   Glucose-Capillary 115 (H) 70 - 99 mg/dL    Recent Results (from the past 240 hour(s))  Resp Panel by RT-PCR (Flu A&B, Covid) Nasopharyngeal Swab     Status: None   Collection Time: 06/25/20  3:15 PM   Specimen: Nasopharyngeal Swab; Nasopharyngeal(NP) swabs in vial transport medium  Result Value Ref Range Status   SARS Coronavirus 2 by RT PCR NEGATIVE NEGATIVE Final    Comment: (NOTE) SARS-CoV-2 target nucleic acids are NOT DETECTED.  The SARS-CoV-2 RNA is generally detectable in upper respiratory specimens during the acute phase of infection. The lowest concentration of SARS-CoV-2 viral copies this assay can detect is 138 copies/mL. A negative result does not preclude SARS-Cov-2 infection and should not be used as the sole basis for treatment or other patient management decisions. A negative result may occur with  improper specimen collection/handling, submission of specimen other than nasopharyngeal swab, presence of viral mutation(s) within the areas targeted by this assay, and inadequate number of viral copies(<138 copies/mL).  A negative result must be combined with clinical observations, patient history, and epidemiological information. The expected result  is Negative.  Fact Sheet for Patients:  BloggerCourse.com  Fact Sheet for Healthcare Providers:  SeriousBroker.it  This test is no t yet approved or cleared by the [place] FDA and  has been authorized for detection and/or diagnosis of SARS-CoV-2 by FDA under an Emergency Use Authorization (EUA). This EUA will remain  in effect (meaning this test can be used) for the duration of the COVID-19 declaration under Section 564(b)(1) of the Act, 21 U.S.C.section 360bbb-3(b)(1), unless the authorization is terminated  or revoked sooner.       Influenza A by PCR NEGATIVE NEGATIVE Final   Influenza B by PCR NEGATIVE NEGATIVE Final    Comment: (NOTE) The Xpert Xpress SARS-CoV-2/FLU/RSV plus assay is intended as an aid in the diagnosis of influenza from Nasopharyngeal swab specimens and should not be used as a sole basis for treatment. Nasal washings and aspirates are unacceptable for Xpert Xpress SARS-CoV-2/FLU/RSV testing.  Fact Sheet for Patients: BloggerCourse.com  Fact Sheet for Healthcare Providers: SeriousBroker.it  This test is not yet approved or cleared by the [place] FDA and has been authorized for detection and/or diagnosis of SARS-CoV-2 by FDA under an Emergency Use Authorization (EUA). This EUA will remain in effect (meaning this test can be used) for the duration of the COVID-19 declaration under Section 564(b)(1) of the Act, 21 U.S.C. section 360bbb-3(b)(1), unless the authorization is terminated or revoked.  Performed at [facility], [address], [place], [place] [address]   Culture, blood (routine x 2)     Status: Abnormal   Collection Time: 06/25/20  4:45 PM   Specimen: BLOOD  Result Value Ref Range Status    Specimen Description BLOOD FEMORAL ARTERY  Final   Special Requests   Final    BOTTLES DRAWN AEROBIC AND ANAEROBIC Blood Culture results may not be optimal due to an excessive volume of blood received in culture bottles   Culture  Setup Time   Final    GRAM POSITIVE RODS ANAEROBIC BOTTLE ONLY CRITICAL RESULT CALLED TO, READ BACK BY AND VERIFIED WITH: A. PHAM PHARMD, AT 1914 06/26/20 D. Leighton Roach    Culture (A)  Final    ENTEROCOCCUS FAECIUM STAPHYLOCOCCUS EPIDERMIDIS STAPHYLOCOCCUS SIMULANS THE SIGNIFICANCE OF ISOLATING THIS ORGANISM FROM A SINGLE SET OF BLOOD CULTURES WHEN MULTIPLE SETS ARE DRAWN IS UNCERTAIN. PLEASE NOTIFY THE MICROBIOLOGY DEPARTMENT WITHIN ONE WEEK IF SPECIATION AND SENSITIVITIES ARE REQUIRED. CLOSTRIDIUM PERFRINGENS Standardized susceptibility testing for this organism is not available. Performed at [facility], [address] [place], [place] [address]    Report Status 06/29/2020 FINAL  Final   Organism ID, Bacteria ENTEROCOCCUS FAECIUM  Final      Susceptibility   Enterococcus faecium - MIC*    AMPICILLIN <=2 SENSITIVE Sensitive     VANCOMYCIN <=0.5 SENSITIVE Sensitive     GENTAMICIN SYNERGY SENSITIVE Sensitive     * ENTEROCOCCUS FAECIUM  MRSA PCR Screening     Status: None   Collection Time: 06/25/20 10:00 PM   Specimen: Nasal Mucosa; Nasopharyngeal  Result Value Ref Range Status   MRSA by PCR NEGATIVE NEGATIVE Final    Comment:        The GeneXpert MRSA Assay (FDA approved for NASAL specimens only), is one component of a comprehensive MRSA colonization surveillance program. It is not intended to diagnose MRSA infection nor to guide or monitor treatment for MRSA infections. Performed at [facility], [address] [place], [place] [address]   Culture, blood (routine  x 2)     Status: None   Collection Time: 06/26/20  3:20 AM   Specimen: BLOOD  Result Value Ref Range Status   Specimen Description   Final    BLOOD RIGHT  ANTECUBITAL Performed at [facility], [address] [place], [place] [address]    Special Requests   Final    BOTTLES DRAWN AEROBIC ONLY Blood Culture adequate volume Performed at [facility], [address] [place], [place] [address]    Culture   Final    NO GROWTH 5 DAYS Performed at [facility], [address] [place], [place] [address]    Report Status 07/01/2020 FINAL  Final  Culture, Respiratory w Gram Stain     Status: None   Collection Time: 06/26/20  8:19 AM   Specimen: Tracheal Aspirate; Respiratory  Result Value Ref Range Status   Specimen Description   Final    TRACHEAL ASPIRATE Performed at [facility], [address] [place], [place] [address]    Special Requests   Final    NONE Performed at [facility], [address] [place], [place] [address]    Gram Stain   Final    ABUNDANT WBC PRESENT,BOTH PMN AND MONONUCLEAR NO ORGANISMS SEEN    Culture   Final    Normal respiratory flora-no Staph aureus or Pseudomonas seen Performed at [facility], [address] [place], [place] [address]    Report Status 06/28/2020 FINAL  Final     Radiology Studies: DG Chest Port 1 View  Result Date: 07/04/2020 CLINICAL DATA:  Respiratory distress EXAM: PORTABLE CHEST 1 VIEW COMPARISON:  Four days ago FINDINGS: Cardiomegaly and vascular pedicle widening. Diffuse vascular prominence with cephalized blood flow and possible edema. There could be small volume pleural fluid on the left. More focal opacity in the right mid lung where there was atelectasis on a recent CT. Dense retrocardiac density. IMPRESSION: 1. Generalized worsening aeration compared to 4 days ago. 2. Cardiomegaly and vascular congestion with pulmonary opacities described on recent chest CT. Electronically Signed   By: Marnee SpringJonathon  Watts M.D.   On: 07/04/2020 05:30   DG Chest Port 1 View  Final Result    CT ANGIO CHEST PE W  OR WO CONTRAST  Final Result  Addendum 1 of 1  ADDENDUM REPORT: 06/30/2020 15:17    ADDENDUM:  In addition to findings outlined in the previous report there is  dilation of the main pulmonary artery up to 3.2 cm which may  indicate pulmonary arterial hypertension.    Critical Value/emergent results were called by telephone at the time  of interpretation on 06/30/2020 at 3:16 pm to provider Canary BrimBRANDI OLLIS ,  who verbally acknowledged these results.      Electronically Signed    By: Donzetta KohutGeoffrey  Wile M.D.    On: 06/30/2020 15:17      Final    DG Chest Port 1 View  Final Result    DG CHEST PORT 1 VIEW  Final Result    VAS US LOWER EXTREMITY VENOUS (DVT)  Final Result    US Abdomen Limited RUQ (LIVER/GB)  Final Result    DG Chest 1 View  Final Result    US RENAL  Final Result    DG CHEST PORT 1 VIEW  Final Result    CT Head Wo Contrast  Final Result    DG Chest Portable 1 View  Final Result  Scheduled Meds: . (feeding supplement) PROSource Plus  30 mL Oral TID BM  . furosemide  40 mg Intravenous BID  . ipratropium-albuterol  3 mL Nebulization Q6H  . mouth rinse  15 mL Mouth Rinse BID  . multivitamin with minerals  1 tablet Oral Daily  . pantoprazole  40 mg Oral QHS  . Rivaroxaban  15 mg Oral BID WC   Followed by  . [START ON 07/22/2020] rivaroxaban  20 mg Oral Q supper   PRN Meds: docusate sodium, polyethylene glycol Continuous Infusions:   LOS: 9 days  Time spent: Greater than 50% of the 35 minute visit was spent in counseling/coordination of care for the patient as laid out in the A&P.   Lewie Chamber, MD [facility] 07/04/2020, 1:06 PM

## 2020-07-04 NOTE — Progress Notes (Signed)
Received a call from rapid response RN Erin regarding the patient being tachypneic with RR in the 40s.  Per RN the patient was diffusely wheezing on auscultation.  The writer is working remotely and could not personally assess the patient.  Chart reviewed.  Admission BNP was elevated, repeating BNP x 1 to see the trend.  Net I&O +9.3 L since admit.  Ordered IV Lasix 20 mg x 1 and DuoNebs.  Recommend pulmonary follow-up outpatient for possible PFT and polysomnography.

## 2020-07-04 NOTE — Progress Notes (Signed)
Occupational Therapy Treatment Patient Details Name: Felicia Warren MRN: [id number] DOB: 1970/03/09 Today's Date: 07/04/2020    History of present illness Patient is a 50 year old female admitted with hypoglycemia and AMS, needing intubation on 5/4 and extubated on 5/8. Unsure of past medical history, patient currently living in [place] plans to move to [place] to be closer to family.   OT comments  Pt progressing towards acute OT goals. Today, pt able to sit EOB several minutes, stood 2x from EOB. Able to static standing holding onto the back of the recliner positioned in front of her for about 3 minutes. On second stand pt able to side step about 1' (slides feet to advance). D/c plan noted to be complicated but recommendations remain appropriate from OT standpoint.    Follow Up Recommendations  SNF (difficult placement. Possible that she could progress to home d/c depending on progress while she's here.)    Equipment Recommendations  Other (comment) (TBD)    Recommendations for Other Services      Precautions / Restrictions Precautions Precautions: Fall Precaution Comments: bariatric       Mobility Bed Mobility Overal bed mobility: Needs Assistance Bed Mobility: Rolling;Supine to Sit;Sit to Supine Rolling: Min assist   Supine to sit: Min assist;+2 for safety/equipment Sit to supine: Min assist;+2 for safety/equipment   General bed mobility comments: Cueing for use of rails and transfer techniques.  For moving up in bed requiring feet blocked, use of UE and assist with bed pad.  Requiring assist with legs with transfers and HHA to lift trunk.    Transfers Overall transfer level: Needs assistance Equipment used:  (recliner) Transfers: Sit to/from Stand Sit to Stand: Min assist;+2 safety/equipment         General transfer comment: Min A of 2 for safety; sit to stand x 2 with increased time to rise; utilized back of recliner for support.    Balance Overall balance  assessment: Needs assistance Sitting-balance support: Feet supported;No upper extremity supported Sitting balance-Leahy Scale: Fair Sitting balance - Comments: Sat EOB at least 20 mins during session.  At one point putting L leg up on bed to stretch and pericare   Standing balance support: During functional activity;Bilateral upper extremity supported Standing balance-Leahy Scale: Poor Standing balance comment: Required holding back of recliner but standing without assist.  Performed 2 x for ~2-3 mins                           ADL either performed or assessed with clinical judgement   ADL Overall ADL's : Needs assistance/impaired                             Toileting- Clothing Manipulation and Hygiene: Total assistance;Sitting/lateral lean Toileting - Clothing Manipulation Details (indicate cue type and reason): for anterior pericare sitting EOB. Pt able to prop LLE onto bed to provide access, total A for perciare. Sat at min guard level.       General ADL Comments: bed mobility, sat EOB a few minutes then stood 2x from EOB. First stand she was able to maintain for about 3 minutes using the back of the recliner placed in front of her for support. On second stand pt able to side step about 1' (slides feet to advance).     Vision       Ambulance person  Arousal/Alertness: Awake/alert Behavior During Therapy: WFL for tasks assessed/performed Overall Cognitive Status: No family/caregiver present to determine baseline cognitive functioning                   Orientation Level: Disoriented to;Time Current Attention Level: Selective Memory: Decreased short-term memory Following Commands: Follows one step commands consistently Safety/Judgement: Decreased awareness of deficits Awareness: Emergent Problem Solving: Requires verbal cues General Comments: often with tangential/random responses. answers basic questions appropriately but no  elaboration. Internally distracted? A&O to person, place, month, and year. Decreased insight into deficits. Decreased problem solving. Mentation appears to still be altered.        Exercises General Exercises - Lower Extremity Ankle Circles/Pumps: AROM;Both;10 reps;Supine Heel Slides: AROM;Both;5 reps;Supine   Shoulder Instructions       General Comments Pt had rapid response earlier today but RN reports improved and safe to work with therapy. She was on 6 L O2 with sats 87-93% rest and sitting/standing,would briefly drop to 81-83% with supine/sit transfers.  Pt would have preferred stay up in chair but called portables and all bariatric chairs in use.    Pertinent Vitals/ Pain       Pain Assessment: No/denies pain  Home Living                                          Prior Functioning/Environment              Frequency  Min 2X/week        Progress Toward Goals  OT Goals(current goals can now be found in the care plan section)  Progress towards OT goals: Progressing toward goals  Acute Rehab OT Goals Patient Stated Goal: to get to a chair OT Goal Formulation: With patient Time For Goal Achievement: 07/15/20 Potential to Achieve Goals: Good ADL Goals Pt Will Perform Grooming: with supervision;sitting Pt Will Perform Upper Body Bathing: with supervision;sitting (EOB or in chair) Pt Will Transfer to Toilet: with mod assist;stand pivot transfer (bariatric commode)  Plan Discharge plan remains appropriate    Co-evaluation    PT/OT/SLP Co-Evaluation/Treatment: Yes Reason for Co-Treatment: Complexity of the patient's impairments (multi-system involvement);For patient/therapist safety;To address functional/ADL transfers   OT goals addressed during session: ADL's and self-care;Strengthening/ROM      AM-PAC OT "6 Clicks" Daily Activity     Outcome Measure   Help from another person eating meals?: A Little Help from another person taking care of  personal grooming?: A Little Help from another person toileting, which includes using toliet, bedpan, or urinal?: A Lot Help from another person bathing (including washing, rinsing, drying)?: A Lot Help from another person to put on and taking off regular upper body clothing?: A Little Help from another person to put on and taking off regular lower body clothing?: Total 6 Click Score: 14    End of Session Equipment Utilized During Treatment: Oxygen;Other (comment) (back side of recliner)  OT Visit Diagnosis: Other abnormalities of gait and mobility (R26.89);Muscle weakness (generalized) (M62.81);Other symptoms and signs involving cognitive function   Activity Tolerance Patient tolerated treatment well;Other (comment) (O2 low at times - see general comments)   Patient Left in bed;with call bell/phone within reach;with bed alarm set   Nurse Communication Other (comment) (medically ok to work with, 2 areas of skin breakdown on low back- RN applied dressing while pt sitting EOB)  Time: 1110-1155 OT Time Calculation (min): 45 min  Charges: OT General Charges $OT Visit: 1 Visit OT Treatments $Self Care/Home Management : 8-22 mins  Raynald Kemp, OT Acute Rehabilitation Services Pager: [phone] Office: [phone]    Pilar Grammes 07/04/2020, 2:18 PM

## 2020-07-04 NOTE — TOC Progression Note (Signed)
Transition of Care [facility]) - Progression Note    Patient Details  Name: Felicia Warren MRN: [id number] Date of Birth: 20-Oct-1970  Transition of [facility]) CM/SW Contact  Geni Bers, RN Phone Number: 07/04/2020, 3:13 PM  Clinical Narrative:    Spoke with pt's sister Felicia Warren who is trying to help pt. [name] had pt's cell phone turned back on. Felicia Warren has been making calls for assistance for pt. Pt has no family or friends in [place]. Felicia Warren states she is unable to care for pt related to her caring for husband who is very sick. Sister Felicia Warren states that her sister [name] is a very private person and will not allow her or the family to do things for her. This was a long conversation concerning pt, pt's her actions, pt's travel down to [place] by train and not knowing where pt live in [place]. Felicia Warren has asked pt to move closer to [place] or to come back to [place] to live. Pt lost her job in [place].  Felicia Warren states that she will call [place] and [place] to check on Medicaid or any assistance that they may provide.    Expected Discharge Plan: Homeless Shelter Barriers to Discharge: Continued Medical Work up  Expected Discharge Plan and Services Expected Discharge Plan: Homeless Shelter       Living arrangements for the past 2 months: Hotel/Motel                                       Social Determinants of Health (SDOH) Interventions    Readmission Risk Interventions No flowsheet data found.

## 2020-07-04 NOTE — Progress Notes (Signed)
Physical Therapy Treatment Patient Details Name: Felicia Warren MRN: [id number] DOB: 1971/02/21 Today's Date: 07/04/2020    History of Present Illness Patient is a 50 year old female admitted with hypoglycemia and AMS, needing intubation on 5/4 and extubated on 5/8. Unsure of past medical history, patient currently living in [place] plans to move to [place] to be closer to family.    PT Comments    Pt making gradual progress.  Requiring assist of 2 for safety with OOB transfers and for assist with bed mobility. OOB activity limited due to no bariatric recliner available (all in use other rooms).  Pt did have rapid response earlier but spoke with RN who reports stable now for PT/OT.  She was on 6 L with O2 sats 87-93% majority of treatment, dropping to 81-83% with supine/sit. Pt motivated and with good participation.    Follow Up Recommendations  SNF (but no pay source)     Equipment Recommendations   (bariatric RW and BSC)    Recommendations for Other Services       Precautions / Restrictions Precautions Precautions: Fall Precaution Comments: bariatric    Mobility  Bed Mobility Overal bed mobility: Needs Assistance Bed Mobility: Rolling;Supine to Sit;Sit to Supine Rolling: Min assist   Supine to sit: Min assist;+2 for safety/equipment Sit to supine: Min assist;+2 for safety/equipment   General bed mobility comments: Cueing for use of rails and transfer techniques.  For moving up in bed requiring feet blocked, use of UE and assist with bed pad.  Requiring assist with legs with transfers and HHA to lift trunk.    Transfers Overall transfer level: Needs assistance Equipment used:  (recliner) Transfers: Sit to/from Stand Sit to Stand: Min assist;+2 safety/equipment         General transfer comment: Min A of 2 for safety; sit to stand x 2 with increased time to rise; utilized back of recliner for support.  Ambulation/Gait Ambulation/Gait assistance: Min assist;+2  safety/equipment Gait Distance (Feet): 2 Feet Assistive device:  (holding back of recliner) Gait Pattern/deviations: Step-to pattern;Decreased stride length;Shuffle Gait velocity: decreased   General Gait Details: side steps at EOB; limited due to resp status   Stairs             Wheelchair Mobility    Modified Rankin (Stroke Patients Only)       Balance Overall balance assessment: Needs assistance Sitting-balance support: Feet supported;No upper extremity supported Sitting balance-Leahy Scale: Fair Sitting balance - Comments: Sat EOB at least 20 mins during session.  At one point putting L leg up on bed to stretch and pericare   Standing balance support: During functional activity;Bilateral upper extremity supported Standing balance-Leahy Scale: Poor Standing balance comment: Required holding back of recliner but standing without assist.  Performed 2 x for ~2-3 mins                            Cognition Arousal/Alertness: Awake/alert Behavior During Therapy: WFL for tasks assessed/performed Overall Cognitive Status: Impaired/Different from baseline                   Orientation Level: Disoriented to;Time Current Attention Level: Selective Memory: Decreased short-term memory Following Commands: Follows one step commands consistently Safety/Judgement: Decreased awareness of deficits Awareness: Emergent Problem Solving: Requires verbal cues General Comments: Pt alert to self, month, year and hospital. She makes random/tangential comments at times.      Exercises General Exercises - Lower Extremity Ankle Circles/Pumps:  AROM;Both;10 reps;Supine Heel Slides: AROM;Both;5 reps;Supine    General Comments General comments (skin integrity, edema, etc.): Pt had rapid response earlier today but RN reports improved and safe to work with therapy. She was on 6 L O2 with sats 87-93% rest and sitting/standing,would briefly drop to 81-83% with supine/sit  transfers.  Pt would have preferred stay up in chair but called portables and all bariatric chairs in use.        Pertinent Vitals/Pain Pain Assessment: No/denies pain    Home Living                      Prior Function            PT Goals (current goals can now be found in the care plan section) Acute Rehab PT Goals Patient Stated Goal: to get to a chair PT Goal Formulation: With patient Time For Goal Achievement: 07/15/20 Potential to Achieve Goals: Fair Progress towards PT goals: Progressing toward goals    Frequency    Min 3X/week      PT Plan Frequency needs to be updated (increased frequency - no pay source for SNF)    Co-evaluation              AM-PAC PT "6 Clicks" Mobility   Outcome Measure  Help needed turning from your back to your side while in a flat bed without using bedrails?: A Little Help needed moving from lying on your back to sitting on the side of a flat bed without using bedrails?: Total Help needed moving to and from a bed to a chair (including a wheelchair)?: A Lot Help needed standing up from a chair using your arms (e.g., wheelchair or bedside chair)?: A Lot Help needed to walk in hospital room?: Total Help needed climbing 3-5 steps with a railing? : Total 6 Click Score: 10    End of Session Equipment Utilized During Treatment: Oxygen Activity Tolerance: Patient tolerated treatment well Patient left: in bed;with call bell/phone within reach;with nursing/sitter in room;with bed alarm set Nurse Communication: Mobility status PT Visit Diagnosis: Muscle weakness (generalized) (M62.81);Difficulty in walking, not elsewhere classified (R26.2)     Time: 9518-8416 PT Time Calculation (min) (ACUTE ONLY): 49 min  Charges:  $Therapeutic Activity: 23-37 mins                     Anise Salvo, PT Acute Rehab Services Pager [phone] [facility] [phone]     Rayetta Humphrey 07/04/2020, 1:48 PM

## 2020-07-04 NOTE — Progress Notes (Signed)
OT Cancellation Note  Patient Details Name: Felicia Warren MRN: [id number] DOB: Feb 28, 1970   Cancelled Treatment:    Reason Eval/Treat Not Completed: Medical issues which prohibited therapy. Per chart review, rapid response called earlier this morning d/t Red MEWS and increased WOB. Will plan to hold OT for now and reattempt at a later time/date.   Raynald Kemp, OT Acute Rehabilitation Services Pager: [phone] Office: [phone]  07/04/2020, 8:41 AM

## 2020-07-05 ENCOUNTER — Inpatient Hospital Stay (HOSPITAL_COMMUNITY): Payer: Self-pay

## 2020-07-05 DIAGNOSIS — G9341 Metabolic encephalopathy: Secondary | ICD-10-CM

## 2020-07-05 DIAGNOSIS — Z86711 Personal history of pulmonary embolism: Secondary | ICD-10-CM

## 2020-07-05 DIAGNOSIS — I2699 Other pulmonary embolism without acute cor pulmonale: Secondary | ICD-10-CM

## 2020-07-05 LAB — COMPREHENSIVE METABOLIC PANEL
ALT: 90 U/L — ABNORMAL HIGH (ref 0–44)
AST: 42 U/L — ABNORMAL HIGH (ref 15–41)
Albumin: 2.7 g/dL — ABNORMAL LOW (ref 3.5–5.0)
Alkaline Phosphatase: 68 U/L (ref 38–126)
Anion gap: 8 (ref 5–15)
BUN: 9 mg/dL (ref 6–20)
CO2: 33 mmol/L — ABNORMAL HIGH (ref 22–32)
Calcium: 8.1 mg/dL — ABNORMAL LOW (ref 8.9–10.3)
Chloride: 99 mmol/L (ref 98–111)
Creatinine, Ser: 0.73 mg/dL (ref 0.44–1.00)
GFR, Estimated: >60 mL/min (ref >60)
Glucose, Bld: 90 mg/dL (ref 70–99)
Potassium: 3.4 mmol/L — ABNORMAL LOW (ref 3.5–5.1)
Sodium: 140 mmol/L (ref 135–145)
Total Bilirubin: 0.9 mg/dL (ref 0.3–1.2)
Total Protein: 6.8 g/dL (ref 6.5–8.1)

## 2020-07-05 LAB — CBC WITH DIFFERENTIAL/PLATELET
Abs Immature Granulocytes: 0.16 10*3/uL — ABNORMAL HIGH (ref 0.00–0.07)
Basophils Absolute: 0 10*3/uL (ref 0.0–0.1)
Basophils Relative: 0 %
Eosinophils Absolute: 0.3 10*3/uL (ref 0.0–0.5)
Eosinophils Relative: 2 %
HCT: 36.9 % (ref 36.0–46.0)
Hemoglobin: 10.1 g/dL — ABNORMAL LOW (ref 12.0–15.0)
Immature Granulocytes: 1 %
Lymphocytes Relative: 11 %
Lymphs Abs: 1.5 10*3/uL (ref 0.7–4.0)
MCH: 23.9 pg — ABNORMAL LOW (ref 26.0–34.0)
MCHC: 27.4 g/dL — ABNORMAL LOW (ref 30.0–36.0)
MCV: 87.2 fL (ref 80.0–100.0)
Monocytes Absolute: 0.9 10*3/uL (ref 0.1–1.0)
Monocytes Relative: 7 %
Neutro Abs: 10.1 10*3/uL — ABNORMAL HIGH (ref 1.7–7.7)
Neutrophils Relative %: 79 %
Platelets: 233 10*3/uL (ref 150–400)
RBC: 4.23 MIL/uL (ref 3.87–5.11)
RDW: 18.1 % — ABNORMAL HIGH (ref 11.5–15.5)
WBC: 12.9 10*3/uL — ABNORMAL HIGH (ref 4.0–10.5)
nRBC: 0 % (ref 0.0–0.2)

## 2020-07-05 LAB — BLOOD GAS, ARTERIAL
Acid-Base Excess: 8 mmol/L — ABNORMAL HIGH (ref 0.0–2.0)
Acid-Base Excess: 7.5 mmol/L — ABNORMAL HIGH (ref 0.0–2.0)
Bicarbonate: 34.9 mmol/L — ABNORMAL HIGH (ref 20.0–28.0)
Bicarbonate: 34.8 mmol/L — ABNORMAL HIGH (ref 20.0–28.0)
FIO2: 52
FIO2: 97
Mode: 10
O2 Saturation: 81.2 %
O2 Saturation: 93.8 %
Patient temperature: 98.6
Patient temperature: 98.6
pCO2 arterial: 64.9 mmHg — ABNORMAL HIGH (ref 32.0–48.0)
pCO2 arterial: 69.6 mmHg (ref 32.0–48.0)
pH, Arterial: 7.35 (ref 7.350–7.450)
pH, Arterial: 7.32 — ABNORMAL LOW (ref 7.350–7.450)
pO2, Arterial: 49.8 mmHg — ABNORMAL LOW (ref 83.0–108.0)
pO2, Arterial: 77 mmHg — ABNORMAL LOW (ref 83.0–108.0)

## 2020-07-05 LAB — GLUCOSE, CAPILLARY
Glucose-Capillary: 90 mg/dL (ref 70–99)
Glucose-Capillary: 108 mg/dL — ABNORMAL HIGH (ref 70–99)
Glucose-Capillary: 116 mg/dL — ABNORMAL HIGH (ref 70–99)
Glucose-Capillary: 112 mg/dL — ABNORMAL HIGH (ref 70–99)
Glucose-Capillary: 107 mg/dL — ABNORMAL HIGH (ref 70–99)
Glucose-Capillary: 98 mg/dL (ref 70–99)

## 2020-07-05 LAB — MAGNESIUM: Magnesium: 1.3 mg/dL — ABNORMAL LOW (ref 1.7–2.4)

## 2020-07-05 MED ORDER — POTASSIUM CHLORIDE CRYS ER 20 MEQ PO TBCR
40.0000 meq | EXTENDED_RELEASE_TABLET | Freq: Once | ORAL | Status: AC
  Administered 2020-07-05: 40 meq via ORAL
  Filled 2020-07-05: qty 2

## 2020-07-05 MED ORDER — IPRATROPIUM-ALBUTEROL 0.5-2.5 (3) MG/3ML IN SOLN
3.0000 mL | Freq: Four times a day (QID) | RESPIRATORY_TRACT | Status: DC
  Administered 2020-07-05 – 2020-07-06 (×7): 3 mL via RESPIRATORY_TRACT
  Filled 2020-07-05 (×7): qty 3

## 2020-07-05 MED ORDER — HYDROCORTISONE 1 % EX CREA
TOPICAL_CREAM | Freq: Three times a day (TID) | CUTANEOUS | Status: DC | PRN
  Filled 2020-07-05 (×3): qty 28

## 2020-07-05 MED ORDER — MAGNESIUM SULFATE 4 GM/100ML IV SOLN
4.0000 g | Freq: Once | INTRAVENOUS | Status: AC
  Administered 2020-07-05: 4 g via INTRAVENOUS
  Filled 2020-07-05: qty 100

## 2020-07-05 NOTE — Progress Notes (Signed)
Called d/t pt seems confused and increased WOB with a RED MEWS.  Found pt Oriented and f/c.  Denies pain.  Breath sounds decreased with some wheezes.  Pt HR 111 see flow sheet for complete VS. [facility] NP informed will get a PCR, ABG and NEBS.  Pt has worn BIPAP and CPAP during the night.  Will remain in current location until labs completed for further evaluation.  Pt primary RN made aware of [facility], NP orders.

## 2020-07-05 NOTE — Progress Notes (Signed)
Pt states that she feels like her CPAP machine is not functioning properly. Pt states that she does not feel enough air movement. Saturation normal on monitor, and air movement is audible from the machine. RT called to bedside to assess machine. RT states that machine is functioning properly.

## 2020-07-05 NOTE — Progress Notes (Signed)
Rt came to give patient her breathing treatment and patient was on 10LPM HFNC sats 99%. Pt is a retainer having some confusing this am. Patient had ABG done at 06:37 results 7.32, 69.6, 77.0, 34.8. RT gave breathing treatment and then placed patient on her CPAP machine with 2LPM bleed in. Patient vitals HR 107, sats 93%, RR 20. Patient is resting comfortable.

## 2020-07-05 NOTE — Progress Notes (Addendum)
Patient placed on BIPAP 16/8 28% per [place] order for CO2 69.9 and confusing.
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
## 2020-07-05 NOTE — Progress Notes (Signed)
Rapid Response Nurse called and at bedside

## 2020-07-05 NOTE — Progress Notes (Signed)
Pt of BIPAP for her 3 hour break. Pt currently on 5 LPM HFNC sats 89%.

## 2020-07-05 NOTE — Progress Notes (Signed)
Pt has increased work of breathing. MEWS score is now red because RR is >30 and O2 is 89% on 6L [place]. Oxygen demand has increased from 4L [place] to 6L [place] in the last few hours. Awaiting RT to place patient on CPAP. RT aware. Rapid response RN Genelle aware. Hospitalist Blount also aware and at bedside.

## 2020-07-05 NOTE — Progress Notes (Signed)
A consult was placed to the IV Therapist for a new iv site;  Staff RN attempted x 2;  Pt with very poor, limited peripheral access;  Veins do not compress when assessed with ultrasound;  Able to place a new iv on the 2nd attempt, in the pt's RUA;  Pt was admitted 14 days ago, and maintaining access seems to be an issue;  Suggest central access if she will continue to need iv medications.

## 2020-07-05 NOTE — Progress Notes (Signed)
Progress Note    Felicia Warren   QQV:[id number]  DOB: 24-Jun-1970  DOA: 06/25/2020     10  PCP: Pcp, No  CC: AMS  Hospital Course: 50 yo F who presented initially as a Felicia Warren, found with altered mental status in a motel by staff. It is unknown when her last known well was. EMS found patient awake, but altered with aglucoseof40. This was corrected, but she continued to have worsening mental status. Narcan did not provide any response. In ED she required nonrebreather mask and eventual intubation for hypoxic respiratory failure. Chest x-ray with probable multifocal pneumonia. AKI, elevated INR on admit. No prior history in epic as she is from [place] and no known emergency contact. Admitted by PCCM given requirement for airway protection/intubation.   5/4 presented to ED with hypoglycemia and AMS, required intubation, PCCM consult. CXR concerning for PNA. Unasyn initiated. Tx to [facility] for beds. UDS neg, neg tylenol/ salicylates. CTH neg. Lactic acid 5.9->4.9  5/5 Hypotensive, responded to IVF + albumin, on heparin gtt but stopped due to platelet decline, not on pressors, requiring PEEP 10 / FiO2 80. RUQ [place] with cholelithiasis but no cholecystitis.   5/6 Vent needs decreased. LE venous duplex negative.   5/7 diarrhea > Placed flexiseal 5/8  5/8 WUA/SBT, extubated   Interval History:  Another rapid response overnight for her WOB. ABG shows ongoing signs that she retains which causes her confusion and decline at times. Today we're going to start her on trials of BiPAP (3hrs on, 3 hrs off approx).  Seal isn't the greatest on the mask but no fireman's mask available.   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 Needs an outpatient sleep study at some point - at risk for needing a trach if ongoing need for BiPAP intermittently as well as if she cannot lose some weight to help   Suspected LLL segmental PE, Likely POA - LLL segmental PE suspected on imaging - Transition to xarelto - discussed with pharmacy  AKI, undetermined acuity/unkown baseline, resolved AGMA/lactic acidosis, resolved Hypokalemia, resolved - Trend BMP / urinary output - Avoid nephrotoxic agents  Demand ischemia - Troponin downtrending with oxygenation - Echo = LVEF 55-60%, no WMA, McConnell's sign with moderately enlarged RV, interventricular septal flattening concerning for RV overload (likely due to OHS as above) - Suspected LLL segmental PE as above - on xarelto  Elevated liver enzymes - Cannot rule out shock liver given profound hypoxia - RUQ [place] negative. Acute hepatitis panel negative. - Labs downtrending appropriately  Hypoglycemia -resolved - Glucose in 40's on admit, no available hx - Resolved with dextrose infusion  Diarrhea, resolving - Likely in setting of TF + bowel regimen  -PRN colace, miralax  Anemia, normocytic - likely chronic anemia of chronic disease given above  -trend CBC  -  transfuse for Hgb <7%  Old records reviewed in assessment of this patient   DVT prophylaxis: Place and maintain sequential compression device Start: 06/26/20 1150 SCDs Start: 06/25/20 1828 Rivaroxaban (XARELTO) tablet 15 mg  rivaroxaban (XARELTO) tablet 20 mg   Code  Status:   Code Status: Full Code Family Communication:   Disposition Plan: Status is: Inpatient  Remains inpatient appropriate because:Unsafe d/c plan and Inpatient level of care appropriate due to severity of illness   Dispo: The patient is from: Home              Anticipated d/c is to: Pending safe dispo              Patient currently is not medically stable to d/c.   Difficult to place patient No  Risk of unplanned readmission score: Unplanned Admission- Pilot do not use: 10   Objective: Blood pressure 138/62, pulse (!) 103, temperature 98.4 F (36.9 C), temperature source Oral, resp. rate (!) 34, height 5\' 6"  (1.676 m), weight (!) 246.8 kg, SpO2 98 %.  Examination: General appearance: alert, cooperative, fatigued and no distress Head: Normocephalic, without obvious abnormality, atraumatic Eyes: EOMI Lungs: clear to auscultation bilaterally Heart: regular rate and rhythm and S1, S2 normal Abdomen: normal findings: bowel sounds normal and soft, non-tender Extremities: Obese Skin: mobility and turgor normal Neurologic: Grossly normal  Consultants:     Procedures:     Data Reviewed: I have personally reviewed following labs and imaging studies Results for orders placed or performed during the hospital encounter of 06/25/20 (from the past 24 hour(s))  Glucose, capillary     Status: Abnormal   Collection Time: 07/04/20  5:08 PM  Result Value Ref Range   Glucose-Capillary 127 (H) 70 - 99 mg/dL  Glucose, capillary     Status: Abnormal   Collection Time: 07/04/20  8:27 PM  Result Value Ref Range   Glucose-Capillary 111 (H) 70 - 99 mg/dL  Glucose, capillary     Status: None   Collection Time: 07/04/20 11:52 PM  Result Value Ref Range   Glucose-Capillary 98 70 - 99 mg/dL  Blood gas, arterial     Status: Abnormal   Collection Time: 07/05/20 12:45 AM  Result Value Ref Range   FIO2 52.00    pH, Arterial 7.350 7.350 - 7.450   pCO2 arterial 64.9 (H) 32.0 - 48.0 mmHg    pO2, Arterial 49.8 (L) 83.0 - 108.0 mmHg   Bicarbonate 34.9 (H) 20.0 - 28.0 mmol/L   Acid-Base Excess 8.0 (H) 0.0 - 2.0 mmol/L   O2 Saturation 81.2 %   Patient temperature 98.6    Allens test (pass/fail) PASS PASS  Glucose, capillary     Status: None   Collection Time: 07/05/20  4:14 AM  Result Value Ref Range   Glucose-Capillary 90 70 - 99 mg/dL  Comprehensive metabolic panel     Status: Abnormal   Collection Time: 07/05/20  4:15 AM  Result Value Ref Range   Sodium 140 135 - 145 mmol/L   Potassium 3.4 (L) 3.5 - 5.1 mmol/L   Chloride 99 98 - 111 mmol/L   CO2 33 (H) 22 - 32 mmol/L   Glucose, Bld 90 70 - 99 mg/dL   BUN 9 6 - 20 mg/dL   Creatinine, Ser 1.610.73 0.44 - 1.00 mg/dL   Calcium 8.1 (L) 8.9 - 10.3 mg/dL   Total Protein 6.8 6.5 - 8.1 g/dL   Albumin 2.7 (L) 3.5 - 5.0 g/dL   AST 42 (  H) 15 - 41 U/L   ALT 90 (H) 0 - 44 U/L   Alkaline Phosphatase 68 38 - 126 U/L   Total Bilirubin 0.9 0.3 - 1.2 mg/dL   GFR, Estimated >29 >56 mL/min   Anion gap 8 5 - 15  CBC with Differential/Platelet     Status: Abnormal   Collection Time: 07/05/20  4:15 AM  Result Value Ref Range   WBC 12.9 (H) 4.0 - 10.5 K/uL   RBC 4.23 3.87 - 5.11 MIL/uL   Hemoglobin 10.1 (L) 12.0 - 15.0 g/dL   HCT 21.3 08.6 - 57.8 %   MCV 87.2 80.0 - 100.0 fL   MCH 23.9 (L) 26.0 - 34.0 pg   MCHC 27.4 (L) 30.0 - 36.0 g/dL   RDW 46.9 (H) 62.9 - 52.8 %   Platelets 233 150 - 400 K/uL   nRBC 0.0 0.0 - 0.2 %   Neutrophils Relative % 79 %   Neutro Abs 10.1 (H) 1.7 - 7.7 K/uL   Lymphocytes Relative 11 %   Lymphs Abs 1.5 0.7 - 4.0 K/uL   Monocytes Relative 7 %   Monocytes Absolute 0.9 0.1 - 1.0 K/uL   Eosinophils Relative 2 %   Eosinophils Absolute 0.3 0.0 - 0.5 K/uL   Basophils Relative 0 %   Basophils Absolute 0.0 0.0 - 0.1 K/uL   Immature Granulocytes 1 %   Abs Immature Granulocytes 0.16 (H) 0.00 - 0.07 K/uL  Magnesium     Status: Abnormal   Collection Time: 07/05/20  4:15 AM  Result Value Ref Range   Magnesium 1.3  (L) 1.7 - 2.4 mg/dL  Blood gas, arterial     Status: Abnormal   Collection Time: 07/05/20  6:27 AM  Result Value Ref Range   FIO2 97%    Mode 10 L SALTER    pH, Arterial 7.320 (L) 7.350 - 7.450   pCO2 arterial 69.6 (HH) 32.0 - 48.0 mmHg   pO2, Arterial 77.0 (L) 83.0 - 108.0 mmHg   Bicarbonate 34.8 (H) 20.0 - 28.0 mmol/L   Acid-Base Excess 7.5 (H) 0.0 - 2.0 mmol/L   O2 Saturation 93.8 %   Patient temperature 98.6    Collection site RIGHT RADIAL    Allens test (pass/fail) PASS PASS  Glucose, capillary     Status: Abnormal   Collection Time: 07/05/20  7:34 AM  Result Value Ref Range   Glucose-Capillary 108 (H) 70 - 99 mg/dL  Glucose, capillary     Status: Abnormal   Collection Time: 07/05/20 11:05 AM  Result Value Ref Range   Glucose-Capillary 116 (H) 70 - 99 mg/dL    Recent Results (from the past 240 hour(s))  Resp Panel by RT-PCR (Flu A&B, Covid) Nasopharyngeal Swab     Status: None   Collection Time: 06/25/20  3:15 PM   Specimen: Nasopharyngeal Swab; Nasopharyngeal(NP) swabs in vial transport medium  Result Value Ref Range Status   SARS Coronavirus 2 by RT PCR NEGATIVE NEGATIVE Final    Comment: (NOTE) SARS-CoV-2 target nucleic acids are NOT DETECTED.  The SARS-CoV-2 RNA is generally detectable in upper respiratory specimens during the acute phase of infection. The lowest concentration of SARS-CoV-2 viral copies this assay can detect is 138 copies/mL. A negative result does not preclude SARS-Cov-2 infection and should not be used as the sole basis for treatment or other patient management decisions. A negative result may occur with  improper specimen collection/handling, submission of specimen other than nasopharyngeal swab, presence of viral mutation(s)  within the areas targeted by this assay, and inadequate number of viral copies(<138 copies/mL). A negative result must be combined with clinical observations, patient history, and epidemiological information. The expected  result is Negative.  Fact Sheet for Patients:  BloggerCourse.com  Fact Sheet for Healthcare Providers:  SeriousBroker.it  This test is no t yet approved or cleared by the [place] FDA and  has been authorized for detection and/or diagnosis of SARS-CoV-2 by FDA under an Emergency Use Authorization (EUA). This EUA will remain  in effect (meaning this test can be used) for the duration of the COVID-19 declaration under Section 564(b)(1) of the Act, 21 U.S.C.section 360bbb-3(b)(1), unless the authorization is terminated  or revoked sooner.       Influenza A by PCR NEGATIVE NEGATIVE Final   Influenza B by PCR NEGATIVE NEGATIVE Final    Comment: (NOTE) The Xpert Xpress SARS-CoV-2/FLU/RSV plus assay is intended as an aid in the diagnosis of influenza from Nasopharyngeal swab specimens and should not be used as a sole basis for treatment. Nasal washings and aspirates are unacceptable for Xpert Xpress SARS-CoV-2/FLU/RSV testing.  Fact Sheet for Patients: BloggerCourse.com  Fact Sheet for Healthcare Providers: SeriousBroker.it  This test is not yet approved or cleared by the [place] FDA and has been authorized for detection and/or diagnosis of SARS-CoV-2 by FDA under an Emergency Use Authorization (EUA). This EUA will remain in effect (meaning this test can be used) for the duration of the COVID-19 declaration under Section 564(b)(1) of the Act, 21 U.S.C. section 360bbb-3(b)(1), unless the authorization is terminated or revoked.  Performed at [facility], [address], [place], [place] [address]   Culture, blood (routine x 2)     Status: Abnormal   Collection Time: 06/25/20  4:45 PM   Specimen: BLOOD  Result Value Ref Range Status   Specimen Description BLOOD FEMORAL ARTERY  Final   Special Requests   Final    BOTTLES DRAWN AEROBIC AND ANAEROBIC Blood Culture  results may not be optimal due to an excessive volume of blood received in culture bottles   Culture  Setup Time   Final    GRAM POSITIVE RODS ANAEROBIC BOTTLE ONLY CRITICAL RESULT CALLED TO, READ BACK BY AND VERIFIED WITH: A. PHAM PHARMD, AT 4696 06/26/20 D. Leighton Roach    Culture (A)  Final    ENTEROCOCCUS FAECIUM STAPHYLOCOCCUS EPIDERMIDIS STAPHYLOCOCCUS SIMULANS THE SIGNIFICANCE OF ISOLATING THIS ORGANISM FROM A SINGLE SET OF BLOOD CULTURES WHEN MULTIPLE SETS ARE DRAWN IS UNCERTAIN. PLEASE NOTIFY THE MICROBIOLOGY DEPARTMENT WITHIN ONE WEEK IF SPECIATION AND SENSITIVITIES ARE REQUIRED. CLOSTRIDIUM PERFRINGENS Standardized susceptibility testing for this organism is not available. Performed at [facility], [address] [place], [place] [address]    Report Status 06/29/2020 FINAL  Final   Organism ID, Bacteria ENTEROCOCCUS FAECIUM  Final      Susceptibility   Enterococcus faecium - MIC*    AMPICILLIN <=2 SENSITIVE Sensitive     VANCOMYCIN <=0.5 SENSITIVE Sensitive     GENTAMICIN SYNERGY SENSITIVE Sensitive     * ENTEROCOCCUS FAECIUM  MRSA PCR Screening     Status: None   Collection Time: 06/25/20 10:00 PM   Specimen: Nasal Mucosa; Nasopharyngeal  Result Value Ref Range Status   MRSA by PCR NEGATIVE NEGATIVE Final    Comment:        The GeneXpert MRSA Assay (FDA approved for NASAL specimens only), is one component of a comprehensive MRSA colonization surveillance program. It is not  intended to diagnose MRSA infection nor to guide or monitor treatment for MRSA infections. Performed at [facility], [address] [place], [place] [address]   Culture, blood (routine x 2)     Status: None   Collection Time: 06/26/20  3:20 AM   Specimen: BLOOD  Result Value Ref Range Status   Specimen Description   Final    BLOOD RIGHT ANTECUBITAL Performed at [facility], [address], [place], [place] [address]    Special Requests   Final     BOTTLES DRAWN AEROBIC ONLY Blood Culture adequate volume Performed at [facility], [address] [place], [place] [address]    Culture   Final    NO GROWTH 5 DAYS Performed at [facility], [address], [place], [place] [address]    Report Status 07/01/2020 FINAL  Final  Culture, Respiratory w Gram Stain     Status: None   Collection Time: 06/26/20  8:19 AM   Specimen: Tracheal Aspirate; Respiratory  Result Value Ref Range Status   Specimen Description   Final    TRACHEAL ASPIRATE Performed at [facility], [address], [place], [place] [address]    Special Requests   Final    NONE Performed at [facility], [address] [place], [place] [address]    Gram Stain   Final    ABUNDANT WBC PRESENT,BOTH PMN AND MONONUCLEAR NO ORGANISMS SEEN    Culture   Final    Normal respiratory flora-no Staph aureus or Pseudomonas seen Performed at [facility], [address] [place], [place] [address]    Report Status 06/28/2020 FINAL  Final     Radiology Studies: DG Chest 1 View  Result Date: 07/05/2020 CLINICAL DATA:  Shortness of breath EXAM: CHEST  1 VIEW COMPARISON:  Yesterday FINDINGS: Cardiomegaly, vascular congestion, and hilar vascular thickening. Hazy and interstitial density on both sides. Airspace disease bilaterally. No visible effusion or pneumothorax. IMPRESSION: 1. Stable compared to yesterday. 2. Cardiomegaly and vascular congestion with airspace disease/lung infarct by recent chest CT. Electronically Signed   By: Marnee Spring M.D.   On: 07/05/2020 07:13   DG Chest Port 1 View  Result Date: 07/04/2020 CLINICAL DATA:  Respiratory distress EXAM: PORTABLE CHEST 1 VIEW COMPARISON:  Four days ago FINDINGS: Cardiomegaly and vascular pedicle widening. Diffuse vascular prominence with cephalized blood flow and possible edema. There could be small volume pleural fluid on the left. More focal opacity in  the right mid lung where there was atelectasis on a recent CT. Dense retrocardiac density. IMPRESSION: 1. Generalized worsening aeration compared to 4 days ago. 2. Cardiomegaly and vascular congestion with pulmonary opacities described on recent chest CT. Electronically Signed   By: Marnee Spring M.D.   On: 07/04/2020 05:30   DG Chest 1 View  Final Result    DG Chest Port 1 View  Final Result    CT ANGIO CHEST PE W OR WO CONTRAST  Final Result  Addendum 1 of 1  ADDENDUM REPORT: 06/30/2020 15:17    ADDENDUM:  In addition to findings outlined in the previous report there is  dilation of the main pulmonary artery up to 3.2 cm which may  indicate pulmonary arterial hypertension.    Critical Value/emergent results were called by telephone at the time  of interpretation on 06/30/2020 at 3:16 pm to provider Canary Brim ,  who verbally acknowledged these results.  Electronically Signed    By: Donzetta Kohut M.D.    On: 06/30/2020 15:17      Final    DG Chest Port 1 View  Final Result    DG CHEST PORT 1 VIEW  Final Result    VAS [place] LOWER EXTREMITY VENOUS (DVT)  Final Result    US Abdomen Limited RUQ (LIVER/GB)  Final Result    DG Chest 1 View  Final Result    US RENAL  Final Result    DG CHEST PORT 1 VIEW  Final Result    CT Head Wo Contrast  Final Result    DG Chest Portable 1 View  Final Result      Scheduled Meds: . (feeding supplement) PROSource Plus  30 mL Oral TID BM  . furosemide  40 mg Intravenous BID  . ipratropium-albuterol  3 mL Nebulization Q6H  . mouth rinse  15 mL Mouth Rinse BID  . multivitamin with minerals  1 tablet Oral Daily  . pantoprazole  40 mg Oral QHS  . Rivaroxaban  15 mg Oral BID WC   Followed by  . [START ON 07/22/2020] rivaroxaban  20 mg Oral Q supper   PRN Meds: docusate sodium, hydrocortisone cream, polyethylene glycol Continuous Infusions:   LOS: 10 days  Time spent: Greater than 50% of the 35 minute visit was spent  in counseling/coordination of care for the patient as laid out in the A&P.   Lewie Chamber, MD [facility] 07/05/2020, 2:16 PM

## 2020-07-05 NOTE — Progress Notes (Signed)
Critical Lab Value arterial pCO2 69.6. Hospitalist paged and notified. Will continue to monitor.

## 2020-07-05 NOTE — Progress Notes (Signed)
Pt back on BIPAP per MD order.

## 2020-07-06 ENCOUNTER — Inpatient Hospital Stay: Payer: Self-pay

## 2020-07-06 LAB — COMPREHENSIVE METABOLIC PANEL
ALT: 69 U/L — ABNORMAL HIGH (ref 0–44)
AST: 32 U/L (ref 15–41)
Albumin: 2.7 g/dL — ABNORMAL LOW (ref 3.5–5.0)
Alkaline Phosphatase: 59 U/L (ref 38–126)
Anion gap: 6 (ref 5–15)
BUN: 7 mg/dL (ref 6–20)
CO2: 35 mmol/L — ABNORMAL HIGH (ref 22–32)
Calcium: 7.9 mg/dL — ABNORMAL LOW (ref 8.9–10.3)
Chloride: 99 mmol/L (ref 98–111)
Creatinine, Ser: 0.64 mg/dL (ref 0.44–1.00)
GFR, Estimated: >60 mL/min (ref >60)
Glucose, Bld: 91 mg/dL (ref 70–99)
Potassium: 3.6 mmol/L (ref 3.5–5.1)
Sodium: 140 mmol/L (ref 135–145)
Total Bilirubin: 0.9 mg/dL (ref 0.3–1.2)
Total Protein: 6.4 g/dL — ABNORMAL LOW (ref 6.5–8.1)

## 2020-07-06 LAB — CBC WITH DIFFERENTIAL/PLATELET
Abs Immature Granulocytes: 0.08 10*3/uL — ABNORMAL HIGH (ref 0.00–0.07)
Basophils Absolute: 0 10*3/uL (ref 0.0–0.1)
Basophils Relative: 0 %
Eosinophils Absolute: 0.2 10*3/uL (ref 0.0–0.5)
Eosinophils Relative: 2 %
HCT: 35.2 % — ABNORMAL LOW (ref 36.0–46.0)
Hemoglobin: 9.9 g/dL — ABNORMAL LOW (ref 12.0–15.0)
Immature Granulocytes: 1 %
Lymphocytes Relative: 13 %
Lymphs Abs: 1.1 10*3/uL (ref 0.7–4.0)
MCH: 24.1 pg — ABNORMAL LOW (ref 26.0–34.0)
MCHC: 28.1 g/dL — ABNORMAL LOW (ref 30.0–36.0)
MCV: 85.6 fL (ref 80.0–100.0)
Monocytes Absolute: 0.6 10*3/uL (ref 0.1–1.0)
Monocytes Relative: 7 %
Neutro Abs: 7 10*3/uL (ref 1.7–7.7)
Neutrophils Relative %: 77 %
Platelets: 261 10*3/uL (ref 150–400)
RBC: 4.11 MIL/uL (ref 3.87–5.11)
RDW: 18.1 % — ABNORMAL HIGH (ref 11.5–15.5)
WBC: 9.1 10*3/uL (ref 4.0–10.5)
nRBC: 0 % (ref 0.0–0.2)

## 2020-07-06 LAB — MAGNESIUM: Magnesium: 1.6 mg/dL — ABNORMAL LOW (ref 1.7–2.4)

## 2020-07-06 LAB — GLUCOSE, CAPILLARY
Glucose-Capillary: 92 mg/dL (ref 70–99)
Glucose-Capillary: 81 mg/dL (ref 70–99)
Glucose-Capillary: 118 mg/dL — ABNORMAL HIGH (ref 70–99)
Glucose-Capillary: 116 mg/dL — ABNORMAL HIGH (ref 70–99)
Glucose-Capillary: 122 mg/dL — ABNORMAL HIGH (ref 70–99)

## 2020-07-06 MED ORDER — IPRATROPIUM-ALBUTEROL 0.5-2.5 (3) MG/3ML IN SOLN: 3.0000 mL | Freq: Four times a day (QID) | RESPIRATORY_TRACT | Status: DC | PRN

## 2020-07-06 MED ORDER — MAGNESIUM OXIDE -MG SUPPLEMENT 400 (240 MG) MG PO TABS
800.0000 mg | ORAL_TABLET | Freq: Once | ORAL | Status: AC
  Administered 2020-07-06: 800 mg via ORAL
  Filled 2020-07-06: qty 2

## 2020-07-06 MED ORDER — POTASSIUM CHLORIDE CRYS ER 20 MEQ PO TBCR
40.0000 meq | EXTENDED_RELEASE_TABLET | Freq: Two times a day (BID) | ORAL | Status: AC
  Administered 2020-07-06 (×2): 40 meq via ORAL
  Filled 2020-07-06 (×2): qty 2

## 2020-07-06 MED ORDER — LIP MEDEX EX OINT
TOPICAL_OINTMENT | CUTANEOUS | Status: DC | PRN
  Filled 2020-07-06: qty 7

## 2020-07-06 NOTE — Progress Notes (Signed)
Patient placed on BIPAP by RN for low sats.

## 2020-07-06 NOTE — Progress Notes (Signed)
NAME:  Cady Hafen, MRN:  [id number], DOB:  03-Dec-1970, LOS: 11 ADMISSION DATE:  06/25/2020, CONSULTATION DATE:  07/06/20 REFERRING MD:  EDP, CHIEF COMPLAINT:  AMS   History of Present Illness:  50 y/o F who presented initially as a [name], found with altered mental status in a motel by staff.  It is unknown when her last known well was.  EMS found patient awake, but altered with a glucose of 40.  This was corrected, but she continued to have worsening mental status.  Narcan did not provide any response.  In ED she required nonrebreather mask and eventual intubation for hypoxic respiratory failure.  Chest x-ray with probable multifocal pneumonia.  AKI, elevated INR on admit.  No prior history in epic and no known emergency contact. Work up consistent with RV overload, suspected OHS / OSA, LLL segmental PE on heparin.  AKI resolved.   Pertinent  Medical History  Obesity ?  OSA  Significant Hospital Events: Including procedures, antibiotic start and stop dates in addition to other pertinent events    5/4 presented to ED with hypoglycemia and AMS, required intubation, PCCM consult. CXR concerning for PNA.  Unasyn initiated. Tx to [facility] for beds. UDS neg, neg tylenol/ salicylates. CTH neg. Lactic acid 5.9-> 4.9 . 5/5 Hypotensive, responded to IVF + albumin, on heparin gtt but stopped due to platelet decline, not on pressors, requiring PEEP 10 / FiO2 80. RUQ [place] with cholelithiasis but no cholecystitis.   Marland Kitchen 5/6 Vent needs decreased. LE venous duplex negative.  . 5/7 diarrhea > Placed flexiseal 5/8  5/8 WUA/SBT, extubated  5/9 CTA Chest with LLL segmental PE with associated airspace disease and pulmonary infarct, dilation of the main pulmonary artery up to 3.2cm.  To TRH.   Interim History / Subjective:  Reconsulted for inability to wean from BIPAP. Patient on BIPAP and denies complaints. Desats with or without BIPAP.  Objective   Blood pressure 124/65, pulse 97, temperature (!) 97.3 F (36.3  C), temperature source Oral, resp. rate 14, height 5\' 6"  (1.676 m), weight (!) 246.8 kg, SpO2 90 %.    FiO2 (%):  [35 %] 35 %   Intake/Output Summary (Last 24 hours) at 07/06/2020 1333 Last data filed at 07/06/2020 1310 Gross per 24 hour  Intake 480 ml  Output 3050 ml  Net -2570 ml   Filed Weights   07/01/20 0500 07/02/20 0433 07/05/20 0120  Weight: (!) 248.6 kg (!) 244.9 kg (!) 246.8 kg   Exam: Constitutional: no acute distress  Eyes: EOMI, pupils equal Ears, nose, mouth, and throat: BIPAP in place with good seal Cardiovascular: RRR, +SEM, ext warm Respiratory: shallow effort, nonlabored Gastrointestinal: soft, hypoactive BS Skin: No rashes, normal turgor Neurologic: moves all 4 ext to command Psychiatric: Seems oriented Ext: no edema  Labs/imaging that I havepersonally reviewed  (right click and "Reselect all SmartList Selections" daily)  CTA chest from 5/9  BMP - sr cr 0.74  CBC - WBC 13.1, Hgb 10.7, platelets 145  O2 needs  BiPAP use   Resolved Hospital Problem list   Hypotension / Shock  Thrombocytopenia AKI  Assessment & Plan:  Acute on chronic cor pulmonale- related to severe OSA/OHS, probable volume overloaded state of heart, small PE - Push diuresis, replete K - Continue xarelto - Up to chair with hoyer if able - Wean BIPAP during day, consider tolerating lower sats if mental status is okay - BIPAP qHS and PRN - Calorie restrict diet, will ask RD if  they can help with this - Would benefit from NIV at home vs. SNF but [place] no insurance and [place] so this [place] be difficult - Will follow with you  Best practice (right click and "Reselect all SmartList Selections" daily)  Per primary  Myrla Halsted MD PCCM

## 2020-07-06 NOTE — Progress Notes (Signed)
Progress Note    Felicia Warren   ZOX:[id number]  DOB: 01/15/1971  DOA: 06/25/2020     11  PCP: Pcp, No  CC: AMS  Hospital Course: 50 yo F who presented initially as a Felicia SquibbJane Warren, found with altered mental status in a motel by staff. It is unknown when her last known well was. EMS found patient awake, but altered with [id number]. This was corrected, but she continued to have worsening mental status. Narcan did not provide any response. In ED she required nonrebreather mask and eventual intubation for hypoxic respiratory failure. Chest x-ray with probable multifocal pneumonia. AKI, elevated INR on admit. No prior history in epic as [place] is from [place] and no known emergency contact. Admitted by PCCM given requirement for airway protection/intubation.   5/4 presented to ED with hypoglycemia and AMS, required intubation, PCCM consult. CXR concerning for PNA. Unasyn initiated. [facility]. UDS neg, neg tylenol/ salicylates. CTH neg. Lactic acid 5.9->4.9  5/5 Hypotensive, responded to IVF + albumin, on heparin gtt but stopped due to platelet decline, not on pressors, requiring PEEP 10 / FiO2 80. RUQ US with cholelithiasis but no cholecystitis.   5/6 Vent needs decreased. LE venous duplex negative.   5/7 diarrhea > Placed flexiseal 5/8  5/8 WUA/SBT, extubated   Interval History:  No events overnight. Awake and alert on BiPAP this am and speaks well/easily to me. Per staff seems to be desatting off BiPAP still.  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 Probable OSA/OHS - Rapid response on 5/12 and 5/13 evenings due to inc WOB/desats - tolerating BiPAP well with 3 hours on 3 off approx but due to this remains BiPAP dependent - continue BiPAP qhs and 3on/3off  but will try to space out further in efforts to wean off BiPAP more - continue diuresis - re-consulted pulmonology given bipap dependency; patient terrible trach candidate due to lack of resources at this time but may still be inevitable depending on clinical course - follow up RD eval for possible calorie restrictions - seal of BiPAP also remains subpar   Acute Metabolic Encephalopathy  - etiology 2/2 chronic CO2 retention - 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 Needs an outpatient sleep study at some point - at risk for needing a trach if ongoing need for BiPAP intermittently as well as if she cannot lose some weight to help   Suspected LLL segmental PE, Likely POA - LLL segmental PE suspected on imaging - Transition to xarelto - discussed with pharmacy  AKI, undetermined acuity/unkown baseline, resolved AGMA/lactic acidosis, resolved Hypokalemia, resolved - Trend BMP / urinary output - Avoid nephrotoxic agents  Demand ischemia - Troponin downtrending with oxygenation - Echo = LVEF 55-60%, no WMA, McConnell's sign with moderately enlarged RV, interventricular septal flattening concerning for RV overload (likely due to OHS as above) - Suspected LLL segmental PE as above - on xarelto  Elevated liver enzymes - Cannot rule out shock liver given profound hypoxia - RUQ US negative. Acute hepatitis panel negative. - Labs downtrending appropriately  Hypoglycemia -resolved - Glucose in 40's on admit, no available hx - Resolved with dextrose infusion  Diarrhea, resolving - Likely in setting of TF + bowel regimen  -PRN colace, miralax  Anemia, normocytic - likely chronic anemia of chronic disease given above  -trend CBC  -transfuse for Hgb <7%  Old records reviewed in assessment of this patient   DVT prophylaxis: Place and maintain sequential compression device Start: 06/26/20 1150 SCDs Start: 06/25/20 1828 Rivaroxaban (XARELTO) tablet 15 mg  rivaroxaban (XARELTO) tablet 20 mg    Code Status:   Code Status: Full Code Family Communication:   Disposition Plan: Status is: Inpatient  Remains inpatient appropriate because:Unsafe d/c plan and Inpatient level of care appropriate due to severity of illness   Dispo: The patient is from: Home              Anticipated d/c is to: Pending safe dispo              Patient currently is not medically stable to d/c.   Difficult to place patient No  Risk of unplanned readmission score: Unplanned Admission- Pilot do not use: 10.04   Objective: Blood pressure (!) 109/53, pulse 98, temperature (!) 97.3 F (36.3 C), temperature source Oral, resp. rate 19, height 5\' 6"  (1.676 m), weight (!) 246.8 kg, SpO2 96 %.  Examination: General appearance: alert, cooperative, fatigued and no distress Head: Normocephalic, without obvious abnormality, atraumatic Eyes: EOMI Lungs: distant breath sounds Heart: regular rate and rhythm and S1, S2 normal Abdomen: normal findings: bowel sounds normal and soft, non-tender Extremities: Obese Skin: mobility and turgor normal Neurologic: Grossly normal  Consultants:     Procedures:     Data Reviewed: I have personally reviewed following labs and imaging studies Results for orders placed or performed during the hospital encounter of 06/25/20 (from the past 24 hour(s))  Glucose, capillary     Status: Abnormal   Collection Time: 07/05/20  4:44 PM  Result Value Ref Range   Glucose-Capillary 112 (H) 70 - 99 mg/dL  Glucose, capillary     Status: Abnormal   Collection Time: 07/05/20  9:14 PM  Result Value Ref Range   Glucose-Capillary 107 (H) 70 - 99 mg/dL   Comment 1 Notify RN    Comment 2 Document in Chart   Glucose, capillary     Status: None   Collection Time: 07/05/20 11:26 PM  Result Value Ref Range   Glucose-Capillary 98 70 - 99 mg/dL   Comment 1 Notify RN    Comment 2 Document in Chart   Glucose, capillary     Status: None   Collection Time: 07/06/20  3:13 AM  Result Value Ref  Range   Glucose-Capillary 92 70 - 99 mg/dL   Comment 1 Notify RN    Comment 2 Document in Chart   Comprehensive metabolic panel     Status: Abnormal   Collection Time: 07/06/20  6:49 AM  Result Value Ref Range   Sodium 140 135 - 145 mmol/L   Potassium 3.6 3.5 - 5.1 mmol/L   Chloride 99 98 - 111 mmol/L   CO2 35 (H) 22 - 32 mmol/L   Glucose, Bld 91 70 - 99 mg/dL   BUN 7 6 - 20 mg/dL   Creatinine, Ser 07/08/20 0.44 - 1.00 mg/dL   Calcium 7.9 (L) 8.9 - 10.3 mg/dL   Total Protein 6.4 (L) 6.5 - 8.1 g/dL   Albumin 2.7 (L) 3.5 - 5.0 g/dL   AST 32 15 - 41 U/L   ALT 69 (H) 0 - 44 U/L   Alkaline Phosphatase 59 38 - 126 U/L   Total Bilirubin 0.9 0.3 - 1.2 mg/dL   GFR, Estimated 1.93 >79 mL/min   Anion gap 6 5 - 15  CBC with Differential/Platelet  Status: Abnormal   Collection Time: 07/06/20  6:49 AM  Result Value Ref Range   WBC 9.1 4.0 - 10.5 K/uL   RBC 4.11 3.87 - 5.11 MIL/uL   Hemoglobin 9.9 (L) 12.0 - 15.0 g/dL   HCT 51.0 (L) 25.8 - 52.7 %   MCV 85.6 80.0 - 100.0 fL   MCH 24.1 (L) 26.0 - 34.0 pg   MCHC 28.1 (L) 30.0 - 36.0 g/dL   RDW 78.2 (H) 42.3 - 53.6 %   Platelets 261 150 - 400 K/uL   nRBC 0.0 0.0 - 0.2 %   Neutrophils Relative % 77 %   Neutro Abs 7.0 1.7 - 7.7 K/uL   Lymphocytes Relative 13 %   Lymphs Abs 1.1 0.7 - 4.0 K/uL   Monocytes Relative 7 %   Monocytes Absolute 0.6 0.1 - 1.0 K/uL   Eosinophils Relative 2 %   Eosinophils Absolute 0.2 0.0 - 0.5 K/uL   Basophils Relative 0 %   Basophils Absolute 0.0 0.0 - 0.1 K/uL   Immature Granulocytes 1 %   Abs Immature Granulocytes 0.08 (H) 0.00 - 0.07 K/uL  Magnesium     Status: Abnormal   Collection Time: 07/06/20  6:49 AM  Result Value Ref Range   Magnesium 1.6 (L) 1.7 - 2.4 mg/dL  Glucose, capillary     Status: None   Collection Time: 07/06/20  7:19 AM  Result Value Ref Range   Glucose-Capillary 81 70 - 99 mg/dL  Glucose, capillary     Status: Abnormal   Collection Time: 07/06/20 11:09 AM  Result Value Ref Range    Glucose-Capillary 118 (H) 70 - 99 mg/dL    No results found for this or any previous visit (from the past 240 hour(s)).   Radiology Studies: DG Chest 1 View  Result Date: 07/05/2020 CLINICAL DATA:  Shortness of breath EXAM: CHEST  1 VIEW COMPARISON:  Yesterday FINDINGS: Cardiomegaly, vascular congestion, and hilar vascular thickening. Hazy and interstitial density on both sides. Airspace disease bilaterally. No visible effusion or pneumothorax. IMPRESSION: 1. Stable compared to yesterday. 2. Cardiomegaly and vascular congestion with airspace disease/lung infarct by recent chest CT. Electronically Signed   By: Marnee Spring M.D.   On: 07/05/2020 07:13   [place] EKG SITE RITE  Result Date: 07/06/2020 If Site Rite image not attached, placement could not be confirmed due to current cardiac rhythm.  [place] EKG SITE RITE  Final Result    DG Chest 1 View  Final Result    DG Chest Port 1 View  Final Result    CT ANGIO CHEST PE W OR WO CONTRAST  Final Result  Addendum 1 of 1  ADDENDUM REPORT: 06/30/2020 15:17    ADDENDUM:  In addition to findings outlined in the previous report there is  dilation of the main pulmonary artery up to 3.2 cm which may  indicate pulmonary arterial hypertension.    Critical Value/emergent results were called by telephone at the time  of interpretation on 06/30/2020 at 3:16 pm to provider Canary Brim ,  who verbally acknowledged these results.      Electronically Signed    By: Donzetta Kohut M.D.    On: 06/30/2020 15:17      Final    DG Chest Port 1 View  Final Result    DG CHEST PORT 1 VIEW  Final Result    VAS [place] LOWER EXTREMITY VENOUS (DVT)  Final Result    US Abdomen Limited RUQ (LIVER/GB)  Final Result  DG Chest 1 View  Final Result    US RENAL  Final Result    DG CHEST PORT 1 VIEW  Final Result    CT Head Wo Contrast  Final Result    DG Chest Portable 1 View  Final Result      Scheduled Meds: . (feeding supplement) PROSource  Plus  30 mL Oral TID BM  . furosemide  40 mg Intravenous BID  . ipratropium-albuterol  3 mL Nebulization Q6H  . mouth rinse  15 mL Mouth Rinse BID  . multivitamin with minerals  1 tablet Oral Daily  . pantoprazole  40 mg Oral QHS  . potassium chloride  40 mEq Oral BID  . Rivaroxaban  15 mg Oral BID WC   Followed by  . [START ON 07/22/2020] rivaroxaban  20 mg Oral Q supper   PRN Meds: docusate sodium, hydrocortisone cream, polyethylene glycol Continuous Infusions:   LOS: 11 days  Time spent: Greater than 50% of the 35 minute visit was spent in counseling/coordination of care for the patient as laid out in the A&P.   Lewie Chamber, MD [facility] 07/06/2020, 2:11 PM

## 2020-07-06 NOTE — Progress Notes (Signed)
Pt currently off BIPAP at this time.  

## 2020-07-06 NOTE — Progress Notes (Signed)
Pt Refused 0500 labs, Phlebotomy will attempt again at 0700 with other labs

## 2020-07-07 LAB — GLUCOSE, CAPILLARY
Glucose-Capillary: 104 mg/dL — ABNORMAL HIGH (ref 70–99)
Glucose-Capillary: 84 mg/dL (ref 70–99)
Glucose-Capillary: 85 mg/dL (ref 70–99)
Glucose-Capillary: 85 mg/dL (ref 70–99)
Glucose-Capillary: 92 mg/dL (ref 70–99)
Glucose-Capillary: 93 mg/dL (ref 70–99)
Glucose-Capillary: 94 mg/dL (ref 70–99)

## 2020-07-07 LAB — CBC WITH DIFFERENTIAL/PLATELET
Abs Immature Granulocytes: 0.09 10*3/uL — ABNORMAL HIGH (ref 0.00–0.07)
Basophils Absolute: 0 10*3/uL (ref 0.0–0.1)
Basophils Relative: 0 %
Eosinophils Absolute: 0.2 10*3/uL (ref 0.0–0.5)
Eosinophils Relative: 3 %
HCT: 35.9 % — ABNORMAL LOW (ref 36.0–46.0)
Hemoglobin: 10.1 g/dL — ABNORMAL LOW (ref 12.0–15.0)
Immature Granulocytes: 1 %
Lymphocytes Relative: 15 %
Lymphs Abs: 1.2 10*3/uL (ref 0.7–4.0)
MCH: 23.9 pg — ABNORMAL LOW (ref 26.0–34.0)
MCHC: 28.1 g/dL — ABNORMAL LOW (ref 30.0–36.0)
MCV: 85.1 fL (ref 80.0–100.0)
Monocytes Absolute: 0.6 10*3/uL (ref 0.1–1.0)
Monocytes Relative: 8 %
Neutro Abs: 5.9 10*3/uL (ref 1.7–7.7)
Neutrophils Relative %: 73 %
Platelets: 263 10*3/uL (ref 150–400)
RBC: 4.22 MIL/uL (ref 3.87–5.11)
RDW: 18 % — ABNORMAL HIGH (ref 11.5–15.5)
WBC: 8.2 10*3/uL (ref 4.0–10.5)
nRBC: 0 % (ref 0.0–0.2)

## 2020-07-07 LAB — BASIC METABOLIC PANEL
Anion gap: 7 (ref 5–15)
BUN: 7 mg/dL (ref 6–20)
CO2: 38 mmol/L — ABNORMAL HIGH (ref 22–32)
Calcium: 7.9 mg/dL — ABNORMAL LOW (ref 8.9–10.3)
Chloride: 96 mmol/L — ABNORMAL LOW (ref 98–111)
Creatinine, Ser: 0.67 mg/dL (ref 0.44–1.00)
GFR, Estimated: >60 mL/min (ref >60)
Glucose, Bld: 85 mg/dL (ref 70–99)
Potassium: 3.6 mmol/L (ref 3.5–5.1)
Sodium: 141 mmol/L (ref 135–145)

## 2020-07-07 LAB — MAGNESIUM: Magnesium: 1.5 mg/dL — ABNORMAL LOW (ref 1.7–2.4)

## 2020-07-07 MED ORDER — POTASSIUM CHLORIDE CRYS ER 20 MEQ PO TBCR
40.0000 meq | EXTENDED_RELEASE_TABLET | Freq: Once | ORAL | Status: AC
  Administered 2020-07-07: 40 meq via ORAL
  Filled 2020-07-07: qty 2

## 2020-07-07 MED ORDER — ENSURE MAX PROTEIN PO LIQD
11.0000 [oz_av] | Freq: Two times a day (BID) | ORAL | Status: DC
  Administered 2020-07-07 – 2020-07-19 (×9): 11 [oz_av] via ORAL
  Filled 2020-07-07 (×31): qty 330

## 2020-07-07 MED ORDER — ACETAZOLAMIDE 250 MG PO TABS
250.0000 mg | ORAL_TABLET | Freq: Two times a day (BID) | ORAL | Status: DC
  Administered 2020-07-07 – 2020-07-15 (×17): 250 mg via ORAL
  Filled 2020-07-07 (×18): qty 1

## 2020-07-07 MED ORDER — MAGNESIUM OXIDE -MG SUPPLEMENT 400 (240 MG) MG PO TABS
800.0000 mg | ORAL_TABLET | Freq: Every day | ORAL | Status: DC
  Administered 2020-07-07: 800 mg via ORAL
  Filled 2020-07-07: qty 2

## 2020-07-07 MED ORDER — MAGNESIUM SULFATE 2 GM/50ML IV SOLN
2.0000 g | Freq: Once | INTRAVENOUS | Status: AC
  Administered 2020-07-07: 2 g via INTRAVENOUS
  Filled 2020-07-07: qty 50

## 2020-07-07 NOTE — Progress Notes (Signed)
Hold off PICC placement per Lewie Chamber [place]. Patient has adequate PIV at this time per primary RN.

## 2020-07-07 NOTE — TOC Progression Note (Signed)
Transition of Care [facility]) - Progression Note    Patient Details  Name: Felicia Warren MRN: [id number] Date of Birth: 07/13/1970  [facility] CM/SW Contact  Geni Bers, RN Phone Number: 07/07/2020, 3:47 PM  Clinical Narrative:    TOC will continue to follow pt for discharge needs. At present time pt is from [place] and not able to return related to medically reasons.   Expected Discharge Plan: Homeless Shelter Barriers to Discharge: Continued Medical Work up  Expected Discharge Plan and Services Expected Discharge Plan: Homeless Shelter       Living arrangements for the past 2 months: Hotel/Motel                                       Social Determinants of Health (SDOH) Interventions    Readmission Risk Interventions No flowsheet data found.

## 2020-07-07 NOTE — Progress Notes (Signed)
Patient tolerated BIPAP well throughout night.

## 2020-07-07 NOTE — Progress Notes (Signed)
NAME:  Felicia Warren, MRN:  [id number], DOB:  1970/09/03, LOS: 12 ADMISSION DATE:  06/25/2020, CONSULTATION DATE:  07/07/20 REFERRING MD:  EDP, CHIEF COMPLAINT:  AMS   History of Present Illness:  50 y/o F who presented initially as a [name], found with altered mental status in a motel by staff.  It is unknown when her last known well was.  EMS found patient awake, but altered with a glucose of 40.  This was corrected, but she continued to have worsening mental status.  Narcan did not provide any response.  In ED she required nonrebreather mask and eventual intubation for hypoxic respiratory failure.  Chest x-ray with probable multifocal pneumonia.  AKI, elevated INR on admit.  No prior history in epic and no known emergency contact. Work up consistent with RV overload, suspected OHS / OSA, LLL segmental PE on heparin.  AKI resolved.   Pertinent  Medical History  Obesity ?  OSA  Significant Hospital Events: Including procedures, antibiotic start and stop dates in addition to other pertinent events    5/4 presented to ED with hypoglycemia and AMS, required intubation, PCCM consult. CXR concerning for PNA.  Unasyn initiated. Tx to [facility] for beds. UDS neg, neg tylenol/ salicylates. CTH neg. Lactic acid 5.9-> 4.9 . 5/5 Hypotensive, responded to IVF + albumin, on heparin gtt but stopped due to platelet decline, not on pressors, requiring PEEP 10 / FiO2 80. RUQ [place] with cholelithiasis but no cholecystitis.   Marland Kitchen 5/6 Vent needs decreased. LE venous duplex negative.  . 5/7 diarrhea > Placed flexiseal 5/8  5/8 WUA/SBT, extubated  5/9 CTA Chest with LLL segmental PE with associated airspace disease and pulmonary infarct, dilation of the main pulmonary artery up to 3.2cm.  To TRH.   Interim History / Subjective:  Doing great this am. Eating breakfast. Denies breathing complaints.  Objective   Blood pressure 137/72, pulse 96, temperature 99 F (37.2 C), temperature source Oral, resp. rate (!) 26, height  5\' 6"  (1.676 m), weight (!) 246.8 kg, SpO2 92 %.        Intake/Output Summary (Last 24 hours) at 07/07/2020 0858 Last data filed at 07/07/2020 07/09/2020 Gross per 24 hour  Intake 360 ml  Output 2300 ml  Net -1940 ml   Filed Weights   07/01/20 0500 07/02/20 0433 07/05/20 0120  Weight: (!) 248.6 kg (!) 244.9 kg (!) 246.8 kg   Exam: Constitutional: no acute distress  Eyes: EOMI, pupils equal Ears, nose, mouth, and throat: malampatti 4, trachea midline Cardiovascular: RRR, +SEM, ext warm Respiratory: shallow effort, nonlabored Gastrointestinal: soft, + BS Skin: No rashes, normal turgor Neurologic: moves all 4 ext to command Psychiatric: Seems oriented, answering questions appropriately Ext: no edema  Labs/imaging that I havepersonally reviewed  (right click and "Reselect all SmartList Selections" daily)  Cr looks okay Mg/K repleting Neg 1.4L  Resolved Hospital Problem list   Hypotension / Shock  Thrombocytopenia AKI  Assessment & Plan:  Acute on chronic cor pulmonale- related to severe OSA/OHS, probable volume overloaded state of heart, small PE - Push diuresis, replete K, add diamox to reduce contraction alkalosis risk - Continue xarelto - Up to chair with hoyer if able - BIPAP qHS and PRN - Calorie restrict diet, will ask RD if they can help with this - Would benefit from NIV at home vs. SNF but has no insurance and is [place] so this will be difficult; goal I think should be to wean off BIPAP completely  and she can f/u as OP back in [place] - Will follow with you  Best practice (right click and "Reselect all SmartList Selections" daily)  Per primary  Myrla Halsted MD PCCM

## 2020-07-07 NOTE — Progress Notes (Signed)
Progress Note    Felicia Warren   NGE:[id number]  DOB: 11/07/1970  DOA: 06/25/2020     12  PCP: Pcp, No  CC: AMS  Hospital Course: 50 yo F who presented initially as a Felicia Warren, found with altered mental status in a motel by staff. It is unknown when her last known well was. EMS found patient awake, but altered with [id number]. This was corrected, but she continued to have worsening mental status. Narcan did not provide any response. In ED she required nonrebreather mask and eventual intubation for hypoxic respiratory failure. Chest x-ray with probable multifocal pneumonia. AKI, elevated INR on admit. No prior history in epic as she is from [place] and no known emergency contact. Admitted by PCCM given requirement for airway protection/intubation.   5/4 presented to ED with hypoglycemia and AMS, required intubation, PCCM consult. CXR concerning for PNA. Unasyn initiated. [facility]. UDS neg, neg tylenol/ salicylates. CTH neg. Lactic acid 5.9->4.9  5/5 Hypotensive, responded to IVF + albumin, on heparin gtt but stopped due to platelet decline, not on pressors, requiring PEEP 10 / FiO2 80. RUQ US with cholelithiasis but no cholecystitis.   5/6 Vent needs decreased. LE venous duplex negative.   5/7 diarrhea > Placed flexiseal 5/8  5/8 WUA/SBT, extubated   Interval History:  No events overnight.  Seen while on nasal cannula this morning.  Tolerated BiPAP overnight well.  She is alert and oriented but at times she does have some noticed confusion but when asking her, she states this is her usual mental baseline.  Remains on 4 L nasal cannula this morning but sats were in the mid to upper 90s.  Rediscussed sat goals with nursing; she does not need sats greater than 92%.  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 Probable OSA/OHS - Rapid response on 5/12 and 5/13 evenings due to inc WOB/desats - starting to tolerate spacing out BiPAP more; goal is to get on just nightly BiPAP - saturation goal 88-92%, wean O2 accordingly - appreciate pulmonology following again - continue lasix (started on diamox per pulm to prevent contraction alkalosis while on lasix as I suspect she'll diurese quite a bit; diuresed 11+ L in 4 days) - follow up RD eval for possible calorie restrictions - seal of BiPAP also remains subpar at times   Acute Metabolic Encephalopathy  - etiology 2/2 chronic CO2 retention - 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 Needs an outpatient sleep study at some point - at risk for needing a trach if ongoing need for BiPAP intermittently as well as if she cannot lose some weight to help   Suspected LLL segmental PE, Likely POA - LLL segmental PE suspected on imaging - Transition to xarelto - discussed with pharmacy  AKI, undetermined acuity/unkown baseline, resolved AGMA/lactic acidosis, resolved Hypokalemia, resolved Contraction alkalosis - Trend BMP / urinary output - Avoid nephrotoxic agents - continue diamox; see discussion above   Demand ischemia - Troponin downtrending with oxygenation - Echo = LVEF 55-60%, no WMA, McConnell's sign with moderately enlarged RV, interventricular septal flattening concerning for RV overload (likely due to OHS as above) - Suspected LLL segmental PE as above - on xarelto  Elevated liver enzymes - resolving  - Cannot rule out shock liver given profound hypoxia - RUQ US negative. Acute hepatitis panel negative. - Labs downtrending  appropriately  Hypoglycemia -resolved - Glucose in 40's on admit, no available hx - Resolved with dextrose infusion  Diarrhea, resolving - Likely in setting of TF + bowel regimen  -PRN colace, miralax  Anemia, normocytic - likely chronic anemia of chronic  disease given above  -trend CBC  -transfuse for Hgb <7%  Old records reviewed in assessment of this patient   DVT prophylaxis: Place and maintain sequential compression device Start: 06/26/20 1150 SCDs Start: 06/25/20 1828 Rivaroxaban (XARELTO) tablet 15 mg  rivaroxaban (XARELTO) tablet 20 mg   Code Status:   Code Status: Full Code Family Communication:   Disposition Plan: Status is: Inpatient  Remains inpatient appropriate because:Unsafe d/c plan and Inpatient level of care appropriate due to severity of illness   Dispo: The patient is from: Home              Anticipated d/c is to: Pending safe dispo              Patient currently is not medically stable to d/c.   Difficult to place patient No  Risk of unplanned readmission score: Unplanned Admission- Pilot do not use: 10.67   Objective: Blood pressure 137/72, pulse 86, temperature 99 F (37.2 C), temperature source Oral, resp. rate 20, height 5\' 6"  (1.676 m), weight (!) 246.8 kg, SpO2 93 %.  Examination: General appearance: alert, cooperative, fatigued and no distress Head: Normocephalic, without obvious abnormality, atraumatic Eyes: EOMI Lungs: distant breath sounds Heart: regular rate and rhythm and S1, S2 normal Abdomen: normal findings: bowel sounds normal and soft, non-tender Extremities: Obese Skin: mobility and turgor normal Neurologic: Grossly normal  Consultants:   Pulmonology   Procedures:     Data Reviewed: I have personally reviewed following labs and imaging studies Results for orders placed or performed during the hospital encounter of 06/25/20 (from the past 24 hour(s))  Glucose, capillary     Status: Abnormal   Collection Time: 07/06/20  3:48 PM  Result Value Ref Range   Glucose-Capillary 116 (H) 70 - 99 mg/dL  Glucose, capillary     Status: Abnormal   Collection Time: 07/06/20  8:24 PM  Result Value Ref Range   Glucose-Capillary 122 (H) 70 - 99 mg/dL  Glucose, capillary     Status: None    Collection Time: 07/07/20 12:01 AM  Result Value Ref Range   Glucose-Capillary 92 70 - 99 mg/dL  Glucose, capillary     Status: None   Collection Time: 07/07/20  4:03 AM  Result Value Ref Range   Glucose-Capillary 84 70 - 99 mg/dL  CBC with Differential/Platelet     Status: Abnormal   Collection Time: 07/07/20  4:12 AM  Result Value Ref Range   WBC 8.2 4.0 - 10.5 K/uL   RBC 4.22 3.87 - 5.11 MIL/uL   Hemoglobin 10.1 (L) 12.0 - 15.0 g/dL   HCT 07/09/20 (L) 91.4 - 78.2 %   MCV 85.1 80.0 - 100.0 fL   MCH 23.9 (L) 26.0 - 34.0 pg   MCHC 28.1 (L) 30.0 - 36.0 g/dL   RDW 95.6 (H) 21.3 - 08.6 %   Platelets 263 150 - 400 K/uL   nRBC 0.0 0.0 - 0.2 %   Neutrophils Relative % 73 %   Neutro Abs 5.9 1.7 - 7.7 K/uL   Lymphocytes Relative 15 %   Lymphs Abs 1.2 0.7 - 4.0 K/uL   Monocytes Relative 8 %   Monocytes Absolute 0.6 0.1 - 1.0 K/uL   Eosinophils Relative 3 %  Eosinophils Absolute 0.2 0.0 - 0.5 K/uL   Basophils Relative 0 %   Basophils Absolute 0.0 0.0 - 0.1 K/uL   Immature Granulocytes 1 %   Abs Immature Granulocytes 0.09 (H) 0.00 - 0.07 K/uL  Magnesium     Status: Abnormal   Collection Time: 07/07/20  4:12 AM  Result Value Ref Range   Magnesium 1.5 (L) 1.7 - 2.4 mg/dL  Basic metabolic panel     Status: Abnormal   Collection Time: 07/07/20  4:12 AM  Result Value Ref Range   Sodium 141 135 - 145 mmol/L   Potassium 3.6 3.5 - 5.1 mmol/L   Chloride 96 (L) 98 - 111 mmol/L   CO2 38 (H) 22 - 32 mmol/L   Glucose, Bld 85 70 - 99 mg/dL   BUN 7 6 - 20 mg/dL   Creatinine, Ser 3.00 0.44 - 1.00 mg/dL   Calcium 7.9 (L) 8.9 - 10.3 mg/dL   GFR, Estimated >51 >10 mL/min   Anion gap 7 5 - 15  Glucose, capillary     Status: None   Collection Time: 07/07/20  8:02 AM  Result Value Ref Range   Glucose-Capillary 93 70 - 99 mg/dL  Glucose, capillary     Status: Abnormal   Collection Time: 07/07/20  1:46 PM  Result Value Ref Range   Glucose-Capillary 104 (H) 70 - 99 mg/dL    No results found for  this or any previous visit (from the past 240 hour(s)).   Radiology Studies: [place] EKG SITE RITE  Result Date: 07/06/2020 If Site Rite image not attached, placement could not be confirmed due to current cardiac rhythm.  [place] EKG SITE RITE  Final Result    DG Chest 1 View  Final Result    DG Chest Port 1 View  Final Result    CT ANGIO CHEST PE W OR WO CONTRAST  Final Result  Addendum 1 of 1  ADDENDUM REPORT: 06/30/2020 15:17    ADDENDUM:  In addition to findings outlined in the previous report there is  dilation of the main pulmonary artery up to 3.2 cm which may  indicate pulmonary arterial hypertension.    Critical Value/emergent results were called by telephone at the time  of interpretation on 06/30/2020 at 3:16 pm to provider Canary Brim ,  who verbally acknowledged these results.      Electronically Signed    By: Donzetta Kohut M.D.    On: 06/30/2020 15:17      Final    DG Chest Port 1 View  Final Result    DG CHEST PORT 1 VIEW  Final Result    VAS [place] LOWER EXTREMITY VENOUS (DVT)  Final Result    US Abdomen Limited RUQ (LIVER/GB)  Final Result    DG Chest 1 View  Final Result    US RENAL  Final Result    DG CHEST PORT 1 VIEW  Final Result    CT Head Wo Contrast  Final Result    DG Chest Portable 1 View  Final Result      Scheduled Meds: . (feeding supplement) PROSource Plus  30 mL Oral TID BM  . acetaZOLAMIDE  250 mg Oral BID  . furosemide  40 mg Intravenous BID  . magnesium oxide  800 mg Oral Daily  . mouth rinse  15 mL Mouth Rinse BID  . multivitamin with minerals  1 tablet Oral Daily  . pantoprazole  40 mg Oral QHS  . Rivaroxaban  15  mg Oral BID WC   Followed by  . [START ON 07/22/2020] rivaroxaban  20 mg Oral Q supper   PRN Meds: docusate sodium, hydrocortisone cream, ipratropium-albuterol, lip balm, polyethylene glycol Continuous Infusions:   LOS: 12 days  Time spent: Greater than 50% of the 35 minute visit was spent in  counseling/coordination of care for the patient as laid out in the A&P.   Lewie Chamber, MD [facility] 07/07/2020, 3:27 PM

## 2020-07-07 NOTE — Progress Notes (Signed)
Nutrition Follow-up  DOCUMENTATION CODES:   Morbid obesity  INTERVENTION:  - will d/c Magic Cup TID. - continue 30 ml Prosource Plus TID, each supplement provides 100 kcal and 15 grams protein.  - will order Ensure Max BID, each supplement provides 150 kcal and 30 grams protein. - will add Heart Healthy diet restriction to diet order. - weigh patient daily.    NUTRITION DIAGNOSIS:   Increased nutrient needs related to acute illness as evidenced by estimated needs. -revised  GOAL:   Patient will meet greater than or equal to 90% of their needs -unmet  MONITOR:   PO intake,Supplement acceptance,Labs,Weight trends,Skin  REASON FOR ASSESSMENT:   Consult Other (Comment) (consult from MD for calorie restriction)  ASSESSMENT:   50 year-old female who presented as a Felicia Warren, found with AMS in a motel by staff.  It is unknown when her last known well was.  EMS found patient awake by CBG of 40 mg/dl.  This was corrected, but she continued to have worsening mental status; Narcan did not provide any response.  In ED she required NRB mask and eventual intubation for hypoxic respiratory failure.  CXR with probable multifocal PNA.  Unable to talk with patient as RN and tech were in her room providing patient care and working with patient on rotating in bed.   She was last weighed on 5/14 at which time weight was 544 lb which is +100 lb compared to weight on 5/6. Non-pitting edema to BUE and mild pitting edema to BLE documented in the edema section of flow sheet. Review of I/O flow sheet indicates 2950 ml urine output so far today.   Will make adjustments to ONS and diet order as outlined above.   Recent meal intakes: 5/13--50% of breakfast; 410 kcal and 15 grams protein 5/14--40% of breakfast and 30% of lunch; total of 497 kcal and 21 grams protein 5/15--100% of breakfast; 682 kcal and 26 grams protein 5/16--75% of breakfast; 305 kcal and 18 grams protein   She has been accepting  Prosource Plus 100% of the time offered which provides 300 kcal and 45 grams protein/day.   Patient is not meeting nutrition needs; not meeting kcal restriction of 1500 kcal/day. Calorie restriction in the acute setting not appropriate for patient who is not meeting needs, especially protein needs as patient is at high risk of skin breakdown from limited mobility.   Ongoing diuresis so daily weighing is beneficial to determine effectiveness.   Labs reviewed; CBGs: 92, 84, 93, 104 mg/dl, Ca: 7.9 mg/dl, Mg: 1.5 mg/dl.  Medications reviewed; 40 mg IV lasix BID, 800 mg mag-ox/day, 2 g IV Mg sulfate x1 run 5/16, 1 tablet multivitamin with minerals/day, 40 mg oral protonix/day, 40 mEq Klor-Con x2 doses 5/15 and x1 dose 5/16, 40 mEq Klor-Con x1 dose 5/16.    Diet Order:   Diet Order            DIET DYS 2 Room service appropriate? Yes; Fluid consistency: Thin  Diet effective now                 EDUCATION NEEDS:   Education needs have been addressed  Skin:  Skin Assessment: Skin Integrity Issues: Skin Integrity Issues:: Other (Comment) Other: MASD + MARS to bilateral lumbar area  Last BM:  3/16 (type 6 x1)  Height:   Ht Readings from Last 1 Encounters:  06/25/20 5\' 6"  (1.676 m)    Weight:   Wt Readings from Last 1 Encounters:  07/05/20 07/07/20)  246.8 kg    Ideal Body Weight:  59.1 kg  BMI:  Body mass index is 87.8 kg/m.  Estimated Nutritional Needs:   Kcal:  1775-2070 kcal (30-35 kcal/kg IBW)  Protein:  125-140 grams  Fluid:  >/= 1.5 L/day      Trenton Gammon, MS, RD, LDN, CNSC Inpatient Clinical Dietitian RD pager # available in [facility]  After hours/weekend pager # available in [facility]

## 2020-07-07 NOTE — Progress Notes (Signed)
Currently PT is oriented to time, place and self. PT denies drowsiness at this time (was talking on phone when RT entered room). PT does not appear to be in respiratory distress at this time. RT spoke with RN- states that PT wore BiPAP through the night. RT and RN agree that if PT is sleeping (or other indications for BiPAP) PT will be placed on BiPAP.

## 2020-07-08 LAB — CBC WITH DIFFERENTIAL/PLATELET
Abs Immature Granulocytes: 0.08 10*3/uL — ABNORMAL HIGH (ref 0.00–0.07)
Basophils Absolute: 0 10*3/uL (ref 0.0–0.1)
Basophils Relative: 0 %
Eosinophils Absolute: 0.2 10*3/uL (ref 0.0–0.5)
Eosinophils Relative: 3 %
HCT: 36.6 % (ref 36.0–46.0)
Hemoglobin: 10.1 g/dL — ABNORMAL LOW (ref 12.0–15.0)
Immature Granulocytes: 1 %
Lymphocytes Relative: 13 %
Lymphs Abs: 1 10*3/uL (ref 0.7–4.0)
MCH: 23.8 pg — ABNORMAL LOW (ref 26.0–34.0)
MCHC: 27.6 g/dL — ABNORMAL LOW (ref 30.0–36.0)
MCV: 86.1 fL (ref 80.0–100.0)
Monocytes Absolute: 0.6 10*3/uL (ref 0.1–1.0)
Monocytes Relative: 8 %
Neutro Abs: 5.7 10*3/uL (ref 1.7–7.7)
Neutrophils Relative %: 75 %
Platelets: 274 10*3/uL (ref 150–400)
RBC: 4.25 MIL/uL (ref 3.87–5.11)
RDW: 18.3 % — ABNORMAL HIGH (ref 11.5–15.5)
WBC: 7.7 10*3/uL (ref 4.0–10.5)
nRBC: 0 % (ref 0.0–0.2)

## 2020-07-08 LAB — BASIC METABOLIC PANEL
Anion gap: 7 (ref 5–15)
BUN: 6 mg/dL (ref 6–20)
CO2: 37 mmol/L — ABNORMAL HIGH (ref 22–32)
Calcium: 7.9 mg/dL — ABNORMAL LOW (ref 8.9–10.3)
Chloride: 96 mmol/L — ABNORMAL LOW (ref 98–111)
Creatinine, Ser: 0.79 mg/dL (ref 0.44–1.00)
GFR, Estimated: >60 mL/min (ref >60)
Glucose, Bld: 91 mg/dL (ref 70–99)
Potassium: 3.7 mmol/L (ref 3.5–5.1)
Sodium: 140 mmol/L (ref 135–145)

## 2020-07-08 LAB — GLUCOSE, CAPILLARY
Glucose-Capillary: 92 mg/dL (ref 70–99)
Glucose-Capillary: 82 mg/dL (ref 70–99)
Glucose-Capillary: 99 mg/dL (ref 70–99)
Glucose-Capillary: 105 mg/dL — ABNORMAL HIGH (ref 70–99)

## 2020-07-08 LAB — MAGNESIUM: Magnesium: 1.6 mg/dL — ABNORMAL LOW (ref 1.7–2.4)

## 2020-07-08 MED ORDER — MAGNESIUM OXIDE -MG SUPPLEMENT 400 (240 MG) MG PO TABS
800.0000 mg | ORAL_TABLET | Freq: Two times a day (BID) | ORAL | Status: DC
  Administered 2020-07-08 – 2020-07-25 (×35): 800 mg via ORAL
  Filled 2020-07-08 (×35): qty 2

## 2020-07-08 NOTE — Progress Notes (Signed)
Occupational Therapy Treatment Patient Details Name: Felicia Warren MRN: [id number] DOB: 10/17/1970 Today's Date: 07/08/2020    History of present illness Patient is a 50 year old female admitted with hypoglycemia and AMS, needing intubation on 5/4 and extubated on 5/8. Unsure of past medical history, patient currently living in [place] plans to move to [place] to be closer to family.   OT comments  Patient with bariatric recliner in room, educated on benefits of transferring to chair vs sitting up at edge of bed to eat breakfast. Patient verbalized understanding. With increased time and head of bed elevated patient min A for LE assist to sit up at edge of bed, total A donning socks. With min x2 for safety patient tolerate standing for ~3 minutes to "stretch" her legs with bilateral upper extremity support on recliner chair. Min cues and assist to safely pivot to recliner. Patient able to perform sit to stand 3 times from recliner, min A for safety. VSS on 2L. Continue with POC to progress patient mobility and independence with self care.    Follow Up Recommendations  Other (comment) (difficulty placement, possible D/C home pending acute progress)    Equipment Recommendations  Other (comment) (bariatric commode)       Precautions / Restrictions Precautions Precautions: Fall Precaution Comments: bariatric Restrictions Weight Bearing Restrictions: No       Mobility Bed Mobility Overal bed mobility: Needs Assistance Bed Mobility: Supine to Sit     Supine to sit: Min assist;HOB elevated     General bed mobility comments: min A for LEs    Transfers Overall transfer level: Needs assistance Equipment used: None Transfers: Sit to/from [employer] Sit to Stand: Min assist;+2 safety/equipment Stand pivot transfers: Min assist;+2 safety/equipment       General transfer comment: patient able to perform pivot transfer to recliner with min x2 for safety and  additional 3 sit to stands from recliner    Balance Overall balance assessment: Needs assistance Sitting-balance support: Feet supported Sitting balance-Leahy Scale: Fair     Standing balance support: Single extremity supported;Bilateral upper extremity supported Standing balance-Leahy Scale: Poor Standing balance comment: holding onto chair arm for stability during pivot                           ADL either performed or assessed with clinical judgement   ADL Overall ADL's : Needs assistance/impaired                     Lower Body Dressing: Total assistance;Bed level Lower Body Dressing Details (indicate cue type and reason): don socks Toilet Transfer: Minimal assistance;+2 for safety/equipment;Stand-pivot Toilet Transfer Details (indicate cue type and reason): to bariatric recliner, patient taking prolonged time in standing to stretch before pivoting to recliner chair. Perform sit to stand from chair 3 times min G for safety         Functional mobility during ADLs: Minimal assistance;+2 for safety/equipment;Cueing for safety;Cueing for sequencing General ADL Comments: patient wanting to sit up at edge of bed for breakfast, educate patient on importance of increasing OOB tolerance and increased safety with sitting up in chair               Cognition Arousal/Alertness: Awake/alert Behavior During Therapy: [facility] for tasks assessed/performed Overall Cognitive Status: No family/caregiver present to determine baseline cognitive functioning  General Comments: patient tangential at times distractible              General Comments VSS on 2L    Pertinent Vitals/ Pain       Pain Assessment: Faces Faces Pain Scale: No hurt         Frequency  Min 2X/week        Progress Toward Goals  OT Goals(current goals can now be found in the care plan section)  Progress towards OT goals: Progressing toward  goals  Acute Rehab OT Goals Patient Stated Goal: to get to a chair OT Goal Formulation: With patient Time For Goal Achievement: 07/15/20 Potential to Achieve Goals: Good ADL Goals Pt Will Perform Grooming: with supervision;sitting Pt Will Perform Upper Body Bathing: with supervision;sitting (EOB or in chair) Pt Will Transfer to Toilet: stand pivot transfer;with supervision (bariatric commode)  Plan Discharge plan remains appropriate    Co-evaluation    PT/OT/SLP Co-Evaluation/Treatment: Yes Reason for Co-Treatment: For patient/therapist safety;To address functional/ADL transfers   OT goals addressed during session: ADL's and self-care      AM-PAC OT "6 Clicks" Daily Activity     Outcome Measure   Help from another person eating meals?: None Help from another person taking care of personal grooming?: A Little Help from another person toileting, which includes using toliet, bedpan, or urinal?: A Lot Help from another person bathing (including washing, rinsing, drying)?: A Lot Help from another person to put on and taking off regular upper body clothing?: A Little Help from another person to put on and taking off regular lower body clothing?: Total 6 Click Score: 15    End of Session Equipment Utilized During Treatment: Oxygen  OT Visit Diagnosis: Other abnormalities of gait and mobility (R26.89);Muscle weakness (generalized) (M62.81);Other symptoms and signs involving cognitive function   Activity Tolerance Patient tolerated treatment well   Patient Left in chair;with call bell/phone within reach;with chair alarm set   Nurse Communication Mobility status        Time: 3254-9826 OT Time Calculation (min): 42 min  Charges: OT General Charges $OT Visit: 1 Visit OT Treatments $Self Care/Home Management : 23-37 mins  Marlyce Huge OT OT pager: [phone]   Carmelia Roller 07/08/2020, 12:48 PM

## 2020-07-08 NOTE — Progress Notes (Signed)
NAME:  Felicia Warren, MRN:  [id number], DOB:  Sep 23, 1970, LOS: 13 ADMISSION DATE:  06/25/2020, CONSULTATION DATE:  07/08/20 REFERRING MD:  EDP, CHIEF COMPLAINT:  AMS   History of Present Illness:  50 y/o F who presented initially as a [name], found with altered mental status in a motel by staff.  It is unknown when her last known well was.  EMS found patient awake, but altered with a glucose of 40.  This was corrected, but she continued to have worsening mental status.  Narcan did not provide any response.  In ED she required nonrebreather mask and eventual intubation for hypoxic respiratory failure.  Chest x-ray with probable multifocal pneumonia.  AKI, elevated INR on admit.  No prior history in epic and no known emergency contact. Work up consistent with RV overload, suspected OHS / OSA, LLL segmental PE on heparin.  AKI resolved.   Pertinent  Medical History  Obesity ?  OSA  Significant Hospital Events: Including procedures, antibiotic start and stop dates in addition to other pertinent events    5/4 presented to ED with hypoglycemia and AMS, required intubation, PCCM consult. CXR concerning for PNA.  Unasyn initiated. Tx to [facility] for beds. UDS neg, neg tylenol/ salicylates. CTH neg. Lactic acid 5.9-> 4.9 . 5/5 Hypotensive, responded to IVF + albumin, on heparin gtt but stopped due to platelet decline, not on pressors, requiring PEEP 10 / FiO2 80. RUQ [place] with cholelithiasis but no cholecystitis.   Marland Kitchen 5/6 Vent needs decreased. LE venous duplex negative.  . 5/7 diarrhea > Placed flexiseal 5/8  5/8 WUA/SBT, extubated  5/9 CTA Chest with LLL segmental PE with associated airspace disease and pulmonary infarct, dilation of the main pulmonary artery up to 3.2cm.  To TRH.   5/16 started standing diamox   Interim History / Subjective:  No distress   Objective   Blood pressure 122/62, pulse 91, temperature 98.3 F (36.8 C), temperature source Axillary, resp. rate 16, height 5\' 6"  (1.676 m),  weight (Abnormal) 246.8 kg, SpO2 95 %.    FiO2 (%):  [35 %] 35 %   Intake/Output Summary (Last 24 hours) at 07/08/2020 0804 Last data filed at 07/07/2020 1600 Gross per 24 hour  Intake 290 ml  Output 3200 ml  Net -2910 ml   Filed Weights   07/01/20 0500 07/02/20 0433 07/05/20 0120  Weight: (Abnormal) 248.6 kg (Abnormal) 244.9 kg (Abnormal) 246.8 kg   Exam:  General- this is a 50 year old female who is lying in bed. She is in no distress this am  HENT Neck is large. MMM no JVD sclera not icteric  pulm dec t/o now on 2 lpm no accessory use 93% spo2 Card rrr abd obese soft not tender Ext warm and dry  Neuro intact,  gu cl yellow Labs/imaging that I havepersonally reviewed  (right click and "Reselect all SmartList Selections" daily)  See below   Resolved Hospital Problem list   Hypotension / Shock  Thrombocytopenia AKI Acute metabolic (hypercarbic related) encephalopathy  Demand ischemia  Hypoglycemia  Assessment & Plan:   Acute on chronic cor pulmonale severe OSA/OHS Small PE    Acute on chronic cor pulmonale- related to severe OSA/OHS, probable volume overloaded state of heart, small PE -clinically seems OK. Not sure how far off baseline she is. I think she probably does need BIPAP at night but this will be a challenge at best if not close to impossible given no insurance and [place]  Plan/rec Cont to push diuresis. Changed to Diamox yesterday (5/16), will need to check chemistry daily Mobilize Cont oxygen Calorie restriction BIPAP at HS and PRN abg when closer to dc    Best practice (right click and "Reselect all SmartList Selections" daily)  Per primary Simonne Martinet ACNP-BC [facility] Pulmonary/Critical Care Pager # [phone] OR # [phone] if no answer

## 2020-07-08 NOTE — Progress Notes (Addendum)
Physical Therapy Treatment Patient Details Name: Felicia Warren MRN: [id number] DOB: [birth year] Today's Date: 07/08/2020    History of Present Illness Patient is a 50 year old female admitted with hypoglycemia and AMS, needing intubation on 5/4 and extubated on 5/8. Unsure of past medical history, patient currently living in [place] plans to move to [place] to be closer to family.    PT Comments    Assisted patient to stand from recliner, Patient rises slowly, stands to balance, supporting  Self on armrest and 1 side of RW, encouraged her to use RW. Patient then turned 360* to pivot to bed.  Follow Up Recommendations  SNF-does not have funds? Homeless shelter     Equipment Recommendations   (TBA)    Recommendations for Other Services       Precautions / Restrictions Precautions Precautions: Fall Precaution Comments: bariatric    Mobility  Bed Mobility   Bed Mobility: Sit to Supine       Sit to supine: Mod assist   General bed mobility comments: mod for  legs back onto bed    Transfers Overall transfer level: Needs assistance Equipment used: Rolling walker (2 wheeled) Transfers: Sit to/from [employer] Sit to Stand: Min assist Stand pivot transfers: Min assist       General transfer comment: patient stands from recliner, uses arm rest, RW in place but not wanting to hold it. patient performed a 360* turn to sit down onto bed.  Ambulation/Gait                 Stairs             Wheelchair Mobility    Modified Rankin (Stroke Patients Only)       Balance Overall balance assessment: Needs assistance Sitting-balance support: Feet supported Sitting balance-Leahy Scale: Fair Sitting balance - Comments: Sat EOB at least 20 mins during session.  At one point putting L leg up on bed to stretch and pericare   Standing balance support: Single extremity supported;Bilateral upper extremity supported Standing balance-Leahy Scale:  Fair Standing balance comment: holding onto chair arm for stability during pivot                            Cognition                                              Exercises      General Comments        Pertinent Vitals/Pain      Home Living                      Prior Function            PT Goals (current goals can now be found in the care plan section) Progress towards PT goals: Progressing toward goals    Frequency    Min 3X/week      PT Plan Current plan remains appropriate    Co-evaluation              AM-PAC PT "6 Clicks" Mobility   Outcome Measure  Help needed turning from your back to your side while in a flat bed without using bedrails?: A Little Help needed moving from lying on your back to sitting on the side of a flat bed without  using bedrails?: A Little Help needed moving to and from a bed to a chair (including a wheelchair)?: A Lot Help needed standing up from a chair using your arms (e.g., wheelchair or bedside chair)?: A Lot Help needed to walk in hospital room?: Total Help needed climbing 3-5 steps with a railing? : Total 6 Click Score: 12    End of Session Equipment Utilized During Treatment: Oxygen Activity Tolerance: Patient tolerated treatment well Patient left: in bed;with call bell/phone within reach;with nursing/sitter in room Nurse Communication: Mobility status PT Visit Diagnosis: Muscle weakness (generalized) (M62.81);Difficulty in walking, not elsewhere classified (R26.2)     Time: 1351-1410 PT Time Calculation (min) (ACUTE ONLY): 19 min  Charges:  $Therapeutic Activity: 8-22 mins                    [name] PT Acute Rehabilitation Services Pager [phone] Office [phone]   [name] 07/08/2020, 4:29 PM

## 2020-07-08 NOTE — Progress Notes (Signed)
Progress Note    [name]   TML:[id number]  DOB: December 23, 1970  DOA: 06/25/2020     13  PCP: Pcp, No  CC: AMS  Hospital Course: 50 yo F who presented initially as a Felicia Warren, found with altered mental status in a motel by staff. It is unknown when her last known well was. EMS found patient awake, but altered with [id number]. This was corrected, but she continued to have worsening mental status. Narcan did not provide any response. In ED she required nonrebreather mask and eventual intubation for hypoxic respiratory failure. Chest x-ray with probable multifocal pneumonia. AKI, elevated INR on admit. No prior history in epic as she is from [place] and no known emergency contact. Admitted by PCCM given requirement for airway protection/intubation.   5/4 presented to ED with hypoglycemia and AMS, required intubation, PCCM consult. CXR concerning for PNA. Unasyn initiated. Tx to [facility] for beds. UDS neg, neg tylenol/ salicylates. CTH neg. Lactic acid 5.9->4.9  5/5 Hypotensive, responded to IVF + albumin, on heparin gtt but stopped due to platelet decline, not on pressors, requiring PEEP 10 / FiO2 80. RUQ [place] with cholelithiasis but no cholecystitis.   5/6 Vent needs decreased. LE venous duplex negative.   5/7 diarrhea > Placed flexiseal 5/8  5/8 WUA/SBT, extubated   Interval History:  No events overnight.   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 Probable OSA/OHS - Rapid response on 5/12 and 5/13 evenings due to inc WOB/desats - starting to tolerate spacing out BiPAP more; goal is to get on just nightly BiPAP (or none at all would be ideal) - saturation goal 88-92%, wean O2 accordingly - appreciate pulmonology following again - continue lasix (started on  diamox per pulm to prevent contraction alkalosis while on lasix as I suspect she'll diurese quite a bit; diuresed 14+ L in 5 days) - follow up RD eval for possible calorie restrictions - seal of BiPAP also remains subpar at times   Acute Metabolic Encephalopathy  - etiology 2/2 chronic CO2 retention - 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 Needs an outpatient sleep study at some point - at risk for needing a trach if ongoing need for BiPAP intermittently as well as if she cannot lose some weight to help   Suspected LLL segmental PE, Likely POA - LLL segmental PE suspected on imaging - Transition to xarelto - discussed with pharmacy  AKI, undetermined acuity/unkown baseline, resolved AGMA/lactic acidosis, resolved Hypokalemia, resolved Contraction alkalosis - Trend BMP / urinary output - Avoid nephrotoxic agents - continue diamox; see discussion above   Social barriers - patient from [place] and uninsured; her wish is to return to [place] to collect belongings and move to [place] as she has family here, however she has nowhere here to live yet if she moved - not sure if family would take her in after this hospitalization but this has been brought up to them and it didn't appear to be an option - if she's needing nightly BIPAP, that will be a factor; and/or if she needs continuous O2 that will also be a factor - mobility extremely limited; PT following; staff needs to try and get her OOB to chair with meals as able - until these issues are fixed and/or addressed, she will remain in the hospital a prolonged amount of time  Demand ischemia - Troponin downtrending  with oxygenation - Echo = LVEF 55-60%, no WMA, McConnell's sign with moderately enlarged RV, interventricular septal flattening concerning for RV overload (likely due to OHS as above) - Suspected LLL segmental PE as above - on xarelto  Elevated liver enzymes - resolving  - Cannot rule out shock liver given profound hypoxia - RUQ [place]  negative. Acute hepatitis panel negative. - Labs downtrending appropriately  Hypoglycemia -resolved - Glucose in 40's on admit, no available hx - Resolved with dextrose infusion  Diarrhea, resolving - Likely in setting of TF + bowel regimen  -PRN colace, miralax  Anemia, normocytic - likely chronic anemia of chronic disease given above  -trend CBC  -transfuse for Hgb <7%  Old records reviewed in assessment of this patient   DVT prophylaxis: Place and maintain sequential compression device Start: 06/26/20 1150 SCDs Start: 06/25/20 1828 Rivaroxaban (XARELTO) tablet 15 mg  rivaroxaban (XARELTO) tablet 20 mg   Code Status:   Code Status: Full Code Family Communication:   Disposition Plan: Status is: Inpatient  Remains inpatient appropriate because:Unsafe d/c plan and Inpatient level of care appropriate due to severity of illness   Dispo: The patient is from: Home              Anticipated d/c is to: Pending safe dispo              Patient currently is not medically stable to d/c.   Difficult to place patient No  Risk of unplanned readmission score: Unplanned Admission- Pilot do not use: 10.84   Objective: Blood pressure 126/68, pulse 96, temperature 97.9 F (36.6 C), temperature source Oral, resp. rate (!) 28, height 5\' 6"  (1.676 m), weight (!) 246.8 kg, SpO2 93 %.  Examination: General appearance: alert, cooperative, fatigued and no distress Head: Normocephalic, without obvious abnormality, atraumatic Eyes: EOMI Lungs: distant breath sounds Heart: regular rate and rhythm and S1, S2 normal Abdomen: normal findings: bowel sounds normal and soft, non-tender Extremities: Obese Skin: mobility and turgor normal Neurologic: Grossly normal  Consultants:   Pulmonology   Procedures:     Data Reviewed: I have personally reviewed following labs and imaging studies Results for orders placed or performed during the hospital encounter of 06/25/20 (from the past 24  hour(s))  Glucose, capillary     Status: None   Collection Time: 07/07/20  5:02 PM  Result Value Ref Range   Glucose-Capillary 85 70 - 99 mg/dL  Glucose, capillary     Status: None   Collection Time: 07/07/20  7:35 PM  Result Value Ref Range   Glucose-Capillary 94 70 - 99 mg/dL  Glucose, capillary     Status: None   Collection Time: 07/07/20 11:50 PM  Result Value Ref Range   Glucose-Capillary 85 70 - 99 mg/dL  Basic metabolic panel     Status: Abnormal   Collection Time: 07/08/20  4:03 AM  Result Value Ref Range   Sodium 140 135 - 145 mmol/L   Potassium 3.7 3.5 - 5.1 mmol/L   Chloride 96 (L) 98 - 111 mmol/L   CO2 37 (H) 22 - 32 mmol/L   Glucose, Bld 91 70 - 99 mg/dL   BUN 6 6 - 20 mg/dL   Creatinine, Ser 07/10/20 0.44 - 1.00 mg/dL   Calcium 7.9 (L) 8.9 - 10.3 mg/dL   GFR, Estimated 5.05 >39 mL/min   Anion gap 7 5 - 15  CBC with Differential/Platelet     Status: Abnormal   Collection Time: 07/08/20  4:03 AM  Result Value Ref Range   WBC 7.7 4.0 - 10.5 K/uL   RBC 4.25 3.87 - 5.11 MIL/uL   Hemoglobin 10.1 (L) 12.0 - 15.0 g/dL   HCT 65.736.6 84.636.0 - 96.246.0 %   MCV 86.1 80.0 - 100.0 fL   MCH 23.8 (L) 26.0 - 34.0 pg   MCHC 27.6 (L) 30.0 - 36.0 g/dL   RDW 95.218.3 (H) 84.111.5 - 32.415.5 %   Platelets 274 150 - 400 K/uL   nRBC 0.0 0.0 - 0.2 %   Neutrophils Relative % 75 %   Neutro Abs 5.7 1.7 - 7.7 K/uL   Lymphocytes Relative 13 %   Lymphs Abs 1.0 0.7 - 4.0 K/uL   Monocytes Relative 8 %   Monocytes Absolute 0.6 0.1 - 1.0 K/uL   Eosinophils Relative 3 %   Eosinophils Absolute 0.2 0.0 - 0.5 K/uL   Basophils Relative 0 %   Basophils Absolute 0.0 0.0 - 0.1 K/uL   Immature Granulocytes 1 %   Abs Immature Granulocytes 0.08 (H) 0.00 - 0.07 K/uL  Magnesium     Status: Abnormal   Collection Time: 07/08/20  4:03 AM  Result Value Ref Range   Magnesium 1.6 (L) 1.7 - 2.4 mg/dL  Glucose, capillary     Status: None   Collection Time: 07/08/20  4:31 AM  Result Value Ref Range   Glucose-Capillary 92 70 -  99 mg/dL  Glucose, capillary     Status: None   Collection Time: 07/08/20  8:29 AM  Result Value Ref Range   Glucose-Capillary 82 70 - 99 mg/dL   Comment 1 Notify RN    Comment 2 Document in Chart   Glucose, capillary     Status: None   Collection Time: 07/08/20 11:34 AM  Result Value Ref Range   Glucose-Capillary 99 70 - 99 mg/dL   Comment 1 Notify RN    Comment 2 Document in Chart     No results found for this or any previous visit (from the past 240 hour(s)).   Radiology Studies: No results found. US EKG SITE RITE  Final Result    DG Chest 1 View  Final Result    DG Chest Port 1 View  Final Result    CT ANGIO CHEST PE W OR WO CONTRAST  Final Result  Addendum 1 of 1  ADDENDUM REPORT: 06/30/2020 15:17    ADDENDUM:  In addition to findings outlined in the previous report there is  dilation of the main pulmonary artery up to 3.2 cm which may  indicate pulmonary arterial hypertension.    Critical Value/emergent results were called by telephone at the time  of interpretation on 06/30/2020 at 3:16 pm to provider Canary BrimBRANDI OLLIS ,  who verbally acknowledged these results.      Electronically Signed    By: Donzetta KohutGeoffrey  Wile M.D.    On: 06/30/2020 15:17      Final    DG Chest Port 1 View  Final Result    DG CHEST PORT 1 VIEW  Final Result    VAS US LOWER EXTREMITY VENOUS (DVT)  Final Result    US Abdomen Limited RUQ (LIVER/GB)  Final Result    DG Chest 1 View  Final Result    US RENAL  Final Result    DG CHEST PORT 1 VIEW  Final Result    CT Head Wo Contrast  Final Result    DG Chest Portable 1 View  Final Result  Scheduled Meds: . (feeding supplement) PROSource Plus  30 mL Oral TID BM  . acetaZOLAMIDE  250 mg Oral BID  . furosemide  40 mg Intravenous BID  . magnesium oxide  800 mg Oral BID  . mouth rinse  15 mL Mouth Rinse BID  . multivitamin with minerals  1 tablet Oral Daily  . pantoprazole  40 mg Oral QHS  . Ensure Max Protein  11 oz Oral  BID  . Rivaroxaban  15 mg Oral BID WC   Followed by  . [START ON 07/22/2020] rivaroxaban  20 mg Oral Q supper   PRN Meds: docusate sodium, hydrocortisone cream, ipratropium-albuterol, lip balm, polyethylene glycol Continuous Infusions:   LOS: 13 days  Time spent: Greater than 50% of the 35 minute visit was spent in counseling/coordination of care for the patient as laid out in the A&P.   Lewie Chamber, MD [facility] 07/08/2020, 2:43 PM

## 2020-07-08 NOTE — Plan of Care (Signed)
  Problem: Health Behavior/Discharge Planning: Goal: Ability to manage health-related needs will improve Outcome: Progressing   Problem: Clinical Measurements: Goal: Ability to maintain clinical measurements within normal limits will improve Outcome: Progressing Goal: Will remain free from infection Outcome: Progressing Goal: Diagnostic test results will improve Outcome: Progressing Goal: Respiratory complications will improve Outcome: Progressing Goal: Cardiovascular complication will be avoided Outcome: Progressing   Problem: Coping: Goal: Level of anxiety will decrease Outcome: Progressing   Problem: Pain Managment: Goal: General experience of comfort will improve Outcome: Progressing   Problem: Safety: Goal: Ability to remain free from injury will improve Outcome: Progressing   

## 2020-07-09 ENCOUNTER — Encounter (HOSPITAL_COMMUNITY): Payer: Self-pay | Admitting: Internal Medicine

## 2020-07-09 LAB — CBC WITH DIFFERENTIAL/PLATELET
Abs Immature Granulocytes: 0.09 10*3/uL — ABNORMAL HIGH (ref 0.00–0.07)
Basophils Absolute: 0 10*3/uL (ref 0.0–0.1)
Basophils Relative: 0 %
Eosinophils Absolute: 0.2 10*3/uL (ref 0.0–0.5)
Eosinophils Relative: 3 %
HCT: 36.3 % (ref 36.0–46.0)
Hemoglobin: 10.1 g/dL — ABNORMAL LOW (ref 12.0–15.0)
Immature Granulocytes: 1 %
Lymphocytes Relative: 14 %
Lymphs Abs: 1 10*3/uL (ref 0.7–4.0)
MCH: 23.7 pg — ABNORMAL LOW (ref 26.0–34.0)
MCHC: 27.8 g/dL — ABNORMAL LOW (ref 30.0–36.0)
MCV: 85 fL (ref 80.0–100.0)
Monocytes Absolute: 0.5 10*3/uL (ref 0.1–1.0)
Monocytes Relative: 7 %
Neutro Abs: 5.6 10*3/uL (ref 1.7–7.7)
Neutrophils Relative %: 75 %
Platelets: 276 10*3/uL (ref 150–400)
RBC: 4.27 MIL/uL (ref 3.87–5.11)
RDW: 18.6 % — ABNORMAL HIGH (ref 11.5–15.5)
WBC: 7.5 10*3/uL (ref 4.0–10.5)
nRBC: 0 % (ref 0.0–0.2)

## 2020-07-09 LAB — BASIC METABOLIC PANEL
Anion gap: 6 (ref 5–15)
BUN: 6 mg/dL (ref 6–20)
CO2: 36 mmol/L — ABNORMAL HIGH (ref 22–32)
Calcium: 8 mg/dL — ABNORMAL LOW (ref 8.9–10.3)
Chloride: 96 mmol/L — ABNORMAL LOW (ref 98–111)
Creatinine, Ser: 0.72 mg/dL (ref 0.44–1.00)
GFR, Estimated: >60 mL/min (ref >60)
Glucose, Bld: 99 mg/dL (ref 70–99)
Potassium: 3.9 mmol/L (ref 3.5–5.1)
Sodium: 138 mmol/L (ref 135–145)

## 2020-07-09 LAB — MAGNESIUM: Magnesium: 1.7 mg/dL (ref 1.7–2.4)

## 2020-07-09 LAB — GLUCOSE, CAPILLARY
Glucose-Capillary: 98 mg/dL (ref 70–99)
Glucose-Capillary: 107 mg/dL — ABNORMAL HIGH (ref 70–99)
Glucose-Capillary: 111 mg/dL — ABNORMAL HIGH (ref 70–99)

## 2020-07-09 NOTE — Progress Notes (Signed)
Physical Therapy Treatment Patient Details Name: Felicia Warren MRN: [id number] DOB: [birth year] Today's Date: 07/09/2020    History of Present Illness Patient is a 50 year old female admitted with hypoglycemia and AMS, needing intubation on 5/4 and extubated on 5/8. Unsure of past medical history, patient currently living in [place] plans to move to [place] to be closer to family.    PT Comments    Pt in bed on 2 lts nasal at 95%.  Assisted OOB to recliner trial RA decreased to 85% with increased dyspnea/exersion.  Pt was able to self transfer with increased time and effort.  General transfer comment: pt self able to rise from bed by shifting her body weight forward holding onto recliner arm rests.  Pt had to "wait a minute" in standing before she was able to complete 180 degree turn from facing recliner to sitting in recliner.  Follow Up Recommendations  SNF     Equipment Recommendations       Recommendations for Other Services       Precautions / Restrictions Precautions Precautions: Fall Precaution Comments: bariatric Restrictions Weight Bearing Restrictions: No    Mobility  Bed Mobility Overal bed mobility: Needs Assistance Bed Mobility: Rolling;Sidelying to Sit Rolling: Supervision Sidelying to sit: Supervision       General bed mobility comments: pt self able with increased time and effort    Transfers Overall transfer level: Needs assistance Equipment used: None Transfers: Sit to/from Stand;Stand Pivot Transfers Sit to Stand: Supervision Stand pivot transfers: Supervision       General transfer comment: pt self able to rise from bed by shifting her body weight forward holding onto recliner arm rests.  Pt had to "wait a minute" in standing before she was able to complete 180 degree turn from facing recliner to sitting in recliner.  Ambulation/Gait             General Gait Details: transfers only this session   Stairs              Wheelchair Mobility    Modified Rankin (Stroke Patients Only)       Balance                                            Cognition Arousal/Alertness: Awake/alert Behavior During Therapy: WFL for tasks assessed/performed Overall Cognitive Status: Within Functional Limits for tasks assessed                                 General Comments: AxO x 3 pleasant      Exercises      General Comments        Pertinent Vitals/Pain Pain Assessment: No/denies pain    Home Living                      Prior Function            PT Goals (current goals can now be found in the care plan section) Progress towards PT goals: Progressing toward goals    Frequency    Min 3X/week      PT Plan Current plan remains appropriate    Co-evaluation              AM-PAC PT "6 Clicks" Mobility   Outcome Measure  Help needed  turning from your back to your side while in a flat bed without using bedrails?: A Little Help needed moving from lying on your back to sitting on the side of a flat bed without using bedrails?: A Little Help needed moving to and from a bed to a chair (including a wheelchair)?: A Little Help needed standing up from a chair using your arms (e.g., wheelchair or bedside chair)?: A Little Help needed to walk in hospital room?: A Little Help needed climbing 3-5 steps with a railing? : A Little 6 Click Score: 18    End of Session   Activity Tolerance: Patient tolerated treatment well Patient left: in chair;with call bell/phone within reach Nurse Communication: Mobility status PT Visit Diagnosis: Muscle weakness (generalized) (M62.81);Difficulty in walking, not elsewhere classified (R26.2)     Time: 7939-0300 PT Time Calculation (min) (ACUTE ONLY): 25 min  Charges:  $Therapeutic Activity: 23-37 mins                     Felecia Shelling  PTA Acute  Rehabilitation Services Pager      [phone] Office       [phone]

## 2020-07-09 NOTE — Plan of Care (Signed)
  Problem: Health Behavior/Discharge Planning: Goal: Ability to manage health-related needs will improve Outcome: Progressing   Problem: Clinical Measurements: Goal: Ability to maintain clinical measurements within normal limits will improve Outcome: Progressing Goal: Will remain free from infection Outcome: Progressing Goal: Diagnostic test results will improve Outcome: Progressing Goal: Respiratory complications will improve Outcome: Progressing Goal: Cardiovascular complication will be avoided Outcome: Progressing   Problem: Coping: Goal: Level of anxiety will decrease Outcome: Progressing   Problem: Pain Managment: Goal: General experience of comfort will improve Outcome: Progressing   

## 2020-07-09 NOTE — Progress Notes (Signed)
PROGRESS NOTE    Felicia Warren  ZOX:[id number] DOB: 08/09/1970 DOA: 06/25/2020 PCP: Pcp, No   Chief Complaint  Patient presents with  . unresponsive   Brief Narrative:  50 yo F who presented initially as Felicia Warren, found with altered mental status in [place] [facility] motel by staff. It is unknown when her last known well was. EMS found patient awake, but altered with [id number]. This was corrected, but she continued to have worsening mental status. Narcan did not provide any response. In ED she required nonrebreather mask and eventual intubation for hypoxic respiratory failure. Chest x-ray with probable multifocal pneumonia. AKI, elevated INR on admit. No prior history in [place] [place].Admitted by PCCM given requirement for airway protection/intubation.   5/4 presented to ED with hypoglycemia and AMS, required intubation, PCCM consult. CXR concerning for PNA. [facility]yn initiated. [facility] UDS neg, neg tylenol/ salicylates. CTH neg. Lactic acid 5.9->4.9  5/5 Hypotensive, responded to IVF + albumin, on heparin gtt but stopped due to platelet decline, not on pressors, requiring PEEP 10 / FiO2 80. RUQ US with cholelithiasis but no cholecystitis.   5/6 Vent needs decreased. LE venous duplex negative.   5/7 diarrhea > Placed flexiseal 5/8  5/8 WUA/SBT, extubated  Assessment & Plan:   Principal Problem:   Acute respiratory failure with hypoxia and hypercarbia (HCC) Active Problems:   Hypercapnic respiratory failure (HCC)   Elevated LFTs   Acute metabolic encephalopathy   Pulmonary emboli (HCC)  Acute on chronic hypoxic hypercarbic respiratory failure Probable OSA/OHS - Rapid response on 5/12 and 5/13 evenings due to inc WOB/desats - starting to tolerate spacing out BiPAP more; goal is to get on just nightly BiPAP (or none at all would be ideal) - saturation goal 88-92%, wean O2 accordingly - appreciate pulmonology following  again - continue lasix  - diamox added per pulmonology - follow up RD eval for possible calorie restrictions - seal of BiPAP also remains subpar at times  - will need outpatient sleep study  Acute Metabolic Encephalopathy  - etiology 2/2 chronic CO2 retention - 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 Needs an outpatient sleep study at some point - at risk for needing [name] trach if ongoing need for BiPAP intermittently as well as if she cannot lose some weight to help   Pulmonary Embolism - CT PE protocol from 5/9 with area suspicious for segmental thrombus in L lower lobe associated with aispace disease, likely pulm infarct - imaging with findings concerning for right heart strain - echo with mcconnell's sign (see report) - LE US negative for DVT -Transition to xarelto - discussed with pharmacy  AKI, undetermined acuity/unkown baseline, resolved AGMA/lactic acidosis, resolved Hypokalemia, resolved Contraction alkalosis - Trend BMP / urinary output - Avoid nephrotoxic agents - continue diamox; see discussion above   Social barriers - [place] and [place]; her [place] to return to [place] and [place] as she has family here, however she has nowhere here to live yet if she moved - not sure if family would take her in after this hospitalization but this has been brought up to them and it didn't appear to be an option - if she's needing nightly BIPAP, that will be [name] factor; and/or if she needs continuous O2 that will also be [name] factor - mobility extremely limited; PT following; staff needs to try and get her OOB to chair with meals as able - until these issues  are fixed and/or addressed, she will remain in the hospital Felicia Warren prolonged amount of time  Demand ischemia - Troponin downtrending with oxygenation - Echo =LVEF 55-60%, no WMA, McConnell's sign with moderately enlarged RV, interventricular septal flattening concerning for RV overload (likely due to OHS as  above) - Suspected LLL segmental PE as above - on xarelto  Elevated liver enzymes - resolving  - Cannot rule out shock liver given profound hypoxia -RUQ [place] negative. Acute hepatitis panel negative. - Labs downtrending appropriately  Hypoglycemia-resolved -Glucose in 40's on admit, no available hx -Resolved with dextrose infusion  Diarrhea, resolving -Likely in setting of TF + bowel regimen  -PRN colace, miralax  Anemia, normocytic - likely chronic anemia of chronic disease given above -trend CBC  -transfuse for Hgb <7% -follow anemia labs  DVT prophylaxis: xarelto Code Status: full  Family Communication: sisters at bedside Disposition:   Status is: Inpatient  Remains inpatient appropriate because:Inpatient level of care appropriate due to severity of illness   Dispo: The patient is from: Home              Anticipated d/c is to: pending              Patient currently is not medically stable to d/c.   Difficult to place patient Yes  Consultants:   Pulmonology  Procedures:  LE [place] Summary:  BILATERAL:  -No evidence of popliteal cyst, bilaterally.  RIGHT:  - There is no evidence of deep vein thrombosis in the lower extremity.  However, portions of this examination were limited- see technologist  comments above.    LEFT:  - There is no evidence of deep vein thrombosis in the lower extremity.  However, portions of this examination were limited- see technologist  comments above.     *See table(s) above for measurements and observations.   Echo IMPRESSIONS    1. Left ventricular ejection fraction, by estimation, is 55 to 60%. The  left ventricle has normal function. The left ventricle has no regional  wall motion abnormalities. Left ventricular diastolic parameters were  normal. There is the interventricular  septum is flattened in systole and diastole, consistent with right  ventricular pressure and volume overload.  2. McConnell's Sign  noted. Right ventricular systolic function is normal  in the base. The right ventricular size is moderately enlarged.  3. The mitral valve is grossly normal. No evidence of mitral valve  regurgitation.  4. The aortic valve is tricuspid. Aortic valve regurgitation is not  visualized. No aortic stenosis is present.  5. There is mild dilatation of the ascending aorta, measuring 41 mm.   Comparison(s): No prior Echocardiogram. Discussed finding with critical  care team.   Antimicrobials:  Anti-infectives (From admission, onward)   Start     Dose/Rate Route Frequency Ordered Stop   06/26/20 2000  cefTRIAXone (ROCEPHIN) 2 g in sodium chloride 0.9 % 100 mL IVPB  Status:  Discontinued        2 g 200 mL/hr over 30 Minutes Intravenous Every 24 hours 06/26/20 1818 07/01/20 1552   06/25/20 1900  Ampicillin-Sulbactam (UNASYN) 3 g in sodium chloride 0.9 % 100 mL IVPB  Status:  Discontinued        3 g 200 mL/hr over 30 Minutes Intravenous Every 12 hours 06/25/20 1857 06/26/20 1818   06/25/20 1700  cefTRIAXone (ROCEPHIN) 1 g in sodium chloride 0.9 % 100 mL IVPB        1 g 200 mL/hr over 30 Minutes Intravenous  Once 06/25/20 1649 06/25/20 1859   06/25/20 1700  azithromycin (ZITHROMAX) 500 mg in sodium chloride 0.9 % 250 mL IVPB        500 mg 250 mL/hr over 60 Minutes Intravenous  Once 06/25/20 1649 06/25/20 2005      Subjective: No new complaints  Objective: Vitals:   07/08/20 2059 07/09/20 0450 07/09/20 0900 07/09/20 1300  BP: (!) 118/55 (!) 101/53  135/75  Pulse: 95  88   Resp: (!) 22 20 15    Temp: 98.7 F (37.1 C) 99 F (37.2 C)  98 F (36.7 C)  TempSrc: Oral Axillary  Oral  SpO2: 95%  94%   Weight:  (!) 227.3 kg    Height:        Intake/Output Summary (Last 24 hours) at 07/09/2020 1541 Last data filed at 07/09/2020 1300 Gross per 24 hour  Intake 480 ml  Output 3250 ml  Net -2770 ml   Filed Weights   07/02/20 0433 07/05/20 0120 07/09/20 0450  Weight: (!) 244.9 kg (!) 246.8  kg (!) 227.3 kg    Examination:  General exam: Appears calm and comfortable, sitting in recliner.  Obese. Respiratory system: Clear to auscultation. Respiratory effort normal. Cardiovascular system: S1 & S2 heard, RRR. Gastrointestinal system: Abdomen is nondistended, soft and nontender Central nervous system: Alert and oriented. No focal neurological deficits. Extremities: bilateral LE edema Skin: No rashes, lesions or ulcers Psychiatry: Judgement and insight appear normal. Mood & affect appropriate.     Data Reviewed: I have personally reviewed following labs and imaging studies  CBC: Recent Labs  Lab 07/05/20 0415 07/06/20 0649 07/07/20 0412 07/08/20 0403 07/09/20 0354  WBC 12.9* 9.1 8.2 7.7 7.5  NEUTROABS 10.1* 7.0 5.9 5.7 5.6  HGB 10.1* 9.9* 10.1* 10.1* 10.1*  HCT 36.9 35.2* 35.9* 36.6 36.3  MCV 87.2 85.6 85.1 86.1 85.0  PLT 233 261 263 274 276    Basic Metabolic Panel: Recent Labs  Lab 07/05/20 0415 07/06/20 0649 07/07/20 0412 07/08/20 0403 07/09/20 0354  NA 140 140 141 140 138  K 3.4* 3.6 3.6 3.7 3.9  CL 99 99 96* 96* 96*  CO2 33* 35* 38* 37* 36*  GLUCOSE 90 91 85 91 99  BUN 9 7 7 6 6   CREATININE 0.73 0.64 0.67 0.79 0.72  CALCIUM 8.1* 7.9* 7.9* 7.9* 8.0*  MG 1.3* 1.6* 1.5* 1.6* 1.7    GFR: Estimated Creatinine Clearance: 166.1 mL/min (by C-G formula based on SCr of 0.72 mg/dL).  Liver Function Tests: Recent Labs  Lab 07/03/20 0356 07/04/20 0424 07/05/20 0415 07/06/20 0649  AST 41 38 42* 32  ALT 134* 105* 90* 69*  ALKPHOS 59 67 68 59  BILITOT 1.0 1.0 0.9 0.9  PROT 6.7 6.6 6.8 6.4*  ALBUMIN 2.9* 2.7* 2.7* 2.7*    CBG: Recent Labs  Lab 07/08/20 0829 07/08/20 1134 07/08/20 1542 07/09/20 0724 07/09/20 1107  GLUCAP 82 99 105* 98 107*     No results found for this or any previous visit (from the past 240 hour(s)).       Radiology Studies: No results found.      Scheduled Meds: . (feeding supplement) PROSource Plus  30 mL  Oral TID BM  . acetaZOLAMIDE  250 mg Oral BID  . furosemide  40 mg Intravenous BID  . magnesium oxide  800 mg Oral BID  . mouth rinse  15 mL Mouth Rinse BID  . multivitamin with minerals  1 tablet Oral Daily  . pantoprazole  40 mg Oral QHS  . Ensure Max Protein  11 oz Oral BID  . Rivaroxaban  15 mg Oral BID WC   Followed by  . [START ON 07/22/2020] rivaroxaban  20 mg Oral Q supper   Continuous Infusions:   LOS: 14 days    Time spent: over 30 min    Lacretia Nicks, MD [facility]   To contact the attending provider between 7A-7P or the covering provider during after hours 7P-7A, please log into the web site [email or web address] and access using universal [place] password for that web site. If you do not have the password, please call the hospital operator.  07/09/2020, 3:41 PM

## 2020-07-09 NOTE — Plan of Care (Signed)

## 2020-07-09 NOTE — Progress Notes (Signed)
   NAME:  Felicia Warren, MRN:  [id number], DOB:  October 24, 1970, LOS: 14 ADMISSION DATE:  06/25/2020, CONSULTATION DATE:  07/09/20 REFERRING MD:  EDP, CHIEF COMPLAINT:  AMS   History of Present Illness:  50 y/o F who presented initially as a Felicia Warren, found with altered mental status in a motel by staff.  It is unknown when her last known well was.  EMS found patient awake, but altered with a glucose of 40.  This was corrected, but she continued to have worsening mental status.  Narcan did not provide any response.  In ED she required nonrebreather mask and eventual intubation for hypoxic respiratory failure.  Chest x-ray with probable multifocal pneumonia.  AKI, elevated INR on admit.  No prior history in epic and no known emergency contact. Work up consistent with RV overload, suspected OHS / OSA, LLL segmental PE on heparin.  AKI resolved.   Pertinent  Medical History  Obesity ?  OSA-undiagnosed  Significant Hospital Events: Including procedures, antibiotic start and stop dates in addition to other pertinent events    5/4 presented to ED with hypoglycemia and AMS, required intubation, PCCM consult. CXR concerning for PNA.  Unasyn initiated. Tx to [facility] for beds. UDS neg, neg tylenol/ salicylates. CTH neg. Lactic acid 5.9-> 4.9 . 5/5 Hypotensive, responded to IVF + albumin, on heparin gtt but stopped due to platelet decline, not on pressors, requiring PEEP 10 / FiO2 80. RUQ [place] with cholelithiasis but no cholecystitis.   Marland Kitchen 5/6 Vent needs decreased. LE venous duplex negative.  . 5/7 diarrhea > Placed flexiseal 5/8  5/8 WUA/SBT, extubated  5/9 CTA Chest with LLL segmental PE with associated airspace disease and pulmonary infarct, dilation of the main pulmonary artery up to 3.2cm.  To TRH.   5/16 started standing diamox   More alert and interactive, tolerating supplemental oxygen  Interim History / Subjective:  No distress  Has no complaints about shortness of breath, no chest pains or chest  discomfort  Objective   Blood pressure (!) 101/53, pulse 95, temperature 99 F (37.2 C), temperature source Axillary, resp. rate 20, height 5\' 6"  (1.676 m), weight (!) 227.3 kg, SpO2 95 %.        Intake/Output Summary (Last 24 hours) at 07/09/2020 0900 Last data filed at 07/09/2020 0451 Gross per 24 hour  Intake 240 ml  Output 3350 ml  Net -3110 ml   Filed Weights   07/02/20 0433 07/05/20 0120 07/09/20 0450  Weight: (!) 244.9 kg (!) 246.8 kg (!) 227.3 kg   Exam:  General-middle-age, does not appear to be in distress HENT: Moist oral mucosa, nonicteric Pulm: Clear breath sounds bilaterally, decreased air movement Card: S1-S2 appreciated, no murmur Abdomen: Soft, nontender Extremities: Warm and dry Neurologically: Awake and interactive, nonfocal  Labs/imaging that I havepersonally reviewed  (right click and "Reselect all SmartList Selections" daily)  Labs reviewed Bicarb of 36 trending down  Resolved Hospital Problem list   Hypotension / Shock  Thrombocytopenia AKI Acute metabolic (hypercarbic related) encephalopathy  Demand ischemia  Hypoglycemia  Assessment & Plan:   Acute on chronic respiratory failure Acute on chronic cor pulmonale Severe undiagnosed obstructive sleep apnea/obesity hypoventilation syndrome Pulmonary embolism  -Clinically improving -Tolerating BiPAP at night -Continue anticoagulation -Continue oxygen supplementation -Continue cautious diuresis-on Diamox  Will need a sleep study as outpatient   07/11/20, MD [place] PCCM Pager: [phone]

## 2020-07-10 LAB — COMPREHENSIVE METABOLIC PANEL
ALT: 32 U/L (ref 0–44)
AST: 28 U/L (ref 15–41)
Albumin: 2.4 g/dL — ABNORMAL LOW (ref 3.5–5.0)
Alkaline Phosphatase: 53 U/L (ref 38–126)
Anion gap: 5 (ref 5–15)
BUN: 6 mg/dL (ref 6–20)
CO2: 36 mmol/L — ABNORMAL HIGH (ref 22–32)
Calcium: 8.1 mg/dL — ABNORMAL LOW (ref 8.9–10.3)
Chloride: 98 mmol/L (ref 98–111)
Creatinine, Ser: 0.8 mg/dL (ref 0.44–1.00)
GFR, Estimated: >60 mL/min (ref >60)
Glucose, Bld: 90 mg/dL (ref 70–99)
Potassium: 3.5 mmol/L (ref 3.5–5.1)
Sodium: 139 mmol/L (ref 135–145)
Total Bilirubin: 0.9 mg/dL (ref 0.3–1.2)
Total Protein: 6.2 g/dL — ABNORMAL LOW (ref 6.5–8.1)

## 2020-07-10 LAB — BLOOD GAS, VENOUS
Acid-Base Excess: 6.9 mmol/L — ABNORMAL HIGH (ref 0.0–2.0)
Acid-Base Excess: 10.7 mmol/L — ABNORMAL HIGH (ref 0.0–2.0)
Bicarbonate: 35.5 mmol/L — ABNORMAL HIGH (ref 20.0–28.0)
Bicarbonate: 37 mmol/L — ABNORMAL HIGH (ref 20.0–28.0)
O2 Saturation: 43.4 %
O2 Saturation: 82.5 %
Patient temperature: 98.6
Patient temperature: 98.6
pCO2, Ven: 77.6 mmHg (ref 44.0–60.0)
pCO2, Ven: 61 mmHg — ABNORMAL HIGH (ref 44.0–60.0)
pH, Ven: 7.282 (ref 7.250–7.430)
pH, Ven: 7.399 (ref 7.250–7.430)
pO2, Ven: <31 mmHg — CL (ref 32.0–45.0)
pO2, Ven: 48.6 mmHg — ABNORMAL HIGH (ref 32.0–45.0)

## 2020-07-10 LAB — CBC WITH DIFFERENTIAL/PLATELET
Abs Immature Granulocytes: 0.07 10*3/uL (ref 0.00–0.07)
Basophils Absolute: 0 10*3/uL (ref 0.0–0.1)
Basophils Relative: 0 %
Eosinophils Absolute: 0.2 10*3/uL (ref 0.0–0.5)
Eosinophils Relative: 4 %
HCT: 35.9 % — ABNORMAL LOW (ref 36.0–46.0)
Hemoglobin: 10 g/dL — ABNORMAL LOW (ref 12.0–15.0)
Immature Granulocytes: 1 %
Lymphocytes Relative: 16 %
Lymphs Abs: 0.9 10*3/uL (ref 0.7–4.0)
MCH: 23.5 pg — ABNORMAL LOW (ref 26.0–34.0)
MCHC: 27.9 g/dL — ABNORMAL LOW (ref 30.0–36.0)
MCV: 84.5 fL (ref 80.0–100.0)
Monocytes Absolute: 0.5 10*3/uL (ref 0.1–1.0)
Monocytes Relative: 9 %
Neutro Abs: 4.3 10*3/uL (ref 1.7–7.7)
Neutrophils Relative %: 70 %
Platelets: 217 10*3/uL (ref 150–400)
RBC: 4.25 MIL/uL (ref 3.87–5.11)
RDW: 18.5 % — ABNORMAL HIGH (ref 11.5–15.5)
WBC: 6.1 10*3/uL (ref 4.0–10.5)
nRBC: 0 % (ref 0.0–0.2)

## 2020-07-10 LAB — FOLATE: Folate: 6.6 ng/mL (ref >5.9)

## 2020-07-10 LAB — MAGNESIUM: Magnesium: 1.7 mg/dL (ref 1.7–2.4)

## 2020-07-10 LAB — PROTIME-INR
INR: 2.4 — ABNORMAL HIGH (ref 0.8–1.2)
Prothrombin Time: 26.1 seconds — ABNORMAL HIGH (ref 11.4–15.2)

## 2020-07-10 LAB — GLUCOSE, CAPILLARY
Glucose-Capillary: 91 mg/dL (ref 70–99)
Glucose-Capillary: 118 mg/dL — ABNORMAL HIGH (ref 70–99)
Glucose-Capillary: 94 mg/dL (ref 70–99)

## 2020-07-10 LAB — IRON AND TIBC
Iron: 50 ug/dL (ref 28–170)
Saturation Ratios: 19 % (ref 10.4–31.8)
TIBC: 265 ug/dL (ref 250–450)
UIBC: 215 ug/dL

## 2020-07-10 LAB — VITAMIN B12: Vitamin B-12: 742 pg/mL (ref 180–914)

## 2020-07-10 LAB — PHOSPHORUS: Phosphorus: 4.4 mg/dL (ref 2.5–4.6)

## 2020-07-10 LAB — FERRITIN: Ferritin: 91 ng/mL (ref 11–307)

## 2020-07-10 NOTE — Progress Notes (Signed)
Occupational Therapy Treatment Patient Details Name: Felicia Warren MRN: [id number] DOB: December 15, 1970 Today's Date: 07/10/2020    History of present illness Patient is a 50 year old female admitted with hypoglycemia and AMS, needing intubation on 5/4 and extubated on 5/8. Unsure of past medical history, patient currently living in [place] plans to move to [place] to be closer to family.   OT comments  Pt progressing towards OT goals this session. She is able to complete transfers at min guard +2 safety as well as short ambulation with (and without) RW in room. She is able to stand multiple times and maintain standing for peri care and application of barrier cream and hydrocortisone cream (by RN). Pt down to 81% on RA, requires 3L of O2 during activity to maintain SpO2 at or above 90%, 2L at rest, max HR observed was 123, resting between 90-105. Pt is max A for rear peri care in standing - she says that family will assist upon dc. Total A for LB dressing. OT will continue to follow acutely. Mobility is much improved this session.    Follow Up Recommendations  Home health OT;Supervision - Intermittent (if plan is to go home with family)    Equipment Recommendations  3 in 1 bedside commode (Bari size)    Recommendations for Other Services      Precautions / Restrictions Precautions Precautions: Fall Precaution Comments: bariatric Restrictions Weight Bearing Restrictions: No       Mobility Bed Mobility Overal bed mobility: Needs Assistance Bed Mobility: Rolling;Sidelying to Sit Rolling: Supervision Sidelying to sit: Supervision       General bed mobility comments: pt self able with increased time and effort    Transfers Overall transfer level: Needs assistance Equipment used: None Transfers: Sit to/from Stand;Stand Pivot Transfers Sit to Stand: Supervision;Min guard Stand pivot transfers: Supervision;Min guard       General transfer comment: at supervision/min guard  level Pt able to rise from bed by shifting her body weight forward in flexed position holding onto RW, and grab bar in bathroom. Pt stands flexed to "rest" and then comes to full upright for mobility.    Balance Overall balance assessment: Needs assistance Sitting-balance support: Feet supported Sitting balance-Leahy Scale: Good     Standing balance support: Single extremity supported;Bilateral upper extremity supported Standing balance-Leahy Scale: Fair Standing balance comment: able to maintain standing for front and rear peri care, takes rest breaks WB through BUE and flexed position with BUE support                           ADL either performed or assessed with clinical judgement   ADL Overall ADL's : Needs assistance/impaired Eating/Feeding: Set up   Grooming: Set up;Sitting Grooming Details (indicate cue type and reason): while on toilet         Upper Body Dressing : Minimal assistance;Sitting Upper Body Dressing Details (indicate cue type and reason): donned new gown - assist for line management Lower Body Dressing: Total assistance;Bed level Lower Body Dressing Details (indicate cue type and reason): don socks Toilet Transfer: Min guard;+2 for safety/equipment;Ambulation;RW;Regular Toilet;Grab bars Toilet Transfer Details (indicate cue type and reason): able to ambulate into regular toilet with Boykin Nearing RW, hyper flexion to get weight fwd prior to coming up to standing Toileting- Clothing Manipulation and Hygiene: Moderate assistance;Sit to/from stand Toileting - Clothing Manipulation Details (indicate cue type and reason): Pt able to perform front peri care, needing total A for rear  peri care - multiple skin breaks noted - RN aware and barrier cream applied     Functional mobility during ADLs: Min guard;+2 for safety/equipment;Rolling walker General ADL Comments: Pt continues to require 3L of supplemental O2 to maintain SpO2 >90%; not on oxygen previously      Vision       Perception     Praxis      Cognition Arousal/Alertness: Awake/alert Behavior During Therapy: WFL for tasks assessed/performed Overall Cognitive Status: Impaired/Different from baseline Area of Impairment: Awareness;Problem solving                           Awareness: Emergent Problem Solving: Requires verbal cues General Comments: pleasant, motivated, requires cues for problem solving and ADL completion        Exercises     Shoulder Instructions       General Comments Pt desaturated to 81% on RA, required 3L O2 during activity to maintain SpO2 at 90% or higher, once seated Pt able to maintain SpO2 >90 at 2L; highest HR observed was 123 during activity, resting post-activity was 105 and after prolongued sitting it was 93 BPM    Pertinent Vitals/ Pain       Pain Assessment: No/denies pain Faces Pain Scale: No hurt Pain Intervention(s): Monitored during session  Home Living                                          Prior Functioning/Environment              Frequency  Min 2X/week        Progress Toward Goals  OT Goals(current goals can now be found in the care plan section)  Progress towards OT goals: Progressing toward goals  Acute Rehab OT Goals Patient Stated Goal: get better OT Goal Formulation: With patient Time For Goal Achievement: 07/15/20 Potential to Achieve Goals: Good  Plan Discharge plan remains appropriate    Co-evaluation    PT/OT/SLP Co-Evaluation/Treatment: Yes Reason for Co-Treatment: Necessary to address cognition/behavior during functional activity;For patient/therapist safety;To address functional/ADL transfers PT goals addressed during session: Mobility/safety with mobility;Balance;Proper use of DME OT goals addressed during session: ADL's and self-care;Proper use of Adaptive equipment and DME      AM-PAC OT "6 Clicks" Daily Activity     Outcome Measure   Help from another  person eating meals?: None Help from another person taking care of personal grooming?: A Little Help from another person toileting, which includes using toliet, bedpan, or urinal?: A Lot Help from another person bathing (including washing, rinsing, drying)?: A Lot Help from another person to put on and taking off regular upper body clothing?: A Little Help from another person to put on and taking off regular lower body clothing?: Total 6 Click Score: 15    End of Session Equipment Utilized During Treatment: Oxygen;Rolling walker (Bari, 3-2 L O2)  OT Visit Diagnosis: Other abnormalities of gait and mobility (R26.89);Muscle weakness (generalized) (M62.81);Other symptoms and signs involving cognitive function   Activity Tolerance Patient tolerated treatment well   Patient Left in chair;with call bell/phone within reach;with chair alarm set   Nurse Communication Mobility status        Time: 1610-9604 OT Time Calculation (min): 41 min  Charges: OT General Charges $OT Visit: 1 Visit OT Treatments $Self Care/Home Management : 8-22 mins  Vernona Rieger  H OTR/L Acute Rehabilitation Services Pager: [phone] Office: [phone]   [name] [facility] 07/10/2020, 10:47 AM

## 2020-07-10 NOTE — Progress Notes (Signed)
   NAME:  Felicia Warren, MRN:  [id number], DOB:  1970-05-12, LOS: 15 ADMISSION DATE:  06/25/2020, CONSULTATION DATE:  07/10/20 REFERRING MD:  EDP, CHIEF COMPLAINT:  AMS   History of Present Illness:  50 y/o F who presented initially as a [name], found with altered mental status in a motel by staff.  It is unknown when her last known well was.  EMS found patient awake, but altered with a glucose of 40.  This was corrected, but she continued to have worsening mental status.  Narcan did not provide any response.  In ED she required nonrebreather mask and eventual intubation for hypoxic respiratory failure.  Chest x-ray with probable multifocal pneumonia.  AKI, elevated INR on admit.  No prior history in epic and no known emergency contact. Work up consistent with RV overload, suspected OHS / OSA, LLL segmental PE on heparin.  AKI resolved.   Pertinent  Medical History  Obesity ?  OSA-undiagnosed  Significant Hospital Events: Including procedures, antibiotic start and stop dates in addition to other pertinent events    5/4 presented to ED with hypoglycemia and AMS, required intubation, PCCM consult. CXR concerning for PNA.  Unasyn initiated. Tx to [facility] for beds. UDS neg, neg tylenol/ salicylates. CTH neg. Lactic acid 5.9-> 4.9 . 5/5 Hypotensive, responded to IVF + albumin, on heparin gtt but stopped due to platelet decline, not on pressors, requiring PEEP 10 / FiO2 80. RUQ [place] with cholelithiasis but no cholecystitis.   Marland Kitchen 5/6 Vent needs decreased. LE venous duplex negative.  . 5/7 diarrhea > Placed flexiseal 5/8  5/8 WUA/SBT, extubated  5/9 CTA Chest with LLL segmental PE with associated airspace disease and pulmonary infarct, dilation of the main pulmonary artery up to 3.2cm.  To TRH.   5/16 started standing diamox   More alert and interactive, tolerating supplemental oxygen  Interim History / Subjective:  No overnight events Slept well Denies any chest pain or chest discomfort Objective    Blood pressure 130/70, pulse (!) 106, temperature 98.4 F (36.9 C), temperature source Oral, resp. rate 20, height 5\' 6"  (1.676 m), weight (!) 228.6 kg, SpO2 97 %.        Intake/Output Summary (Last 24 hours) at 07/10/2020 1448 Last data filed at 07/10/2020 1000 Gross per 24 hour  Intake 360 ml  Output 2325 ml  Net -1965 ml   Filed Weights   07/05/20 0120 07/09/20 0450 07/10/20 0500  Weight: (!) 246.8 kg (!) 227.3 kg (!) 228.6 kg   Exam:  Middle-age lady, does not appear to be in distress, morbidly obese HENT: Moist oral mucosa Pulm: Clear breath sounds, decreased air movement at the bases Card: S1-S2 appreciated Abdomen: Soft, nontender Extremities: Warm and dry Neurologically: Awake and interactive, nonfocal  Labs/imaging that I havepersonally reviewed  (right click and "Reselect all SmartList Selections" daily)  Labs reviewed Bicarb of 36 trending down-stable  Resolved Hospital Problem list   Hypotension / Shock  Thrombocytopenia AKI Acute metabolic (hypercarbic related) encephalopathy  Demand ischemia  Hypoglycemia  Assessment & Plan:   Acute on chronic respiratory failure Severe undiagnosed obstructive sleep apnea/obesity hypoventilation syndrome Pulmonary embolism Acute on chronic cor pulmonale  Continue BiPAP at night Continue anticoagulation Continue oxygen supplementation On Diamox for diuresis  Patient will require an outpatient sleep study  Discharge planning can be initiated  07/12/20, MD [place] P[facility]M Pager: [phone]

## 2020-07-10 NOTE — Progress Notes (Signed)
Walked into pt room and notice nose bleeding.  Pt given cold washcloth and pressure held x .  Bleeding noted while pt on bipap which was removed by pt.  No aspiration per pt.  Pt is doing well.  Pt does not want Mitchell on at this time. O2 sats holding steady around 92-95%.  Will monitor and note changes.

## 2020-07-10 NOTE — TOC Progression Note (Signed)
Transition of Care [facility]) - Progression Note    Patient Details  Name: Felicia Warren MRN: [id number] Date of Birth: 1970-05-24  [facility]) CM/SW Contact  Geni Bers, RN Phone Number: 07/10/2020, 1:53 PM  Clinical Narrative:     Spoke with pt's sister today concerning discharge plan. She is going to apply for emergency Medicaid.  Expected Discharge Plan: Homeless Shelter Barriers to Discharge: Continued Medical Work up  Expected Discharge Plan and Services Expected Discharge Plan: Homeless Shelter       Living arrangements for the past 2 months: Hotel/Motel                                       Social Determinants of Health (SDOH) Interventions    Readmission Risk Interventions No flowsheet data found.

## 2020-07-10 NOTE — Progress Notes (Signed)
Physical Therapy Treatment Patient Details Name: Felicia Warren MRN: [id number] DOB: 1970/05/13 Today's Date: 07/10/2020    History of Present Illness Patient is a 50 year old female admitted with hypoglycemia and AMS, needing intubation on 5/4 and extubated on 5/8. Unsure of past medical history, patient currently living in [place] plans to move to [place] to be closer to family.    PT Comments    Pt in bed on 2 lts sats at rest 95%.  Trial removed to monitor RA.  Assisted OOB to amb to bathroom.  General transfer comment: at supervision/min guard level Pt able to rise from bed by shifting her body weight forward in flexed position holding onto RW, and grab bar in bathroom. Pt stands flexed to "rest" and then comes to full upright for mobility.General Gait Details: pt was able to amb to and from bathroom with use of Bariatric walker.  RA decreased to 83% with 2/4 Dyspnea and HR 127.  Reapplied 3 lts to achieve sats >90% with activity. General comments (skin integrity, edema, etc.): Pt desaturated to 81% on RA, required 3L O2 during activity to maintain SpO2 at 90% or higher, once seated Pt able to maintain SpO2 >90 at 2L; highest HR observed was 123 during activity, resting post-activity was 105 and after prolongued sitting it was 93 BPM  Follow Up Recommendations  SNF (pt declines SNF)   AxO x 3 and pleasant but not willing to share her D/C plans or straight up tell us who will be caring for her at home     Equipment Recommendations  Rolling walker with 5" wheels (Bariatric)    Recommendations for Other Services       Precautions / Restrictions Precautions Precautions: Fall Precaution Comments: bariatric Restrictions Weight Bearing Restrictions: No    Mobility  Bed Mobility Overal bed mobility: Needs Assistance Bed Mobility: Rolling;Sidelying to Sit Rolling: Supervision Sidelying to sit: Supervision       General bed mobility comments: pt self able with increased time and  effort    Transfers Overall transfer level: Needs assistance Equipment used: None Transfers: Sit to/from Stand;Stand Pivot Transfers Sit to Stand: Supervision;Min guard Stand pivot transfers: Supervision;Min guard       General transfer comment: at supervision/min guard level Pt able to rise from bed by shifting her body weight forward in flexed position holding onto RW, and grab bar in bathroom. Pt stands flexed to "rest" and then comes to full upright for mobility.  Ambulation/Gait Ambulation/Gait assistance: Min guard Gait Distance (Feet): 40 Feet (20 feet x 2 to and from bathroom) Assistive device: Rolling walker (2 wheeled) (bariatric) Gait Pattern/deviations: Step-to pattern;Decreased stride length;Shuffle Gait velocity: decreased   General Gait Details: pt was able to amb to and from bathroom with use of Bariatric walker.  RA decreased to 83% with 2/4 Dyspnea and HR 127.  Reapplied 3 lts to achieve sats >90% with activity.   Stairs             Wheelchair Mobility    Modified Rankin (Stroke Patients Only)       Balance Overall balance assessment: Needs assistance Sitting-balance support: Feet supported Sitting balance-Leahy Scale: Good     Standing balance support: Single extremity supported;Bilateral upper extremity supported Standing balance-Leahy Scale: Fair Standing balance comment: able to maintain standing for front and rear peri care, takes rest breaks WB through BUE and flexed position with BUE support  Cognition Arousal/Alertness: Awake/alert Behavior During Therapy: WFL for tasks assessed/performed Overall Cognitive Status: Within Functional Limits for tasks assessed Area of Impairment: Awareness;Problem solving                           Awareness: Emergent Problem Solving: Requires verbal cues General Comments: AxO x 3 and pleasant but not willing to share her D/C plans or straight up tell us  who will be caring for her at home      Exercises      General Comments General comments (skin integrity, edema, etc.): Pt desaturated to 81% on RA, required 3L O2 during activity to maintain SpO2 at 90% or higher, once seated Pt able to maintain SpO2 >90 at 2L; highest HR observed was 123 during activity, resting post-activity was 105 and after prolongued sitting it was 93 BPM      Pertinent Vitals/Pain Pain Assessment: No/denies pain Faces Pain Scale: No hurt Pain Intervention(s): Monitored during session    Home Living                      Prior Function            PT Goals (current goals can now be found in the care plan section) Acute Rehab PT Goals Patient Stated Goal: get better Progress towards PT goals: Progressing toward goals    Frequency    Min 3X/week      PT Plan Current plan remains appropriate    Co-evaluation PT/OT/SLP Co-Evaluation/Treatment: Yes Reason for Co-Treatment: Complexity of the patient's impairments (multi-system involvement);For patient/therapist safety PT goals addressed during session: Mobility/safety with mobility OT goals addressed during session: ADL's and self-care;Proper use of Adaptive equipment and DME      AM-PAC PT "6 Clicks" Mobility   Outcome Measure  Help needed turning from your back to your side while in a flat bed without using bedrails?: A Little Help needed moving from lying on your back to sitting on the side of a flat bed without using bedrails?: A Little Help needed moving to and from a bed to a chair (including a wheelchair)?: A Little Help needed standing up from a chair using your arms (e.g., wheelchair or bedside chair)?: A Little Help needed to walk in hospital room?: A Little Help needed climbing 3-5 steps with a railing? : A Little 6 Click Score: 18    End of Session Equipment Utilized During Treatment: Gait belt;Oxygen Activity Tolerance: Patient tolerated treatment well Patient left: in  chair;with call bell/phone within reach Nurse Communication: Mobility status PT Visit Diagnosis: Muscle weakness (generalized) (M62.81);Difficulty in walking, not elsewhere classified (R26.2)     Time: 7340-3709 PT Time Calculation (min) (ACUTE ONLY): 30 min  Charges:  $Gait Training: 8-22 mins $Therapeutic Activity: 8-22 mins                     Felecia Shelling  PTA Acute  Rehabilitation Services Pager      [phone] Office      [phone]

## 2020-07-10 NOTE — Progress Notes (Signed)
PROGRESS NOTE    Felicia Warren  BWL:[id number] DOB: 12/24/1970 DOA: 06/25/2020 PCP: Pcp, No   Chief Complaint  Patient presents with  . unresponsive   Brief Narrative:  50 yo F who presented initially as Felicia Warren, found with altered mental status in Felicia [facility] motel by staff. It is unknown when her last known well was. EMS found patient awake, but altered with [id number]. This was corrected, but she continued to have worsening mental status. Narcan did not provide any response. In ED she required nonrebreather mask and eventual intubation for hypoxic respiratory failure. Chest x-ray with probable multifocal pneumonia. AKI, elevated INR on admit. No prior history in epicas she is from [facility] no known emergency contact.Admitted by PCCM given requirement for airway protection/intubation.   5/4 presented to ED with hypoglycemia and AMS, required intubation, PCCM consult. CXR concerning for PNA. Unasyn initiated. Tx to [facility] for beds. UDS neg, neg tylenol/ salicylates. CTH neg. Lactic acid 5.9->4.9  5/5 Hypotensive, responded to IVF + albumin, on heparin gtt but stopped due to platelet decline, not on pressors, requiring PEEP 10 / FiO2 80. RUQ [place] with cholelithiasis but no cholecystitis.   5/6 Vent needs decreased. LE venous duplex negative.   5/7 diarrhea > Placed flexiseal 5/8  5/8 WUA/SBT, extubated  Assessment & Plan:   Principal Problem:   Acute respiratory failure with hypoxia and hypercarbia (HCC) Active Problems:   Hypercapnic respiratory failure (HCC)   Elevated LFTs   Acute metabolic encephalopathy   Pulmonary emboli (HCC)  Acute on chronic hypoxic hypercarbic respiratory failure Probable OSA/OHS - Rapid response on 5/12 and 5/13 evenings due to inc WOB/desats - starting to tolerate spacing out BiPAP more; goal is to get on just nightly BiPAP (or none at all would be ideal) - saturation goal 88-92%, wean O2 accordingly - appreciate pulmonology following  again - continue lasix  - diamox added per pulmonology - follow up RD eval for possible calorie restrictions - seal of BiPAP also remains subpar at times  - will need outpatient sleep study  Acute Metabolic Encephalopathy  - etiology 2/2 chronic CO2 retention - follow vbg today, Felicia Warren little more drowsy than i'd expect during my evaulation - 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 Needs an outpatient sleep study at some point - at risk for needing Traeger Sultana trach if ongoing need for BiPAP intermittently as well as if she cannot lose some weight to help   Pulmonary Embolism - CT PE protocol from 5/9 with area suspicious for segmental thrombus in L lower lobe associated with aispace disease, likely pulm infarct - imaging with findings concerning for right heart strain - echo with mcconnell's sign (see report) - LE [place] negative for DVT -Transition to xarelto - discussed with pharmacy  AKI, undetermined acuity/unkown baseline, resolved AGMA/lactic acidosis, resolved Hypokalemia, resolved Contraction alkalosis - Trend BMP / urinary output - Avoid nephrotoxic agents - continue diamox; see discussion above   Social barriers - patient from [place] and uninsured; her wish is to return to [place] to collect belongings and move to [place] as she has family here, however she has nowhere here to live yet if she moved - not sure if family would take her in after this hospitalization but this has been brought up to them and it didn't appear to be an option - if she's needing nightly BIPAP, that will be Felicia Warren factor; and/or if she needs continuous O2 that will also be Felicia Warren See factor - mobility extremely limited; PT following; staff needs to try  and get her OOB to chair with meals as able - until these issues are fixed and/or addressed, she will remain in the hospital Felicia Warren prolonged amount of time  Demand ischemia - Troponin downtrending with oxygenation - Echo =LVEF 55-60%, no WMA, McConnell's sign with moderately enlarged RV,  interventricular septal flattening concerning for RV overload (likely due to OHS as above) - Suspected LLL segmental PE as above - on xarelto  Elevated liver enzymes - resolving  - Cannot rule out shock liver given profound hypoxia -RUQ US negative. Acute hepatitis panel negative. - Labs downtrending appropriately  Hypoglycemia-resolved -Glucose in 40's on admit, no available hx -Resolved with dextrose infusion  Diarrhea, resolving -Likely in setting of TF + bowel regimen  -PRN colace, miralax  Anemia, normocytic - likely chronic anemia of chronic disease given above -trend CBC  -transfuse for Hgb <7% -follow anemia labs - c/w aocd  DVT prophylaxis: xarelto Code Status: full  Family Communication: sisters at bedside Disposition:   Status is: Inpatient  Remains inpatient appropriate because:Inpatient level of care appropriate due to severity of illness   Dispo: The patient is from: Home              Anticipated d/c is to: pending              Patient currently is not medically stable to d/c.   Difficult to place patient Yes  Consultants:   Pulmonology  Procedures:  LE US Summary:  BILATERAL:  -No evidence of popliteal cyst, bilaterally.  RIGHT:  - There is no evidence of deep vein thrombosis in the lower extremity.  However, portions of this examination were limited- see technologist  comments above.    LEFT:  - There is no evidence of deep vein thrombosis in the lower extremity.  However, portions of this examination were limited- see technologist  comments above.     *See table(s) above for measurements and observations.   Echo IMPRESSIONS    1. Left ventricular ejection fraction, by estimation, is 55 to 60%. The  left ventricle has normal function. The left ventricle has no regional  wall motion abnormalities. Left ventricular diastolic parameters were  normal. There is the interventricular  septum is flattened in systole and  diastole, consistent with right  ventricular pressure and volume overload.  2. McConnell's Sign noted. Right ventricular systolic function is normal  in the base. The right ventricular size is moderately enlarged.  3. The mitral valve is grossly normal. No evidence of mitral valve  regurgitation.  4. The aortic valve is tricuspid. Aortic valve regurgitation is not  visualized. No aortic stenosis is present.  5. There is mild dilatation of the ascending aorta, measuring 41 mm.   Comparison(s): No prior Echocardiogram. Discussed finding with critical  care team.   Antimicrobials:  Anti-infectives (From admission, onward)   Start     Dose/Rate Route Frequency Ordered Stop   06/26/20 2000  cefTRIAXone (ROCEPHIN) 2 g in sodium chloride 0.9 % 100 mL IVPB  Status:  Discontinued        2 g 200 mL/hr over 30 Minutes Intravenous Every 24 hours 06/26/20 1818 07/01/20 1552   06/25/20 1900  Ampicillin-Sulbactam (UNASYN) 3 g in sodium chloride 0.9 % 100 mL IVPB  Status:  Discontinued        3 g 200 mL/hr over 30 Minutes Intravenous Every 12 hours 06/25/20 1857 06/26/20 1818   06/25/20 1700  cefTRIAXone (ROCEPHIN) 1 g in sodium chloride 0.9 % 100 mL  IVPB        1 g 200 mL/hr over 30 Minutes Intravenous  Once 06/25/20 1649 06/25/20 1859   06/25/20 1700  azithromycin (ZITHROMAX) 500 mg in sodium chloride 0.9 % 250 mL IVPB        500 mg 250 mL/hr over 60 Minutes Intravenous  Once 06/25/20 1649 06/25/20 2005      Subjective: No new complaints Seems sleepy  Objective: Vitals:   07/09/20 2000 07/10/20 0500 07/10/20 0631 07/10/20 1300  BP: (!) 120/56  126/64 130/70  Pulse:   (!) 106   Resp:   20   Temp: 99.3 F (37.4 C)  99.2 F (37.3 C) 98.4 F (36.9 C)  TempSrc: Oral  Oral Oral  SpO2: 97%     Weight:  (!) 228.6 kg    Height:        Intake/Output Summary (Last 24 hours) at 07/10/2020 1717 Last data filed at 07/10/2020 1000 Gross per 24 hour  Intake 240 ml  Output 2325 ml  Net  -2085 ml   Filed Weights   07/05/20 0120 07/09/20 0450 07/10/20 0500  Weight: (!) 246.8 kg (!) 227.3 kg (!) 228.6 kg    Examination:  General: No acute distress. Cardiovascular: Heart sounds show Numan Zylstra regular rate, and rhythm. Lungs: distant, unlabored Abdomen: Soft, nontender, nondistended  Neurological: Alert and oriented 3. Moves all extremities 4 . Cranial nerves II through XII grossly intact. Skin: Warm and dry. No rashes or lesions. Extremities: bilateral LE edema    Data Reviewed: I have personally reviewed following labs and imaging studies  CBC: Recent Labs  Lab 07/06/20 0649 07/07/20 0412 07/08/20 0403 07/09/20 0354 07/10/20 0354  WBC 9.1 8.2 7.7 7.5 6.1  NEUTROABS 7.0 5.9 5.7 5.6 4.3  HGB 9.9* 10.1* 10.1* 10.1* 10.0*  HCT 35.2* 35.9* 36.6 36.3 35.9*  MCV 85.6 85.1 86.1 85.0 84.5  PLT 261 263 274 276 217    Basic Metabolic Panel: Recent Labs  Lab 07/06/20 0649 07/07/20 0412 07/08/20 0403 07/09/20 0354 07/10/20 0354  NA 140 141 140 138 139  K 3.6 3.6 3.7 3.9 3.5  CL 99 96* 96* 96* 98  CO2 35* 38* 37* 36* 36*  GLUCOSE 91 85 91 99 90  BUN 7 7 6 6 6   CREATININE 0.64 0.67 0.79 0.72 0.80  CALCIUM 7.9* 7.9* 7.9* 8.0* 8.1*  MG 1.6* 1.5* 1.6* 1.7 1.7  PHOS  --   --   --   --  4.4    GFR: Estimated Creatinine Clearance: 166.8 mL/min (by C-G formula based on SCr of 0.8 mg/dL).  Liver Function Tests: Recent Labs  Lab 07/04/20 0424 07/05/20 0415 07/06/20 0649 07/10/20 0354  AST 38 42* 32 28  ALT 105* 90* 69* 32  ALKPHOS 67 68 59 53  BILITOT 1.0 0.9 0.9 0.9  PROT 6.6 6.8 6.4* 6.2*  ALBUMIN 2.7* 2.7* 2.7* 2.4*    CBG: Recent Labs  Lab 07/09/20 1107 07/09/20 1601 07/10/20 0804 07/10/20 1224 07/10/20 1651  GLUCAP 107* 111* 91 118* 94     No results found for this or any previous visit (from the past 240 hour(s)).       Radiology Studies: No results found.      Scheduled Meds: . (feeding supplement) PROSource Plus  30 mL Oral  TID BM  . acetaZOLAMIDE  250 mg Oral BID  . furosemide  40 mg Intravenous BID  . magnesium oxide  800 mg Oral BID  . mouth rinse  15 mL Mouth Rinse BID  . multivitamin with minerals  1 tablet Oral Daily  . pantoprazole  40 mg Oral QHS  . Ensure Max Protein  11 oz Oral BID  . Rivaroxaban  15 mg Oral BID WC   Followed by  . [START ON 07/22/2020] rivaroxaban  20 mg Oral Q supper   Continuous Infusions:   LOS: 15 days    Time spent: over 30 min    Lacretia Nicks, MD [facility]   To contact the attending provider between 7A-7P or the covering provider during after hours 7P-7A, please log into the web site [email or web address] and access using universal Solomon password for that web site. If you do not have the password, please call the hospital operator.  07/10/2020, 5:17 PM

## 2020-07-11 LAB — COMPREHENSIVE METABOLIC PANEL
ALT: 28 U/L (ref 0–44)
AST: 46 U/L — ABNORMAL HIGH (ref 15–41)
Albumin: 2.4 g/dL — ABNORMAL LOW (ref 3.5–5.0)
Alkaline Phosphatase: 51 U/L (ref 38–126)
Anion gap: 4 — ABNORMAL LOW (ref 5–15)
BUN: 8 mg/dL (ref 6–20)
CO2: 35 mmol/L — ABNORMAL HIGH (ref 22–32)
Calcium: 7.8 mg/dL — ABNORMAL LOW (ref 8.9–10.3)
Chloride: 102 mmol/L (ref 98–111)
Creatinine, Ser: 0.87 mg/dL (ref 0.44–1.00)
GFR, Estimated: >60 mL/min (ref >60)
Glucose, Bld: 99 mg/dL (ref 70–99)
Potassium: 3.9 mmol/L (ref 3.5–5.1)
Sodium: 141 mmol/L (ref 135–145)
Total Bilirubin: 1.1 mg/dL (ref 0.3–1.2)
Total Protein: 6.1 g/dL — ABNORMAL LOW (ref 6.5–8.1)

## 2020-07-11 LAB — CBC WITH DIFFERENTIAL/PLATELET
Abs Immature Granulocytes: 0.09 10*3/uL — ABNORMAL HIGH (ref 0.00–0.07)
Basophils Absolute: 0 10*3/uL (ref 0.0–0.1)
Basophils Relative: 1 %
Eosinophils Absolute: 0.2 10*3/uL (ref 0.0–0.5)
Eosinophils Relative: 4 %
HCT: 35.5 % — ABNORMAL LOW (ref 36.0–46.0)
Hemoglobin: 9.8 g/dL — ABNORMAL LOW (ref 12.0–15.0)
Immature Granulocytes: 1 %
Lymphocytes Relative: 17 %
Lymphs Abs: 1.1 10*3/uL (ref 0.7–4.0)
MCH: 24.2 pg — ABNORMAL LOW (ref 26.0–34.0)
MCHC: 27.6 g/dL — ABNORMAL LOW (ref 30.0–36.0)
MCV: 87.7 fL (ref 80.0–100.0)
Monocytes Absolute: 0.5 10*3/uL (ref 0.1–1.0)
Monocytes Relative: 7 %
Neutro Abs: 4.4 10*3/uL (ref 1.7–7.7)
Neutrophils Relative %: 70 %
Platelets: 295 10*3/uL (ref 150–400)
RBC: 4.05 MIL/uL (ref 3.87–5.11)
RDW: 18.4 % — ABNORMAL HIGH (ref 11.5–15.5)
WBC: 6.2 10*3/uL (ref 4.0–10.5)
nRBC: 0 % (ref 0.0–0.2)

## 2020-07-11 LAB — MAGNESIUM: Magnesium: 2 mg/dL (ref 1.7–2.4)

## 2020-07-11 LAB — GLUCOSE, CAPILLARY
Glucose-Capillary: 98 mg/dL (ref 70–99)
Glucose-Capillary: 131 mg/dL — ABNORMAL HIGH (ref 70–99)
Glucose-Capillary: 143 mg/dL — ABNORMAL HIGH (ref 70–99)

## 2020-07-11 LAB — BLOOD GAS, VENOUS
Acid-Base Excess: 9.3 mmol/L — ABNORMAL HIGH (ref 0.0–2.0)
Bicarbonate: 35.6 mmol/L — ABNORMAL HIGH (ref 20.0–28.0)
O2 Saturation: 93 %
Patient temperature: 98.6
pCO2, Ven: 62 mmHg — ABNORMAL HIGH (ref 44.0–60.0)
pH, Ven: 7.378 (ref 7.250–7.430)
pO2, Ven: 68.9 mmHg — ABNORMAL HIGH (ref 32.0–45.0)

## 2020-07-11 LAB — PHOSPHORUS: Phosphorus: 4 mg/dL (ref 2.5–4.6)

## 2020-07-11 NOTE — Progress Notes (Signed)
Notified on call provider about patient's pCO2 of 77.6 and pO2 <31.0. On call provider gave orders to have patient put on BIPAP and to recheck patients blood gases in two hours.

## 2020-07-11 NOTE — Plan of Care (Signed)

## 2020-07-11 NOTE — Progress Notes (Signed)
PROGRESS NOTE    Felicia Warren  HWT:[id number] DOB: 1970-04-30 DOA: 06/25/2020 PCP: Pcp, No   Chief Complaint  Patient presents with  . unresponsive   Brief Narrative:  50 yo F who presented initially as Felicia Warren, found with altered mental status in [place] [facility] motel by staff. It is unknown when her last known well was. EMS found patient awake, but altered with [id number]. This was corrected, but she continued to have worsening mental status. Narcan did not provide any response. In ED she required nonrebreather mask and eventual intubation for hypoxic respiratory failure. Chest x-ray with probable multifocal pneumonia. AKI, elevated INR on admit. No prior history in epicas she is from [facility] no known emergency contact.Admitted by PCCM given requirement for airway protection/intubation.   5/4 presented to ED with hypoglycemia and AMS, required intubation, PCCM consult. CXR concerning for PNA. Unasyn initiated. Tx to [facility] for beds. UDS neg, neg tylenol/ salicylates. CTH neg. Lactic acid 5.9->4.9  5/5 Hypotensive, responded to IVF + albumin, on heparin gtt but stopped due to platelet decline, not on pressors, requiring PEEP 10 / FiO2 80. RUQ [place] with cholelithiasis but no cholecystitis.   5/6 Vent needs decreased. LE venous duplex negative.   5/7 diarrhea > Placed flexiseal 5/8  5/8 WUA/SBT, extubated  Assessment & Plan:   Principal Problem:   Acute respiratory failure with hypoxia and hypercarbia (HCC) Active Problems:   Hypercapnic respiratory failure (HCC)   Elevated LFTs   Acute metabolic encephalopathy   Pulmonary emboli (HCC)  Acute on chronic hypoxic hypercarbic respiratory failure Probable OSA/OHS - Rapid response on 5/12 and 5/13 evenings due to inc WOB/desats - starting to tolerate spacing out BiPAP more; goal is to get on just nightly BiPAP (or none at all would be ideal) - saturation goal 88-92%, wean O2 accordingly - appreciate pulmonology following  again - continue lasix  - diamox added per pulmonology - net negative 9.8 L - follow up RD eval for possible calorie restrictions - seal of BiPAP also remains subpar at times  - will need outpatient sleep study  Acute Metabolic Encephalopathy  - etiology 2/2 chronic CO2 retention - follow vbg today, Felicia Warren little more drowsy than i'd expect during my evaulation - 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 Needs an outpatient sleep study at some point - at risk for needing [name] trach if ongoing need for BiPAP intermittently as well as if she cannot lose some weight to help   Pulmonary Embolism - CT PE protocol from 5/9 with area suspicious for segmental thrombus in L lower lobe associated with aispace disease, likely pulm infarct - imaging with findings concerning for right heart strain - echo with mcconnell's sign (see report) - LE [place] negative for DVT -Transition to xarelto - discussed with pharmacy  AKI, undetermined acuity/unkown baseline, resolved AGMA/lactic acidosis, resolved Hypokalemia, resolved Contraction alkalosis - Trend BMP / urinary output - Avoid nephrotoxic agents - continue diamox; see discussion above   Social barriers - patient from [place] and uninsured; her wish is to return to [place] to collect belongings and move to [place] as she has family here, however she has nowhere here to live yet if she moved - not sure if family would take her in after this hospitalization but this has been brought up to them and it didn't appear to be an option - if she's needing nightly BIPAP, that will be Felicia Warren factor; and/or if she needs continuous O2 that will also be Felicia Warren factor - mobility extremely limited; PT  following; staff needs to try and get her OOB to chair with meals as able - until these issues are fixed and/or addressed, she will remain in the hospital Felicia Warren prolonged amount of time  Demand ischemia - Troponin downtrending with oxygenation - Echo =LVEF 55-60%, no WMA, McConnell's sign with  moderately enlarged RV, interventricular septal flattening concerning for RV overload (likely due to OHS as above) - Suspected LLL segmental PE as above - on xarelto  Elevated liver enzymes - resolving  - Cannot rule out shock liver given profound hypoxia -RUQ US negative. Acute hepatitis panel negative. - Labs downtrending appropriately  Hypoglycemia-resolved -Glucose in 40's on admit, no available hx -Resolved with dextrose infusion  Diarrhea, resolving -Likely in setting of TF + bowel regimen  -PRN colace, miralax  Anemia, normocytic - likely chronic anemia of chronic disease given above -trend CBC  -transfuse for Hgb <7% -follow anemia labs - c/w aocd  DVT prophylaxis: xarelto Code Status: full  Family Communication: sisters at bedside Disposition:   Status is: Inpatient  Remains inpatient appropriate because:Inpatient level of care appropriate due to severity of illness   Dispo: The patient is from: Home              Anticipated d/c is to: pending              Patient currently is not medically stable to d/c.   Difficult to place patient Yes  Consultants:   Pulmonology  Procedures:  LE US Summary:  BILATERAL:  -No evidence of popliteal cyst, bilaterally.  RIGHT:  - There is no evidence of deep vein thrombosis in the lower extremity.  However, portions of this examination were limited- see technologist  comments above.    LEFT:  - There is no evidence of deep vein thrombosis in the lower extremity.  However, portions of this examination were limited- see technologist  comments above.     *See table(s) above for measurements and observations.   Echo IMPRESSIONS    1. Left ventricular ejection fraction, by estimation, is 55 to 60%. The  left ventricle has normal function. The left ventricle has no regional  wall motion abnormalities. Left ventricular diastolic parameters were  normal. There is the interventricular  septum is  flattened in systole and diastole, consistent with right  ventricular pressure and volume overload.  2. McConnell's Sign noted. Right ventricular systolic function is normal  in the base. The right ventricular size is moderately enlarged.  3. The mitral valve is grossly normal. No evidence of mitral valve  regurgitation.  4. The aortic valve is tricuspid. Aortic valve regurgitation is not  visualized. No aortic stenosis is present.  5. There is mild dilatation of the ascending aorta, measuring 41 mm.   Comparison(s): No prior Echocardiogram. Discussed finding with critical  care team.   Antimicrobials:  Anti-infectives (From admission, onward)   Start     Dose/Rate Route Frequency Ordered Stop   06/26/20 2000  cefTRIAXone (ROCEPHIN) 2 g in sodium chloride 0.9 % 100 mL IVPB  Status:  Discontinued        2 g 200 mL/hr over 30 Minutes Intravenous Every 24 hours 06/26/20 1818 07/01/20 1552   06/25/20 1900  Ampicillin-Sulbactam (UNASYN) 3 g in sodium chloride 0.9 % 100 mL IVPB  Status:  Discontinued        3 g 200 mL/hr over 30 Minutes Intravenous Every 12 hours 06/25/20 1857 06/26/20 1818   06/25/20 1700  cefTRIAXone (ROCEPHIN) 1 g in sodium  chloride 0.9 % 100 mL IVPB        1 g 200 mL/hr over 30 Minutes Intravenous  Once 06/25/20 1649 06/25/20 1859   06/25/20 1700  azithromycin (ZITHROMAX) 500 mg in sodium chloride 0.9 % 250 mL IVPB        500 mg 250 mL/hr over 60 Minutes Intravenous  Once 06/25/20 1649 06/25/20 2005      Subjective: No new complaints  Objective: Vitals:   07/11/20 0310 07/11/20 0500 07/11/20 0545 07/11/20 1322  BP:   (!) 124/53 116/72  Pulse: 85  70 83  Resp: 20  20 18   Temp:   98.3 F (36.8 C) 98.2 F (36.8 C)  TempSrc:    Oral  SpO2: 97%  98% 96%  Weight:  (!) 228.6 kg    Height:        Intake/Output Summary (Last 24 hours) at 07/11/2020 1644 Last data filed at 07/11/2020 1207 Gross per 24 hour  Intake 480 ml  Output 2000 ml  Net -1520 ml    Filed Weights   07/09/20 0450 07/10/20 0500 07/11/20 0500  Weight: (!) 227.3 kg (!) 228.6 kg (!) 228.6 kg    Examination:  General: No acute distress. Cardiovascular: Heart sounds show Kelsie Kramp regular rate, and rhythm. No gallops or rubs. No murmurs. No JVD. Lungs: Clear to auscultation bilaterally with good air movement. No rales, rhonchi or wheezes. Abdomen: Soft, nontender, nondistended with normal active bowel sounds. No masses. No hepatosplenomegaly. Neurological: Alert and oriented 3. Moves all extremities 4 with equal strength. Cranial nerves II through XII grossly intact. Skin: Warm and dry. No rashes or lesions. Extremities: bilateral LE edema    Data Reviewed: I have personally reviewed following labs and imaging studies  CBC: Recent Labs  Lab 07/07/20 0412 07/08/20 0403 07/09/20 0354 07/10/20 0354 07/11/20 0350  WBC 8.2 7.7 7.5 6.1 6.2  NEUTROABS 5.9 5.7 5.6 4.3 4.4  HGB 10.1* 10.1* 10.1* 10.0* 9.8*  HCT 35.9* 36.6 36.3 35.9* 35.5*  MCV 85.1 86.1 85.0 84.5 87.7  PLT 263 274 276 217 295    Basic Metabolic Panel: Recent Labs  Lab 07/07/20 0412 07/08/20 0403 07/09/20 0354 07/10/20 0354 07/11/20 0350  NA 141 140 138 139 141  K 3.6 3.7 3.9 3.5 3.9  CL 96* 96* 96* 98 102  CO2 38* 37* 36* 36* 35*  GLUCOSE 85 91 99 90 99  BUN 7 6 6 6 8   CREATININE 0.67 0.79 0.72 0.80 0.87  CALCIUM 7.9* 7.9* 8.0* 8.1* 7.8*  MG 1.5* 1.6* 1.7 1.7 2.0  PHOS  --   --   --  4.4 4.0    GFR: Estimated Creatinine Clearance: 153.4 mL/min (by C-G formula based on SCr of 0.87 mg/dL).  Liver Function Tests: Recent Labs  Lab 07/05/20 0415 07/06/20 0649 07/10/20 0354 07/11/20 0350  AST 42* 32 28 46*  ALT 90* 69* 32 28  ALKPHOS 68 59 53 51  BILITOT 0.9 0.9 0.9 1.1  PROT 6.8 6.4* 6.2* 6.1*  ALBUMIN 2.7* 2.7* 2.4* 2.4*    CBG: Recent Labs  Lab 07/10/20 0804 07/10/20 1224 07/10/20 1651 07/11/20 0749 07/11/20 1203  GLUCAP 91 118* 94 98 131*     No results found for  this or any previous visit (from the past 240 hour(s)).       Radiology Studies: No results found.      Scheduled Meds: . (feeding supplement) PROSource Plus  30 mL Oral TID BM  . acetaZOLAMIDE  250 mg Oral BID  . furosemide  40 mg Intravenous BID  . magnesium oxide  800 mg Oral BID  . mouth rinse  15 mL Mouth Rinse BID  . multivitamin with minerals  1 tablet Oral Daily  . pantoprazole  40 mg Oral QHS  . Ensure Max Protein  11 oz Oral BID  . Rivaroxaban  15 mg Oral BID WC   Followed by  . [START ON 07/22/2020] rivaroxaban  20 mg Oral Q supper   Continuous Infusions:   LOS: 16 days    Time spent: over 30 min    Lacretia Nicks, MD [facility]   To contact the attending provider between 7A-7P or the covering provider during after hours 7P-7A, please log into the web site [email or web address] and access using universal Christian password for that web site. If you do not have the password, please call the hospital operator.  07/11/2020, 4:44 PM

## 2020-07-11 NOTE — Progress Notes (Signed)
   NAME:  Felicia Warren, MRN:  [id number], DOB:  Jul 09, 1970, LOS: 16 ADMISSION DATE:  06/25/2020, CONSULTATION DATE:  07/11/20 REFERRING MD:  EDP, CHIEF COMPLAINT:  AMS   History of Present Illness:  50 y/o F who presented initially as a Felicia Warren, found with altered mental status in a motel by staff.  It is unknown when her last known well was.  EMS found patient awake, but altered with a glucose of 40.  This was corrected, but she continued to have worsening mental status.  Narcan did not provide any response.  In ED she required nonrebreather mask and eventual intubation for hypoxic respiratory failure.  Chest x-ray with probable multifocal pneumonia.  AKI, elevated INR on admit.  No prior history in epic and no known emergency contact. Work up consistent with RV overload, suspected OHS / OSA, LLL segmental PE on heparin.  AKI resolved.   Pertinent  Medical History  Obesity ?  OSA-undiagnosed  Significant Hospital Events: Including procedures, antibiotic start and stop dates in addition to other pertinent events    5/4 presented to ED with hypoglycemia and AMS, required intubation, PCCM consult. CXR concerning for PNA.  Unasyn initiated. Tx to [facility] for beds. UDS neg, neg tylenol/ salicylates. CTH neg. Lactic acid 5.9-> 4.9 . 5/5 Hypotensive, responded to IVF + albumin, on heparin gtt but stopped due to platelet decline, not on pressors, requiring PEEP 10 / FiO2 80. RUQ [place] with cholelithiasis but no cholecystitis.   Marland Kitchen 5/6 Vent needs decreased. LE venous duplex negative.  . 5/7 diarrhea > Placed flexiseal 5/8  5/8 WUA/SBT, extubated  5/9 CTA Chest with LLL segmental PE with associated airspace disease and pulmonary infarct, dilation of the main pulmonary artery up to 3.2cm.  To TRH.   5/16 started standing diamox   More alert and interactive, tolerating supplemental oxygen  Interim History / Subjective:  No overnight events Sleepy this morning, still on BiPAP at the time she was  seen Objective   Blood pressure (!) 124/53, pulse 70, temperature 98.3 F (36.8 C), resp. rate 20, height 5\' 6"  (1.676 m), weight (!) 228.6 kg, SpO2 98 %.    FiO2 (%):  [35 %] 35 %   Intake/Output Summary (Last 24 hours) at 07/11/2020 1045 Last data filed at 07/11/2020 0600 Gross per 24 hour  Intake 480 ml  Output 1250 ml  Net -770 ml   Filed Weights   07/09/20 0450 07/10/20 0500 07/11/20 0500  Weight: (!) 227.3 kg (!) 228.6 kg (!) 228.6 kg   Exam:  Middle-age, does not appear to be in distress, morbidly obese HENT: Mask in place Pulm: Decreased breath sounds bilaterally Card: S1-S2 appreciated Extremities: Warm and dry Neurologically: Sleepy  Labs/imaging that I havepersonally reviewed  (right click and "Reselect all SmartList Selections" daily)  Labs reviewed Bicarb trending down, PCO2 of 62 which may be patient's usual  Resolved Hospital Problem list   Hypotension / Shock  Thrombocytopenia AKI Acute metabolic (hypercarbic related) encephalopathy  Demand ischemia  Hypoglycemia  Assessment & Plan:   Cute on chronic respiratory failure Severe undiagnosed obstructive sleep apnea/obesity hypoventilation syndrome Pulmonary embolism Acute on chronic cor pulmonale  Continue anticoagulation Continue oxygen supplementation Continue with diuresis with Diamox  Sleep study as outpatient   07/13/20, [place] PCCM Pager: [phone]

## 2020-07-11 NOTE — Plan of Care (Signed)

## 2020-07-12 LAB — CBC WITH DIFFERENTIAL/PLATELET
Abs Immature Granulocytes: 0.05 10*3/uL (ref 0.00–0.07)
Basophils Absolute: 0 10*3/uL (ref 0.0–0.1)
Basophils Relative: 0 %
Eosinophils Absolute: 0.2 10*3/uL (ref 0.0–0.5)
Eosinophils Relative: 4 %
HCT: 34.7 % — ABNORMAL LOW (ref 36.0–46.0)
Hemoglobin: 9.7 g/dL — ABNORMAL LOW (ref 12.0–15.0)
Immature Granulocytes: 1 %
Lymphocytes Relative: 18 %
Lymphs Abs: 1.1 10*3/uL (ref 0.7–4.0)
MCH: 24 pg — ABNORMAL LOW (ref 26.0–34.0)
MCHC: 28 g/dL — ABNORMAL LOW (ref 30.0–36.0)
MCV: 85.7 fL (ref 80.0–100.0)
Monocytes Absolute: 0.5 10*3/uL (ref 0.1–1.0)
Monocytes Relative: 9 %
Neutro Abs: 4.1 10*3/uL (ref 1.7–7.7)
Neutrophils Relative %: 68 %
Platelets: 266 10*3/uL (ref 150–400)
RBC: 4.05 MIL/uL (ref 3.87–5.11)
RDW: 18.5 % — ABNORMAL HIGH (ref 11.5–15.5)
WBC: 6 10*3/uL (ref 4.0–10.5)
nRBC: 0 % (ref 0.0–0.2)

## 2020-07-12 LAB — BASIC METABOLIC PANEL
Anion gap: 4 — ABNORMAL LOW (ref 5–15)
BUN: 10 mg/dL (ref 6–20)
CO2: 38 mmol/L — ABNORMAL HIGH (ref 22–32)
Calcium: 8.5 mg/dL — ABNORMAL LOW (ref 8.9–10.3)
Chloride: 98 mmol/L (ref 98–111)
Creatinine, Ser: 0.94 mg/dL (ref 0.44–1.00)
GFR, Estimated: >60 mL/min (ref >60)
Glucose, Bld: 108 mg/dL — ABNORMAL HIGH (ref 70–99)
Potassium: 3.6 mmol/L (ref 3.5–5.1)
Sodium: 140 mmol/L (ref 135–145)

## 2020-07-12 LAB — GLUCOSE, CAPILLARY
Glucose-Capillary: 96 mg/dL (ref 70–99)
Glucose-Capillary: 119 mg/dL — ABNORMAL HIGH (ref 70–99)
Glucose-Capillary: 116 mg/dL — ABNORMAL HIGH (ref 70–99)

## 2020-07-12 LAB — COMPREHENSIVE METABOLIC PANEL
ALT: 27 U/L (ref 0–44)
AST: 28 U/L (ref 15–41)
Albumin: 2.5 g/dL — ABNORMAL LOW (ref 3.5–5.0)
Alkaline Phosphatase: 51 U/L (ref 38–126)
Anion gap: 8 (ref 5–15)
BUN: 8 mg/dL (ref 6–20)
CO2: 33 mmol/L — ABNORMAL HIGH (ref 22–32)
Calcium: 8.2 mg/dL — ABNORMAL LOW (ref 8.9–10.3)
Chloride: 99 mmol/L (ref 98–111)
Creatinine, Ser: 0.74 mg/dL (ref 0.44–1.00)
GFR, Estimated: >60 mL/min (ref >60)
Glucose, Bld: 97 mg/dL (ref 70–99)
Potassium: 2.9 mmol/L — ABNORMAL LOW (ref 3.5–5.1)
Sodium: 140 mmol/L (ref 135–145)
Total Bilirubin: 0.7 mg/dL (ref 0.3–1.2)
Total Protein: 6.1 g/dL — ABNORMAL LOW (ref 6.5–8.1)

## 2020-07-12 LAB — PHOSPHORUS: Phosphorus: 3.9 mg/dL (ref 2.5–4.6)

## 2020-07-12 LAB — MAGNESIUM: Magnesium: 2.2 mg/dL (ref 1.7–2.4)

## 2020-07-12 MED ORDER — POTASSIUM CHLORIDE CRYS ER 20 MEQ PO TBCR
40.0000 meq | EXTENDED_RELEASE_TABLET | Freq: Every day | ORAL | Status: DC
  Administered 2020-07-13 – 2020-07-15 (×3): 40 meq via ORAL
  Filled 2020-07-12 (×3): qty 2

## 2020-07-12 MED ORDER — POTASSIUM CHLORIDE CRYS ER 20 MEQ PO TBCR
40.0000 meq | EXTENDED_RELEASE_TABLET | Freq: Once | ORAL | Status: AC
  Administered 2020-07-12: 40 meq via ORAL
  Filled 2020-07-12: qty 2

## 2020-07-12 MED ORDER — POTASSIUM CHLORIDE CRYS ER 20 MEQ PO TBCR
40.0000 meq | EXTENDED_RELEASE_TABLET | ORAL | Status: AC
  Administered 2020-07-12 (×2): 40 meq via ORAL
  Filled 2020-07-12 (×2): qty 2

## 2020-07-12 NOTE — Progress Notes (Signed)
   NAME:  Felicia Warren, MRN:  [id number], DOB:  1970/04/07, LOS: 17 ADMISSION DATE:  06/25/2020, CONSULTATION DATE:  07/12/20 REFERRING MD:  EDP, CHIEF COMPLAINT:  AMS   History of Present Illness:  50 y/o F who presented initially as a Felicia Warren, found with altered mental status in a motel by staff.  It is unknown when her last known well was.  EMS found patient awake, but altered with a glucose of 40.  This was corrected, but she continued to have worsening mental status.  Narcan did not provide any response.  In ED she required nonrebreather mask and eventual intubation for hypoxic respiratory failure.  Chest x-ray with probable multifocal pneumonia.  AKI, elevated INR on admit.  No prior history in epic and no known emergency contact. Work up consistent with RV overload, suspected OHS / OSA, LLL segmental PE on heparin.  AKI resolved.   Pertinent  Medical History  Obesity ?  OSA-undiagnosed  Significant Hospital Events: Including procedures, antibiotic start and stop dates in addition to other pertinent events    5/4 presented to ED with hypoglycemia and AMS, required intubation, PCCM consult. CXR concerning for PNA.  Unasyn initiated. Tx to [facility] for beds. UDS neg, neg tylenol/ salicylates. CTH neg. Lactic acid 5.9-> 4.9 . 5/5 Hypotensive, responded to IVF + albumin, on heparin gtt but stopped due to platelet decline, not on pressors, requiring PEEP 10 / FiO2 80. RUQ [place] with cholelithiasis but no cholecystitis.   Marland Kitchen 5/6 Vent needs decreased. LE venous duplex negative.  . 5/7 diarrhea > Placed flexiseal 5/8  5/8 WUA/SBT, extubated  5/9 CTA Chest with LLL segmental PE with associated airspace disease and pulmonary infarct, dilation of the main pulmonary artery up to 3.2cm.  To TRH.   5/16 started standing diamox   More alert and interactive, tolerating supplemental oxygen  Interim History / Subjective:  No overnight events Slept well Interactive with no complaints this morning Objective    Blood pressure (!) 125/46, pulse 85, temperature 98.3 F (36.8 C), temperature source Oral, resp. rate (!) 24, height 5\' 6"  (1.676 m), weight (!) 228.6 kg, SpO2 97 %.        Intake/Output Summary (Last 24 hours) at 07/12/2020 0955 Last data filed at 07/12/2020 0500 Gross per 24 hour  Intake 0 ml  Output 2300 ml  Net -2300 ml   Filed Weights   07/09/20 0450 07/10/20 0500 07/11/20 0500  Weight: (!) 227.3 kg (!) 228.6 kg (!) 228.6 kg   Exam:  Middle-age, morbidly obese, does not appear to be in distress HENT: Moist oral mucosa Pulm: Decreased breath sounds bilaterally Card: S1-S2 appreciated Extremities: Warm and dry  Labs/imaging that I havepersonally reviewed  (right click and "Reselect all SmartList Selections" daily)  Labs reviewed Bicarb trending down, PCO2 of 62 which may be patient's usual Hypokalemia-being repleted  Resolved Hospital Problem list   Hypotension / Shock  Thrombocytopenia AKI Acute metabolic (hypercarbic related) encephalopathy  Demand ischemia  Hypoglycemia   Assessment & Plan:   Acute on chronic respiratory failure Undiagnosed obstructive sleep apnea/obesity hypoventilation Pulmonary embolism Acute on chronic cor pulmonale   Continue oxygen supplementation Continue BiPAP at night Continue diuresis with Diamox Continue anticoagulation  Will require sleep study as an outpatient  07/13/20, MD [place] PCCM Pager: [phone]

## 2020-07-12 NOTE — Progress Notes (Signed)
Physical Therapy Treatment Patient Details Name: Felicia Warren MRN: [id number] DOB: 1970/03/05 Today's Date: 07/12/2020    History of Present Illness Patient is a 50 year old female admitted with hypoglycemia and AMS, needing intubation on 5/4 and extubated on 5/8. Unsure of past medical history, patient currently living in [place] plans to move to [place] to be closer to family.    PT Comments    Pt in bed on 1 lt O2 at 94% sleeping.  Required increased time to arouse. General Comments: AxO x 3 and pleasant but not willing to share her D/C plans or straight up tell us who will be caring for her at home.  Requires MAX encouragement. Pt progressing well with her mobility but continues to need oxygen with activity.   General bed mobility comments: pt did not require physical assist from therapist just increased time to motivate and use of rail to push self up.  General transfer comment: pt declined the walker "I don't need that" and was able to self rise with increased time and forward weight shft. General Gait Details: pt declined walker again for amb but was able to self walk to bathroom from bed approx 18 feet.  Remained on 1 lt oxygen sats decreased from 92% to 86% with activity.  HR avg 109.  dyspnea 2/4. Pt on toilet "I need some time".  NT called to room to assist pt further and instructed to have pt sit in recliner when done.    Follow Up Recommendations  SNF     Equipment Recommendations  Rolling walker with 5" wheels    Recommendations for Other Services       Precautions / Restrictions Precautions Precautions: Fall Precaution Comments: bariatric    Mobility  Bed Mobility Overal bed mobility: Needs Assistance Bed Mobility: Rolling;Sidelying to Sit Rolling: Supervision   Supine to sit: Supervision     General bed mobility comments: pt did not require physical assist from therapist just increased time to motivate and use of rail to push self up    Transfers Overall  transfer level: Needs assistance Equipment used: None Transfers: Sit to/from Stand;Stand Pivot Transfers Sit to Stand: Supervision Stand pivot transfers: Supervision       General transfer comment: pt declined the walker "I don't need that" and was able to self rise with increased time and forward weight shft.  Ambulation/Gait Ambulation/Gait assistance: Supervision Gait Distance (Feet): 18 Feet Assistive device: None Gait Pattern/deviations: Step-to pattern;Decreased step length - right;Decreased step length - left;Wide base of support Gait velocity: decreased   General Gait Details: pt declined walker again for amb but was able to self walk to bathroom from bed approx 18 feet.  Remained on 1 lt oxygen sats decreased from 92% to 86% with activity.  HR avg 109.  dyspnea 2/4.   Stairs             Wheelchair Mobility    Modified Rankin (Stroke Patients Only)       Balance                                            Cognition Arousal/Alertness: Awake/alert Behavior During Therapy: WFL for tasks assessed/performed Overall Cognitive Status: Within Functional Limits for tasks assessed  General Comments: AxO x 3 and pleasant but not willing to share her D/C plans or straight up tell us who will be caring for her at home.  Requires MAX encouragement.      Exercises      General Comments        Pertinent Vitals/Pain Pain Assessment: No/denies pain    Home Living                      Prior Function            PT Goals (current goals can now be found in the care plan section) Progress towards PT goals: Progressing toward goals    Frequency    Min 3X/week      PT Plan Current plan remains appropriate    Co-evaluation              AM-PAC PT "6 Clicks" Mobility   Outcome Measure  Help needed turning from your back to your side while in a flat bed without using bedrails?: A  Little Help needed moving from lying on your back to sitting on the side of a flat bed without using bedrails?: A Little Help needed moving to and from a bed to a chair (including a wheelchair)?: A Little Help needed standing up from a chair using your arms (e.g., wheelchair or bedside chair)?: A Little Help needed to walk in hospital room?: A Little Help needed climbing 3-5 steps with a railing? : A Little 6 Click Score: 18    End of Session Equipment Utilized During Treatment: Gait belt;Oxygen Activity Tolerance: Patient tolerated treatment well Patient left: Other (comment) (in bathroom with NT) Nurse Communication: Mobility status PT Visit Diagnosis: Muscle weakness (generalized) (M62.81);Difficulty in walking, not elsewhere classified (R26.2)     Time: 1610-9604 PT Time Calculation (min) (ACUTE ONLY): 28 min  Charges:  $Gait Training: 8-22 mins $Therapeutic Activity: 8-22 mins                     Felecia Shelling  PTA Acute  Rehabilitation Services Pager      [phone] Office      [phone]

## 2020-07-12 NOTE — Progress Notes (Signed)
PROGRESS NOTE    Felicia Warren  XFG:[id number] DOB: 22-Dec-1970 DOA: 06/25/2020 PCP: Pcp, No   Chief Complaint  Patient presents with  . unresponsive   Brief Narrative:  50 yo F who presented initially as Felicia Warren, found with altered mental status in [place] [facility] by staff. It is unknown when her last known well was. EMS found patient awake, but altered with aglucoseof40. This was corrected, but she continued to have worsening mental status. Narcan did not provide any response. In ED she required nonrebreather mask and eventual intubation for hypoxic respiratory failure. Chest x-ray with probable multifocal pneumonia. AKI, elevated INR on admit. No prior history in epicas she is from [facility] no known emergency contact.Admitted by PCCM given requirement for airway protection/intubation.   5/4 presented to ED with hypoglycemia and AMS, required intubation, PCCM consult. CXR concerning for PNA. Unasyn initiated. Tx to [facility] for beds. UDS neg, neg tylenol/ salicylates. CTH neg. Lactic acid 5.9->4.9  5/5 Hypotensive, responded to IVF + albumin, on heparin gtt but stopped due to platelet decline, not on pressors, requiring PEEP 10 / FiO2 80. RUQ [place] with cholelithiasis but no cholecystitis.   5/6 Vent needs decreased. LE venous duplex negative.   5/7 diarrhea > Placed flexiseal 5/8  5/8 WUA/SBT, extubated  Assessment & Plan:   Principal Problem:   Acute respiratory failure with hypoxia and hypercarbia (HCC) Active Problems:   Hypercapnic respiratory failure (HCC)   Elevated LFTs   Acute metabolic encephalopathy   Pulmonary emboli (HCC)  Acute on chronic hypoxic hypercarbic respiratory failure Probable OSA/OHS - Rapid response on 5/12 and 5/13 evenings due to inc WOB/desats - starting to tolerate spacing out BiPAP more; goal is to get on just nightly BiPAP (or none at all would be ideal) - saturation goal 88-92%, wean O2 accordingly - appreciate pulmonology following  again - continue lasix  - diamox added per pulmonology - net negative 16 L - follow up RD eval for possible calorie restrictions - seal of BiPAP also remains subpar at times  - will need outpatient sleep study  Acute Metabolic Encephalopathy  - etiology 2/2 chronic CO2 retention - VBG with compensated resp acidosis - 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 Needs an outpatient sleep study at some point - at risk for needing Braelee Herrle trach if ongoing need for BiPAP intermittently as well as if she cannot lose some weight to help   Pulmonary Embolism - CT PE protocol from 5/9 with area suspicious for segmental thrombus in L lower lobe associated with aispace disease, likely pulm infarct - imaging with findings concerning for right heart strain - echo with mcconnell's sign (see report) - LE [place] negative for DVT -Transition to xarelto - discussed with pharmacy  AKI, undetermined acuity/unkown baseline, resolved AGMA/lactic acidosis, resolved Hypokalemia, resolved Contraction alkalosis - Trend BMP / urinary output - Avoid nephrotoxic agents - continue diamox; see discussion above   Hypokalemia - replace, follow  Social barriers - patient from [place] and uninsured; her wish is to return to [place] to [place] belongings and move to [place] as she has family here, however she has nowhere here to live yet if she moved - not sure if family would take her in after this hospitalization but this has been brought up to them and it didn't appear to be an option - if she's needing nightly BIPAP, that will be Iris Hairston factor; and/or if she needs continuous O2 that will also be Dayja Loveridge factor - mobility extremely limited; PT following; staff needs  to try and get her OOB to chair with meals as able - until these issues are fixed and/or addressed, she will remain in the hospital Kenneshia Rehm prolonged amount of time  Demand ischemia - Troponin downtrending with oxygenation - Echo =LVEF 55-60%, no WMA, McConnell's sign with moderately  enlarged RV, interventricular septal flattening concerning for RV overload (likely due to OHS as above) - Suspected LLL segmental PE as above - on xarelto  Elevated liver enzymes - resolving  - Cannot rule out shock liver given profound hypoxia -RUQ US negative. Acute hepatitis panel negative. - Labs downtrending appropriately  Hypoglycemia-resolved -Glucose in 40's on admit, no available hx -Resolved with dextrose infusion  Diarrhea, resolving -Likely in setting of TF + bowel regimen  -PRN colace, miralax  Anemia, normocytic - likely chronic anemia of chronic disease given above -trend CBC  -transfuse for Hgb <7% -follow anemia labs - c/w aocd  DVT prophylaxis: xarelto Code Status: full  Family Communication: sisters at bedside Disposition:   Status is: Inpatient  Remains inpatient appropriate because:Inpatient level of care appropriate due to severity of illness   Dispo: The patient is from: Home              Anticipated d/c is to: pending              Patient currently is not medically stable to d/c.   Difficult to place patient Yes  Consultants:   Pulmonology  Procedures:  LE US Summary:  BILATERAL:  -No evidence of popliteal cyst, bilaterally.  RIGHT:  - There is no evidence of deep vein thrombosis in the lower extremity.  However, portions of this examination were limited- see technologist  comments above.    LEFT:  - There is no evidence of deep vein thrombosis in the lower extremity.  However, portions of this examination were limited- see technologist  comments above.     *See table(s) above for measurements and observations.   Echo IMPRESSIONS    1. Left ventricular ejection fraction, by estimation, is 55 to 60%. The  left ventricle has normal function. The left ventricle has no regional  wall motion abnormalities. Left ventricular diastolic parameters were  normal. There is the interventricular  septum is flattened in  systole and diastole, consistent with right  ventricular pressure and volume overload.  2. McConnell's Sign noted. Right ventricular systolic function is normal  in the base. The right ventricular size is moderately enlarged.  3. The mitral valve is grossly normal. No evidence of mitral valve  regurgitation.  4. The aortic valve is tricuspid. Aortic valve regurgitation is not  visualized. No aortic stenosis is present.  5. There is mild dilatation of the ascending aorta, measuring 41 mm.   Comparison(s): No prior Echocardiogram. Discussed finding with critical  care team.   Antimicrobials:  Anti-infectives (From admission, onward)   Start     Dose/Rate Route Frequency Ordered Stop   06/26/20 2000  cefTRIAXone (ROCEPHIN) 2 g in sodium chloride 0.9 % 100 mL IVPB  Status:  Discontinued        2 g 200 mL/hr over 30 Minutes Intravenous Every 24 hours 06/26/20 1818 07/01/20 1552   06/25/20 1900  Ampicillin-Sulbactam (UNASYN) 3 g in sodium chloride 0.9 % 100 mL IVPB  Status:  Discontinued        3 g 200 mL/hr over 30 Minutes Intravenous Every 12 hours 06/25/20 1857 06/26/20 1818   06/25/20 1700  cefTRIAXone (ROCEPHIN) 1 g in sodium chloride 0.9 %  100 mL IVPB        1 g 200 mL/hr over 30 Minutes Intravenous  Once 06/25/20 1649 06/25/20 1859   06/25/20 1700  azithromycin (ZITHROMAX) 500 mg in sodium chloride 0.9 % 250 mL IVPB        500 mg 250 mL/hr over 60 Minutes Intravenous  Once 06/25/20 1649 06/25/20 2005      Subjective: No new complaints  Objective: Vitals:   07/12/20 0128 07/12/20 0411 07/12/20 0550 07/12/20 0937  BP:   (!) 125/46   Pulse: 76 73 85   Resp: 14 (!) 23 (!) 24   Temp:   98.3 F (36.8 C)   TempSrc:   Oral   SpO2: 100% 97% 99% 97%  Weight:      Height:        Intake/Output Summary (Last 24 hours) at 07/12/2020 1504 Last data filed at 07/12/2020 1234 Gross per 24 hour  Intake 0 ml  Output 3150 ml  Net -3150 ml   Filed Weights   07/09/20 0450 07/10/20  0500 07/11/20 0500  Weight: (!) 227.3 kg (!) 228.6 kg (!) 228.6 kg    Examination:  General: No acute distress. Cardiovascular: Heart sounds show Arrion Burruel regular rate, and rhythm.  Lungs: Clear to auscultation bilaterally Abdomen: Soft, nontender, nondistended Neurological: Alert and oriented 3. Moves all extremities 4 . Cranial nerves II through XII grossly intact. Skin: Warm and dry. No rashes or lesions. Extremities: bilateral LE edema     Data Reviewed: I have personally reviewed following labs and imaging studies  CBC: Recent Labs  Lab 07/08/20 0403 07/09/20 0354 07/10/20 0354 07/11/20 0350 07/12/20 0440  WBC 7.7 7.5 6.1 6.2 6.0  NEUTROABS 5.7 5.6 4.3 4.4 4.1  HGB 10.1* 10.1* 10.0* 9.8* 9.7*  HCT 36.6 36.3 35.9* 35.5* 34.7*  MCV 86.1 85.0 84.5 87.7 85.7  PLT 274 276 217 295 266    Basic Metabolic Panel: Recent Labs  Lab 07/08/20 0403 07/09/20 0354 07/10/20 0354 07/11/20 0350 07/12/20 0440  NA 140 138 139 141 140  K 3.7 3.9 3.5 3.9 2.9*  CL 96* 96* 98 102 99  CO2 37* 36* 36* 35* 33*  GLUCOSE 91 99 90 99 97  BUN 6 6 6 8 8   CREATININE 0.79 0.72 0.80 0.87 0.74  CALCIUM 7.9* 8.0* 8.1* 7.8* 8.2*  MG 1.6* 1.7 1.7 2.0 2.2  PHOS  --   --  4.4 4.0 3.9    GFR: Estimated Creatinine Clearance: 166.8 mL/min (by C-G formula based on SCr of 0.74 mg/dL).  Liver Function Tests: Recent Labs  Lab 07/06/20 0649 07/10/20 0354 07/11/20 0350 07/12/20 0440  AST 32 28 46* 28  ALT 69* 32 28 27  ALKPHOS 59 53 51 51  BILITOT 0.9 0.9 1.1 0.7  PROT 6.4* 6.2* 6.1* 6.1*  ALBUMIN 2.7* 2.4* 2.4* 2.5*    CBG: Recent Labs  Lab 07/11/20 0749 07/11/20 1203 07/11/20 1745 07/12/20 0755 07/12/20 1302  GLUCAP 98 131* 143* 96 119*     No results found for this or any previous visit (from the past 240 hour(s)).       Radiology Studies: No results found.      Scheduled Meds: . (feeding supplement) PROSource Plus  30 mL Oral TID BM  . acetaZOLAMIDE  250 mg Oral  BID  . furosemide  40 mg Intravenous BID  . magnesium oxide  800 mg Oral BID  . mouth rinse  15 mL Mouth Rinse BID  . multivitamin  with minerals  1 tablet Oral Daily  . pantoprazole  40 mg Oral QHS  . Ensure Max Protein  11 oz Oral BID  . Rivaroxaban  15 mg Oral BID WC   Followed by  . [START ON 07/22/2020] rivaroxaban  20 mg Oral Q supper   Continuous Infusions:   LOS: 17 days    Time spent: over 30 min    Lacretia Nicks, MD [facility]   To contact the attending provider between 7A-7P or the covering provider during after hours 7P-7A, please log into the web site [email or web address] and access using universal [place] password for that web site. If you do not have the password, please call the hospital operator.  07/12/2020, 3:04 PM

## 2020-07-12 NOTE — Plan of Care (Signed)

## 2020-07-13 LAB — COMPREHENSIVE METABOLIC PANEL
ALT: 27 U/L (ref 0–44)
AST: 29 U/L (ref 15–41)
Albumin: 2.6 g/dL — ABNORMAL LOW (ref 3.5–5.0)
Alkaline Phosphatase: 55 U/L (ref 38–126)
Anion gap: 7 (ref 5–15)
BUN: 9 mg/dL (ref 6–20)
CO2: 31 mmol/L (ref 22–32)
Calcium: 8.2 mg/dL — ABNORMAL LOW (ref 8.9–10.3)
Chloride: 102 mmol/L (ref 98–111)
Creatinine, Ser: 0.79 mg/dL (ref 0.44–1.00)
GFR, Estimated: >60 mL/min (ref >60)
Glucose, Bld: 99 mg/dL (ref 70–99)
Potassium: 3.4 mmol/L — ABNORMAL LOW (ref 3.5–5.1)
Sodium: 140 mmol/L (ref 135–145)
Total Bilirubin: 0.7 mg/dL (ref 0.3–1.2)
Total Protein: 6.2 g/dL — ABNORMAL LOW (ref 6.5–8.1)

## 2020-07-13 LAB — CBC WITH DIFFERENTIAL/PLATELET
Abs Immature Granulocytes: 0.04 10*3/uL (ref 0.00–0.07)
Basophils Absolute: 0 10*3/uL (ref 0.0–0.1)
Basophils Relative: 1 %
Eosinophils Absolute: 0.2 10*3/uL (ref 0.0–0.5)
Eosinophils Relative: 4 %
HCT: 36.7 % (ref 36.0–46.0)
Hemoglobin: 10 g/dL — ABNORMAL LOW (ref 12.0–15.0)
Immature Granulocytes: 1 %
Lymphocytes Relative: 18 %
Lymphs Abs: 1 10*3/uL (ref 0.7–4.0)
MCH: 23.4 pg — ABNORMAL LOW (ref 26.0–34.0)
MCHC: 27.2 g/dL — ABNORMAL LOW (ref 30.0–36.0)
MCV: 85.9 fL (ref 80.0–100.0)
Monocytes Absolute: 0.5 10*3/uL (ref 0.1–1.0)
Monocytes Relative: 8 %
Neutro Abs: 3.9 10*3/uL (ref 1.7–7.7)
Neutrophils Relative %: 68 %
Platelets: 277 10*3/uL (ref 150–400)
RBC: 4.27 MIL/uL (ref 3.87–5.11)
RDW: 18.8 % — ABNORMAL HIGH (ref 11.5–15.5)
WBC: 5.7 10*3/uL (ref 4.0–10.5)
nRBC: 0 % (ref 0.0–0.2)

## 2020-07-13 LAB — GLUCOSE, CAPILLARY
Glucose-Capillary: 89 mg/dL (ref 70–99)
Glucose-Capillary: 111 mg/dL — ABNORMAL HIGH (ref 70–99)
Glucose-Capillary: 105 mg/dL — ABNORMAL HIGH (ref 70–99)

## 2020-07-13 LAB — PHOSPHORUS: Phosphorus: 3.7 mg/dL (ref 2.5–4.6)

## 2020-07-13 LAB — MAGNESIUM: Magnesium: 1.9 mg/dL (ref 1.7–2.4)

## 2020-07-13 MED ORDER — POTASSIUM CHLORIDE CRYS ER 20 MEQ PO TBCR
40.0000 meq | EXTENDED_RELEASE_TABLET | Freq: Once | ORAL | Status: AC
  Administered 2020-07-13: 40 meq via ORAL
  Filled 2020-07-13: qty 2

## 2020-07-13 NOTE — Progress Notes (Signed)
Placed patient on BIPAP for HS use.Tolerating well at this time. 

## 2020-07-13 NOTE — Plan of Care (Signed)

## 2020-07-13 NOTE — Progress Notes (Signed)
PROGRESS NOTE    Laquenta Oman  LOV:[id number] DOB: 1970/11/26 DOA: 06/25/2020 PCP: Pcp, No   Chief Complaint  Patient presents with  . unresponsive   Brief Narrative:  50 yo F who presented initially as Herrick Hartog [name], found with altered mental status in [facility] by staff. It is unknown when her last known well was. EMS found patient awake, but altered with aglucoseof40. This was corrected, but she continued to have worsening mental status. Narcan did not provide any response. In ED she required nonrebreather mask and eventual intubation for hypoxic respiratory failure. Chest x-ray with probable multifocal pneumonia. AKI, elevated INR on admit. No prior history in epicas she is from [facility] no known emergency contact.Admitted by PCCM given requirement for airway protection/intubation.   5/4 presented to ED with hypoglycemia and AMS, required intubation, PCCM consult. CXR concerning for PNA. Unasyn initiated. Tx to [facility] for beds. UDS neg, neg tylenol/ salicylates. CTH neg. Lactic acid 5.9->4.9  5/5 Hypotensive, responded to IVF + albumin, on heparin gtt but stopped due to platelet decline, not on pressors, requiring PEEP 10 / FiO2 80. RUQ [place] with cholelithiasis but no cholecystitis.   5/6 Vent needs decreased. LE venous duplex negative.   5/7 diarrhea > Placed flexiseal 5/8  5/8 WUA/SBT, extubated  Assessment & Plan:   Principal Problem:   Acute respiratory failure with hypoxia and hypercarbia (HCC) Active Problems:   Hypercapnic respiratory failure (HCC)   Elevated LFTs   Acute metabolic encephalopathy   Pulmonary emboli (HCC)  Acute on chronic hypoxic hypercarbic respiratory failure Probable OSA/OHS - Rapid response on 5/12 and 5/13 evenings due to inc WOB/desats - starting to tolerate spacing out BiPAP more; goal is to get on just nightly BiPAP (or none at all would be ideal) - saturation goal 88-92%, wean O2 accordingly - appreciate pulmonology following  again - continue lasix  - diamox added per pulmonology - net negative 19.9 L - follow up RD eval for possible calorie restrictions - seal of BiPAP also remains subpar at times  - will need outpatient sleep study  Acute Metabolic Encephalopathy  - etiology 2/2 chronic CO2 retention - VBG with compensated resp acidosis - 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 Needs an outpatient sleep study at some point - at risk for needing Xiong Haidar trach if ongoing need for BiPAP intermittently as well as if she cannot lose some weight to help   Pulmonary Embolism - CT PE protocol from 5/9 with area suspicious for segmental thrombus in L lower lobe associated with aispace disease, likely pulm infarct - imaging with findings concerning for right heart strain - echo with mcconnell's sign (see report) - LE [place] negative for DVT -Transition to xarelto - discussed with pharmacy  AKI, undetermined acuity/unkown baseline, resolved AGMA/lactic acidosis, resolved Hypokalemia, resolved Contraction alkalosis - Trend BMP / urinary output - Avoid nephrotoxic agents - continue diamox; see discussion above   Hypokalemia - replace, follow  Social barriers - patient from [place] and uninsured; her [place] is to return to [place] to [place] belongings and move to [place] as she has family here, however she has nowhere here to live yet if she moved - not sure if family would take her in after this hospitalization but this has been brought up to them and it didn't appear to be an option - if she's needing nightly BIPAP, that will be Hisako Bugh factor; and/or if she needs continuous O2 that will also be Salif Tay factor - mobility extremely limited; PT following; staff needs  to try and get her OOB to chair with meals as able - until these issues are fixed and/or addressed, she will remain in the hospital Camie Hauss prolonged amount of time  Demand ischemia - Troponin downtrending with oxygenation - Echo =LVEF 55-60%, no WMA, McConnell's sign with moderately  enlarged RV, interventricular septal flattening concerning for RV overload (likely due to OHS as above) - Suspected LLL segmental PE as above - on xarelto  Elevated liver enzymes - resolving  - Cannot rule out shock liver given profound hypoxia -RUQ US negative. Acute hepatitis panel negative. - Labs downtrending appropriately  Hypoglycemia-resolved -Glucose in 40's on admit, no available hx -Resolved with dextrose infusion  Diarrhea, resolving -Likely in setting of TF + bowel regimen  -PRN colace, miralax  Anemia, normocytic - likely chronic anemia of chronic disease given above -trend CBC  -transfuse for Hgb <7% -follow anemia labs - c/w aocd  DVT prophylaxis: xarelto Code Status: full  Family Communication: sisters at bedside Disposition:   Status is: Inpatient  Remains inpatient appropriate because:Inpatient level of care appropriate due to severity of illness   Dispo: The patient is from: Home              Anticipated d/c is to: pending              Patient currently is not medically stable to d/c.   Difficult to place patient Yes  Consultants:   Pulmonology  Procedures:  LE US Summary:  BILATERAL:  -No evidence of popliteal cyst, bilaterally.  RIGHT:  - There is no evidence of deep vein thrombosis in the lower extremity.  However, portions of this examination were limited- see technologist  comments above.    LEFT:  - There is no evidence of deep vein thrombosis in the lower extremity.  However, portions of this examination were limited- see technologist  comments above.     *See table(s) above for measurements and observations.   Echo IMPRESSIONS    1. Left ventricular ejection fraction, by estimation, is 55 to 60%. The  left ventricle has normal function. The left ventricle has no regional  wall motion abnormalities. Left ventricular diastolic parameters were  normal. There is the interventricular  septum is flattened in  systole and diastole, consistent with right  ventricular pressure and volume overload.  2. McConnell's Sign noted. Right ventricular systolic function is normal  in the base. The right ventricular size is moderately enlarged.  3. The mitral valve is grossly normal. No evidence of mitral valve  regurgitation.  4. The aortic valve is tricuspid. Aortic valve regurgitation is not  visualized. No aortic stenosis is present.  5. There is mild dilatation of the ascending aorta, measuring 41 mm.   Comparison(s): No prior Echocardiogram. Discussed finding with critical  care team.   Antimicrobials:  Anti-infectives (From admission, onward)   Start     Dose/Rate Route Frequency Ordered Stop   06/26/20 2000  cefTRIAXone (ROCEPHIN) 2 g in sodium chloride 0.9 % 100 mL IVPB  Status:  Discontinued        2 g 200 mL/hr over 30 Minutes Intravenous Every 24 hours 06/26/20 1818 07/01/20 1552   06/25/20 1900  Ampicillin-Sulbactam (UNASYN) 3 g in sodium chloride 0.9 % 100 mL IVPB  Status:  Discontinued        3 g 200 mL/hr over 30 Minutes Intravenous Every 12 hours 06/25/20 1857 06/26/20 1818   06/25/20 1700  cefTRIAXone (ROCEPHIN) 1 g in sodium chloride 0.9 %  100 mL IVPB        1 g 200 mL/hr over 30 Minutes Intravenous  Once 06/25/20 1649 06/25/20 1859   06/25/20 1700  azithromycin (ZITHROMAX) 500 mg in sodium chloride 0.9 % 250 mL IVPB        500 mg 250 mL/hr over 60 Minutes Intravenous  Once 06/25/20 1649 06/25/20 2005      Subjective: No complaints today  Objective: Vitals:   07/13/20 0500 07/13/20 1212 07/13/20 1234 07/13/20 1457  BP: (!) 129/58 109/67  114/75  Pulse: 60   93  Resp: 14 (!) 28  (!) 28  Temp: 98.6 F (37 C) 97.6 F (36.4 C)  98.4 F (36.9 C)  TempSrc: Oral Oral  Oral  SpO2: 96%  98% 96%  Weight: (!) 226.3 kg     Height:        Intake/Output Summary (Last 24 hours) at 07/13/2020 1704 Last data filed at 07/13/2020 1357 Gross per 24 hour  Intake 240 ml  Output 2800  ml  Net -2560 ml   Filed Weights   07/10/20 0500 07/11/20 0500 07/13/20 0500  Weight: (!) 228.6 kg (!) 228.6 kg (!) 226.3 kg    Examination:  General: No acute distress. Cardiovascular: Heart sounds show Sharina Petre regular rate, and rhythm Lungs: Clear to auscultation bilaterally  Abdomen: Soft, nontender, nondistended  Neurological: Alert and oriented 3. Moves all extremities 4. Cranial nerves II through XII grossly intact. Skin: Warm and dry. No rashes or lesions. Extremities: No clubbing or cyanosis. No edema.   Data Reviewed: I have personally reviewed following labs and imaging studies  CBC: Recent Labs  Lab 07/09/20 0354 07/10/20 0354 07/11/20 0350 07/12/20 0440 07/13/20 0432  WBC 7.5 6.1 6.2 6.0 5.7  NEUTROABS 5.6 4.3 4.4 4.1 3.9  HGB 10.1* 10.0* 9.8* 9.7* 10.0*  HCT 36.3 35.9* 35.5* 34.7* 36.7  MCV 85.0 84.5 87.7 85.7 85.9  PLT 276 217 295 266 277    Basic Metabolic Panel: Recent Labs  Lab 07/09/20 0354 07/10/20 0354 07/11/20 0350 07/12/20 0440 07/12/20 1552 07/13/20 0432  NA 138 139 141 140 140 140  K 3.9 3.5 3.9 2.9* 3.6 3.4*  CL 96* 98 102 99 98 102  CO2 36* 36* 35* 33* 38* 31  GLUCOSE 99 90 99 97 108* 99  BUN 6 6 8 8 10 9   CREATININE 0.72 0.80 0.87 0.74 0.94 0.79  CALCIUM 8.0* 8.1* 7.8* 8.2* 8.5* 8.2*  MG 1.7 1.7 2.0 2.2  --  1.9  PHOS  --  4.4 4.0 3.9  --  3.7    GFR: Estimated Creatinine Clearance: 165.6 mL/min (by C-G formula based on SCr of 0.79 mg/dL).  Liver Function Tests: Recent Labs  Lab 07/10/20 0354 07/11/20 0350 07/12/20 0440 07/13/20 0432  AST 28 46* 28 29  ALT 32 28 27 27   ALKPHOS 53 51 51 55  BILITOT 0.9 1.1 0.7 0.7  PROT 6.2* 6.1* 6.1* 6.2*  ALBUMIN 2.4* 2.4* 2.5* 2.6*    CBG: Recent Labs  Lab 07/12/20 0755 07/12/20 1302 07/12/20 1622 07/13/20 0734 07/13/20 1209  GLUCAP 96 119* 116* 89 111*     No results found for this or any previous visit (from the past 240 hour(s)).       Radiology Studies: No  results found.      Scheduled Meds: . (feeding supplement) PROSource Plus  30 mL Oral TID BM  . acetaZOLAMIDE  250 mg Oral BID  . furosemide  40 mg Intravenous  BID  . magnesium oxide  800 mg Oral BID  . mouth rinse  15 mL Mouth Rinse BID  . multivitamin with minerals  1 tablet Oral Daily  . pantoprazole  40 mg Oral QHS  . potassium chloride  40 mEq Oral Daily  . Ensure Max Protein  11 oz Oral BID  . Rivaroxaban  15 mg Oral BID WC   Followed by  . [START ON 07/22/2020] rivaroxaban  20 mg Oral Q supper   Continuous Infusions:   LOS: 18 days    Time spent: over 30 min    Lacretia Nicks, MD [facility]   To contact the attending provider between 7A-7P or the covering provider during after hours 7P-7A, please log into the web site [email or web address] and access using universal [place] password for that web site. If you do not have the password, please call the hospital operator.  07/13/2020, 5:04 PM

## 2020-07-14 DIAGNOSIS — G4733 Obstructive sleep apnea (adult) (pediatric): Secondary | ICD-10-CM

## 2020-07-14 DIAGNOSIS — I2699 Other pulmonary embolism without acute cor pulmonale: Secondary | ICD-10-CM

## 2020-07-14 DIAGNOSIS — E662 Morbid (severe) obesity with alveolar hypoventilation: Secondary | ICD-10-CM

## 2020-07-14 DIAGNOSIS — I5033 Acute on chronic diastolic (congestive) heart failure: Secondary | ICD-10-CM

## 2020-07-14 LAB — CBC WITH DIFFERENTIAL/PLATELET
Abs Immature Granulocytes: 0.03 10*3/uL (ref 0.00–0.07)
Basophils Absolute: 0 10*3/uL (ref 0.0–0.1)
Basophils Relative: 0 %
Eosinophils Absolute: 0.2 10*3/uL (ref 0.0–0.5)
Eosinophils Relative: 5 %
HCT: 37.3 % (ref 36.0–46.0)
Hemoglobin: 10.3 g/dL — ABNORMAL LOW (ref 12.0–15.0)
Immature Granulocytes: 1 %
Lymphocytes Relative: 19 %
Lymphs Abs: 1 10*3/uL (ref 0.7–4.0)
MCH: 23.7 pg — ABNORMAL LOW (ref 26.0–34.0)
MCHC: 27.6 g/dL — ABNORMAL LOW (ref 30.0–36.0)
MCV: 85.9 fL (ref 80.0–100.0)
Monocytes Absolute: 0.5 10*3/uL (ref 0.1–1.0)
Monocytes Relative: 9 %
Neutro Abs: 3.6 10*3/uL (ref 1.7–7.7)
Neutrophils Relative %: 66 %
Platelets: 287 10*3/uL (ref 150–400)
RBC: 4.34 MIL/uL (ref 3.87–5.11)
RDW: 18.8 % — ABNORMAL HIGH (ref 11.5–15.5)
WBC: 5.3 10*3/uL (ref 4.0–10.5)
nRBC: 0 % (ref 0.0–0.2)

## 2020-07-14 LAB — COMPREHENSIVE METABOLIC PANEL
ALT: 26 U/L (ref 0–44)
AST: 28 U/L (ref 15–41)
Albumin: 2.7 g/dL — ABNORMAL LOW (ref 3.5–5.0)
Alkaline Phosphatase: 57 U/L (ref 38–126)
Anion gap: 5 (ref 5–15)
BUN: 11 mg/dL (ref 6–20)
CO2: 32 mmol/L (ref 22–32)
Calcium: 8.4 mg/dL — ABNORMAL LOW (ref 8.9–10.3)
Chloride: 103 mmol/L (ref 98–111)
Creatinine, Ser: 0.87 mg/dL (ref 0.44–1.00)
GFR, Estimated: >60 mL/min (ref >60)
Glucose, Bld: 101 mg/dL — ABNORMAL HIGH (ref 70–99)
Potassium: 3.5 mmol/L (ref 3.5–5.1)
Sodium: 140 mmol/L (ref 135–145)
Total Bilirubin: 0.7 mg/dL (ref 0.3–1.2)
Total Protein: 6.4 g/dL — ABNORMAL LOW (ref 6.5–8.1)

## 2020-07-14 LAB — GLUCOSE, CAPILLARY
Glucose-Capillary: 87 mg/dL (ref 70–99)
Glucose-Capillary: 118 mg/dL — ABNORMAL HIGH (ref 70–99)
Glucose-Capillary: 117 mg/dL — ABNORMAL HIGH (ref 70–99)

## 2020-07-14 LAB — MAGNESIUM: Magnesium: 2 mg/dL (ref 1.7–2.4)

## 2020-07-14 LAB — PHOSPHORUS: Phosphorus: 4.1 mg/dL (ref 2.5–4.6)

## 2020-07-14 MED ORDER — TORSEMIDE 20 MG PO TABS
40.0000 mg | ORAL_TABLET | Freq: Two times a day (BID) | ORAL | Status: DC
  Administered 2020-07-15 – 2020-07-25 (×21): 40 mg via ORAL
  Filled 2020-07-14 (×22): qty 2

## 2020-07-14 NOTE — Progress Notes (Signed)
Placed patient on BIPAP for the night.  

## 2020-07-14 NOTE — Progress Notes (Signed)
PROGRESS NOTE    Felicia Warren  YQI:[id number] DOB: [birth year] DOA: 06/25/2020 PCP: Pcp, No   Chief Complaint  Patient presents with  . unresponsive   Brief Narrative:  50 yo F who presented initially as Felicia Warren, found with altered mental status in [place] motel by staff. It is unknown when her last known well was. EMS found patient awake, but altered with aglucoseof40. This was corrected, but she continued to have worsening mental status. Narcan did not provide any response. In ED she required nonrebreather mask and eventual intubation for hypoxic respiratory failure. Chest x-ray with probable multifocal pneumonia. AKI, elevated INR on admit. No prior history in epicas she is from [facility] no known emergency contact.Admitted by PCCM given requirement for airway protection/intubation.   5/4 presented to ED with hypoglycemia and AMS, required intubation, PCCM consult. CXR concerning for PNA. Unasyn initiated. Tx to [facility] for beds. UDS neg, neg tylenol/ salicylates. CTH neg. Lactic acid 5.9->4.9  5/5 Hypotensive, responded to IVF + albumin, on heparin gtt but stopped due to platelet decline, not on pressors, requiring PEEP 10 / FiO2 80. RUQ [place] with cholelithiasis but no cholecystitis.   5/6 Vent needs decreased. LE venous duplex negative.   5/7 diarrhea > Placed flexiseal 5/8  5/8 WUA/SBT, extubated  Assessment & Plan:   Principal Problem:   Acute respiratory failure with hypoxia and hypercarbia (HCC) Active Problems:   Hypercapnic respiratory failure (HCC)   Elevated LFTs   Acute metabolic encephalopathy   Pulmonary emboli (HCC)  Acute on chronic hypoxic hypercarbic respiratory failure Probable OSA/OHS - Rapid response on 5/12 and 5/13 evenings due to inc WOB/desats - starting to tolerate spacing out BiPAP more; goal is to get on just nightly BiPAP (or none at all would be ideal) - saturation goal 88-92%, wean O2 accordingly - appreciate pulmonology following  again - continue lasix -> transition to torsemide PO  - diamox added per pulmonology, continue for now - net negative 23.8\ L - follow up RD eval for possible calorie restrictions - seal of BiPAP also remains subpar at times  - will need outpatient sleep study  Acute Metabolic Encephalopathy  - etiology 2/2 chronic CO2 retention - VBG with compensated resp acidosis - 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 Needs an outpatient sleep study at some point - at risk for needing Felicia Warren trach if ongoing need for BiPAP intermittently as well as if she cannot lose some weight to help   Pulmonary Embolism - CT PE protocol from 5/9 with area suspicious for segmental thrombus in L lower lobe associated with aispace disease, likely pulm infarct - imaging with findings concerning for right heart strain - echo with mcconnell's sign (see report) - LE [place] negative for DVT -Transition to xarelto - discussed with pharmacy  AKI, undetermined acuity/unkown baseline, resolved AGMA/lactic acidosis, resolved Hypokalemia, resolved Contraction alkalosis - Trend BMP / urinary output - Avoid nephrotoxic agents - continue diamox; see discussion above   Hypokalemia - replace, follow  Social barriers - patient from [place] and uninsured; her wish is to return to [place] to [place] belongings and move to [place] as she has family here, however she has nowhere here to live yet if she moved - not sure if family would take her in after this hospitalization but this has been brought up to them and it didn't appear to be an option - if she's needing nightly BIPAP, that will be Felicia Warren factor; and/or if she needs continuous O2 that will also be Felicia Warren factor -  mobility extremely limited; PT following; staff needs to try and get her OOB to chair with meals as able - until these issues are fixed and/or addressed, she will remain in the hospital Felicia Warren prolonged amount of time  Demand ischemia - Troponin downtrending with oxygenation - Echo =LVEF  55-60%, no WMA, McConnell's sign with moderately enlarged RV, interventricular septal flattening concerning for RV overload (likely due to OHS as above) - Suspected LLL segmental PE as above - on xarelto  Elevated liver enzymes - resolving  - Cannot rule out shock liver given profound hypoxia -RUQ [place] negative. Acute hepatitis panel negative. - Labs downtrending appropriately  Hypoglycemia-resolved -Glucose in 40's on admit, no available hx -Resolved with dextrose infusion  Diarrhea, resolving -Likely in setting of TF + bowel regimen  -PRN colace, miralax  Anemia, normocytic - likely chronic anemia of chronic disease given above -trend CBC  -transfuse for Hgb <7% -follow anemia labs - c/w aocd  DVT prophylaxis: xarelto Code Status: full  Family Communication: sisters at bedside Disposition:   Status is: Inpatient  Remains inpatient appropriate because:Inpatient level of care appropriate due to severity of illness   Dispo: The patient is from: Home              Anticipated d/c is to: pending              Patient currently is not medically stable to d/c.   Difficult to place patient Yes  Consultants:   Pulmonology  Procedures:  LE [place] Summary:  BILATERAL:  -No evidence of popliteal cyst, bilaterally.  RIGHT:  - There is no evidence of deep vein thrombosis in the lower extremity.  However, portions of this examination were limited- see technologist  comments above.    LEFT:  - There is no evidence of deep vein thrombosis in the lower extremity.  However, portions of this examination were limited- see technologist  comments above.     *See table(s) above for measurements and observations.   Echo IMPRESSIONS    1. Left ventricular ejection fraction, by estimation, is 55 to 60%. The  left ventricle has normal function. The left ventricle has no regional  wall motion abnormalities. Left ventricular diastolic parameters were  normal. There is the  interventricular  septum is flattened in systole and diastole, consistent with right  ventricular pressure and volume overload.  2. McConnell's Sign noted. Right ventricular systolic function is normal  in the base. The right ventricular size is moderately enlarged.  3. The mitral valve is grossly normal. No evidence of mitral valve  regurgitation.  4. The aortic valve is tricuspid. Aortic valve regurgitation is not  visualized. No aortic stenosis is present.  5. There is mild dilatation of the ascending aorta, measuring 41 mm.   Comparison(s): No prior Echocardiogram. Discussed finding with critical  care team.   Antimicrobials:  Anti-infectives (From admission, onward)   Start     Dose/Rate Route Frequency Ordered Stop   06/26/20 2000  cefTRIAXone (ROCEPHIN) 2 g in sodium chloride 0.9 % 100 mL IVPB  Status:  Discontinued        2 g 200 mL/hr over 30 Minutes Intravenous Every 24 hours 06/26/20 1818 07/01/20 1552   06/25/20 1900  Ampicillin-Sulbactam (UNASYN) 3 g in sodium chloride 0.9 % 100 mL IVPB  Status:  Discontinued        3 g 200 mL/hr over 30 Minutes Intravenous Every 12 hours 06/25/20 1857 06/26/20 1818   06/25/20 1700  cefTRIAXone (ROCEPHIN)  1 g in sodium chloride 0.9 % 100 mL IVPB        1 g 200 mL/hr over 30 Minutes Intravenous  Once 06/25/20 1649 06/25/20 1859   06/25/20 1700  azithromycin (ZITHROMAX) 500 mg in sodium chloride 0.9 % 250 mL IVPB        500 mg 250 mL/hr over 60 Minutes Intravenous  Once 06/25/20 1649 06/25/20 2005      Subjective: No new complaints  Objective: Vitals:   07/14/20 0500 07/14/20 0512 07/14/20 0804 07/14/20 1300  BP:  (!) 125/53  114/79  Pulse:  67  81  Resp:  (!) 26 (!) 22 (!) 22  Temp:  99 F (37.2 C) 98.2 F (36.8 C) 97.7 F (36.5 C)  TempSrc:  Axillary Oral Oral  SpO2:  95%    Weight: (!) 217.7 kg     Height:        Intake/Output Summary (Last 24 hours) at 07/14/2020 1418 Last data filed at 07/14/2020 5597 Gross per  24 hour  Intake --  Output 2100 ml  Net -2100 ml   Filed Weights   07/11/20 0500 07/13/20 0500 07/14/20 0500  Weight: (!) 228.6 kg (!) 226.3 kg (!) 217.7 kg    Examination:  General: No acute distress. Cardiovascular: Heart sounds show Keena Heesch regular rate, and rhythm Lungs: Clear to auscultation bilaterally  Abdomen: Soft, nontender, nondistended  Neurological: Alert and oriented 3. Moves all extremities 4. Cranial nerves II through XII grossly intact. Skin: Warm and dry. No rashes or lesions. Extremities: trace pitting edema, nonpitting edema present as well    Data Reviewed: I have personally reviewed following labs and imaging studies  CBC: Recent Labs  Lab 07/10/20 0354 07/11/20 0350 07/12/20 0440 07/13/20 0432 07/14/20 0415  WBC 6.1 6.2 6.0 5.7 5.3  NEUTROABS 4.3 4.4 4.1 3.9 3.6  HGB 10.0* 9.8* 9.7* 10.0* 10.3*  HCT 35.9* 35.5* 34.7* 36.7 37.3  MCV 84.5 87.7 85.7 85.9 85.9  PLT 217 295 266 277 287    Basic Metabolic Panel: Recent Labs  Lab 07/10/20 0354 07/11/20 0350 07/12/20 0440 07/12/20 1552 07/13/20 0432 07/14/20 0415  NA 139 141 140 140 140 140  K 3.5 3.9 2.9* 3.6 3.4* 3.5  CL 98 102 99 98 102 103  CO2 36* 35* 33* 38* 31 32  GLUCOSE 90 99 97 108* 99 101*  BUN 6 8 8 10 9 11   CREATININE 0.80 0.87 0.74 0.94 0.79 0.87  CALCIUM 8.1* 7.8* 8.2* 8.5* 8.2* 8.4*  MG 1.7 2.0 2.2  --  1.9 2.0  PHOS 4.4 4.0 3.9  --  3.7 4.1    GFR: Estimated Creatinine Clearance: 148.2 mL/min (by C-G formula based on SCr of 0.87 mg/dL).  Liver Function Tests: Recent Labs  Lab 07/10/20 0354 07/11/20 0350 07/12/20 0440 07/13/20 0432 07/14/20 0415  AST 28 46* 28 29 28   ALT 32 28 27 27 26   ALKPHOS 53 51 51 55 57  BILITOT 0.9 1.1 0.7 0.7 0.7  PROT 6.2* 6.1* 6.1* 6.2* 6.4*  ALBUMIN 2.4* 2.4* 2.5* 2.6* 2.7*    CBG: Recent Labs  Lab 07/13/20 0734 07/13/20 1209 07/13/20 1741 07/14/20 0739 07/14/20 1318  GLUCAP 89 111* 105* 87 118*     No results found for  this or any previous visit (from the past 240 hour(s)).       Radiology Studies: No results found.      Scheduled Meds: . (feeding supplement) PROSource Plus  30 mL Oral TID  BM  . acetaZOLAMIDE  250 mg Oral BID  . furosemide  40 mg Intravenous BID  . magnesium oxide  800 mg Oral BID  . mouth rinse  15 mL Mouth Rinse BID  . multivitamin with minerals  1 tablet Oral Daily  . pantoprazole  40 mg Oral QHS  . potassium chloride  40 mEq Oral Daily  . Ensure Max Protein  11 oz Oral BID  . Rivaroxaban  15 mg Oral BID WC   Followed by  . [START ON 07/22/2020] rivaroxaban  20 mg Oral Q supper   Continuous Infusions:   LOS: 19 days    Time spent: over 30 min    Lacretia Nicksaldwell Powell, MD [facility]   To contact the attending provider between 7A-7P or the covering provider during after hours 7P-7A, please log into the web site [email or web address] and access using universal [place] password for that web site. If you do not have the password, please call the hospital operator.  07/14/2020, 2:18 PM

## 2020-07-14 NOTE — TOC Progression Note (Signed)
Transition of Care [facility]) - Progression Note    Patient Details  Name: Felicia Warren MRN: [id number] Date of Birth: 03/01/1970  Transition of [facility]) CM/SW Contact  Darleene Cleaver, [place] Phone Number: 07/14/2020, 4:50 PM  Clinical Narrative:     Csw received a phone call from [facility] from [employer].  She said patient's case was deferred to Judeth Cornfield to make a referral to [facility] and complete application for Adult Medicaid.  Per Judeth Cornfield, patient has a good chance of being approved for disability due to her high oxygen needs and medical diagnosis.     Expected Discharge Plan: Homeless Shelter Barriers to Discharge: Continued Medical Work up  Expected Discharge Plan and Services Expected Discharge Plan: Homeless Shelter       Living arrangements for the past 2 months: Hotel/Motel                                       Social Determinants of Health (SDOH) Interventions    Readmission Risk Interventions No flowsheet data found.

## 2020-07-14 NOTE — Progress Notes (Addendum)
   07/14/20 0535  Rapid Response Notification  Name of Rapid Response RN Notified Danielle  Date Rapid Response Notified 07/14/20  Time Rapid Response Notified 0535  The pt has a MEWS of 2 d/t respirations. This has been pt baseline. Made Rapid aware that MD to be paged with MEWS. Rapid response is aware also that Pt has a low grade temp 99.33F. Highest is has been over 72 hour time period.  Rapid team to be called again if pt conditions warrants.  Protocol for MEWS initiated.

## 2020-07-14 NOTE — Progress Notes (Signed)
Nutrition Follow-up  DOCUMENTATION CODES:   Morbid obesity  INTERVENTION:   -Continue 30 ml Prosource Plus TID, each supplement provides 100 kcal and 15 grams protein.  -Continue Ensure Max BID, each supplement provides 150 kcal and 30 grams protein.   NUTRITION DIAGNOSIS:   Increased nutrient needs related to acute illness as evidenced by estimated needs.  Ongoing.  GOAL:   Patient will meet greater than or equal to 90% of their needs  Progressing.  MONITOR:   PO intake,Supplement acceptance,Labs,Weight trends,Skin  ASSESSMENT:   51 year-old female who presented as a Felicia Warren, found with AMS in a motel by staff.  It is unknown when her last known well was.  EMS found patient awake by CBG of 40 mg/dl.  This was corrected, but she continued to have worsening mental status; Narcan did not provide any response.  In ED she required NRB mask and eventual intubation for hypoxic respiratory failure.  CXR with probable multifocal PNA.  Last recorded PO intake was 75% of breakfast yesterday 5/22. Has not been drinking Ensure Max until this morning. Has been accepting Prosource.   Admission weight: 420 lbs. Current weight: 480 lbs  I/Os: -23.8L since 5/9 UOP: 3.3L  Medications: MAG-OX, Multivitamin with minerals daily, KLOR-CON  Labs reviewed: CBGs: 87-118  Diet Order:   Diet Order            DIET DYS 2 Room service appropriate? Yes; Fluid consistency: Thin  Diet effective now                 EDUCATION NEEDS:   Education needs have been addressed  Skin:  Skin Assessment: Skin Integrity Issues: Skin Integrity Issues:: Other (Comment) Other: MASD + MARS to bilateral lumbar area  Last BM:  3/16 (type 6 x1)  Height:   Ht Readings from Last 1 Encounters:  06/25/20 5\' 6"  (1.676 m)    Weight:   Wt Readings from Last 1 Encounters:  07/14/20 (!) 217.7 kg   BMI:  Body mass index is 77.47 kg/m.  Estimated Nutritional Needs:   Kcal:  1775-2070 kcal (30-35  kcal/kg IBW)  Protein:  125-140 grams  Fluid:  >/= 1.5 L/day  07/16/20, MS, RD, LDN Inpatient Clinical Dietitian Contact information available via Amion

## 2020-07-14 NOTE — Progress Notes (Signed)
NAME:  Felicia Warren, MRN:  [id number], DOB:  12/29/1970, LOS: 19 ADMISSION DATE:  06/25/2020, CONSULTATION DATE:  07/14/20 REFERRING MD:  EDP, CHIEF COMPLAINT:  AMS   History of Present Illness:  50 y/o F who presented initially as a Felicia Warren, found with altered mental status in a motel by staff.  It is unknown when her last known well was.  EMS found patient awake, but altered with a glucose of 40.  This was corrected, but she continued to have worsening mental status.  Narcan did not provide any response.  In ED she required nonrebreather mask and eventual intubation for hypoxic respiratory failure.  Chest x-ray with probable multifocal pneumonia.  AKI, elevated INR on admit.  No prior history in epic and no known emergency contact. Work up consistent with RV overload, suspected OHS / OSA, LLL segmental PE on heparin.  AKI resolved.   Pertinent  Medical History  Obesity ?  OSA-undiagnosed  Significant Hospital Events: Including procedures, antibiotic start and stop dates in addition to other pertinent events    5/4 presented to ED with hypoglycemia and AMS, required intubation, PCCM consult. CXR concerning for PNA.  Unasyn initiated. Tx to [facility] for beds. UDS neg, neg tylenol/ salicylates. CTH neg. Lactic acid 5.9-> 4.9 . 5/5 Hypotensive, responded to IVF + albumin, on heparin gtt but stopped due to platelet decline, not on pressors, requiring PEEP 10 / FiO2 80. RUQ [place] with cholelithiasis but no cholecystitis.   Marland Kitchen 5/6 Vent needs decreased. LE venous duplex negative.  . 5/7 diarrhea > Placed flexiseal 5/8  5/8 WUA/SBT, extubated  5/9 CTA Chest with LLL segmental PE with associated airspace disease and pulmonary infarct, dilation of the main pulmonary artery up to 3.2cm.  To TRH.   5/16 started standing diamox   More alert and interactive, tolerating supplemental oxygen  Interim History / Subjective:  No acute overnight events.  Using BiPAP.  Awake alert and oriented.  Had nosebleeds with  high oxygen needs. Objective   Blood pressure 115/80, pulse 81, temperature 97.9 F (36.6 C), temperature source Oral, resp. rate (!) 22, height 5\' 6"  (1.676 m), weight (!) 217.7 kg, SpO2 96 %.    FiO2 (%):  [28 %] 28 %   Intake/Output Summary (Last 24 hours) at 07/14/2020 1814 Last data filed at 07/14/2020 07/16/2020 Gross per 24 hour  Intake --  Output 2100 ml  Net -2100 ml   Filed Weights   07/11/20 0500 07/13/20 0500 07/14/20 0500  Weight: (!) 228.6 kg (!) 226.3 kg (!) 217.7 kg   Exam:  Middle-age, morbidly obese, on nasal cannula HENT: Moist oral mucosa Pulm: Decreased bilaterally no wheezes or crackles Card: Regular rate and rhythm Extremities: Trace pedal edema  Labs/imaging that I havepersonally reviewed  (right click and "Reselect all SmartList Selections" daily)  Sodium 140, CO2 32, creatinine 0.87, hemoglobin 10.3  Resolved Hospital Problem list   Hypotension / Shock  Thrombocytopenia AKI Acute metabolic (hypercarbic related) encephalopathy  Demand ischemia  Hypoglycemia   Assessment & Plan:   Acute on chronic respiratory failure Undiagnosed obstructive sleep apnea/obesity hypoventilation Acute segmental pulmonary embolism Acute on chronic cor pulmonale   Continue oxygen supplementation Continue BiPAP at night and with naps during the day as needed Agree with diuresis Continue anticoagulation  She obviously needs some kind of noninvasive ventilation at discharge, but not sure how this will be accomplished with her current intern status.  Appreciate TOC involvement.  PCCM is available as needed.  Please  not hesitate to contact us with any questions sooner if we can be of more assistance.  Durel Salts, MD Pulmonary and Critical Care Medicine [facility]

## 2020-07-14 NOTE — Progress Notes (Addendum)
   07/14/20 0540  Provider Notification  Provider Name/Title Linton Flemings, NP  Date Provider Notified 07/14/20  Time Provider Notified [phone]  Notification Type Page  Notification Reason Other (Comment) (MEWS Yellow (2))  Provider response Other (Comment) (Awaiting md response)  The pt has a MEWS of 2 d/t respirations. This has been pt baseline. MD paged with MEWS. Rapid response is aware. Pt has a low grade temp 99.3F. Highest is has been over 72 hour time period.

## 2020-07-15 LAB — COMPREHENSIVE METABOLIC PANEL
ALT: 21 U/L (ref 0–44)
AST: 32 U/L (ref 15–41)
Albumin: 2.7 g/dL — ABNORMAL LOW (ref 3.5–5.0)
Alkaline Phosphatase: 56 U/L (ref 38–126)
Anion gap: 8 (ref 5–15)
BUN: 11 mg/dL (ref 6–20)
CO2: 29 mmol/L (ref 22–32)
Calcium: 8.5 mg/dL — ABNORMAL LOW (ref 8.9–10.3)
Chloride: 102 mmol/L (ref 98–111)
Creatinine, Ser: 0.82 mg/dL (ref 0.44–1.00)
GFR, Estimated: >60 mL/min (ref >60)
Glucose, Bld: 98 mg/dL (ref 70–99)
Potassium: 3.9 mmol/L (ref 3.5–5.1)
Sodium: 139 mmol/L (ref 135–145)
Total Bilirubin: 0.6 mg/dL (ref 0.3–1.2)
Total Protein: 6.5 g/dL (ref 6.5–8.1)

## 2020-07-15 LAB — CBC WITH DIFFERENTIAL/PLATELET
Abs Immature Granulocytes: 0.03 10*3/uL (ref 0.00–0.07)
Basophils Absolute: 0 10*3/uL (ref 0.0–0.1)
Basophils Relative: 1 %
Eosinophils Absolute: 0.2 10*3/uL (ref 0.0–0.5)
Eosinophils Relative: 3 %
HCT: 37 % (ref 36.0–46.0)
Hemoglobin: 10.2 g/dL — ABNORMAL LOW (ref 12.0–15.0)
Immature Granulocytes: 1 %
Lymphocytes Relative: 22 %
Lymphs Abs: 1.2 10*3/uL (ref 0.7–4.0)
MCH: 23.7 pg — ABNORMAL LOW (ref 26.0–34.0)
MCHC: 27.6 g/dL — ABNORMAL LOW (ref 30.0–36.0)
MCV: 85.8 fL (ref 80.0–100.0)
Monocytes Absolute: 0.4 10*3/uL (ref 0.1–1.0)
Monocytes Relative: 7 %
Neutro Abs: 3.6 10*3/uL (ref 1.7–7.7)
Neutrophils Relative %: 66 %
Platelets: 287 10*3/uL (ref 150–400)
RBC: 4.31 MIL/uL (ref 3.87–5.11)
RDW: 18.9 % — ABNORMAL HIGH (ref 11.5–15.5)
WBC: 5.4 10*3/uL (ref 4.0–10.5)
nRBC: 0 % (ref 0.0–0.2)

## 2020-07-15 LAB — PHOSPHORUS: Phosphorus: 4.1 mg/dL (ref 2.5–4.6)

## 2020-07-15 LAB — GLUCOSE, CAPILLARY
Glucose-Capillary: 96 mg/dL (ref 70–99)
Glucose-Capillary: 106 mg/dL — ABNORMAL HIGH (ref 70–99)
Glucose-Capillary: 105 mg/dL — ABNORMAL HIGH (ref 70–99)

## 2020-07-15 LAB — MAGNESIUM: Magnesium: 2.2 mg/dL (ref 1.7–2.4)

## 2020-07-15 NOTE — Progress Notes (Signed)
PROGRESS NOTE    Felicia Warren  JGG:[id number] DOB: 11-29-1970 DOA: 06/25/2020 PCP: Pcp, No   Chief Complaint  Patient presents with  . unresponsive   Brief Narrative:  50 yo F who presented initially as Felicia Warren, found with altered mental status in [place] by staff. It is unknown when her last known well was. EMS found patient awake, but altered with [id number]. This was corrected, but she continued to have worsening mental status. Narcan did not provide any response. In ED she required nonrebreather mask and eventual intubation for hypoxic respiratory failure. Chest x-ray with probable multifocal pneumonia. AKI, elevated INR on admit. No prior history in epicas she is from [facility] no known emergency contact.Admitted by PCCM given requirement for airway protection/intubation.   5/4 presented to ED with hypoglycemia and AMS, required intubation, PCCM consult. CXR concerning for PNA. Unasyn initiated. Tx to [facility] for beds. UDS neg, neg tylenol/ salicylates. CTH neg. Lactic acid 5.9->4.9  5/5 Hypotensive, responded to IVF + albumin, on heparin gtt but stopped due to platelet decline, not on pressors, requiring PEEP 10 / FiO2 80. RUQ [place] with cholelithiasis but no cholecystitis.   5/6 Vent needs decreased. LE venous duplex negative.   5/7 diarrhea > Placed flexiseal 5/8  5/8 WUA/SBT, extubated  She's currently being diuresed.  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  No insurance.  SW looking into adult medicaid.  Assessment & Plan:   Principal Problem:   Acute respiratory failure with hypoxia and hypercarbia (HCC) Active Problems:   Hypercapnic respiratory failure (HCC)   Elevated LFTs   Acute metabolic encephalopathy   Pulmonary emboli (HCC)  Acute on chronic hypoxic hypercarbic respiratory failure Probable OSA/OHS - Rapid response on 5/12 and 5/13 evenings due to inc WOB/desats - starting to tolerate spacing out BiPAP more;  goal is to get on just nightly BiPAP (or none at all would be ideal) - saturation goal 88-92%, wean O2 accordingly - appreciate pulmonology following again - continue lasix -> transition to torsemide PO  - metabolic alkalosis improved, will stop diamox - net negative 27.2 L - follow up RD eval for possible calorie restrictions - seal of BiPAP also remains subpar at times  - will need outpatient sleep study  Acute Metabolic Encephalopathy  - etiology 2/2 chronic CO2 retention - VBG with compensated resp acidosis - 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 Needs an outpatient sleep study at some point - at risk for needing Felicia Warren if ongoing need for BiPAP intermittently as well as if she cannot lose some weight to help   Pulmonary Embolism - CT PE protocol from 5/9 with area suspicious for segmental thrombus in L lower lobe associated with aispace disease, likely pulm infarct - imaging with findings concerning for right heart strain - echo with mcconnell's sign (see report) - LE [place] negative for DVT -Transitioned to xarelto   AKI, undetermined acuity/unkown baseline, resolved AGMA/lactic acidosis, resolved Hypokalemia, resolved Contraction alkalosis - Trend BMP / urinary output - Avoid nephrotoxic agents - diamox now on hold as noted above   Hypokalemia - replace, follow  Social barriers - patient from [place] and uninsured; her wish is to return to [place] to [place] belongings and move to [place] as she has family here, however she has nowhere here to live yet if she moved - not sure if family would take her in after this hospitalization but this has been brought up to them and it didn't appear to be an  option - if she's needing nightly BIPAP, that will be [name] factor; and/or if she needs continuous O2 that will also be [name] factor - mobility extremely limited; PT following; staff needs to try and get her OOB to chair with meals as able - until these issues are fixed and/or addressed, she will  remain in the hospital [name] prolonged amount of time  Demand ischemia - Troponin downtrending with oxygenation - Echo =LVEF 55-60%, no WMA, McConnell's sign with moderately enlarged RV, interventricular septal flattening concerning for RV overload (likely due to OHS as above) - Suspected LLL segmental PE as above - on xarelto  Elevated liver enzymes - - Cannot rule out shock liver given profound hypoxia -RUQ [place] negative. Acute hepatitis panel negative. - improved  Hypoglycemia-resolved -Glucose in 40's on admit, no available hx -Resolved with dextrose infusion  Diarrhea, resolving -Likely in setting of TF + bowel regimen  -PRN colace, miralax  Anemia, normocytic - likely chronic anemia of chronic disease given above -trend CBC  -transfuse for Hgb <7% -follow anemia labs - c/w aocd  DVT prophylaxis: xarelto Code Status: full  Family Communication: none at bedside Disposition:   Status is: Inpatient  Remains inpatient appropriate because:Inpatient level of care appropriate due to severity of illness   Dispo: The patient is from: Home              Anticipated d/c is to: pending              Patient currently is not medically stable to d/c.   Difficult to place patient Yes  Consultants:   Pulmonology  Procedures:  LE [place] Summary:  BILATERAL:  -No evidence of popliteal cyst, bilaterally.  RIGHT:  - There is no evidence of deep vein thrombosis in the lower extremity.  However, portions of this examination were limited- see technologist  comments above.    LEFT:  - There is no evidence of deep vein thrombosis in the lower extremity.  However, portions of this examination were limited- see technologist  comments above.     *See table(s) above for measurements and observations.   Echo IMPRESSIONS    1. Left ventricular ejection fraction, by estimation, is 55 to 60%. The  left ventricle has normal function. The left ventricle has no regional   wall motion abnormalities. Left ventricular diastolic parameters were  normal. There is the interventricular  septum is flattened in systole and diastole, consistent with right  ventricular pressure and volume overload.  2. McConnell's Sign noted. Right ventricular systolic function is normal  in the base. The right ventricular size is moderately enlarged.  3. The mitral valve is grossly normal. No evidence of mitral valve  regurgitation.  4. The aortic valve is tricuspid. Aortic valve regurgitation is not  visualized. No aortic stenosis is present.  5. There is mild dilatation of the ascending aorta, measuring 41 mm.   Comparison(s): No prior Echocardiogram. Discussed finding with critical  care team.   Antimicrobials:  Anti-infectives (From admission, onward)   Start     Dose/Rate Route Frequency Ordered Stop   06/26/20 2000  cefTRIAXone (ROCEPHIN) 2 g in sodium chloride 0.9 % 100 mL IVPB  Status:  Discontinued        2 g 200 mL/hr over 30 Minutes Intravenous Every 24 hours 06/26/20 1818 07/01/20 1552   06/25/20 1900  Ampicillin-Sulbactam (UNASYN) 3 g in sodium chloride 0.9 % 100 mL IVPB  Status:  Discontinued        3  g 200 mL/hr over 30 Minutes Intravenous Every 12 hours 06/25/20 1857 06/26/20 1818   06/25/20 1700  cefTRIAXone (ROCEPHIN) 1 g in sodium chloride 0.9 % 100 mL IVPB        1 g 200 mL/hr over 30 Minutes Intravenous  Once 06/25/20 1649 06/25/20 1859   06/25/20 1700  azithromycin (ZITHROMAX) 500 mg in sodium chloride 0.9 % 250 mL IVPB        500 mg 250 mL/hr over 60 Minutes Intravenous  Once 06/25/20 1649 06/25/20 2005      Subjective: No new complaints  Objective: Vitals:   07/14/20 2031 07/14/20 2259 07/15/20 0500 07/15/20 1205  BP: (!) 135/49     Pulse:  88    Resp: (!) 22 (!) 24    Temp: 98.8 F (37.1 C)   (!) 97.4 F (36.3 C)  TempSrc: Axillary   Oral  SpO2:  98%    Weight:   (!) 218.2 kg   Height:        Intake/Output Summary (Last 24 hours)  at 07/15/2020 1724 Last data filed at 07/15/2020 1207 Gross per 24 hour  Intake --  Output 1350 ml  Net -1350 ml   Filed Weights   07/13/20 0500 07/14/20 0500 07/15/20 0500  Weight: (!) 226.3 kg (!) 217.7 kg (!) 218.2 kg    Examination:  General: No acute distress. Cardiovascular: Heart sounds show Tyja Gortney regular rate, and rhythm. Lungs: Clear to auscultation bilaterally  Abdomen: Soft, nontender, nondistended Neurological: Alert and oriented 3. Moves all extremities 4. Cranial nerves II through XII grossly intact. Skin: Warm and dry. No rashes or lesions. Extremities: No clubbing or cyanosis.trace edema.  Data Reviewed: I have personally reviewed following labs and imaging studies  CBC: Recent Labs  Lab 07/11/20 0350 07/12/20 0440 07/13/20 0432 07/14/20 0415 07/15/20 0404  WBC 6.2 6.0 5.7 5.3 5.4  NEUTROABS 4.4 4.1 3.9 3.6 3.6  HGB 9.8* 9.7* 10.0* 10.3* 10.2*  HCT 35.5* 34.7* 36.7 37.3 37.0  MCV 87.7 85.7 85.9 85.9 85.8  PLT 295 266 277 287 287    Basic Metabolic Panel: Recent Labs  Lab 07/11/20 0350 07/12/20 0440 07/12/20 1552 07/13/20 0432 07/14/20 0415 07/15/20 0404  NA 141 140 140 140 140 139  K 3.9 2.9* 3.6 3.4* 3.5 3.9  CL 102 99 98 102 103 102  CO2 35* 33* 38* 31 32 29  GLUCOSE 99 97 108* 99 101* 98  BUN 8 8 10 9 11 11   CREATININE 0.87 0.74 0.94 0.79 0.87 0.82  CALCIUM 7.8* 8.2* 8.5* 8.2* 8.4* 8.5*  MG 2.0 2.2  --  1.9 2.0 2.2  PHOS 4.0 3.9  --  3.7 4.1 4.1    GFR: Estimated Creatinine Clearance: 157.5 mL/min (by C-G formula based on SCr of 0.82 mg/dL).  Liver Function Tests: Recent Labs  Lab 07/11/20 0350 07/12/20 0440 07/13/20 0432 07/14/20 0415 07/15/20 0404  AST 46* 28 29 28  32  ALT 28 27 27 26 21   ALKPHOS 51 51 55 57 56  BILITOT 1.1 0.7 0.7 0.7 0.6  PROT 6.1* 6.1* 6.2* 6.4* 6.5  ALBUMIN 2.4* 2.5* 2.6* 2.7* 2.7*    CBG: Recent Labs  Lab 07/14/20 1318 07/14/20 1646 07/15/20 0743 07/15/20 1144 07/15/20 1634  GLUCAP 118* 117*  96 106* 105*     No results found for this or any previous visit (from the past 240 hour(s)).       Radiology Studies: No results found.  Scheduled Meds: . (feeding supplement) PROSource Plus  30 mL Oral TID BM  . acetaZOLAMIDE  250 mg Oral BID  . magnesium oxide  800 mg Oral BID  . mouth rinse  15 mL Mouth Rinse BID  . multivitamin with minerals  1 tablet Oral Daily  . pantoprazole  40 mg Oral QHS  . potassium chloride  40 mEq Oral Daily  . Ensure Max Protein  11 oz Oral BID  . Rivaroxaban  15 mg Oral BID WC   Followed by  . [START ON 07/22/2020] rivaroxaban  20 mg Oral Q supper  . torsemide  40 mg Oral BID   Continuous Infusions:   LOS: 20 days    Time spent: over 30 min    Lacretia Nicks, MD [facility]   To contact the attending provider between 7A-7P or the covering provider during after hours 7P-7A, please log into the web site [email or web address] and access using universal [place] password for that web site. If you do not have the password, please call the hospital operator.  07/15/2020, 5:24 PM

## 2020-07-15 NOTE — Progress Notes (Signed)
Physical Therapy Treatment Patient Details Name: Felicia Warren MRN: [id number] DOB: 1970-12-27 Today's Date: 07/15/2020    History of Present Illness Patient is a 50 year old female admitted with hypoglycemia and AMS, needing intubation on 5/4 and extubated on 5/8. Unsure of past medical history, patient currently living in [place] plans to move to [place] to be closer to family.    PT Comments    Pt ambulated 28' without an assistive device, mild unsteadiness (no overt loss of balance) but pt refused use of RW. Pt performed seated BUE/LE strengthening exercises.   Follow Up Recommendations  SNF;Supervision for mobility/OOB (pt vague about DC plan ("I'm working on that."), may need ST-SNF if no family available to assist upon DC)     Administrator, arts walker with 5" wheels    Recommendations for Other Services       Precautions / Restrictions Precautions Precautions: Fall Precaution Comments: bariatric Restrictions Weight Bearing Restrictions: No    Mobility  Bed Mobility               General bed mobility comments: up in recliner    Transfers Overall transfer level: Needs assistance Equipment used: None Transfers: Sit to/from Stand Sit to Stand: Supervision         General transfer comment: pt declined the walker "I don't need that" and was able to self rise with increased time and forward weight shft.  Ambulation/Gait Ambulation/Gait assistance: Supervision Gait Distance (Feet): 28 Feet Assistive device: None Gait Pattern/deviations: Step-to pattern;Decreased step length - right;Decreased step length - left;Wide base of support Gait velocity: decreased   General Gait Details: pt declined RW, SaO2 90% on room air walking, no dyspnea, mild unsteadiness but no overt loss of balance   Stairs             Wheelchair Mobility    Modified Rankin (Stroke Patients Only)       Balance Overall balance assessment: Needs  assistance Sitting-balance support: Feet supported Sitting balance-Leahy Scale: Good       Standing balance-Leahy Scale: Good Standing balance comment: mild unsteadiness while walking, declined RW, no loss of balance                            Cognition Arousal/Alertness: Awake/alert Behavior During Therapy: WFL for tasks assessed/performed Overall Cognitive Status: Within Functional Limits for tasks assessed; flat affect                               Problem Solving: Slow processing;Requires verbal cues General Comments: AxO x 3 and pleasant but not willing to share her D/C plans. Pt is in the process of moving here from [place].      Exercises General Exercises - Lower Extremity Ankle Circles/Pumps: AROM;Both;10 reps;Supine Long Arc Quad: AROM;Both;10 reps;Seated Hip Flexion/Marching: AROM;Both;10 reps;Seated    General Comments        Pertinent Vitals/Pain Pain Assessment: No/denies pain    Home Living                      Prior Function            PT Goals (current goals can now be found in the care plan section) Acute Rehab PT Goals Patient Stated Goal: get back to usual routine (would not elaborate) PT Goal Formulation: With patient Time For Goal Achievement: 07/29/20 Potential to Achieve Goals:  Fair Progress towards PT goals: Progressing toward goals    Frequency    Min 3X/week      PT Plan Current plan remains appropriate    Co-evaluation              AM-PAC PT "6 Clicks" Mobility   Outcome Measure  Help needed turning from your back to your side while in a flat bed without using bedrails?: A Little Help needed moving from lying on your back to sitting on the side of a flat bed without using bedrails?: A Little Help needed moving to and from a bed to a chair (including a wheelchair)?: A Little Help needed standing up from a chair using your arms (e.g., wheelchair or bedside chair)?: None Help needed to  walk in hospital room?: A Little Help needed climbing 3-5 steps with a railing? : A Little 6 Click Score: 19    End of Session Equipment Utilized During Treatment: Gait belt Activity Tolerance: Patient tolerated treatment well Patient left: in chair;with call bell/phone within reach;with nursing/sitter in room Nurse Communication: Mobility status PT Visit Diagnosis: Muscle weakness (generalized) (M62.81);Difficulty in walking, not elsewhere classified (R26.2)     Time: 6553-7482 PT Time Calculation (min) (ACUTE ONLY): 20 min  Charges:  $Gait Training: 8-22 mins                    Ralene Bathe Kistler PT 07/15/2020  Acute Rehabilitation Services Pager [phone] Office [phone]

## 2020-07-15 NOTE — Progress Notes (Signed)
Occupational Therapy Treatment Patient Details Name: Felicia Warren MRN: [id number] DOB: Mar 17, 1970 Today's Date: 07/15/2020    History of present illness Patient is a 50 year old female admitted with hypoglycemia and AMS, needing intubation on 5/4 and extubated on 5/8. Unsure of past medical history, patient currently living in [place] plans to move to [place] to be closer to family.   OT comments  Patient progressing and showed improved bed mobility with supervision and sit to stand from EOB without external assistance as well as improved ability to perform LE dressing with moderate while modified long sitting compared to previous session when pt required total assistance. Patient remains limited by confusion very slow mobility without apparent attention to cues or prompts for safety, along with deficits noted below. Pt continues to demonstrate good rehab potential and could benefit from continued skilled OT to increase safety and independence with ADLs and functional transfers to allow pt to return home safely and reduce caregiver burden and fall risk.   Follow Up Recommendations  Home health OT;Supervision - Intermittent (If plan of still family's home.)    Equipment Recommendations  3 in 1 bedside commode    Recommendations for Other Services      Precautions / Restrictions Precautions Precautions: Fall Precaution Comments: bariatric Restrictions Weight Bearing Restrictions: No       Mobility Bed Mobility Overal bed mobility: Needs Assistance     Sidelying to sit: Supervision;HOB elevated (Very slow transition.)       General bed mobility comments: up in recliner    Transfers Overall transfer level: Needs assistance Equipment used: None Transfers: Sit to/from [employer] Sit to Stand: Supervision Stand pivot transfers: Supervision       General transfer comment: Pt took ~ 5 steps in room to recliner without AD. No losses of balance, appearing  steady. Pt rises from EOB quickly with use of anterior weight shirt for momentum.    Balance Overall balance assessment: Needs assistance Sitting-balance support: Feet unsupported Sitting balance-Leahy Scale: Good Sitting balance - Comments: Sat EOB at least 20 mins during session.   Standing balance support: No upper extremity supported Standing balance-Leahy Scale: Good Standing balance comment: mild unsteadiness while walking, declined RW, no loss of balance                           ADL either performed or assessed with clinical judgement   ADL Overall ADL's : Needs assistance/impaired     Grooming: Set up;Sitting Grooming Details (indicate cue type and reason): At EOB, 1/2 long sitting Upper Body Bathing: Sitting;Minimal assistance Upper Body Bathing Details (indicate cue type and reason): EOB     Upper Body Dressing : Min guard;Sitting   Lower Body Dressing: Moderate assistance;Sitting/lateral leans Lower Body Dressing Details (indicate cue type and reason): Pt in a half long sitting position in bed. Pt able to doff and don sock to RT foot which was supported on bed. Total Assist to don sock to foot in dependent position near floor.   Toilet Transfer Details (indicate cue type and reason): Pt declined need for BSC and t/f to recliner.   Toileting - Clothing Manipulation Details (indicate cue type and reason): Pt self-removed her pure wick, but declined need for BSC.       General ADL Comments: Pt moves very slowly and of her own volition. Pt often ignores prompts and therapy was mostly pt-led due to necessity.  Pt required 1 unti O2 to  keeop sat at or over 90%     Vision       Perception     Praxis      Cognition Arousal/Alertness: Awake/alert Behavior During Therapy: Flat affect (Very slow. Making statements that are confused such as insisting taking a bath this morning, where CNA mentioned that a bath was offered to pt and it was refused.) Overall  Cognitive Status: Within Functional Limits for tasks assessed Area of Impairment: Awareness;Problem solving                   Current Attention Level: Selective Memory: Decreased short-term memory Following Commands: Follows one step commands with increased time Safety/Judgement: Decreased awareness of deficits;Decreased awareness of safety Awareness: Emergent Problem Solving: Slow processing;Requires verbal cues General Comments: Pt remains guarded when speaking of discharge plans.        Exercises General Exercises - Lower Extremity Ankle Circles/Pumps: AROM;Both;10 reps;Supine Long Arc Quad: AROM;Both;10 reps;Seated Hip Flexion/Marching: AROM;Both;10 reps;Seated   Shoulder Instructions       General Comments      Pertinent Vitals/ Pain       Pain Assessment: No/denies pain Faces Pain Scale: No hurt  Home Living                                          Prior Functioning/Environment              Frequency  Min 2X/week        Progress Toward Goals  OT Goals(current goals can now be found in the care plan section)  Progress towards OT goals: Progressing toward goals;Goals met and updated - see care plan (Some goals met, others added and modified to reflect pt's progress.)  Acute Rehab OT Goals Patient Stated Goal: get back to usual routine (would not elaborate) Time For Goal Achievement: 07/29/20 Potential to Achieve Goals: Good ADL Goals Pt Will Perform Grooming: with modified independence;standing Pt Will Perform Lower Body Dressing: with modified independence;with adaptive equipment;sit to/from stand Pt Will Transfer to Toilet: with modified independence;ambulating;bedside commode (bariatric BSC) Pt/caregiver will Perform Home Exercise Program: Increased strength;Both right and left upper extremity;With Supervision Additional ADL Goal #1: Pt will demonstrate improved mentation by scoring <8 on Short Blessed Test of cognition.   Plan      Co-evaluation                 AM-PAC OT "6 Clicks" Daily Activity     Outcome Measure   Help from another person eating meals?: None Help from another person taking care of personal grooming?: A Little Help from another person toileting, which includes using toliet, bedpan, or urinal?: A Lot Help from another person bathing (including washing, rinsing, drying)?: A Lot Help from another person to put on and taking off regular upper body clothing?: A Little Help from another person to put on and taking off regular lower body clothing?: A Lot 6 Click Score: 16    End of Session Equipment Utilized During Treatment: Oxygen  OT Visit Diagnosis: Other abnormalities of gait and mobility (R26.89);Muscle weakness (generalized) (M62.81);Other symptoms and signs involving cognitive function   Activity Tolerance Patient tolerated treatment well;Patient limited by fatigue   Patient Left in chair;with call bell/phone within reach;with chair alarm set   Nurse Communication  (Nursing in room for much of OT session.)        Time: [phone]  OT Time Calculation (min): 30 min  Charges: OT General Charges $OT Visit: 1 Visit OT Treatments $Self Care/Home Management : 8-22 mins $Therapeutic Activity: 8-22 mins  Anderson Malta, OT Acute Rehab Services Office: [phone] 07/15/2020   Julien Girt 07/15/2020, 1:43 PM

## 2020-07-16 DIAGNOSIS — E876 Hypokalemia: Secondary | ICD-10-CM

## 2020-07-16 DIAGNOSIS — E162 Hypoglycemia, unspecified: Secondary | ICD-10-CM

## 2020-07-16 DIAGNOSIS — R7401 Elevation of levels of liver transaminase levels: Secondary | ICD-10-CM

## 2020-07-16 DIAGNOSIS — G9341 Metabolic encephalopathy: Secondary | ICD-10-CM

## 2020-07-16 LAB — CBC WITH DIFFERENTIAL/PLATELET
Abs Immature Granulocytes: 0.02 10*3/uL (ref 0.00–0.07)
Basophils Absolute: 0 10*3/uL (ref 0.0–0.1)
Basophils Relative: 0 %
Eosinophils Absolute: 0.1 10*3/uL (ref 0.0–0.5)
Eosinophils Relative: 3 %
HCT: 37.2 % (ref 36.0–46.0)
Hemoglobin: 10.3 g/dL — ABNORMAL LOW (ref 12.0–15.0)
Immature Granulocytes: 0 %
Lymphocytes Relative: 20 %
Lymphs Abs: 1 10*3/uL (ref 0.7–4.0)
MCH: 23.6 pg — ABNORMAL LOW (ref 26.0–34.0)
MCHC: 27.7 g/dL — ABNORMAL LOW (ref 30.0–36.0)
MCV: 85.3 fL (ref 80.0–100.0)
Monocytes Absolute: 0.4 10*3/uL (ref 0.1–1.0)
Monocytes Relative: 8 %
Neutro Abs: 3.5 10*3/uL (ref 1.7–7.7)
Neutrophils Relative %: 69 %
Platelets: 277 10*3/uL (ref 150–400)
RBC: 4.36 MIL/uL (ref 3.87–5.11)
RDW: 18.9 % — ABNORMAL HIGH (ref 11.5–15.5)
WBC: 5.1 10*3/uL (ref 4.0–10.5)
nRBC: 0 % (ref 0.0–0.2)

## 2020-07-16 LAB — COMPREHENSIVE METABOLIC PANEL
ALT: 24 U/L (ref 0–44)
AST: 27 U/L (ref 15–41)
Albumin: 2.9 g/dL — ABNORMAL LOW (ref 3.5–5.0)
Alkaline Phosphatase: 55 U/L (ref 38–126)
Anion gap: 9 (ref 5–15)
BUN: 12 mg/dL (ref 6–20)
CO2: 31 mmol/L (ref 22–32)
Calcium: 8.5 mg/dL — ABNORMAL LOW (ref 8.9–10.3)
Chloride: 99 mmol/L (ref 98–111)
Creatinine, Ser: 0.84 mg/dL (ref 0.44–1.00)
GFR, Estimated: >60 mL/min (ref >60)
Glucose, Bld: 95 mg/dL (ref 70–99)
Potassium: 3.1 mmol/L — ABNORMAL LOW (ref 3.5–5.1)
Sodium: 139 mmol/L (ref 135–145)
Total Bilirubin: 0.6 mg/dL (ref 0.3–1.2)
Total Protein: 6.5 g/dL (ref 6.5–8.1)

## 2020-07-16 LAB — GLUCOSE, CAPILLARY
Glucose-Capillary: 88 mg/dL (ref 70–99)
Glucose-Capillary: 112 mg/dL — ABNORMAL HIGH (ref 70–99)
Glucose-Capillary: 100 mg/dL — ABNORMAL HIGH (ref 70–99)

## 2020-07-16 LAB — MAGNESIUM: Magnesium: 1.9 mg/dL (ref 1.7–2.4)

## 2020-07-16 LAB — PHOSPHORUS: Phosphorus: 4.5 mg/dL (ref 2.5–4.6)

## 2020-07-16 MED ORDER — POTASSIUM CHLORIDE CRYS ER 20 MEQ PO TBCR: 40.0000 meq | EXTENDED_RELEASE_TABLET | Freq: Every day | ORAL | Status: DC

## 2020-07-16 MED ORDER — POTASSIUM CHLORIDE CRYS ER 20 MEQ PO TBCR
40.0000 meq | EXTENDED_RELEASE_TABLET | ORAL | Status: AC
  Administered 2020-07-16 (×2): 40 meq via ORAL
  Filled 2020-07-16 (×2): qty 2

## 2020-07-16 NOTE — TOC Progression Note (Addendum)
Transition of Care [facility]) - Progression Note    Patient Details  Name: Felicia Warren MRN: [id number] Date of Birth: [birth year]  Transition of Care [facility]) CM/SW Contact  Darleene Cleaver, [place] Phone Number: 07/16/2020, 7:36 PM  Clinical Narrative:     CSW was informed that patient can go to a SNF for a two week LOG, however, CSW needs to find a facility that will take patient.  CSW to restart bed search again, patient able to ambulate 28 feet today with therapy.   Expected Discharge Plan: Homeless Shelter Barriers to Discharge: Continued Medical Work up  Expected Discharge Plan and Services Expected Discharge Plan: Homeless Shelter       Living arrangements for the past 2 months: Hotel/Motel                                       Social Determinants of Health (SDOH) Interventions    Readmission Risk Interventions No flowsheet data found.

## 2020-07-16 NOTE — Progress Notes (Signed)
PROGRESS NOTE    Felicia Warren  ZOX:[id number] DOB: 09/28/1970 DOA: 06/25/2020 PCP: Pcp, No    Chief Complaint  Patient presents with  . unresponsive    Brief Narrative:  50 yo F who presented initially as a [place] Doe, found with altered mental status in a motel by staff. It is unknown when her last known well was. EMS found patient awake, but altered with aglucoseof40. This was corrected, but she continued to have worsening mental status. Narcan did not provide any response. In ED she required nonrebreather mask and eventual intubation for hypoxic respiratory failure. Chest x-ray with probable multifocal pneumonia. AKI, elevated INR on admit. [place].Admitted by PCCM given requirement for airway protection/intubation.   5/4 presented to ED with hypoglycemia and AMS, required intubation, PCCM consult. CXR concerning for PNA. Unasyn initiated. [facility] UDS neg, neg tylenol/ salicylates. CTH neg. Lactic acid 5.9->4.9  5/5 Hypotensive, responded to IVF + albumin, on heparin gtt but stopped due to platelet decline, not on pressors, requiring PEEP 10 / FiO2 80. RUQ US with cholelithiasis but no cholecystitis.   5/6 Vent needs decreased. LE venous duplex negative.   5/7 diarrhea > Placed flexiseal 5/8  5/8 WUA/SBT, extubated  She's currently being diuresed.  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  No insurance.  SW looking into adult medicaid   Assessment & Plan:   Principal Problem:   Acute respiratory failure with hypoxia and hypercarbia (HCC) Active Problems:   Hypercapnic respiratory failure (HCC)   Elevated LFTs   Acute metabolic encephalopathy   Pulmonary emboli (HCC)   Transaminitis   Hypokalemia   Hypoglycemia  #1 acute on chronic hypoxic hypercarbic respiratory failure/probable OSA/OHS -Patient noted to have a rapid response on 5/12  And 513 due  to increased work of breathing and desats. -Respiratory status improving on BiPAP as patient tolerated more BiPAP bolus BiPAP nightly. -Goal sats 88 to 92% -Patient was on Lasix and has been transitioned to oral torsemide with good urine output of 2.6 L over the past 24 hours. -Patient is -33.915 L. -Metabolic alkalosis improved.  Diamox has been discontinued. -It is noted that his single BiPAP also remain subpar at times. -We will need outpatient sleep study.  2.  Acute metabolic encephalopathy -Felt secondary to chronic CO2 retention. -VBG was noted to have compensated respiratory acidosis. -Likely needs outpatient sleep study as patient would likely probable OHS and OSA. -Patient noted to be at risk for needing a trach if ongoing need for BiPAP intermittently as well and ability to lose some weight.  3.  Pulmonary embolism -CT angiogram chest from 06/30/2020 with an area suspicious for segmental thrombus in the left lower lobe associated airspace disease and probably likely pulmonary infarct. -Images concerning for right heart strain. -2D echo done was concerning for McConnell sign. -Lower extremity Dopplers negative for DVT. -Currently transition to Xarelto.  4.  AKI with unknown baseline resolved/AG MA/lactic acidosis resolved/contraction alkalosis -Renal function improved. -Diamox discontinued. -Avoid nephrotoxic agents.  5.  Hypokalemia -Likely secondary to diuretics. -K-Dur 40 mEq p.o. every 4 hours x2 doses. -Resume daily oral KDur 40 mEq tomorrow.  6.  Demand ischemia -Troponin noted to be downtrending with oxygenation. -2D echo with EF of 55 to 60%, NWMA, McConnell sign with moderately enlarged RV intraventricular septal flattening concerning for RV overload (likely secondary to OHS). -Suspected left lower lobe segmental PE as  above. -Continue Xarelto. -Continue Demadex. -Seen by cardiology and patient may benefit from outpatient right heart cath.  7.   Transaminitis -?  Etiology. -Concern for possible shock liver. -Acute hepatitis panel negative. -Right upper quadrant ultrasound with cholelithiasis and negative for cholecystitis. -LFTs have trended down and transaminitis resolved.  8.  Hypoglycemia -Patient noted on admission to have glucose of 40s. -Resolved with dextrose infusion.  9.  Diarrhea, resolving -Likely in the setting of tube feeds and bowel regimen.  10.  Normocytic anemia/anemia of chronic disease -H&H stable. -Threshold hemoglobin < 7.  11.  Social barriers -patient from [place] and uninsured; her wish is to return to [place] to [place] belongings and move to [place] as she has family here, however she has nowhere here to live yet if she moved - not sure if family would take her in after this hospitalization but this has been brought up to them and it didn't appear to be an option - if she's needing nightly BIPAP, that will be a factor; and/or if she needs continuous O2 that will also be a factor - mobility extremely limited; PT following; staff needs to try and get her OOB to chair with meals as able - until these issues are fixed and/or addressed, she will remain in the hospital a prolonged amount of time  12.  Morbid obesity     DVT prophylaxis: Xarelto Code Status: Full Family Communication: Updated patient.  No family at bedside. Disposition:   Status is: Inpatient    Dispo: The patient is from: Home              Anticipated d/c is to: TBD              Patient currently on BiPAP.  Not medically stable for discharge.   Difficult to place patient yes       Consultants:   Infectious disease: Dr., 06/30/2020  Cardiology: Dr. Mayford Knife 06/27/2020  Pulmonary/PCCM admission  Procedures:   CT angiogram chest 06/30/2020  CT head 06/25/2020  Abdominal ultrasound 06/26/2020  Renal ultrasound 06/26/2020  2D echo 07/11/2020  Lower extremity Dopplers 06/27/2020  Antimicrobials:  Anti-infectives (From  admission, onward)   Start     Dose/Rate Route Frequency Ordered Stop   06/26/20 2000  cefTRIAXone (ROCEPHIN) 2 g in sodium chloride 0.9 % 100 mL IVPB  Status:  Discontinued        2 g 200 mL/hr over 30 Minutes Intravenous Every 24 hours 06/26/20 1818 07/01/20 1552   06/25/20 1900  Ampicillin-Sulbactam (UNASYN) 3 g in sodium chloride 0.9 % 100 mL IVPB  Status:  Discontinued        3 g 200 mL/hr over 30 Minutes Intravenous Every 12 hours 06/25/20 1857 06/26/20 1818   06/25/20 1700  cefTRIAXone (ROCEPHIN) 1 g in sodium chloride 0.9 % 100 mL IVPB        1 g 200 mL/hr over 30 Minutes Intravenous  Once 06/25/20 1649 06/25/20 1859   06/25/20 1700  azithromycin (ZITHROMAX) 500 mg in sodium chloride 0.9 % 250 mL IVPB        500 mg 250 mL/hr over 60 Minutes Intravenous  Once 06/25/20 1649 06/25/20 2005       Subjective: Lying in bed on nasal cannula.  Denies chest pain.  No shortness of breath.  No abdominal pain.  Overall feeling better.  Objective: Vitals:   07/16/20 0310 07/16/20 0500 07/16/20 0642 07/16/20 1215  BP:   (!) 114/50 134/60  Pulse: 76  72  Resp: 15   (!) 22  Temp:   98.1 F (36.7 C) 98 F (36.7 C)  TempSrc:   Oral Oral  SpO2: 94%   93%  Weight:  (!) 218.3 kg    Height:        Intake/Output Summary (Last 24 hours) at 07/16/2020 1956 Last data filed at 07/16/2020 1700 Gross per 24 hour  Intake 480 ml  Output 5200 ml  Net -4720 ml   Filed Weights   07/14/20 0500 07/15/20 0500 07/16/20 0500  Weight: (!) 217.7 kg (!) 218.2 kg (!) 218.3 kg    Examination:  General exam: Appears calm and comfortable  Respiratory system: Clear to auscultation. Respiratory effort normal. Cardiovascular system: S1 & S2 heard, RRR. No JVD, murmurs, rubs, gallops or clicks. No pedal edema. Gastrointestinal system: Abdomen is nondistended, soft and nontender. No organomegaly or masses felt. Normal bowel sounds heard. Central nervous system: Alert and oriented. No focal neurological  deficits. Extremities: Symmetric 5 x 5 power. Skin: No rashes, lesions or ulcers Psychiatry: Judgement and insight appear normal. Mood & affect appropriate.     Data Reviewed: I have personally reviewed following labs and imaging studies  CBC: Recent Labs  Lab 07/12/20 0440 07/13/20 0432 07/14/20 0415 07/15/20 0404 07/16/20 0406  WBC 6.0 5.7 5.3 5.4 5.1  NEUTROABS 4.1 3.9 3.6 3.6 3.5  HGB 9.7* 10.0* 10.3* 10.2* 10.3*  HCT 34.7* 36.7 37.3 37.0 37.2  MCV 85.7 85.9 85.9 85.8 85.3  PLT 266 277 287 287 277    Basic Metabolic Panel: Recent Labs  Lab 07/12/20 0440 07/12/20 1552 07/13/20 0432 07/14/20 0415 07/15/20 0404 07/16/20 0406  NA 140 140 140 140 139 139  K 2.9* 3.6 3.4* 3.5 3.9 3.1*  CL 99 98 102 103 102 99  CO2 33* 38* 31 32 29 31  GLUCOSE 97 108* 99 101* 98 95  BUN 8 10 9 11 11 12   CREATININE 0.74 0.94 0.79 0.87 0.82 0.84  CALCIUM 8.2* 8.5* 8.2* 8.4* 8.5* 8.5*  MG 2.2  --  1.9 2.0 2.2 1.9  PHOS 3.9  --  3.7 4.1 4.1 4.5    GFR: Estimated Creatinine Clearance: 153.7 mL/min (by C-G formula based on SCr of 0.84 mg/dL).  Liver Function Tests: Recent Labs  Lab 07/12/20 0440 07/13/20 0432 07/14/20 0415 07/15/20 0404 07/16/20 0406  AST 28 29 28  32 27  ALT 27 27 26 21 24   ALKPHOS 51 55 57 56 55  BILITOT 0.7 0.7 0.7 0.6 0.6  PROT 6.1* 6.2* 6.4* 6.5 6.5  ALBUMIN 2.5* 2.6* 2.7* 2.7* 2.9*    CBG: Recent Labs  Lab 07/15/20 1144 07/15/20 1634 07/16/20 0743 07/16/20 1236 07/16/20 1731  GLUCAP 106* 105* 88 112* 100*     No results found for this or any previous visit (from the past 240 hour(s)).       Radiology Studies: No results found.      Scheduled Meds: . (feeding supplement) PROSource Plus  30 mL Oral TID BM  . magnesium oxide  800 mg Oral BID  . mouth rinse  15 mL Mouth Rinse BID  . multivitamin with minerals  1 tablet Oral Daily  . pantoprazole  40 mg Oral QHS  . [START ON 07/17/2020] potassium chloride  40 mEq Oral Daily  .  Ensure Max Protein  11 oz Oral BID  . Rivaroxaban  15 mg Oral BID WC   Followed by  . [START ON 07/22/2020] rivaroxaban  20 mg  Oral Q supper  . torsemide  40 mg Oral BID   Continuous Infusions:   LOS: 21 days    Time spent: 35 minutes    Ramiro Harvest, MD [facility]   To contact the attending provider between 7A-7P or the covering provider during after hours 7P-7A, please log into the web site [email or web address] and access using universal [place] password for that web site. If you do not have the password, please call the hospital operator.  07/16/2020, 7:56 PM

## 2020-07-17 LAB — BASIC METABOLIC PANEL WITH GFR
Anion gap: 9 (ref 5–15)
BUN: 12 mg/dL (ref 6–20)
CO2: 35 mmol/L — ABNORMAL HIGH (ref 22–32)
Calcium: 8.7 mg/dL — ABNORMAL LOW (ref 8.9–10.3)
Chloride: 96 mmol/L — ABNORMAL LOW (ref 98–111)
Creatinine, Ser: 0.76 mg/dL (ref 0.44–1.00)
GFR, Estimated: >60 mL/min
Glucose, Bld: 96 mg/dL (ref 70–99)
Potassium: 3.3 mmol/L — ABNORMAL LOW (ref 3.5–5.1)
Sodium: 140 mmol/L (ref 135–145)

## 2020-07-17 LAB — CBC
HCT: 37.8 % (ref 36.0–46.0)
Hemoglobin: 10.9 g/dL — ABNORMAL LOW (ref 12.0–15.0)
MCH: 24.1 pg — ABNORMAL LOW (ref 26.0–34.0)
MCHC: 28.8 g/dL — ABNORMAL LOW (ref 30.0–36.0)
MCV: 83.6 fL (ref 80.0–100.0)
Platelets: 295 10*3/uL (ref 150–400)
RBC: 4.52 MIL/uL (ref 3.87–5.11)
RDW: 19.2 % — ABNORMAL HIGH (ref 11.5–15.5)
WBC: 5 10*3/uL (ref 4.0–10.5)
nRBC: 0 % (ref 0.0–0.2)

## 2020-07-17 LAB — GLUCOSE, CAPILLARY
Glucose-Capillary: 88 mg/dL (ref 70–99)
Glucose-Capillary: 133 mg/dL — ABNORMAL HIGH (ref 70–99)
Glucose-Capillary: 144 mg/dL — ABNORMAL HIGH (ref 70–99)

## 2020-07-17 LAB — MAGNESIUM: Magnesium: 1.9 mg/dL (ref 1.7–2.4)

## 2020-07-17 MED ORDER — POTASSIUM CHLORIDE CRYS ER 20 MEQ PO TBCR
40.0000 meq | EXTENDED_RELEASE_TABLET | Freq: Two times a day (BID) | ORAL | Status: DC
  Administered 2020-07-17 – 2020-07-19 (×5): 40 meq via ORAL
  Filled 2020-07-17 (×5): qty 2

## 2020-07-17 NOTE — Progress Notes (Signed)
PROGRESS NOTE    Felicia Warren  CNO:[id number] DOB: [birth year] DOA: 06/25/2020 PCP: Pcp, No    Chief Complaint  Patient presents with  . unresponsive    Brief Narrative:  50 yo F who presented initially as a [name], found with altered mental status in a motel by staff. It is unknown when her last known well was. EMS found patient awake, but altered with aglucoseof40. This was corrected, but she continued to have worsening mental status. Narcan did not provide any response. In ED she required nonrebreather mask and eventual intubation for hypoxic respiratory failure. Chest x-ray with probable multifocal pneumonia. AKI, elevated INR on admit. No prior history in epicas she is from [facility] no known emergency contact.Admitted by PCCM given requirement for airway protection/intubation.   5/4 presented to ED with hypoglycemia and AMS, required intubation, PCCM consult. CXR concerning for PNA. Unasyn initiated. Tx to [facility] for beds. UDS neg, neg tylenol/ salicylates. CTH neg. Lactic acid 5.9->4.9  5/5 Hypotensive, responded to IVF + albumin, on heparin gtt but stopped due to platelet decline, not on pressors, requiring PEEP 10 / FiO2 80. RUQ [place] with cholelithiasis but no cholecystitis.   5/6 Vent needs decreased. LE venous duplex negative.   5/7 diarrhea > Placed flexiseal 5/8  5/8 WUA/SBT, extubated  She's currently being diuresed.  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  No insurance.  SW looking into adult medicaid   Assessment & Plan:   Principal Problem:   Acute respiratory failure with hypoxia and hypercarbia (HCC) Active Problems:   Hypercapnic respiratory failure (HCC)   Elevated LFTs   Acute metabolic encephalopathy   Pulmonary emboli (HCC)   Transaminitis   Hypokalemia   Hypoglycemia  1 acute on chronic hypoxic hypercarbic respiratory failure/probable OSA/OHS -Patient noted to have a rapid response on 5/12  And 5/13 due  to increased work of breathing and desats. -Respiratory status improving on BiPAP as patient tolerated more BiPAP bolus BiPAP nightly. -Goal sats 88 to 92% -Patient was on Lasix and has been transitioned to oral torsemide with good urine output of 5.1 L over the past 24 hours. -Patient is - 36.815 L. -Metabolic alkalosis improved.  Diamox has been discontinued. -It is noted that seal on  BiPAP also remain subpar at times. -Patient with sats of 93% on room air this morning. -We will need outpatient sleep study.  2.  Acute metabolic encephalopathy -Felt secondary to chronic CO2 retention. -VBG was noted to have compensated respiratory acidosis. -Likely needs outpatient sleep study as patient would likely probable OHS and OSA. -Patient noted to be at risk for needing a trach if ongoing need for BiPAP intermittently as well and ability to lose some weight.  3.  Pulmonary embolism -CT angiogram chest from 06/30/2020 with an area suspicious for segmental thrombus in the left lower lobe associated airspace disease and probably likely pulmonary infarct. -Images concerning for right heart strain. -2D echo done was concerning for McConnell sign. -Lower extremity Dopplers negative for DVT. -Continue Xarelto.    4.  AKI with unknown baseline resolved/AG MA/lactic acidosis resolved/contraction alkalosis -Renal function improved. -Diamox discontinued. -Avoid nephrotoxic agents.  5.  Hypokalemia -Secondary to diuresis.   -Increase potassium to 40 mEq twice daily.   -Repeat labs in the morning.  6.  Demand ischemia -Troponin noted to be downtrending with oxygenation. -2D echo with EF of 55 to 60%, NWMA, McConnell sign with moderately enlarged RV intraventricular septal flattening concerning for RV overload (  likely secondary to OHS). -Suspected left lower lobe segmental PE as above. -Continue Xarelto. -Continue Demadex. -Seen by cardiology and patient may benefit from outpatient right heart  cath.  7.  Transaminitis -?  Etiology. -Concern for possible shock liver. -Acute hepatitis panel negative. -Right upper quadrant ultrasound with cholelithiasis and negative for cholecystitis. -LFTs have trended down and transaminitis resolved.  8.  Hypoglycemia -Patient noted on admission to have glucose of 40s. -Resolved with dextrose infusion. -CBG of 88 this morning.  9.  Diarrhea, resolving -Likely in the setting of tube feeds and bowel regimen.  10.  Normocytic anemia/anemia of chronic disease -Hemoglobin stable at 10.9. -Transfusion threshold hemoglobin < 7.  11.  Social barriers -[place] and [place]; [place] to [place] and [place] as she has family here, however she has nowhere here to live yet if she moved - not sure if family would take her in after this hospitalization but this has been brought up to them and it didn't appear to be an option - if she's needing nightly BIPAP, that will be a factor; and/or if she needs continuous O2 that will also be a factor - mobility extremely limited; PT following; staff needs to try and get her OOB to chair with meals as able - until these issues are fixed and/or addressed, she will remain in the hospital a prolonged amount of time  12.  Morbid obesity     DVT prophylaxis: Xarelto Code Status: Full Family Communication: Updated patient.  No family at bedside. Disposition:   Status is: Inpatient    Dispo: The patient is from: Home              Anticipated d/c is to: TBD              Patient currently on BiPAP.  Not medically stable for discharge.   Difficult to place patient yes       Consultants:   Infectious disease: Dr., 06/30/2020  Cardiology: Dr. Mayford Knifeurner 06/27/2020  Pulmonary/PCCM admission  Procedures:   CT angiogram chest 06/30/2020  CT head 06/25/2020  Abdominal ultrasound 06/26/2020  Renal ultrasound 06/26/2020  2D echo 07/11/2020  Lower extremity  Dopplers 06/27/2020  Antimicrobials:  Anti-infectives (From admission, onward)   Start     Dose/Rate Route Frequency Ordered Stop   06/26/20 2000  cefTRIAXone (ROCEPHIN) 2 g in sodium chloride 0.9 % 100 mL IVPB  Status:  Discontinued        2 g 200 mL/hr over 30 Minutes Intravenous Every 24 hours 06/26/20 1818 07/01/20 1552   06/25/20 1900  Ampicillin-Sulbactam (UNASYN) 3 g in sodium chloride 0.9 % 100 mL IVPB  Status:  Discontinued        3 g 200 mL/hr over 30 Minutes Intravenous Every 12 hours 06/25/20 1857 06/26/20 1818   06/25/20 1700  cefTRIAXone (ROCEPHIN) 1 g in sodium chloride 0.9 % 100 mL IVPB        1 g 200 mL/hr over 30 Minutes Intravenous  Once 06/25/20 1649 06/25/20 1859   06/25/20 1700  azithromycin (ZITHROMAX) 500 mg in sodium chloride 0.9 % 250 mL IVPB        500 mg 250 mL/hr over 60 Minutes Intravenous  Once 06/25/20 1649 06/25/20 2005       Subjective: Laying on her side in bed.  No chest pain.  No shortness of breath.  No abdominal pain.  Feeling better.  Stated tolerated BiPAP overnight.  Objective: Vitals:   07/16/20 2056 07/17/20 0500 07/17/20 0813 07/17/20 1225  BP:  (!) 113/53  (!) 100/45  Pulse:  71 84   Resp:  (!) 22 20 16   Temp: 98.7 F (37.1 C) 99.2 F (37.3 C)  98.3 F (36.8 C)  TempSrc: Axillary Axillary  Oral  SpO2:  93% 90% 93%  Weight:      Height:        Intake/Output Summary (Last 24 hours) at 07/17/2020 1340 Last data filed at 07/17/2020 1226 Gross per 24 hour  Intake --  Output 5500 ml  Net -5500 ml   Filed Weights   07/14/20 0500 07/15/20 0500 07/16/20 0500  Weight: (!) 217.7 kg (!) 218.2 kg (!) 218.3 kg    Examination:  General exam: NAD Respiratory system: CTA B.  No wheezes, no crackles, no rhonchi.  Normal respiratory effort.  Cardiovascular system: RRR no murmurs rubs or gallops.  No JVD.  No lower extremity edema. Gastrointestinal system: Abdomen is obese, soft, nontender, nondistended, positive bowel sounds.  No  rebound.  No guarding.  Central nervous system: Alert and oriented. No focal neurological deficits. Extremities: Symmetric 5 x 5 power. Skin: No rashes, lesions or ulcers Psychiatry: Judgement and insight appear normal. Mood & affect appropriate.     Data Reviewed: I have personally reviewed following labs and imaging studies  CBC: Recent Labs  Lab 07/12/20 0440 07/13/20 0432 07/14/20 0415 07/15/20 0404 07/16/20 0406 07/17/20 0357  WBC 6.0 5.7 5.3 5.4 5.1 5.0  NEUTROABS 4.1 3.9 3.6 3.6 3.5  --   HGB 9.7* 10.0* 10.3* 10.2* 10.3* 10.9*  HCT 34.7* 36.7 37.3 37.0 37.2 37.8  MCV 85.7 85.9 85.9 85.8 85.3 83.6  PLT 266 277 287 287 277 295    Basic Metabolic Panel: Recent Labs  Lab 07/12/20 0440 07/12/20 1552 07/13/20 0432 07/14/20 0415 07/15/20 0404 07/16/20 0406 07/17/20 0357  NA 140   < > 140 140 139 139 140  K 2.9*   < > 3.4* 3.5 3.9 3.1* 3.3*  CL 99   < > 102 103 102 99 96*  CO2 33*   < > 31 32 29 31 35*  GLUCOSE 97   < > 99 101* 98 95 96  BUN 8   < > 9 11 11 12 12   CREATININE 0.74   < > 0.79 0.87 0.82 0.84 0.76  CALCIUM 8.2*   < > 8.2* 8.4* 8.5* 8.5* 8.7*  MG 2.2  --  1.9 2.0 2.2 1.9 1.9  PHOS 3.9  --  3.7 4.1 4.1 4.5  --    < > = values in this interval not displayed.    GFR: Estimated Creatinine Clearance: 161.4 mL/min (by C-G formula based on SCr of 0.76 mg/dL).  Liver Function Tests: Recent Labs  Lab 07/12/20 0440 07/13/20 0432 07/14/20 0415 07/15/20 0404 07/16/20 0406  AST 28 29 28  32 27  ALT 27 27 26 21 24   ALKPHOS 51 55 57 56 55  BILITOT 0.7 0.7 0.7 0.6 0.6  PROT 6.1* 6.2* 6.4* 6.5 6.5  ALBUMIN 2.5* 2.6* 2.7* 2.7* 2.9*    CBG: Recent Labs  Lab 07/16/20 0743 07/16/20 1236 07/16/20 1731 07/17/20 0726 07/17/20 1141  GLUCAP 88 112* 100* 88 133*     No results found for this or any previous visit (from the past 240 hour(s)).       Radiology Studies: No results found.      Scheduled Meds: . (feeding supplement) PROSource  Plus  30 mL Oral TID BM  . magnesium oxide  800 mg Oral BID  . mouth rinse  15 mL Mouth Rinse BID  . multivitamin with minerals  1 tablet Oral Daily  . pantoprazole  40 mg Oral QHS  . potassium chloride  40 mEq Oral BID  . Ensure Max Protein  11 oz Oral BID  . Rivaroxaban  15 mg Oral BID WC   Followed by  . [START ON 07/22/2020] rivaroxaban  20 mg Oral Q supper  . torsemide  40 mg Oral BID   Continuous Infusions:   LOS: 22 days    Time spent: 35 minutes    Ramiro Harvest, MD [facility]   To contact the attending provider between 7A-7P or the covering provider during after hours 7P-7A, please log into the web site [email or web address] and access using universal [place] password for that web site. If you do not have the password, please call the hospital operator.  07/17/2020, 1:40 PM

## 2020-07-17 NOTE — Progress Notes (Signed)
Physical Therapy Treatment Patient Details Name: Felicia Warren MRN: [id number] DOB: [birth year] Today's Date: 07/17/2020    History of Present Illness Patient is a 50 year old female admitted with hypoglycemia and AMS, needing intubation on 5/4 and extubated on 5/8. Unsure of past medical history, patient currently living in [place] plans to move to [place] to be closer to family.    PT Comments    Patient  Mobilizes self to sitting and standing. Declines use of Rw. Walks very slowly, holds to bed/ray at times. HR 90 at rest, up to 140's with activity. SPO2 to 89% on RA with mobility.  continue  Mobility.  Follow Up Recommendations   SNF     Equipment Recommendations  Rolling walker with 5" wheels    Recommendations for Other Services       Precautions / Restrictions Precautions Precautions: Fall Precaution Comments: bariatric, HR goes high w/ mobility    Mobility  Bed Mobility   Bed Mobility: Rolling;Sidelying to Sit Rolling: Supervision   Supine to sit: Supervision Sit to supine: Supervision   General bed mobility comments: patient positions herself in various ways to egress and  return to bed.    Transfers Overall transfer level: Needs assistance Equipment used: None   Sit to Stand: Supervision         General transfer comment: patient  holds bed, tray table, declines Rw  Ambulation/Gait Ambulation/Gait assistance: Supervision Gait Distance (Feet): 28 Feet Assistive device: None Gait Pattern/deviations: Step-to pattern;Decreased step length - right;Decreased step length - left;Wide base of support Gait velocity: decreased   General Gait Details: pt declined RW, SaO2 89% on room air walking, no dyspnea, mild unsteadiness but no overt loss of balance, HR up to 140's   Stairs             Wheelchair Mobility    Modified Rankin (Stroke Patients Only)       Balance Overall balance assessment: Needs assistance Sitting-balance support: Feet  unsupported Sitting balance-Leahy Scale: Good     Standing balance support: No upper extremity supported Standing balance-Leahy Scale: Good Standing balance comment: mild unsteadiness while walking, declined RW, no loss of balance                            Cognition Arousal/Alertness: Awake/alert Behavior During Therapy: Flat affect Overall Cognitive Status: Within Functional Limits for tasks assessed Area of Impairment: Awareness                   Current Attention Level: Selective   Following Commands: Follows one step commands consistently              Exercises      General Comments        Pertinent Vitals/Pain Faces Pain Scale: No hurt    Home Living                      Prior Function            PT Goals (current goals can now be found in the care plan section) Progress towards PT goals: Progressing toward goals    Frequency    Min 2X/week      PT Plan Current plan remains appropriate;Frequency needs to be updated    Co-evaluation              AM-PAC PT "6 Clicks" Mobility   Outcome Measure  Help needed turning from your  back to your side while in a flat bed without using bedrails?: None Help needed moving from lying on your back to sitting on the side of a flat bed without using bedrails?: None Help needed moving to and from a bed to a chair (including a wheelchair)?: A Little Help needed standing up from a chair using your arms (e.g., wheelchair or bedside chair)?: None Help needed to walk in hospital room?: A Little Help needed climbing 3-5 steps with a railing? : A Lot 6 Click Score: 20    End of Session   Activity Tolerance: Patient tolerated treatment well Patient left: in bed;with call bell/phone within reach;with nursing/sitter in room Nurse Communication: Mobility status PT Visit Diagnosis: Muscle weakness (generalized) (M62.81);Difficulty in walking, not elsewhere classified (R26.2)     Time:  1884-1660 PT Time Calculation (min) (ACUTE ONLY): 20 min  Charges:  $Gait Training: 8-22 mins                     Felicia Warren PT Acute Rehabilitation Services Pager [phone] Office [phone]    Felicia Warren 07/17/2020, 5:18 PM

## 2020-07-17 NOTE — Progress Notes (Signed)
Shift summary:   Uneventful shift. Remained on room air, after RT took patient off BiPAP with no issues. Remained alert and oriented x 4. Worked with therapy during this shift. Remained SR-ST on the monitor in the lower 100's. Assisted with peri care. No other needs identified, will continue to monitor. Awaiting placement.

## 2020-07-17 NOTE — Plan of Care (Signed)

## 2020-07-17 NOTE — Progress Notes (Signed)
Chart reviewed. Patient still in hospital with ongoing social issues - discharge plan pending. Please call us closer to discharge if we can be helpful regarding recommendations for her respiratory failure. For now will see as needed.  Durel Salts, MD Pulmonary and Critical Care Medicine [facility]

## 2020-07-18 LAB — GLUCOSE, CAPILLARY
Glucose-Capillary: 87 mg/dL (ref 70–99)
Glucose-Capillary: 108 mg/dL — ABNORMAL HIGH (ref 70–99)
Glucose-Capillary: 110 mg/dL — ABNORMAL HIGH (ref 70–99)

## 2020-07-18 LAB — BASIC METABOLIC PANEL
Anion gap: 9 (ref 5–15)
BUN: 12 mg/dL (ref 6–20)
CO2: 33 mmol/L — ABNORMAL HIGH (ref 22–32)
Calcium: 8.8 mg/dL — ABNORMAL LOW (ref 8.9–10.3)
Chloride: 97 mmol/L — ABNORMAL LOW (ref 98–111)
Creatinine, Ser: 0.79 mg/dL (ref 0.44–1.00)
GFR, Estimated: >60 mL/min (ref >60)
Glucose, Bld: 93 mg/dL (ref 70–99)
Potassium: 3.8 mmol/L (ref 3.5–5.1)
Sodium: 139 mmol/L (ref 135–145)

## 2020-07-18 NOTE — Progress Notes (Signed)
PROGRESS NOTE    Felicia Warren  SWN:[id number] DOB: [birth year] DOA: 06/25/2020 PCP: Pcp, No    Chief Complaint  Patient presents with  . unresponsive    Brief Narrative:  50 yo F who presented initially as a Felicia Warren, found with altered mental status in a motel by staff. It is unknown when her last known well was. EMS found patient awake, but altered with [id number]. This was corrected, but she continued to have worsening mental status. Narcan did not provide any response. In ED she required nonrebreather mask and eventual intubation for hypoxic respiratory failure. Chest x-ray with probable multifocal pneumonia. AKI, elevated INR on admit. No prior history in epicas she is from [facility] no known emergency contact.Admitted by PCCM given requirement for airway protection/intubation.   5/4 presented to ED with hypoglycemia and AMS, required intubation, PCCM consult. CXR concerning for PNA. Unasyn initiated. Tx to [facility] for beds. UDS neg, neg tylenol/ salicylates. CTH neg. Lactic acid 5.9->4.9  5/5 Hypotensive, responded to IVF + albumin, on heparin gtt but stopped due to platelet decline, not on pressors, requiring PEEP 10 / FiO2 80. RUQ [place] with cholelithiasis but no cholecystitis.   5/6 Vent needs decreased. LE venous duplex negative.   5/7 diarrhea > Placed flexiseal 5/8  5/8 WUA/SBT, extubated  She's currently being diuresed.  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  No insurance.  SW looking into adult medicaid   Assessment & Plan:   Principal Problem:   Acute respiratory failure with hypoxia and hypercarbia (HCC) Active Problems:   Hypercapnic respiratory failure (HCC)   Elevated LFTs   Acute metabolic encephalopathy   Pulmonary emboli (HCC)   Transaminitis   Hypokalemia   Hypoglycemia  1 acute on chronic hypoxic hypercarbic respiratory failure/probable OSA/OHS -Patient noted to have a rapid response on 5/12  And 5/13 due  to increased work of breathing and desats. -Respiratory status improving on BiPAP as patient tolerated more space out BiPAP. -Goal is to get to BiPAP just at night.  -Goal sats 88 to 92% -Patient was on Lasix and has been transitioned to oral torsemide with good urine output of 2.1 L over the past 24 hours. -Patient is - 37.445 L. -Metabolic alkalosis improved.  Diamox has been discontinued. -It is noted that seal on  BiPAP also remain subpar at times. -Patient with sats of 99% on room air this morning. -We will need outpatient sleep study. -Will need outpatient follow-up with pulmonary.  2.  Acute metabolic encephalopathy -Felt secondary to chronic CO2 retention. -VBG was noted to have compensated respiratory acidosis. -Likely needs outpatient sleep study as patient would likely probable OHS and OSA. -Patient noted to be at risk for needing a trach if ongoing need for BiPAP intermittently as well and ability to lose some weight.  3.  Pulmonary embolism -CT angiogram chest from 06/30/2020 with an area suspicious for segmental thrombus in the left lower lobe associated airspace disease and probably likely pulmonary infarct. -Images concerning for right heart strain. -2D echo done was concerning for McConnell sign. -Lower extremity Dopplers negative for DVT. -Xarelto.   -Outpatient follow-up.     4.  AKI with unknown baseline resolved/AG MA/lactic acidosis resolved/contraction alkalosis -Renal function improved. -Diamox discontinued. -Avoid nephrotoxic agents.  5.  Hypokalemia -Secondary to diuresis.   -Continue current regimen of potassium 40 mEq twice daily.   -Follow.  6.  Demand ischemia -Troponin noted to be downtrending with oxygenation. -2D echo with EF of  55 to 60%, NWMA, McConnell sign with moderately enlarged RV intraventricular septal flattening concerning for RV overload (likely secondary to OHS). -Suspected left lower lobe segmental PE as above. -Continue  Xarelto. -Continue Demadex. -Seen by cardiology and patient may benefit from outpatient right heart cath.  7.  Transaminitis -?  Etiology. -Concern for possible shock liver. -Acute hepatitis panel negative. -Right upper quadrant ultrasound with cholelithiasis and negative for cholecystitis. -LFTs have trended down and transaminitis resolved.  8.  Hypoglycemia -Patient noted on admission to have glucose of 40s. -Hypoglycemia resolved with dextrose infusion on admission. -CBG 87 this morning.  -Follow.  9.  Diarrhea, resolving -Likely in the setting of tube feeds and bowel regimen.  10.  Normocytic anemia/anemia of chronic disease -H&H stable at 10.9.  -Transfusion threshold hemoglobin < 7.  11.  Social barriers -patient from [place] and uninsured; her wish is to return to [place] to collect belongings and move to [place] as she has family here, however she has nowhere here to live yet if she moved - not sure if family would take her in after this hospitalization but this has been brought up to them and it didn't appear to be an option - if she's needing nightly BIPAP, that will be a factor; and/or if she needs continuous O2 that will also be a factor - mobility extremely limited; PT following; staff needs to try and get her OOB to chair with meals as able - until these issues are fixed and/or addressed, she will remain in the hospital a prolonged amount of time  12.  Morbid obesity     DVT prophylaxis: Xarelto Code Status: Full Family Communication: Updated patient.  No family at bedside. Disposition:   Status is: Inpatient    Dispo: The patient is from: Home              Anticipated d/c is to: TBD              Patient currently on BiPAP.  Not medically stable for discharge.   Difficult to place patient yes       Consultants:   Infectious disease: Dr., 06/30/2020  Cardiology: Dr. Mayford Knife 06/27/2020  Pulmonary/PCCM admission  Procedures:   CT angiogram chest  06/30/2020  CT head 06/25/2020  Abdominal ultrasound 06/26/2020  Renal ultrasound 06/26/2020  2D echo 07/11/2020  Lower extremity Dopplers 06/27/2020  Antimicrobials:  Anti-infectives (From admission, onward)   Start     Dose/Rate Route Frequency Ordered Stop   06/26/20 2000  cefTRIAXone (ROCEPHIN) 2 g in sodium chloride 0.9 % 100 mL IVPB  Status:  Discontinued        2 g 200 mL/hr over 30 Minutes Intravenous Every 24 hours 06/26/20 1818 07/01/20 1552   06/25/20 1900  Ampicillin-Sulbactam (UNASYN) 3 g in sodium chloride 0.9 % 100 mL IVPB  Status:  Discontinued        3 g 200 mL/hr over 30 Minutes Intravenous Every 12 hours 06/25/20 1857 06/26/20 1818   06/25/20 1700  cefTRIAXone (ROCEPHIN) 1 g in sodium chloride 0.9 % 100 mL IVPB        1 g 200 mL/hr over 30 Minutes Intravenous  Once 06/25/20 1649 06/25/20 1859   06/25/20 1700  azithromycin (ZITHROMAX) 500 mg in sodium chloride 0.9 % 250 mL IVPB        500 mg 250 mL/hr over 60 Minutes Intravenous  Once 06/25/20 1649 06/25/20 2005       Subjective: Sitting up in room.  No  chest pain.  No shortness of breath.  No abdominal pain.  Tolerated BiPAP overnight.  No complaints.    Objective: Vitals:   07/18/20 1100 07/18/20 1200 07/18/20 1242 07/18/20 1300  BP:      Pulse: 78 88 80   Resp: (!) 24 18  (!) 25  Temp:   97.6 F (36.4 C)   TempSrc:   Oral   SpO2: 99% 95% 99%   Weight:      Height:        Intake/Output Summary (Last 24 hours) at 07/18/2020 1539 Last data filed at 07/18/2020 1100 Gross per 24 hour  Intake --  Output 2250 ml  Net -2250 ml   Filed Weights   07/14/20 0500 07/15/20 0500 07/16/20 0500  Weight: (!) 217.7 kg (!) 218.2 kg (!) 218.3 kg    Examination:  General exam: : NAD Respiratory system: Lungs clear to auscultation bilaterally.  No wheezes, no crackles, no rhonchi.  Normal respiratory effort.  Speaking in full sentences.  Cardiovascular system: Regular rate and rhythm no murmurs rubs or gallops.  No  JVD.  No lower extremity edema.  Gastrointestinal system: Abdomen obese, soft, nontender, nondistended, positive bowel sounds.  No rebound.  No guarding. Central nervous system: Alert and oriented. No focal neurological deficits. Extremities: Symmetric 5 x 5 power. Skin: No rashes, lesions or ulcers Psychiatry: Judgement and insight appear normal. Mood & affect appropriate.    Data Reviewed: I have personally reviewed following labs and imaging studies  CBC: Recent Labs  Lab 07/12/20 0440 07/13/20 0432 07/14/20 0415 07/15/20 0404 07/16/20 0406 07/17/20 0357  WBC 6.0 5.7 5.3 5.4 5.1 5.0  NEUTROABS 4.1 3.9 3.6 3.6 3.5  --   HGB 9.7* 10.0* 10.3* 10.2* 10.3* 10.9*  HCT 34.7* 36.7 37.3 37.0 37.2 37.8  MCV 85.7 85.9 85.9 85.8 85.3 83.6  PLT 266 277 287 287 277 295    Basic Metabolic Panel: Recent Labs  Lab 07/12/20 0440 07/12/20 1552 07/13/20 0432 07/14/20 0415 07/15/20 0404 07/16/20 0406 07/17/20 0357 07/18/20 0423  NA 140   < > 140 140 139 139 140 139  K 2.9*   < > 3.4* 3.5 3.9 3.1* 3.3* 3.8  CL 99   < > 102 103 102 99 96* 97*  CO2 33*   < > 31 32 29 31 35* 33*  GLUCOSE 97   < > 99 101* 98 95 96 93  BUN 8   < > 9 11 11 12 12 12   CREATININE 0.74   < > 0.79 0.87 0.82 0.84 0.76 0.79  CALCIUM 8.2*   < > 8.2* 8.4* 8.5* 8.5* 8.7* 8.8*  MG 2.2  --  1.9 2.0 2.2 1.9 1.9  --   PHOS 3.9  --  3.7 4.1 4.1 4.5  --   --    < > = values in this interval not displayed.    GFR: Estimated Creatinine Clearance: 161.4 mL/min (by C-G formula based on SCr of 0.79 mg/dL).  Liver Function Tests: Recent Labs  Lab 07/12/20 0440 07/13/20 0432 07/14/20 0415 07/15/20 0404 07/16/20 0406  AST 28 29 28  32 27  ALT 27 27 26 21 24   ALKPHOS 51 55 57 56 55  BILITOT 0.7 0.7 0.7 0.6 0.6  PROT 6.1* 6.2* 6.4* 6.5 6.5  ALBUMIN 2.5* 2.6* 2.7* 2.7* 2.9*    CBG: Recent Labs  Lab 07/17/20 0726 07/17/20 1141 07/17/20 1808 07/18/20 0740 07/18/20 1222  GLUCAP 88 133* 144* 87 108*  No  results found for this or any previous visit (from the past 240 hour(s)).       Radiology Studies: No results found.      Scheduled Meds: . (feeding supplement) PROSource Plus  30 mL Oral TID BM  . magnesium oxide  800 mg Oral BID  . mouth rinse  15 mL Mouth Rinse BID  . multivitamin with minerals  1 tablet Oral Daily  . pantoprazole  40 mg Oral QHS  . potassium chloride  40 mEq Oral BID  . Ensure Max Protein  11 oz Oral BID  . Rivaroxaban  15 mg Oral BID WC   Followed by  . [START ON 07/22/2020] rivaroxaban  20 mg Oral Q supper  . torsemide  40 mg Oral BID   Continuous Infusions:   LOS: 23 days    Time spent: 35 minutes    Ramiro Harvestaniel Dalonda Simoni, MD [facility]   To contact the attending provider between 7A-7P or the covering provider during after hours 7P-7A, please log into the web site [email or web address] and access using universal [place] password for that web site. If you do not have the password, please call the hospital operator.  07/18/2020, 3:39 PM

## 2020-07-19 LAB — BASIC METABOLIC PANEL
Anion gap: 10 (ref 5–15)
BUN: 11 mg/dL (ref 6–20)
CO2: 31 mmol/L (ref 22–32)
Calcium: 8.8 mg/dL — ABNORMAL LOW (ref 8.9–10.3)
Chloride: 97 mmol/L — ABNORMAL LOW (ref 98–111)
Creatinine, Ser: 0.9 mg/dL (ref 0.44–1.00)
GFR, Estimated: >60 mL/min (ref >60)
Glucose, Bld: 84 mg/dL (ref 70–99)
Potassium: 4.7 mmol/L (ref 3.5–5.1)
Sodium: 138 mmol/L (ref 135–145)

## 2020-07-19 LAB — GLUCOSE, CAPILLARY
Glucose-Capillary: 96 mg/dL (ref 70–99)
Glucose-Capillary: 109 mg/dL — ABNORMAL HIGH (ref 70–99)
Glucose-Capillary: 124 mg/dL — ABNORMAL HIGH (ref 70–99)

## 2020-07-19 MED ORDER — POTASSIUM CHLORIDE CRYS ER 20 MEQ PO TBCR
40.0000 meq | EXTENDED_RELEASE_TABLET | Freq: Every day | ORAL | Status: DC
  Administered 2020-07-20 – 2020-07-25 (×6): 40 meq via ORAL
  Filled 2020-07-19 (×6): qty 2

## 2020-07-19 NOTE — Progress Notes (Signed)
PROGRESS NOTE    Felicia Warren  ZOX:[id number] DOB: 06/17/1970 DOA: 06/25/2020 PCP: Pcp, No    Chief Complaint  Patient presents with  . unresponsive    Brief Narrative:  50 yo F who presented initially as a Felicia Warren, found with altered mental status in a motel by staff. It is unknown when her last known well was. EMS found patient awake, but altered with [id number]. This was corrected, but she continued to have worsening mental status. Narcan did not provide any response. In ED she required nonrebreather mask and eventual intubation for hypoxic respiratory failure. Chest x-ray with probable multifocal pneumonia. AKI, elevated INR on admit. No prior history [place].Admitted by PCCM given requirement for airway protection/intubation.   5/4 presented to ED with hypoglycemia and AMS, required intubation, PCCM consult. CXR concerning for PNA. Unasyn initiated. [facility] UDS neg, neg tylenol/ salicylates. CTH neg. Lactic acid 5.9->4.9  5/5 Hypotensive, responded to IVF + albumin, on heparin gtt but stopped due to platelet decline, not on pressors, requiring PEEP 10 / FiO2 80. RUQ US with cholelithiasis but no cholecystitis.   5/6 Vent needs decreased. LE venous duplex negative.   5/7 diarrhea > Placed flexiseal 5/8  5/8 WUA/SBT, extubated  She's currently being diuresed.  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  No insurance.  SW looking into adult medicaid   Assessment & Plan:   Principal Problem:   Acute respiratory failure with hypoxia and hypercarbia (HCC) Active Problems:   Hypercapnic respiratory failure (HCC)   Elevated LFTs   Acute metabolic encephalopathy   Pulmonary emboli (HCC)   Transaminitis   Hypokalemia   Hypoglycemia  1 acute on chronic hypoxic hypercarbic respiratory failure/probable OSA/OHS -Patient noted to have a rapid response on 5/12  And 5/13 due  to increased work of breathing and desats. -Respiratory status improving on BiPAP as patient tolerated more space out BiPAP. -Patient now needing BiPAP nightly at present. -Goal sats 88 to 92% -Patient was on Lasix and has been transitioned to oral torsemide with UOP 1.950 L over the past 24 hours.  -Patient is -35.735 L. -Metabolic alkalosis improved.  Diamox has been discontinued. -It is noted that seal on  BiPAP also remain subpar at times. -Patient with sats of 92% on room air this morning.   -Will need outpatient sleep study.   -Will need outpatient follow-up with pulmonary.   2.  Acute metabolic encephalopathy -Felt secondary to chronic CO2 retention. -VBG was noted to have compensated respiratory acidosis. -Likely needs outpatient sleep study as patient would likely probable OHS and OSA. -Patient noted to be at risk for needing a trach if ongoing need for BiPAP intermittently as well and ability to lose some weight. -Currently tolerating BiPAP nightly.  3.  Pulmonary embolism -CT angiogram chest from 06/30/2020 with an area suspicious for segmental thrombus in the left lower lobe associated airspace disease and probably likely pulmonary infarct. -Images concerning for right heart strain. -2D echo done was concerning for McConnell sign. -Lower extremity Dopplers negative for DVT. -Continue Xarelto.    -Outpatient follow-up.     4.  AKI with unknown baseline resolved/AG MA/lactic acidosis resolved/contraction alkalosis -Renal function improved. -Diamox discontinued. -Avoid nephrotoxic agents.  5.  Hypokalemia -Secondary to diuresis.   -Potassium currently of 4.7 today.   -Decrease oral potassium supplementation to 40 mEq daily.   -Follow.   6.  Demand ischemia -Troponin  noted to be downtrending with oxygenation. -2D echo with EF of 55 to 60%, NWMA, McConnell sign with moderately enlarged RV intraventricular septal flattening concerning for RV overload (likely secondary to  OHS). -Suspected left lower lobe segmental PE as above. -Continue Xarelto. -Continue Demadex. -Seen by cardiology and patient may benefit from outpatient right heart cath. -We will need outpatient follow-up with cardiology  7.  Transaminitis -?  Etiology. -Concern for possible shock liver. -Acute hepatitis panel negative. -Right upper quadrant ultrasound with cholelithiasis and negative for cholecystitis. -LFTs have trended down and transaminitis resolved.  8.  Hypoglycemia -Patient noted on admission to have glucose of 40s. -Hypoglycemia resolved with dextrose infusion on admission.   -CBG at 96.   -Follow.   9.  Diarrhea, resolving -Likely in the setting of tube feeds and bowel regimen.  10.  Normocytic anemia/anemia of chronic disease -H&H stable at 10.9.  -Transfusion threshold hemoglobin < 7.  11.  Social barriers -patient from [place] and uninsured; her wish is to return to [place] to [place] belongings and move to [place] as she has family here, however she has nowhere here to live yet if she moved - not sure if family would take her in after this hospitalization but this has been brought up to them and it didn't appear to be an option - if she's needing nightly BIPAP, that will be a factor; and/or if she needs continuous O2 that will also be a factor - mobility extremely limited; PT following; staff needs to try and get her OOB to chair with meals as able - until these issues are fixed and/or addressed, she will remain in the hospital a prolonged amount of time  12.  Morbid obesity     DVT prophylaxis: Xarelto Code Status: Full Family Communication: Updated patient.  No family at bedside. Disposition:   Status is: Inpatient    Dispo: The patient is from: Home              Anticipated d/c is to: TBD              Patient currently on BiPAP nightly.  Medically stable for discharge.  Currently unsafe disposition.   Difficult to place patient yes        Consultants:   Infectious disease: Dr., 06/30/2020  Cardiology: Dr. Mayford Knife 06/27/2020  Pulmonary/PCCM admission  Procedures:   CT angiogram chest 06/30/2020  CT head 06/25/2020  Abdominal ultrasound 06/26/2020  Renal ultrasound 06/26/2020  2D echo 07/11/2020  Lower extremity Dopplers 06/27/2020  Antimicrobials:  Anti-infectives (From admission, onward)   Start     Dose/Rate Route Frequency Ordered Stop   06/26/20 2000  cefTRIAXone (ROCEPHIN) 2 g in sodium chloride 0.9 % 100 mL IVPB  Status:  Discontinued        2 g 200 mL/hr over 30 Minutes Intravenous Every 24 hours 06/26/20 1818 07/01/20 1552   06/25/20 1900  Ampicillin-Sulbactam (UNASYN) 3 g in sodium chloride 0.9 % 100 mL IVPB  Status:  Discontinued        3 g 200 mL/hr over 30 Minutes Intravenous Every 12 hours 06/25/20 1857 06/26/20 1818   06/25/20 1700  cefTRIAXone (ROCEPHIN) 1 g in sodium chloride 0.9 % 100 mL IVPB        1 g 200 mL/hr over 30 Minutes Intravenous  Once 06/25/20 1649 06/25/20 1859   06/25/20 1700  azithromycin (ZITHROMAX) 500 mg in sodium chloride 0.9 % 250 mL IVPB        500 mg  250 mL/hr over 60 Minutes Intravenous  Once 06/25/20 1649 06/25/20 2005       Subjective: Patient laying in bed.  Denies chest pain.  No shortness of breath.  States she has been feeling better and getting stronger.  States now only requiring BiPAP at bedtime.   Objective: Vitals:   07/19/20 0107 07/19/20 0531 07/19/20 0800 07/19/20 1158  BP:    106/66  Pulse:   82 79  Resp: 18  20 (!) 21  Temp:  98.4 F (36.9 C)    TempSrc:  Axillary    SpO2:   91% 92%  Weight:      Height:        Intake/Output Summary (Last 24 hours) at 07/19/2020 1246 Last data filed at 07/19/2020 0931 Gross per 24 hour  Intake 480 ml  Output 1700 ml  Net -1220 ml   Filed Weights   07/14/20 0500 07/15/20 0500 07/16/20 0500  Weight: (!) 217.7 kg (!) 218.2 kg (!) 218.3 kg    Examination:  General exam: NAD. Respiratory system: CTA B.  No  wheezes, no crackles, no rhonchi.  Normal respiratory effort.  Speaking in full sentences.  Cardiovascular system: RRR no murmurs rubs or gallops.  No JVD.  No lower extremity edema.  Gastrointestinal system: Abdomen is obese, soft, nontender, nondistended, positive bowel sounds.  No rebound.  No guarding.  Central nervous system: Alert and oriented.  No focal neurological deficits.  Extremities: Symmetric 5 x 5 power. Skin: No rashes, lesions or ulcers Psychiatry: Judgement and insight appear normal. Mood & affect appropriate.    Data Reviewed: I have personally reviewed following labs and imaging studies  CBC: Recent Labs  Lab 07/13/20 0432 07/14/20 0415 07/15/20 0404 07/16/20 0406 07/17/20 0357  WBC 5.7 5.3 5.4 5.1 5.0  NEUTROABS 3.9 3.6 3.6 3.5  --   HGB 10.0* 10.3* 10.2* 10.3* 10.9*  HCT 36.7 37.3 37.0 37.2 37.8  MCV 85.9 85.9 85.8 85.3 83.6  PLT 277 287 287 277 295    Basic Metabolic Panel: Recent Labs  Lab 07/13/20 0432 07/14/20 0415 07/15/20 0404 07/16/20 0406 07/17/20 0357 07/18/20 0423 07/19/20 0217  NA 140 140 139 139 140 139 138  K 3.4* 3.5 3.9 3.1* 3.3* 3.8 4.7  CL 102 103 102 99 96* 97* 97*  CO2 31 32 29 31 35* 33* 31  GLUCOSE 99 101* 98 95 96 93 84  BUN 9 11 11 12 12 12 11   CREATININE 0.79 0.87 0.82 0.84 0.76 0.79 0.90  CALCIUM 8.2* 8.4* 8.5* 8.5* 8.7* 8.8* 8.8*  MG 1.9 2.0 2.2 1.9 1.9  --   --   PHOS 3.7 4.1 4.1 4.5  --   --   --     GFR: Estimated Creatinine Clearance: 143.5 mL/min (by C-G formula based on SCr of 0.9 mg/dL).  Liver Function Tests: Recent Labs  Lab 07/13/20 0432 07/14/20 0415 07/15/20 0404 07/16/20 0406  AST 29 28 32 27  ALT 27 26 21 24   ALKPHOS 55 57 56 55  BILITOT 0.7 0.7 0.6 0.6  PROT 6.2* 6.4* 6.5 6.5  ALBUMIN 2.6* 2.7* 2.7* 2.9*    CBG: Recent Labs  Lab 07/18/20 0740 07/18/20 1222 07/18/20 1731 07/19/20 0735 07/19/20 1157  GLUCAP 87 108* 110* 96 109*     No results found for this or any previous  visit (from the past 240 hour(s)).       Radiology Studies: No results found.      Scheduled  Meds: . (feeding supplement) PROSource Plus  30 mL Oral TID BM  . magnesium oxide  800 mg Oral BID  . mouth rinse  15 mL Mouth Rinse BID  . multivitamin with minerals  1 tablet Oral Daily  . pantoprazole  40 mg Oral QHS  . potassium chloride  40 mEq Oral BID  . Ensure Max Protein  11 oz Oral BID  . Rivaroxaban  15 mg Oral BID WC   Followed by  . [START ON 07/22/2020] rivaroxaban  20 mg Oral Q supper  . torsemide  40 mg Oral BID   Continuous Infusions:   LOS: 24 days    Time spent: 35 minutes    Ramiro Harvest, MD [facility]   To contact the attending provider between 7A-7P or the covering provider during after hours 7P-7A, please log into the web site [email or web address] and access using universal [address] password for that web site. If you do not have the password, please call the hospital operator.  07/19/2020, 12:46 PM

## 2020-07-19 NOTE — Progress Notes (Signed)
Occupational Therapy Treatment Patient Details Name: Felicia Warren MRN: [id number] DOB: [birth year] Today's Date: 07/19/2020    History of present illness Patient is a 50 year old female admitted with hypoglycemia and AMS, needing intubation on 5/4 and extubated on 5/8. Unsure of past medical history, patient currently living in [place] plans to move to [place] to be closer to family.   OT comments  Pt ambulated around room and to bathroom for toileting and performed pericare with supervision. Pt with HR to 150 with exertion. Sp02 91% on RA.  Follow Up Recommendations  Home health OT;Supervision - Intermittent    Equipment Recommendations  3 in 1 bedside commode (bariatric)    Recommendations for Other Services      Precautions / Restrictions Precautions Precautions: Fall Precaution Comments: watch HR       Mobility Bed Mobility Overal bed mobility: Needs Assistance Bed Mobility: Supine to Sit;Sit to Supine     Supine to sit: Supervision Sit to supine: Supervision   General bed mobility comments: supervision for lines    Transfers Overall transfer level: Needs assistance Equipment used: None Transfers: Sit to/from Stand Sit to Stand: Supervision         General transfer comment: no support within room/bathroom    Balance Overall balance assessment: Needs assistance   Sitting balance-Leahy Scale: Good     Standing balance support: Single extremity supported Standing balance-Leahy Scale: Good Standing balance comment: steadies herself with one hand during pericare                           ADL either performed or assessed with clinical judgement   ADL Overall ADL's : Needs assistance/impaired     Grooming: Wash/dry hands;Sitting;Set up                   Toilet Transfer: Supervision/safety;Ambulation;BSC   Toileting- Architect and Hygiene: Supervision/safety;Sit to/from stand       Functional mobility during ADLs:  Supervision/safety General ADL Comments: HR to 150     Vision       Perception     Praxis      Cognition Arousal/Alertness: Awake/alert Behavior During Therapy: WFL for tasks assessed/performed Overall Cognitive Status: Within Functional Limits for tasks assessed                                          Exercises     Shoulder Instructions       General Comments      Pertinent Vitals/ Pain       Pain Assessment: No/denies pain  Home Living Family/patient expects to be discharged to:: Private residence Living Arrangements: Alone Available Help at Discharge: Family Type of Home: Apartment Home Access: Stairs to enter                                Prior Functioning/Environment              Frequency  Min 2X/week        Progress Toward Goals  OT Goals(current goals can now be found in the care plan section)  Progress towards OT goals: Progressing toward goals  Acute Rehab OT Goals Patient Stated Goal: get back to usual routine (would not elaborate) OT Goal Formulation: With patient Time For Goal Achievement:  07/29/20 Potential to Achieve Goals: Good  Plan Discharge plan remains appropriate    Co-evaluation                 AM-PAC OT "6 Clicks" Daily Activity     Outcome Measure   Help from another person eating meals?: None Help from another person taking care of personal grooming?: A Little Help from another person toileting, which includes using toliet, bedpan, or urinal?: A Little Help from another person bathing (including washing, rinsing, drying)?: A Little Help from another person to put on and taking off regular upper body clothing?: None Help from another person to put on and taking off regular lower body clothing?: A Little 6 Click Score: 20    End of Session    OT Visit Diagnosis: Other abnormalities of gait and mobility (R26.89);Muscle weakness (generalized) (M62.81);Other symptoms and signs  involving cognitive function   Activity Tolerance Patient tolerated treatment well   Patient Left in bed;with call bell/phone within reach   Nurse Communication          Time: 6546-5035 OT Time Calculation (min): 43 min  Charges: OT General Charges $OT Visit: 1 Visit OT Treatments $Self Care/Home Management : 38-52 mins  [name], OTR/L Acute Rehabilitation Services Pager: [phone] Office: [phone]   [name] 07/19/2020, 3:55 PM

## 2020-07-19 NOTE — Plan of Care (Signed)

## 2020-07-20 LAB — MAGNESIUM: Magnesium: 2.1 mg/dL (ref 1.7–2.4)

## 2020-07-20 LAB — BASIC METABOLIC PANEL
Anion gap: 7 (ref 5–15)
BUN: 13 mg/dL (ref 6–20)
CO2: 36 mmol/L — ABNORMAL HIGH (ref 22–32)
Calcium: 8.7 mg/dL — ABNORMAL LOW (ref 8.9–10.3)
Chloride: 96 mmol/L — ABNORMAL LOW (ref 98–111)
Creatinine, Ser: 1.07 mg/dL — ABNORMAL HIGH (ref 0.44–1.00)
GFR, Estimated: >60 mL/min (ref >60)
Glucose, Bld: 104 mg/dL — ABNORMAL HIGH (ref 70–99)
Potassium: 3.8 mmol/L (ref 3.5–5.1)
Sodium: 139 mmol/L (ref 135–145)

## 2020-07-20 LAB — GLUCOSE, CAPILLARY
Glucose-Capillary: 104 mg/dL — ABNORMAL HIGH (ref 70–99)
Glucose-Capillary: 109 mg/dL — ABNORMAL HIGH (ref 70–99)
Glucose-Capillary: 122 mg/dL — ABNORMAL HIGH (ref 70–99)

## 2020-07-20 NOTE — Plan of Care (Signed)
  Problem: Clinical Measurements: Goal: Respiratory complications will improve Outcome: Progressing   Problem: Activity: Goal: Risk for activity intolerance will decrease Outcome: Progressing   Problem: Coping: Goal: Level of anxiety will decrease Outcome: Progressing   Problem: Nutrition: Goal: Adequate nutrition will be maintained Outcome: Adequate for Discharge

## 2020-07-20 NOTE — Evaluation (Signed)
Clinical/Bedside Swallow Evaluation Patient Details  Name: Felicia Warren MRN: [id number] Date of Birth: 03-22-1970  Today's Date: 07/20/2020 Time: SLP Start Time (ACUTE ONLY): 0946 SLP Stop Time (ACUTE ONLY): 1015 SLP Time Calculation (min) (ACUTE ONLY): 29 min  Past Medical History: History reviewed. No pertinent past medical history. Past Surgical History: History reviewed. No pertinent surgical history. HPI:  50 y/o Female who presented as a Felicia Warren on 06/25/20 found with altered mental status in a motel by staff.  It is unknown when her last known well was per chart review.  EMS found patient awake, but altered glucose was initially 40.  This was corrected, but she continued to have worsening mental status.  Narcan did not provide any response.  In ED she required nonrebreather mask and eventual intubation for hypoxic respiratory failure for a period of 4 days. Previously seen by ST for BSE on 07/03/20 and s/o with Dysphagia 3/thin liquid diet; re-consulted to upgrade diet as able; CXR on 07/05/20 indicated Cardiomegaly and vascular congestion with airspace disease/lung infarct by recent chest CT.  Nursing reported pt is taking medications and consuming current diet without complications.    Assessment / Plan / Recommendation Clinical Impression  Pt seen for clinical swallowing evaluation with adequate mastication (even with missing dentition present), timely swallow and adequate oral/pharyngeal clearance without s/s of overt aspiration present during trial.  Nursing stated pt occasionally is speaking off topic/task during conversation, so decreased attention/cognition could impact overall swallowing during meals.  Pt utilized slow rate and small bites/sips during evaluation with independence and SLP discussed impact of swallowing/breathing reciprocity given recent respiratory status.  Recommend initiating a regular/thin liquid diet with ST to f/u for diet tolerance d/t cognitive changes during this  hospitalization.  Speech/language/cognitive evaluation may be warranted prior to d/c prn. Thank you for this consult. SLP Visit Diagnosis: Dysphagia, unspecified (R13.10)    Aspiration Risk  Mild aspiration risk    Diet Recommendation   Regular/thin liquids (heart healthy)  Medication Administration: Whole meds with liquid    Other  Recommendations Oral Care Recommendations: Oral care BID   Follow up Recommendations Skilled Nursing facility      Frequency and Duration min 1 x/week  1 week       Prognosis Prognosis for Safe Diet Advancement: Good      Swallow Study   General Date of Onset: 06/25/20 HPI: 50 y/o Female who presented as a Felicia Warren, found with altered mental status in a motel by staff.  It is unknown when her last known well was.  EMS found patient awake, but altered glucose was initially 40.  This was corrected, but she continued to have worsening mental status.  Narcan did not provide any response.  In ED she required nonrebreather mask and eventual intubation for hypoxic respiratory failure.  Chest x-ray with probable multifocal pneumonia; previously seen by ST on 07/03/20 and s/o with Dysphagia 3/thin liquid diet; re-consulted to upgrade diet as able. Type of Study: Bedside Swallow Evaluation Previous Swallow Assessment: 07/03/20 recommended Dysphagia 3/thin liquids and s/o Diet Prior to this Study: Dysphagia 2 (chopped);Thin liquids Temperature Spikes Noted: Yes (low grade (99)) Respiratory Status: Room air (Nocturnal bipap only per nursing) History of Recent Intubation: Yes Length of Intubations (days): 4 days Date extubated: 06/29/20 Behavior/Cognition: Alert;Cooperative;Requires cueing Oral Cavity Assessment: Within Functional Limits Oral Care Completed by SLP: No Oral Cavity - Dentition: Missing dentition;Poor condition Vision: Functional for self-feeding Self-Feeding Abilities: Able to feed self;Needs assist Patient Positioning: Upright in  bed Baseline  Vocal Quality: Normal;Low vocal intensity Volitional Cough: Strong Volitional Swallow: Able to elicit    Oral/Motor/Sensory Function Overall Oral Motor/Sensory Function: Within functional limits   Ice Chips Ice chips: Not tested   Thin Liquid Thin Liquid: Within functional limits Presentation: Cup;Straw    Nectar Thick Nectar Thick Liquid: Not tested   Honey Thick Honey Thick Liquid: Not tested   Puree Puree: Within functional limits Presentation: Self Fed   Solid     Solid: Within functional limits Presentation: Self Fed Oral Phase Functional Implications: Impaired mastication      Felicia Warren, M.S., CCC-SLP 07/20/2020,10:31 AM

## 2020-07-20 NOTE — Progress Notes (Signed)
PROGRESS NOTE    Felicia Warren  [id number]:[id number] DOB: 1970/07/13 DOA: 06/25/2020 PCP: Felicia Warren    Chief Complaint  Patient presents with  . unresponsive    Brief Narrative:  50 yo F who presented initially as a Felicia Warren, found with altered mental status in a motel by staff. It is unknown when her last known well was. EMS found patient awake, but altered with aglucoseof40. This was corrected, but she continued to have worsening mental status. Narcan did not provide any response. In ED she required nonrebreather mask and eventual intubation for hypoxic respiratory failure. Chest x-ray with probable multifocal pneumonia. AKI, elevated INR on admit. Warren prior history in epicas she is from [facility] Warren known emergency contact.Admitted by PCCM given requirement for airway protection/intubation.   5/4 presented to ED with hypoglycemia and AMS, required intubation, PCCM consult. CXR concerning for PNA. Unasyn initiated. Tx to [facility] for beds. UDS neg, neg tylenol/ salicylates. CTH neg. Lactic acid 5.9->4.9  5/5 Hypotensive, responded to IVF + albumin, on heparin gtt but stopped due to platelet decline, not on pressors, requiring PEEP 10 / FiO2 80. RUQ [place] with cholelithiasis but Warren cholecystitis.   5/6 Vent needs decreased. LE venous duplex negative.   5/7 diarrhea > Placed flexiseal 5/8  5/8 WUA/SBT, extubated  She's currently being diuresed.  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  Warren insurance.  SW looking into adult medicaid   Assessment & Plan:   Principal Problem:   Acute respiratory failure with hypoxia and hypercarbia (HCC) Active Problems:   Hypercapnic respiratory failure (HCC)   Elevated LFTs   Acute metabolic encephalopathy   Pulmonary emboli (HCC)   Transaminitis   Hypokalemia   Hypoglycemia  1 acute on chronic hypoxic hypercarbic respiratory failure/probable OSA/OHS -Patient noted to have a rapid response on 5/12  And 5/13 due  to increased work of breathing and desats. -Respiratory status improving on BiPAP as patient tolerated more space out BiPAP. -Patient now needing BiPAP nightly at present. -Goal sats 88 to 92% -Patient was on Lasix and has been transitioned to oral torsemide with UOP 1.950 L over the past 24 hours.  -Patient is -33.635 L. -Metabolic alkalosis improved.  Diamox has been discontinued. -It is noted that seal on  BiPAP also remain subpar at times. -Patient with sats of 95 % on room air this morning.   -Will need outpatient sleep study.   -Will need outpatient follow-up with pulmonary.   2.  Acute metabolic encephalopathy -Secondary to chronic CO2 retention.  -VBG was noted to have compensated respiratory acidosis. -Likely needs outpatient sleep study as patient would likely probable OHS and OSA. -Patient noted to be at risk for needing a trach if ongoing need for BiPAP intermittently as well and ability to lose some weight. -Currently tolerating BiPAP nightly.  3.  Pulmonary embolism -CT angiogram chest from 06/30/2020 with an area suspicious for segmental thrombus in the left lower lobe associated airspace disease and probably likely pulmonary infarct. -Images concerning for right heart strain. -2D echo done was concerning for McConnell sign. -Lower extremity Dopplers negative for DVT. -Continue Xarelto.    -Outpatient follow-up.     4.  AKI with unknown baseline resolved/AG MA/lactic acidosis resolved/contraction alkalosis -Renal function improved. -Diamox discontinued. -Avoid nephrotoxic agents.  5.  Hypokalemia -Secondary to diuresis.   -Potassium at 3.8.   -Continue oral daily potassium supplementation of 40 mEq daily.   -Follow.   6.  Demand ischemia -Troponin  noted to be downtrending with oxygenation. -2D echo with EF of 55 to 60%, NWMA, McConnell sign with moderately enlarged RV intraventricular septal flattening concerning for RV overload (likely secondary to  OHS). -Suspected left lower lobe segmental PE as above. -Continue Xarelto. -Continue Demadex. -Seen by cardiology and patient may benefit from outpatient right heart cath. -We will need outpatient follow-up with cardiology  7.  Transaminitis -?  Etiology. -Concern for possible shock liver. -Acute hepatitis panel negative. -Right upper quadrant ultrasound with cholelithiasis and negative for cholecystitis. -LFTs have trended down and transaminitis resolved.  8.  Hypoglycemia -Patient noted on admission to have glucose of 40s. -Hypoglycemia resolved with dextrose infusion on admission.   -CBG 104 this morning.   -Follow.  9.  Diarrhea, resolving -Likely in the setting of tube feeds and bowel regimen.  10.  Normocytic anemia/anemia of chronic disease -Hemoglobin stable at 10.9.  -Transfusion threshold hemoglobin < 7.  11.  Social barriers -patient from [place] and uninsured; her [place] to return to [place] to [place] and move to [place] as she has family here, however she has nowhere here to live yet if she moved - not sure if family would take her in after this hospitalization but this has been brought up to them and it didn't appear to be an option - if she's needing nightly BIPAP, that will be a factor; and/or if she needs continuous O2 that will also be a factor - mobility extremely limited; PT following; staff needs to try and get her OOB to chair with meals as able - until these issues are fixed and/or addressed, she will remain in the hospital a prolonged amount of time  12.  Morbid obesity     DVT prophylaxis: Xarelto Code Status: Full Family Communication: Updated patient.  Warren family at bedside. Disposition:   Status is: Inpatient    Dispo: The patient is from: Home              Anticipated d/c is to: TBD              Patient currently on BiPAP nightly.  Medically stable for discharge.  Currently unsafe disposition.  TOC consulted to help with  disposition.   Difficult to place patient yes       Consultants:   Infectious disease: Dr., 06/30/2020  Cardiology: Dr. Mayford Knifeurner 06/27/2020  Pulmonary/PCCM admission  Procedures:   CT angiogram chest 06/30/2020  CT head 06/25/2020  Abdominal ultrasound 06/26/2020  Renal ultrasound 06/26/2020  2D echo 07/11/2020  Lower extremity Dopplers 06/27/2020  Antimicrobials:  Anti-infectives (From admission, onward)   Start     Dose/Rate Route Frequency Ordered Stop   06/26/20 2000  cefTRIAXone (ROCEPHIN) 2 g in sodium chloride 0.9 % 100 mL IVPB  Status:  Discontinued        2 g 200 mL/hr over 30 Minutes Intravenous Every 24 hours 06/26/20 1818 07/01/20 1552   06/25/20 1900  Ampicillin-Sulbactam (UNASYN) 3 g in sodium chloride 0.9 % 100 mL IVPB  Status:  Discontinued        3 g 200 mL/hr over 30 Minutes Intravenous Every 12 hours 06/25/20 1857 06/26/20 1818   06/25/20 1700  cefTRIAXone (ROCEPHIN) 1 g in sodium chloride 0.9 % 100 mL IVPB        1 g 200 mL/hr over 30 Minutes Intravenous  Once 06/25/20 1649 06/25/20 1859   06/25/20 1700  azithromycin (ZITHROMAX) 500 mg in sodium chloride 0.9 % 250 mL IVPB  500 mg 250 mL/hr over 60 Minutes Intravenous  Once 06/25/20 1649 06/25/20 2005       Subjective: Sitting up in bed.  Ate lunch.  Denies chest pain.  Warren shortness of breath.  Feeling better.  Tolerating BiPAP at bedtime.    Objective: Vitals:   07/20/20 0003 07/20/20 0426 07/20/20 0500 07/20/20 0544  BP:    (!) 110/54  Pulse: 81 68  64  Resp:    20  Temp:    99.5 F (37.5 C)  TempSrc:    Axillary  SpO2: 93% 96%  96%  Weight:   (!) 214.1 kg   Height:        Intake/Output Summary (Last 24 hours) at 07/20/2020 1324 Last data filed at 07/20/2020 0900 Gross per 24 hour  Intake 1080 ml  Output 1460 ml  Net -380 ml   Filed Weights   07/15/20 0500 07/16/20 0500 07/20/20 0500  Weight: (!) 218.2 kg (!) 218.3 kg (!) 214.1 kg    Examination:  General exam: : NAD Respiratory  system: CTA B anterior lung fields.  Warren wheezes, Warren rhonchi.  Speaking in full sentences.  Normal respiratory effort. Cardiovascular system: Regular rate and rhythm Warren murmurs rubs or gallops.  Warren JVD.  Warren lower extremity edema.  Gastrointestinal system: Abdomen soft, nontender, nondistended, positive bowel sounds.  Warren rebound.  Warren guarding. Central nervous system: Alert and oriented. Warren focal neurological deficits. Extremities: Symmetric 5 x 5 power. Skin: Warren rashes, lesions or ulcers Psychiatry: Judgement and insight appear normal. Mood & affect appropriate.  Data Reviewed: I have personally reviewed following labs and imaging studies  CBC: Recent Labs  Lab 07/14/20 0415 07/15/20 0404 07/16/20 0406 07/17/20 0357  WBC 5.3 5.4 5.1 5.0  NEUTROABS 3.6 3.6 3.5  --   HGB 10.3* 10.2* 10.3* 10.9*  HCT 37.3 37.0 37.2 37.8  MCV 85.9 85.8 85.3 83.6  PLT 287 287 277 295    Basic Metabolic Panel: Recent Labs  Lab 07/14/20 0415 07/15/20 0404 07/16/20 0406 07/17/20 0357 07/18/20 0423 07/19/20 0217 07/20/20 0405  NA 140 139 139 140 139 138 139  K 3.5 3.9 3.1* 3.3* 3.8 4.7 3.8  CL 103 102 99 96* 97* 97* 96*  CO2 32 29 31 35* 33* 31 36*  GLUCOSE 101* 98 95 96 93 84 104*  BUN 11 11 12 12 12 11 13   CREATININE 0.87 0.82 0.84 0.76 0.79 0.90 1.07*  CALCIUM 8.4* 8.5* 8.5* 8.7* 8.8* 8.8* 8.7*  MG 2.0 2.2 1.9 1.9  --   --  2.1  PHOS 4.1 4.1 4.5  --   --   --   --     GFR: Estimated Creatinine Clearance: 119 mL/min (A) (by C-G formula based on SCr of 1.07 mg/dL (H)).  Liver Function Tests: Recent Labs  Lab 07/14/20 0415 07/15/20 0404 07/16/20 0406  AST 28 32 27  ALT 26 21 24   ALKPHOS 57 56 55  BILITOT 0.7 0.6 0.6  PROT 6.4* 6.5 6.5  ALBUMIN 2.7* 2.7* 2.9*    CBG: Recent Labs  Lab 07/19/20 0735 07/19/20 1157 07/19/20 1844 07/20/20 0749 07/20/20 1145  GLUCAP 96 109* 124* 104* 109*     Warren results found for this or any previous visit (from the past 240 hour(s)).        Radiology Studies: Warren results found.      Scheduled Meds: . (feeding supplement) PROSource Plus  30 mL Oral TID BM  . magnesium oxide  800 mg Oral BID  . mouth rinse  15 mL Mouth Rinse BID  . multivitamin with minerals  1 tablet Oral Daily  . pantoprazole  40 mg Oral QHS  . potassium chloride  40 mEq Oral Daily  . Ensure Max Protein  11 oz Oral BID  . Rivaroxaban  15 mg Oral BID WC   Followed by  . [START ON 07/22/2020] rivaroxaban  20 mg Oral Q supper  . torsemide  40 mg Oral BID   Continuous Infusions:   LOS: 25 days    Time spent: 35 minutes    Ramiro Harvest, MD [facility]   To contact the attending provider between 7A-7P or the covering provider during after hours 7P-7A, please log into the web site [email or web address] and access using universal [place] password for that web site. If you do not have the password, please call the hospital operator.  07/20/2020, 1:24 PM

## 2020-07-21 LAB — BASIC METABOLIC PANEL
Anion gap: 7 (ref 5–15)
BUN: 14 mg/dL (ref 6–20)
CO2: 36 mmol/L — ABNORMAL HIGH (ref 22–32)
Calcium: 8.6 mg/dL — ABNORMAL LOW (ref 8.9–10.3)
Chloride: 95 mmol/L — ABNORMAL LOW (ref 98–111)
Creatinine, Ser: 0.92 mg/dL (ref 0.44–1.00)
GFR, Estimated: >60 mL/min (ref >60)
Glucose, Bld: 92 mg/dL (ref 70–99)
Potassium: 3.5 mmol/L (ref 3.5–5.1)
Sodium: 138 mmol/L (ref 135–145)

## 2020-07-21 LAB — GLUCOSE, CAPILLARY
Glucose-Capillary: 107 mg/dL — ABNORMAL HIGH (ref 70–99)
Glucose-Capillary: 121 mg/dL — ABNORMAL HIGH (ref 70–99)
Glucose-Capillary: 125 mg/dL — ABNORMAL HIGH (ref 70–99)
Glucose-Capillary: 107 mg/dL — ABNORMAL HIGH (ref 70–99)

## 2020-07-21 NOTE — Plan of Care (Signed)
  Problem: Clinical Measurements: Goal: Diagnostic test results will improve Outcome: Progressing Goal: Respiratory complications will improve Outcome: Progressing   Problem: Activity: Goal: Risk for activity intolerance will decrease Outcome: Progressing   Problem: Skin Integrity: Goal: Risk for impaired skin integrity will decrease Outcome: Progressing   Problem: Nutrition: Goal: Adequate nutrition will be maintained Outcome: Adequate for Discharge   Problem: Coping: Goal: Level of anxiety will decrease Outcome: Adequate for Discharge   Problem: Pain Managment: Goal: General experience of comfort will improve Outcome: Adequate for Discharge

## 2020-07-21 NOTE — Progress Notes (Signed)
PROGRESS NOTE    Felicia Warren  [id number]:[id number] DOB: 03/15/1970 DOA: 06/25/2020 PCP: Pcp, No    Chief Complaint  Patient presents with  . unresponsive    Brief Narrative:  50 yo F who presented initially as a Erskine SquibbJane Doe, found with altered mental status in a motel by staff. It is unknown when her last known well was. EMS found patient awake, but altered with aglucoseof40. This was corrected, but she continued to have worsening mental status. Narcan did not provide any response. In ED she required nonrebreather mask and eventual intubation for hypoxic respiratory failure. Chest x-ray with probable multifocal pneumonia. AKI, elevated INR on admit. [place] [facility] [place].Admitted by PCCM given requirement for airway protection/intubation.   5/4 presented to ED with hypoglycemia and AMS, required intubation, PCCM consult. CXR concerning for PNA. Unasyn initiated. [facility] UDS neg, neg tylenol/ salicylates. CTH neg. Lactic acid 5.9->4.9  5/5 Hypotensive, responded to IVF + albumin, on heparin gtt but stopped due to platelet decline, not on pressors, requiring PEEP 10 / FiO2 80. RUQ US with cholelithiasis but no cholecystitis.   5/6 Vent needs decreased. LE venous duplex negative.   5/7 diarrhea > Placed flexiseal 5/8  5/8 WUA/SBT, extubated  She's currently being diuresed.  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  No insurance.  SW looking into adult medicaid   Assessment & Plan:   Principal Problem:   Acute respiratory failure with hypoxia and hypercarbia (HCC) Active Problems:   Hypercapnic respiratory failure (HCC)   Elevated LFTs   Acute metabolic encephalopathy   Pulmonary emboli (HCC)   Transaminitis   Hypokalemia   Hypoglycemia  1 acute on chronic hypoxic hypercarbic respiratory failure/probable OSA/OHS -Patient noted to have a rapid response on 5/12  And 5/13 due  to increased work of breathing and desats. -Respiratory status improving on BiPAP as patient tolerated more spaceD out BiPAP. -Patient now needing BiPAP nightly at present. -Goal sats 88 to 92% -Patient was on Lasix and has been transitioned to oral torsemide with UOP 1.1 L over the past 24 hours.  -Patient is -33.215 L. -Metabolic acidosis has resolved.  Diamox discontinued.. -It is noted that seal on  BiPAP also remain subpar at times. -Patient with sats of 92% on room air this morning.   -Will need outpatient sleep study.   -Will need outpatient follow-up with pulmonary.   2.  Acute metabolic encephalopathy -Secondary to chronic CO2 retention.  -Clinical improvement. -VBG was noted to have compensated respiratory acidosis. -Likely needs outpatient sleep study as patient would likely probable OHS and OSA. -Patient noted to be at risk for needing a trach if ongoing need for BiPAP intermittently as well and ability to lose some weight. -Currently tolerating BiPAP nightly only. -O2 requirements improving.  3.  Pulmonary embolism -CT angiogram chest from 06/30/2020 with an area suspicious for segmental thrombus in the left lower lobe associated airspace disease and probably likely pulmonary infarct. -Images concerning for right heart strain. -2D echo done was concerning for McConnell sign. -Lower extremity Dopplers negative for DVT. -Continue Xarelto.    -Outpatient follow-up.     4.  AKI with unknown baseline resolved/AG MA/lactic acidosis resolved/contraction alkalosis -Renal function improved. -Diamox discontinued. -Avoid nephrotoxic agents.  5.  Hypokalemia -Secondary to diuresis.   -Potassium at 3.5.   -Continue oral daily potassium supplementation of 40 mEq daily.   -May need to increase  potassium supplementation to 60 mEq daily or 80 mEq daily.   -Repeat labs in the morning.  6.  Demand ischemia -Troponin noted to be downtrending with oxygenation. -2D echo with EF of 55  to 60%, NWMA, McConnell sign with moderately enlarged RV intraventricular septal flattening concerning for RV overload (likely secondary to OHS). -Suspected left lower lobe segmental PE as above. -Continue Xarelto. -Continue Demadex. -Seen by cardiology and patient may benefit from outpatient right heart cath. -We will need outpatient follow-up with cardiology  7.  Transaminitis -?  Etiology. -Concern for possible shock liver. -Acute hepatitis panel negative. -Right upper quadrant ultrasound with cholelithiasis and negative for cholecystitis. -LFTs have trended down and transaminitis resolved.  8.  Hypoglycemia -Patient noted on admission to have glucose of 40s. -Hypoglycemia resolved with dextrose infusion on admission.   -CBG 107 this morning.   -Follow.   9.  Diarrhea, resolving -Likely in the setting of tube feeds and bowel regimen.  Improved.  10.  Normocytic anemia/anemia of chronic disease -Hemoglobin stable at 10.9.  -Transfusion threshold hemoglobin < 7.  11.  Social barriers -patient from [place] and uninsured; her wish is to return to [place] to [place] belongings and move to [place] as she has family here, however she has nowhere here to live yet if she moved - not sure if family would take her in after this hospitalization but this has been brought up to them and it didn't appear to be an option - if she's needing nightly BIPAP, that will be a factor; and/or if she needs continuous O2 that will also be a factor. -Oxygen requirements improving. -Mobility is improving.  PT following.  Patient noted to be out of bed and ambulated in her room.  - until these issues are fixed and/or addressed, she will remain in the hospital a prolonged amount of time -TOC consulted to help with disposition.  12.  Morbid obesity -Patient advised of weight loss.    DVT prophylaxis: Xarelto Code Status: Full Family Communication: Updated patient.  No family at bedside. Disposition:    Status is: Inpatient    Dispo: The patient is from: Home              Anticipated d/c is to: TBD              Patient currently on BiPAP nightly.  Medically stable for discharge.  Currently unsafe disposition.  TOC consulted to help with disposition.   Difficult to place patient yes       Consultants:   Infectious disease: Dr., 06/30/2020  Cardiology: Dr. Mayford Knife 06/27/2020  Pulmonary/PCCM admission  Procedures:   CT angiogram chest 06/30/2020  CT head 06/25/2020  Abdominal ultrasound 06/26/2020  Renal ultrasound 06/26/2020  2D echo 07/11/2020  Lower extremity Dopplers 06/27/2020  Antimicrobials:  Anti-infectives (From admission, onward)   Start     Dose/Rate Route Frequency Ordered Stop   06/26/20 2000  cefTRIAXone (ROCEPHIN) 2 g in sodium chloride 0.9 % 100 mL IVPB  Status:  Discontinued        2 g 200 mL/hr over 30 Minutes Intravenous Every 24 hours 06/26/20 1818 07/01/20 1552   06/25/20 1900  Ampicillin-Sulbactam (UNASYN) 3 g in sodium chloride 0.9 % 100 mL IVPB  Status:  Discontinued        3 g 200 mL/hr over 30 Minutes Intravenous Every 12 hours 06/25/20 1857 06/26/20 1818   06/25/20 1700  cefTRIAXone (ROCEPHIN) 1 g in sodium chloride 0.9 % 100 mL IVPB  1 g 200 mL/hr over 30 Minutes Intravenous  Once 06/25/20 1649 06/25/20 1859   06/25/20 1700  azithromycin (ZITHROMAX) 500 mg in sodium chloride 0.9 % 250 mL IVPB        500 mg 250 mL/hr over 60 Minutes Intravenous  Once 06/25/20 1649 06/25/20 2005       Subjective: Sitting up in bed.  Denies chest pain, no shortness of breath.  Overall feeling better.  Stated ambulated in the room and feeling stronger.  States now requiring BiPAP only at bedtime.     Objective: Vitals:   07/21/20 0423 07/21/20 0428 07/21/20 0500 07/21/20 1353  BP:  112/62  130/72  Pulse: 77 73  95  Resp: 18 20  17   Temp:  98.6 F (37 C)  98.5 F (36.9 C)  TempSrc:  Oral  Oral  SpO2: 95% 94%  92%  Weight:   (!) 224.4 kg   Height:         Intake/Output Summary (Last 24 hours) at 07/21/2020 2007 Last data filed at 07/21/2020 07/23/2020 Gross per 24 hour  Intake 120 ml  Output 1100 ml  Net -980 ml   Filed Weights   07/16/20 0500 07/20/20 0500 07/21/20 0500  Weight: (!) 218.3 kg (!) 214.1 kg (!) 224.4 kg    Examination:  General exam: : NAD.  Morbidly obese Respiratory system: CTA B.  No wheezes, no rhonchi.  Speaking in full sentences.  Normal respiratory effort. Cardiovascular system: Regular rate and rhythm no murmurs rubs or gallops.  No JVD.  No lower extremity edema.  Gastrointestinal system: Abdomen obese, soft, nontender, nondistended, positive bowel sounds.  No rebound.  No guarding. Central nervous system: Alert and oriented. No focal neurological deficits. Extremities: Symmetric 5 x 5 power. Skin: No rashes, lesions or ulcers Psychiatry: Judgement and insight appear normal. Mood & affect appropriate.  Data Reviewed: I have personally reviewed following labs and imaging studies  CBC: Recent Labs  Lab 07/15/20 0404 07/16/20 0406 07/17/20 0357  WBC 5.4 5.1 5.0  NEUTROABS 3.6 3.5  --   HGB 10.2* 10.3* 10.9*  HCT 37.0 37.2 37.8  MCV 85.8 85.3 83.6  PLT 287 277 295    Basic Metabolic Panel: Recent Labs  Lab 07/15/20 0404 07/16/20 0406 07/17/20 0357 07/18/20 0423 07/19/20 0217 07/20/20 0405 07/21/20 0402  NA 139 139 140 139 138 139 138  K 3.9 3.1* 3.3* 3.8 4.7 3.8 3.5  CL 102 99 96* 97* 97* 96* 95*  CO2 29 31 35* 33* 31 36* 36*  GLUCOSE 98 95 96 93 84 104* 92  BUN 11 12 12 12 11 13 14   CREATININE 0.82 0.84 0.76 0.79 0.90 1.07* 0.92  CALCIUM 8.5* 8.5* 8.7* 8.8* 8.8* 8.7* 8.6*  MG 2.2 1.9 1.9  --   --  2.1  --   PHOS 4.1 4.5  --   --   --   --   --     GFR: Estimated Creatinine Clearance: 143.1 mL/min (by C-G formula based on SCr of 0.92 mg/dL).  Liver Function Tests: Recent Labs  Lab 07/15/20 0404 07/16/20 0406  AST 32 27  ALT 21 24  ALKPHOS 56 55  BILITOT 0.6 0.6  PROT 6.5 6.5   ALBUMIN 2.7* 2.9*    CBG: Recent Labs  Lab 07/20/20 1145 07/20/20 1750 07/21/20 0840 07/21/20 1142 07/21/20 1701  GLUCAP 109* 122* 107* 121* 125*     No results found for this or any previous visit (from the  past 240 hour(s)).       Radiology Studies: No results found.      Scheduled Meds: . (feeding supplement) PROSource Plus  30 mL Oral TID BM  . magnesium oxide  800 mg Oral BID  . mouth rinse  15 mL Mouth Rinse BID  . multivitamin with minerals  1 tablet Oral Daily  . pantoprazole  40 mg Oral QHS  . potassium chloride  40 mEq Oral Daily  . Ensure Max Protein  11 oz Oral BID  . [START ON 07/22/2020] rivaroxaban  20 mg Oral Q supper  . torsemide  40 mg Oral BID   Continuous Infusions:   LOS: 26 days    Time spent: 35 minutes    Ramiro Harvest, MD [facility]   To contact the attending provider between 7A-7P or the covering provider during after hours 7P-7A, please log into the web site [email or web address] and access using universal [place] password for that web site. If you do not have the password, please call the hospital operator.  07/21/2020, 8:07 PM

## 2020-07-21 NOTE — Progress Notes (Signed)
  Speech Language Pathology Treatment: Dysphagia  Patient Details Name: Felicia Warren MRN: [id number] DOB: October 04, 1970 Today's Date: 07/21/2020 Time: 0947-0962 SLP Time Calculation (min) (ACUTE ONLY): 15 min  Assessment / Plan / Recommendation Clinical Impression  Pt seen for follow up regarding swallow function since advancement in diet to regular consistency on 5/29.  Pt reports good tolerance of po diet without coughing.  This SLP last saw pt on 5/16, at which time was had continued to require HFNC and at this time she is on room air.  SLP reviewed again with pt importance of eating only when not dyspneic using example of apnea with swallowing.  Pt's respiratory status is much improved currently and voice is reported to be normal.  Reviewed eating/drinking with respiratory issues handout using teach back.  Pt admits to difficulty swallowing some medications and states she takes some medicine crushed with liquids.  Advised she continue as tolerated and reviewed other alternatives for medication administration.  3 ounce Yale water test passed again and SLP reviewed with pt it's reasoning.  Pt also denies reflux despite belcing noted post=swallow - but she is on a PPI.  No SlP follow up indicated, continue current diet.       HPI HPI: 50 y/o Female who presented as a Felicia Warren, found with altered mental status in a motel by staff.  It is unknown when her last known well was.  EMS found patient awake, but altered glucose was initially 40.  This was corrected, but she continued to have worsening mental status.  Narcan did not provide any response.  In ED she required nonrebreather mask and eventual intubation for hypoxic respiratory failure.  Chest x-ray with probable multifocal pneumonia; previously seen by ST on 07/03/20 and s/o with Dysphagia 3/thin liquid diet; re-consulted to upgrade diet as able.      SLP Plan  All goals met       Recommendations  Diet recommendations: Regular;Thin  liquid Liquids provided via: Cup;Straw Medication Administration: Whole meds with liquid Supervision: Patient able to self feed Compensations: Slow rate;Small sips/bites Postural Changes and/or Swallow Maneuvers: Seated upright 90 degrees;Upright 30-60 min after meal                Oral Care Recommendations: Oral care BID Follow up Recommendations: Skilled Nursing facility SLP Visit Diagnosis: Dysphagia, unspecified (R13.10) Plan: All goals met       GO                Felicia Warren 07/21/2020, 10:06 PM  07/21/2020 Felicia Lime, MS [place] Office [phone] Pager [phone]

## 2020-07-21 NOTE — Progress Notes (Signed)
PT Cancellation Note  Patient Details Name: Chemeka Filice MRN: [id number] DOB: [birth year]   Cancelled Treatment:    Reason Eval/Treat Not Completed: Other (comment)patient reports that she just ambulated in room, has been up several times ad lib. RN confirms.  will check back and attempt increased distance.   [name] 07/21/2020, 1:49 PM  [name] PT Acute Rehabilitation Services Pager [phone] Office [phone]

## 2020-07-22 LAB — BASIC METABOLIC PANEL
Anion gap: 10 (ref 5–15)
BUN: 16 mg/dL (ref 6–20)
CO2: 31 mmol/L (ref 22–32)
Calcium: 8.6 mg/dL — ABNORMAL LOW (ref 8.9–10.3)
Chloride: 96 mmol/L — ABNORMAL LOW (ref 98–111)
Creatinine, Ser: 1.01 mg/dL — ABNORMAL HIGH (ref 0.44–1.00)
GFR, Estimated: >60 mL/min (ref >60)
Glucose, Bld: 99 mg/dL (ref 70–99)
Potassium: 3.8 mmol/L (ref 3.5–5.1)
Sodium: 137 mmol/L (ref 135–145)

## 2020-07-22 LAB — GLUCOSE, CAPILLARY
Glucose-Capillary: 103 mg/dL — ABNORMAL HIGH (ref 70–99)
Glucose-Capillary: 100 mg/dL — ABNORMAL HIGH (ref 70–99)
Glucose-Capillary: 94 mg/dL (ref 70–99)
Glucose-Capillary: 117 mg/dL — ABNORMAL HIGH (ref 70–99)

## 2020-07-22 LAB — CBC
HCT: 39.6 % (ref 36.0–46.0)
Hemoglobin: 11.2 g/dL — ABNORMAL LOW (ref 12.0–15.0)
MCH: 23.9 pg — ABNORMAL LOW (ref 26.0–34.0)
MCHC: 28.3 g/dL — ABNORMAL LOW (ref 30.0–36.0)
MCV: 84.4 fL (ref 80.0–100.0)
Platelets: 285 10*3/uL (ref 150–400)
RBC: 4.69 MIL/uL (ref 3.87–5.11)
RDW: 19.3 % — ABNORMAL HIGH (ref 11.5–15.5)
WBC: 6.8 10*3/uL (ref 4.0–10.5)
nRBC: 0 % (ref 0.0–0.2)

## 2020-07-22 LAB — MAGNESIUM: Magnesium: 2 mg/dL (ref 1.7–2.4)

## 2020-07-22 MED ORDER — ENSURE MAX PROTEIN PO LIQD
11.0000 [oz_av] | Freq: Every day | ORAL | Status: DC
  Administered 2020-07-24: 11 [oz_av] via ORAL
  Filled 2020-07-22 (×3): qty 330

## 2020-07-22 NOTE — TOC Progression Note (Signed)
Transition of Care [facility]) - Progression Note    Patient Details  Name: Felicia Warren MRN: [id number] Date of Birth: 1970-10-23  Transition of Care [facility]) CM/SW Contact  Darleene Cleaver, [place] Phone Number: 07/22/2020, 10:41 AM  Clinical Narrative:    Per leadership patient has been approved for a 2 week LOG.  Patient has been faxed out awaiting bed offers.   Expected Discharge Plan: Homeless Shelter Barriers to Discharge: Continued Medical Work up  Expected Discharge Plan and Services Expected Discharge Plan: Homeless Shelter       Living arrangements for the past 2 months: Hotel/Motel                                       Social Determinants of Health (SDOH) Interventions    Readmission Risk Interventions No flowsheet data found.

## 2020-07-22 NOTE — [place] FL2 (Signed)
[place] MEDICAID FL2 LEVEL OF CARE SCREENING TOOL     IDENTIFICATION  Patient Name: Felicia Warren Birthdate: 1970-03-15 Sex: female Admission Date (Current Location): 06/25/2020  [facility] and [place] Number:  Lobbyist and Address:  [facility],  [address], [place] [address]      Provider Number: [id number]  Attending Physician Name and Address:  Joseph Art, DO  Relative Name and Phone Number:  Filiberto Pinks   [phone]    Current Level of Care: Hospital Recommended Level of Care: Skilled Nursing Facility Prior Approval Number:    Date Approved/Denied:   PASRR Number: Pending  Discharge Plan: SNF    Current Diagnoses: Patient Active Problem List   Diagnosis Date Noted  . Transaminitis   . Hypokalemia   . Hypoglycemia   . Acute metabolic encephalopathy 07/05/2020  . Pulmonary emboli (HCC) 07/05/2020  . Elevated LFTs   . Hypercapnic respiratory failure (HCC) 06/25/2020  . Acute respiratory failure with hypoxia and hypercarbia (HCC)   . Community acquired pneumonia     Orientation RESPIRATION BLADDER Height & Weight     Self,Time,Situation,Place  Normal Continent Weight: (!) 395 lb 8.1 oz (179.4 kg) Height:  5\' 6"  (167.6 cm)  BEHAVIORAL SYMPTOMS/MOOD NEUROLOGICAL BOWEL NUTRITION STATUS      Continent Diet (Carb Modified)  AMBULATORY STATUS COMMUNICATION OF NEEDS Skin   Limited Assist Verbally Skin abrasions                       Personal Care Assistance Level of Assistance  Bathing,Feeding,Dressing Bathing Assistance: Limited assistance Feeding assistance: Independent Dressing Assistance: Limited assistance     Functional Limitations Info  Sight,Hearing,Speech Sight Info: Adequate Hearing Info: Adequate Speech Info: Adequate    SPECIAL CARE FACTORS FREQUENCY  PT (By licensed PT),OT (By licensed OT)     PT Frequency: Minimum 5x a week OT Frequency: Minimum 5x a week             Contractures Contractures Info: Not present    Additional Factors Info  Code Status,Allergies Code Status Info: Full Code Allergies Info: NKA           Current Medications (07/22/2020):  This is the current hospital active medication list Current Facility-Administered Medications  Medication Dose Route Frequency Provider Last Rate Last Admin  . (feeding supplement) PROSource Plus liquid 30 mL  30 mL Oral TID BM 07/24/2020, MD   30 mL at 07/22/20 0916  . docusate sodium (COLACE) capsule 100 mg  100 mg Oral BID PRN Gleason, 07/24/20, PA-C      . hydrocortisone cream 1 %   Topical TID PRN Darcella Gasman, MD   Given at 07/11/20 [phone]  . ipratropium-albuterol (DUONEB) 0.5-2.5 (3) MG/3ML nebulizer solution 3 mL  3 mL Nebulization Q6H PRN 4315, MD      . lip balm (CARMEX) ointment   Topical PRN Lewie Chamber, MD   Given at 07/06/20 1717  . magnesium oxide (MAG-OX) tablet 800 mg  800 mg Oral BID 07/08/20, MD   800 mg at 07/22/20 0915  . MEDLINE mouth rinse  15 mL Mouth Rinse BID 07/24/20, MD   15 mL at 07/22/20 07/24/20  . multivitamin with minerals tablet 1 tablet  1 tablet Oral Daily 4008, MD   1 tablet at 07/22/20 0915  . pantoprazole (PROTONIX) EC tablet 40 mg  40 mg Oral QHS 07/24/20, MD   40 mg at  07/21/20 2143  . polyethylene glycol (MIRALAX / GLYCOLAX) packet 17 g  17 g Oral Daily PRN Gleason, Darcella Gasman, PA-C      . potassium chloride SA (KLOR-CON) CR tablet 40 mEq  40 mEq Oral Daily Rodolph Bong, MD   40 mEq at 07/22/20 0915  . protein supplement (ENSURE MAX) liquid  11 oz Oral BID Lewie Chamber, MD   11 oz at 07/19/20 1620  . rivaroxaban (XARELTO) tablet 20 mg  20 mg Oral Q supper Lucia Gaskins, RPH      . torsemide (DEMADEX) tablet 40 mg  40 mg Oral BID Zigmund Daniel., MD   40 mg at 07/22/20 0915     Discharge Medications: Please see discharge summary for a list of discharge medications.  Relevant Imaging Results:  Relevant  Lab Results:   Additional Information SSN [id number]  Darleene Cleaver, LCSW

## 2020-07-22 NOTE — Progress Notes (Signed)
Nutrition Follow-up  DOCUMENTATION CODES:   Morbid obesity  INTERVENTION:  - continue 30 ml Prosource Plus TID, each supplement provides 100 kcal and 15 grams protein. - will decrease Ensure Max from BID to once/day, each supplement provides 150 kcal and 30 grams protein.   NUTRITION DIAGNOSIS:   Increased nutrient needs related to acute illness as evidenced by estimated needs. -ongoing  GOAL:   Patient will meet greater than or equal to 90% of their needs -met  MONITOR:   PO intake,Supplement acceptance,Labs,Weight trends,Skin  ASSESSMENT:   50 year-old female who presented as a Felicia Warren, found with AMS in a motel by staff.  It is unknown when her last known well was.  EMS found patient awake by CBG of 40 mg/dl.  This was corrected, but she continued to have worsening mental status; Narcan did not provide any response.  In ED she required NRB mask and eventual intubation for hypoxic respiratory failure.  CXR with probable multifocal PNA.  Recently documented meal intakes: 5/28- 75% of breakfast and lunch, 80% of dinner (total of 1540 kcal and 75 grams protein) 5/29- 100% of all meals (total of 1668 kcal and 70 grams protein) 5/31- 100% of breakfast (609 kcal and 24 grams protein)  She has been accepting Prosource 75% of the time offered and Ensure Max nearly 100% of the time offered, although RN note in order states that patient reported that she receives Ensure Max too frequently.   Patient out of bed to the bathroom with RN. Notes indicate patient has been walking ad lib and continues to work with PT. OT saw patient today and note indicates she is making great progress toward goals.   [place] FL2 has been entered in [facility] note today.   Recently documented weights: 5/25- 481 lb 5/29- 472 lb 5/30- 495 lb 5/31- 395.5 lb  Question today's weight as it is the lowest since PTA and is far below all other weights recorded since admission. Patient is not longer receiving BID lasix and  non-pitting edema to all extremities documented in the edema section of flow sheet.      Labs reviewed; CBGs: 103 and 100 mg/dl, Cl: 96 mmol/l, creatinine: 1.01 mg/dl, Ca: 8.6 mg/dl. Medications reviewed; 800 mg mag-ox BID, 1 tablet multivitamin with minerals/day, 40 mg oral protonix/day, 40 mEq Klor-Con/day.   Diet Order:   Diet Order            Diet Heart Room service appropriate? Yes; Fluid consistency: Thin  Diet effective now                 EDUCATION NEEDS:   Education needs have been addressed  Skin:  Skin Assessment: Skin Integrity Issues: Skin Integrity Issues:: Other (Comment) Other: MASD + MARS to bilateral lumbar area  Last BM:  5/31 (type 5 x1)  Height:   Ht Readings from Last 1 Encounters:  06/25/20 '5\' 6"'  (1.676 m)    Weight:   Wt Readings from Last 1 Encounters:  07/22/20 (!) 179.4 kg     Estimated Nutritional Needs:  Kcal:  1775-2070 kcal (30-35 kcal/kg IBW) Protein:  115-130 grams Fluid:  >/= 1.5 L/day      Jarome Matin, MS, RD, LDN, CNSC Inpatient Clinical Dietitian RD pager # available in [place]  After hours/weekend pager # available in [facility]

## 2020-07-22 NOTE — TOC Progression Note (Signed)
Transition of Care [facility]) - Progression Note    Patient Details  Name: Felicia Warren MRN: [id number] Date of Birth: 1970/11/30  Transition of [facility]) CM/SW Contact  Armanda Heritage, RN Phone Number: 07/22/2020, 3:23 PM  Clinical Narrative:    [facility] notified patient will need Trilogy for discharge.    Expected Discharge Plan: Homeless Shelter Barriers to Discharge: Continued Medical Work up  Expected Discharge Plan and Services Expected Discharge Plan: Homeless Shelter       Living arrangements for the past 2 months: Hotel/Motel                                       Social Determinants of Health (SDOH) Interventions    Readmission Risk Interventions No flowsheet data found.

## 2020-07-22 NOTE — Plan of Care (Signed)
  Problem: Health Behavior/Discharge Planning: Goal: Ability to manage health-related needs will improve Outcome: Progressing   Problem: Clinical Measurements: Goal: Diagnostic test results will improve Outcome: Progressing Goal: Respiratory complications will improve Outcome: Progressing   Problem: Activity: Goal: Risk for activity intolerance will decrease Outcome: Progressing   Problem: Nutrition: Goal: Adequate nutrition will be maintained Outcome: Adequate for Discharge   Problem: Coping: Goal: Level of anxiety will decrease Outcome: Adequate for Discharge   Problem: Pain Managment: Goal: General experience of comfort will improve Outcome: Adequate for Discharge

## 2020-07-22 NOTE — Progress Notes (Signed)
Occupational Therapy Treatment Patient Details Name: Felicia Warren MRN: [id number] DOB: 1970/08/29 Today's Date: 07/22/2020    History of present illness Patient is a 50 year old female admitted with hypoglycemia and AMS, needing intubation on 5/4 and extubated on 5/8. Unsure of past medical history, patient currently living in [place] plans to move to [place] to be closer to family.   OT comments  Patient making great progress toward goals. Demonstrates increased cognition with score of 0/28 on SBT this date. Therapy session with focus on household mobility and education on AE to maximize safety/independence with LB ADLs. Patient expressed verbal understanding. Patient would benefit from reacher and long-handled sponge. OT will continue to follow acutely.    Follow Up Recommendations  Home health OT;Supervision - Intermittent    Equipment Recommendations  3 in 1 bedside commode (bariatric)    Recommendations for Other Services      Precautions / Restrictions Precautions Precautions: Fall Precaution Comments: watch HR Restrictions Weight Bearing Restrictions: No       Mobility Bed Mobility Overal bed mobility: Needs Assistance Bed Mobility: Supine to Sit;Sit to Supine     Supine to sit: Supervision Sit to supine: Supervision   General bed mobility comments: Supervision A for safety.    Transfers Overall transfer level: Needs assistance Equipment used: None Transfers: Sit to/from Stand Sit to Stand: Supervision Stand pivot transfers: Supervision       General transfer comment: no support within room/bathroom    Balance Overall balance assessment: Needs assistance Sitting-balance support: Feet unsupported Sitting balance-Leahy Scale: Good     Standing balance support: Single extremity supported Standing balance-Leahy Scale: Good Standing balance comment: Lateral LOB with mobility to bathroom. Patient able to self correct.                            ADL either performed or assessed with clinical judgement   ADL Overall ADL's : Needs assistance/impaired     Grooming: Wash/dry hands;Supervision/safety;Standing Grooming Details (indicate cue type and reason): Able to wash hands standing at sink in bathroom with supervision A for safety.                 Toilet Transfer: Radiographer, therapeutic Details (indicate cue type and reason): Simulated with transfer to EOB.         Functional mobility during ADLs: Supervision/safety       Vision       Perception     Praxis      Cognition Arousal/Alertness: Awake/alert Behavior During Therapy: WFL for tasks assessed/performed Overall Cognitive Status: Within Functional Limits for tasks assessed                                          Exercises     Shoulder Instructions       General Comments SBT administered this date. Patient with score of 0/28 indicating normal cognition.    Pertinent Vitals/ Pain       Pain Assessment: No/denies pain  Home Living                                          Prior Functioning/Environment              Frequency  Min  2X/week        Progress Toward Goals  OT Goals(current goals can now be found in the care plan section)  Progress towards OT goals: Progressing toward goals  Acute Rehab OT Goals Patient Stated Goal: get back to usual routine (would not elaborate) OT Goal Formulation: With patient Time For Goal Achievement: 07/29/20 Potential to Achieve Goals: Good ADL Goals Pt Will Perform Grooming: with modified independence;standing Pt Will Perform Upper Body Bathing: with supervision;sitting Pt Will Perform Lower Body Dressing: with modified independence;with adaptive equipment;sit to/from stand Pt Will Transfer to Toilet: with modified independence;ambulating;bedside commode Pt/caregiver will Perform Home Exercise Program: Increased strength;Both right and left  upper extremity;With Supervision  Plan Discharge plan remains appropriate;Frequency remains appropriate    Co-evaluation                 AM-PAC OT "6 Clicks" Daily Activity     Outcome Measure   Help from another person eating meals?: None Help from another person taking care of personal grooming?: A Little Help from another person toileting, which includes using toliet, bedpan, or urinal?: A Little Help from another person bathing (including washing, rinsing, drying)?: A Little Help from another person to put on and taking off regular upper body clothing?: None Help from another person to put on and taking off regular lower body clothing?: A Little 6 Click Score: 20    End of Session    OT Visit Diagnosis: Other abnormalities of gait and mobility (R26.89);Muscle weakness (generalized) (M62.81);Other symptoms and signs involving cognitive function   Activity Tolerance Patient tolerated treatment well   Patient Left in bed;with call bell/phone within reach   Nurse Communication Mobility status        Time: 3154-0086 OT Time Calculation (min): 15 min  Charges: OT General Charges $OT Visit: 1 Visit OT Treatments $Self Care/Home Management : 8-22 mins  Chinedu Agustin H. OTR/L Supplemental OT, Department of rehab services [phone]   Isador Castille R H. 07/22/2020, 11:51 AM

## 2020-07-22 NOTE — Progress Notes (Signed)
PROGRESS NOTE    Felicia Warren  IPJ:[id number] DOB: December 21, 1970 DOA: 06/25/2020 PCP: Pcp, No  -- from [place]   Chief Complaint  Patient presents with  . unresponsive    Brief Narrative:  50 yo F who presented initially as a [name], found with altered mental status in a motel by staff. It is unknown when her last known well was. EMS found patient awake, but altered with [id number]. This was corrected, but she continued to have worsening mental status. Narcan did not provide any response. Eventual intubation for hypoxic respiratory failure. Chest x-ray with probable multifocal pneumonia. AKI, elevated INR on admit. No prior history in epicas she is from [facility] no known emergency contact.Admitted by PCCM given requirement for airway protection/intubation.   5/4 presented to ED with hypoglycemia and AMS, required intubation, PCCM consult. CXR concerning for PNA. Unasyn initiated. Tx to [facility] for beds. UDS neg, neg tylenol/ salicylates. CTH neg. Lactic acid 5.9->4.9  5/5 Hypotensive, responded to IVF + albumin, on heparin gtt but stopped due to platelet decline, not on pressors, requiring PEEP 10 / FiO2 80. RUQ [place] with cholelithiasis but no cholecystitis.   5/6 Vent needs decreased. LE venous duplex negative.   5/7 diarrhea > Placed flexiseal 5/8  5/8 WUA/SBT, extubated  Discharge is complicated as patient out of state with bipap dependence.  No insurance.  SW looking into adult medicaid.   Assessment & Plan:   Principal Problem:   Acute respiratory failure with hypoxia and hypercarbia (HCC) Active Problems:   Hypercapnic respiratory failure (HCC)   Elevated LFTs   Acute metabolic encephalopathy   Pulmonary emboli (HCC)   Transaminitis   Hypokalemia   Hypoglycemia   acute on chronic hypoxic hypercarbic respiratory failure/probable OSA/OHS -Patient now needing BiPAP nightly at present. -Goal sats 88 to 92% -Patient was on Lasix and has been transitioned  to oral torsemide -Metabolic acidosis has resolved.  Diamox discontinued.. -It is noted that seal on BiPAP also remain subpar at times. -Will need outpatient sleep study.   -Will need outpatient follow-up with pulmonary.   Acute metabolic encephalopathy -Secondary to chronic CO2 retention.  -Clinical improvement. -VBG was noted to have compensated respiratory acidosis. -Likely needs outpatient sleep study as patient would likely probable OHS and OSA. -Currently tolerating BiPAP nightly only.   Pulmonary embolism -CT angiogram chest from 06/30/2020 with an area suspicious for segmental thrombus in the left lower lobe associated airspace disease and probably likely pulmonary infarct. -Images concerning for right heart strain. -2D echo done was concerning for McConnell sign. -Lower extremity Dopplers negative for DVT. -Continue Xarelto.    -Outpatient follow-up.     AKI with unknown baseline resolved/AG MA/lactic acidosis resolved/contraction alkalosis -Renal function improved. -Diamox discontinued. -Avoid nephrotoxic agents.    Hypokalemia -Secondary to diuresis.   -Continue oral daily potassium supplementation of 40 mEq daily.   -May need to increase potassium supplementation to 60 mEq daily or 80 mEq daily.   -daily labs  Demand ischemia -Troponin noted to be downtrending with oxygenation. -2D echo with EF of 55 to 60%, NWMA, McConnell sign with moderately enlarged RV intraventricular septal flattening concerning for RV overload (likely secondary to OHS). -Suspected left lower lobe segmental PE as above. -Continue Xarelto. -Continue Demadex. -Seen by cardiology and patient may benefit from outpatient right heart cath. -We will need outpatient follow-up with cardiology  Resolved Transaminitis -?  Etiology. -Concern for possible shock liver. -Acute hepatitis panel negative. -Right upper quadrant ultrasound with cholelithiasis and negative for  cholecystitis. -LFTs have  trended down and transaminitis resolved.  Hypoglycemia -Patient noted on admission to have glucose of 40s. -Hypoglycemia resolved with dextrose infusion on admission.    Diarrhea, resolved -Likely in the setting of tube feeds and bowel regimen  normocytic anemia/anemia of chronic disease -Transfusion threshold hemoglobin < 7.  Social barriers -patient from [place] and uninsured; her [place] is to return to [place] to [place] belongings and move to [place] as she has family here, however she has nowhere here to live yet if she moved - not sure if family would take her in after this hospitalization but this has been brought up to them and it didn't appear to be an option - if she's needing nightly BIPAP, that will be a factor; and/or if she needs continuous O2 that will also be a factor. -Mobility is improving.  PT following.  Patient noted to be out of bed and ambulated in her room.  - until these issues are fixed and/or addressed, she will remain in the hospital a prolonged amount of time -TOC consulted to help with disposition.   Morbid obesity -Estimated body mass index is 63.84 kg/m as calculated from the following:   Height as of this encounter: 5\' 6"  (1.676 m).   Weight as of this encounter: 179.4 kg.    DVT prophylaxis: Xarelto Code Status: Full Family Communication: Updated patient.  No family at bedside. Disposition:   Status is: Inpatient    Dispo: The patient is from: Home              Anticipated d/c is to: TBD              Patient currently on BiPAP nightly.  Medically stable for discharge once bipap/trilogy arranged along with place.  Currently unsafe disposition.  TOC consulted to help with disposition.   Difficult to place patient yes       Consultants:   Infectious disease  06/30/2020  Cardiology: Dr. 08/30/2020 06/27/2020  Pulmonary/PCCM admission  Procedures:   CT angiogram chest 06/30/2020  CT head 06/25/2020  Abdominal ultrasound 06/26/2020  Renal  ultrasound 06/26/2020  2D echo 07/11/2020  Lower extremity Dopplers 06/27/2020    Subjective: No current complaints   Objective: Vitals:   07/21/20 1353 07/21/20 2312 07/22/20 0500 07/22/20 0515  BP: 130/72 (!) 118/55  (!) 124/50  Pulse: 95 93  80  Resp: 17 14  16   Temp: 98.5 F (36.9 C) 98.9 F (37.2 C)  97.7 F (36.5 C)  TempSrc: Oral Oral  Oral  SpO2: 92% 93%    Weight:   (!) 179.4 kg   Height:        Intake/Output Summary (Last 24 hours) at 07/22/2020 0759 Last data filed at 07/21/2020 2200 Gross per 24 hour  Intake 120 ml  Output 200 ml  Net -80 ml   Filed Weights   07/20/20 0500 07/21/20 0500 07/22/20 0500  Weight: (!) 214.1 kg (!) 224.4 kg (!) 179.4 kg    Examination:  General: Appearance:    Severely obese female in no acute distress     Lungs:     Diminished, respirations unlabored  Heart:    Normal heart rate.    MS:   All extremities are intact.   Neurologic:   Awake, alert, poor insight into disease process   Data Reviewed: I have personally reviewed following labs and imaging studies  CBC: Recent Labs  Lab 07/16/20 0406 07/17/20 0357 07/22/20 0358  WBC 5.1 5.0 6.8  NEUTROABS 3.5  --   --   HGB 10.3* 10.9* 11.2*  HCT 37.2 37.8 39.6  MCV 85.3 83.6 84.4  PLT 277 295 285    Basic Metabolic Panel: Recent Labs  Lab 07/16/20 0406 07/17/20 0357 07/18/20 0423 07/19/20 0217 07/20/20 0405 07/21/20 0402 07/22/20 0358  NA 139 140 139 138 139 138 137  K 3.1* 3.3* 3.8 4.7 3.8 3.5 3.8  CL 99 96* 97* 97* 96* 95* 96*  CO2 31 35* 33* 31 36* 36* 31  GLUCOSE 95 96 93 84 104* 92 99  BUN 12 12 12 11 13 14 16   CREATININE 0.84 0.76 0.79 0.90 1.07* 0.92 1.01*  CALCIUM 8.5* 8.7* 8.8* 8.8* 8.7* 8.6* 8.6*  MG 1.9 1.9  --   --  2.1  --  2.0  PHOS 4.5  --   --   --   --   --   --     GFR: Estimated Creatinine Clearance: 111.6 mL/min (A) (by C-G formula based on SCr of 1.01 mg/dL (H)).  Liver Function Tests: Recent Labs  Lab 07/16/20 0406  AST 27   ALT 24  ALKPHOS 55  BILITOT 0.6  PROT 6.5  ALBUMIN 2.9*    CBG: Recent Labs  Lab 07/20/20 1750 07/21/20 0840 07/21/20 1142 07/21/20 1701 07/21/20 2015  GLUCAP 122* 107* 121* 125* 107*     No results found for this or any previous visit (from the past 240 hour(s)).       Radiology Studies: No results found.      Scheduled Meds: . (feeding supplement) PROSource Plus  30 mL Oral TID BM  . magnesium oxide  800 mg Oral BID  . mouth rinse  15 mL Mouth Rinse BID  . multivitamin with minerals  1 tablet Oral Daily  . pantoprazole  40 mg Oral QHS  . potassium chloride  40 mEq Oral Daily  . Ensure Max Protein  11 oz Oral BID  . rivaroxaban  20 mg Oral Q supper  . torsemide  40 mg Oral BID   Continuous Infusions:   LOS: 27 days    Time spent: 25 minutes    07/23/20, DO [facility]   To contact the attending provider between 7A-7P or the covering provider during after hours 7P-7A, please log into the web site [email or web address] and access using universal [place] password for that web site. If you do not have the password, please call the hospital operator.  07/22/2020, 7:59 AM

## 2020-07-22 NOTE — TOC Progression Note (Signed)
Transition of Care [facility]) - Progression Note    Patient Details  Name: Felicia Warren MRN: [id number] Date of Birth: [birth year]  Transition of Care [facility]) CM/SW Contact  Geni Bers, RN Phone Number: 07/22/2020, 4:07 PM  Clinical Narrative:    Spoke with pt concerning discharge plans with her going to a hotel, HHPT and Trilogy. PT will evaluate how far pt can walk. At present time pt is able to walk from bed to [facility].    Expected Discharge Plan: Homeless Shelter Barriers to Discharge: Continued Medical Work up  Expected Discharge Plan and Services Expected Discharge Plan: Homeless Shelter       Living arrangements for the past 2 months: Hotel/Motel                                       Social Determinants of Health (SDOH) Interventions    Readmission Risk Interventions No flowsheet data found.

## 2020-07-23 LAB — GLUCOSE, CAPILLARY
Glucose-Capillary: 98 mg/dL (ref 70–99)
Glucose-Capillary: 149 mg/dL — ABNORMAL HIGH (ref 70–99)
Glucose-Capillary: 115 mg/dL — ABNORMAL HIGH (ref 70–99)
Glucose-Capillary: 103 mg/dL — ABNORMAL HIGH (ref 70–99)

## 2020-07-23 NOTE — TOC Progression Note (Addendum)
Transition of Care [facility]) - Progression Note    Patient Details  Name: Leenah Seidner MRN: [id number] Date of Birth: Apr 14, 1970  Transition of Care [facility]) CM/SW Contact  Geni Bers, RN Phone Number: 07/23/2020, 2:26 PM  Clinical Narrative:    Frances Furbish will follow pt for HHPT. [employer] for DME. Plan to discharge is Friday 6/3. Pt is aware and pt's sister.    Expected Discharge Plan: Homeless Shelter Barriers to Discharge: Continued Medical Work up  Expected Discharge Plan and Services Expected Discharge Plan: Homeless Shelter       Living arrangements for the past 2 months: Hotel/Motel                                       Social Determinants of Health (SDOH) Interventions    Readmission Risk Interventions No flowsheet data found.

## 2020-07-23 NOTE — Progress Notes (Signed)
PROGRESS NOTE    Felicia Warren   JIR:[id number]  DOB: 11/14/1970  PCP: Pcp, No    DOA: 06/25/2020 LOS: 92   Brief Narrative   50 yo F who presented initially as a Felicia Warren, found with altered mental status in a motel by staff.  It is unknown when her last known well was.  EMS found patient awake, but altered with a glucose of 40.  This was corrected, but she continued to have worsening mental status.  Narcan did not provide any response. Eventual intubation for hypoxic respiratory failure.  Chest x-ray with probable multifocal pneumonia.  AKI, elevated INR on admit. No prior history in epic as she is from [place] and no known emergency contact. Admitted by PCCM given requirement for airway protection/intubation.    5/4 presented to ED with hypoglycemia and AMS, required intubation, PCCM consult. CXR concerning for PNA.  Unasyn initiated. Tx to [facility] for beds. UDS neg, neg tylenol/ salicylates. CTH neg. Lactic acid 5.9-> 4.9  5/5 Hypotensive, responded to IVF + albumin, on heparin gtt but stopped due to platelet decline, not on pressors, requiring PEEP 10 / FiO2 80. RUQ [place] with cholelithiasis but no cholecystitis.    5/6 Vent needs decreased. LE venous duplex negative.   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  No insurance.  SW looking into adult medicaid.    Assessment & Plan   Principal Problem:   Acute respiratory failure with hypoxia and hypercarbia (HCC) Active Problems:   Hypercapnic respiratory failure (HCC)   Elevated LFTs   Acute metabolic encephalopathy   Pulmonary emboli (HCC)   Transaminitis   Hypokalemia   Hypoglycemia   Acute on chronic hypoxic hypercarbic respiratory failure/probable OSA/OHS -Patient now needing BiPAP nightly  -Goal sats 88 to 92% -Treated with Lasix, transitioned to PO torsemide -Metabolic acidosis has resolved.  Diamox discontinued.. -It is noted  that seal on BiPAP also remain subpar at times. -Will need outpatient sleep study.   -Will need outpatient follow-up with pulmonary.   Acute metabolic encephalopathy -Secondary to chronic CO2 retention.  -Clinical improvement. -VBG was noted to have compensated respiratory acidosis. -Likely needs outpatient sleep study as patient would likely probable OHS and OSA. -Currently tolerating BiPAP nightly only.   Pulmonary embolism -CT angiogram chest from 06/30/2020 with an area suspicious for segmental thrombus in the left lower lobe associated airspace disease and probably likely pulmonary infarct. -Images concerning for right heart strain. -2D echo done was concerning for McConnell sign. -Lower extremity Dopplers negative for DVT. -Continue Xarelto.    -Outpatient follow-up.     AKI with unknown baseline resolved/AG MA/lactic acidosis resolved/contraction alkalosis -Renal function improved. -Diamox discontinued. -Avoid nephrotoxic agents.    Hypokalemia -Secondary to diuresis.   -Continue oral daily potassium supplementation of 40 mEq daily.   -May need to increase potassium supplementation to 60 mEq daily or 80 mEq daily.   -daily labs  Demand ischemia -Troponin noted to be downtrending with oxygenation. -2D echo with EF of 55 to 60%, NWMA, McConnell sign with moderately enlarged RV intraventricular septal flattening concerning for RV overload (likely secondary to OHS). -Suspected left lower lobe segmental PE as above. -Continue Xarelto. -Continue Demadex. -Seen by cardiology and patient may benefit from outpatient right heart cath. -We will need outpatient follow-up with cardiology  Resolved Transaminitis -?  Etiology. -Concern for possible shock liver. -Acute hepatitis panel negative. -Right upper quadrant  ultrasound with cholelithiasis and negative for cholecystitis. -LFTs have trended down and transaminitis resolved.  Hypoglycemia -Patient noted on admission to  have glucose of 40s. -Hypoglycemia resolved with dextrose infusion on admission.    Diarrhea, resolved -Likely in the setting of tube feeds and bowel regimen  normocytic anemia/anemia of chronic disease -Transfusion threshold hemoglobin < 7.  Social barriers -patient from [place] and uninsured; [place] to [place] to [place] as she has family here, however she has nowhere here to live yet if she moved - not sure if family would take her in after this hospitalization but this has been brought up to them and it didn't appear to be an option - if she's needing nightly BIPAP, that will be a factor; and/or if she needs continuous O2 that will also be a factor. -Mobility is improving.  PT following.  Patient noted to be out of bed and ambulated in her room.  - until these issues are fixed and/or addressed, she will remain in the hospital a prolonged amount of time -TOC consulted to help with disposition.   Morbid obesity Patient BMI: Body mass index is 63.84 kg/m.  Complicates overall care and prognosis.  Recommend lifestyle modifications including physical activity and diet for weight loss and overall long-term health.   DVT prophylaxis: Place and maintain sequential compression device Start: 06/26/20 1150 SCDs Start: 06/25/20 1828 rivaroxaban (XARELTO) tablet 20 mg   Diet:  Diet Orders (From admission, onward)    Start     Ordered   07/20/20 1033  Diet Heart Room service appropriate? Yes; Fluid consistency: Thin  Diet effective now       Question Answer Comment  Room service appropriate? Yes   Fluid consistency: Thin      07/20/20 1033            Code Status: Full Code    Subjective 07/23/20    Pt reports little cough yesterday, better today.  Denies any other acute complaints or concerns at this time. No acute events reported.    Disposition Plan & Communication   Status is: Inpatient  Remains inpatient appropriate  because:Inpatient level of care appropriate due to severity of illness and Requires BiPAP/trilogy nightly and cannot be safely discharged until this has been obtained.  Unsafe discharge.  Otherwise medically stable   Dispo: The patient is from: Home              [facility]ticip[facility] d[facility]              Patient currently is medically stable to d/c.   Difficult to place patient Yes   Consults, Procedures, Significant Events   Consultants:   Infectious disease  Cardiology  Pulmonology/PCCM admission  Procedures:   CT angiogram chest 06/30/2020  CT head 06/25/2020  Abdominal ultrasound 06/26/2020  Renal ultrasound 06/26/2020  2D echo 07/11/2020  Lower extremity Dopplers 06/27/2020   Antimicrobials:  Anti-infectives (From admission, onward)   Start     Dose/Rate Route Frequency Ordered Stop   06/26/20 2000  cefTRIAXone (ROCEPHIN) 2 g in sodium chloride 0.9 % 100 mL IVPB  Status:  Discontinued        2 g 200 mL/hr over 30 Minutes Intravenous Every 24 hours 06/26/20 1818 07/01/20 1552   06/25/20 1900  Ampicillin-Sulbactam (UNASYN) 3 g in sodium chloride 0.9 % 100 mL IVPB  Status:  Discontinued        3 g 200 mL/hr over  30 Minutes Intravenous Every 12 hours 06/25/20 1857 06/26/20 1818   06/25/20 1700  cefTRIAXone (ROCEPHIN) 1 g in sodium chloride 0.9 % 100 mL IVPB        1 g 200 mL/hr over 30 Minutes Intravenous  Once 06/25/20 1649 06/25/20 1859   06/25/20 1700  azithromycin (ZITHROMAX) 500 mg in sodium chloride 0.9 % 250 mL IVPB        500 mg 250 mL/hr over 60 Minutes Intravenous  Once 06/25/20 1649 06/25/20 2005        Micro    Objective   Vitals:   07/22/20 1233 07/22/20 2340 07/23/20 0332 07/23/20 0558  BP: (!) 104/54   (!) 115/54  Pulse: 84 87 82 77  Resp: 20 (!) 23 (!) 24   Temp: 97.8 F (36.6 C)   98 F (36.7 C)  TempSrc: Oral   Oral  SpO2: 92% 95%  97%  Weight:      Height:        Intake/Output Summary (Last 24 hours) at 07/23/2020 0841 Last data filed at  07/22/2020 4332 Gross per 24 hour  Intake 240 ml  Output --  Net 240 ml   Filed Weights   07/20/20 0500 07/21/20 0500 07/22/20 0500  Weight: (!) 214.1 kg (!) 224.4 kg (!) 179.4 kg    Physical Exam:  General exam: awake, alert, no acute distress, morbidly obese Respiratory system: CTAB but diminished due to body habitus, no wheezes, rales or rhonchi, normal respiratory effort. Cardiovascular system: normal S1/S2, RRR,  Central nervous system: A&O x3. no gross focal neurologic deficits, normal speech Extremities: moves all, no edema, normal tone Skin: dry, intact, normal temperature Psychiatry: normal mood, congruent affect, judgement and insight appear normal  Labs   Data Reviewed: I have personally reviewed following labs and imaging studies  CBC: Recent Labs  Lab 07/17/20 0357 07/22/20 0358  WBC 5.0 6.8  HGB 10.9* 11.2*  HCT 37.8 39.6  MCV 83.6 84.4  PLT 295 285   Basic Metabolic Panel: Recent Labs  Lab 07/17/20 0357 07/18/20 0423 07/19/20 0217 07/20/20 0405 07/21/20 0402 07/22/20 0358  NA 140 139 138 139 138 137  K 3.3* 3.8 4.7 3.8 3.5 3.8  CL 96* 97* 97* 96* 95* 96*  CO2 35* 33* 31 36* 36* 31  GLUCOSE 96 93 84 104* 92 99  BUN 12 12 11 13 14 16   CREATININE 0.76 0.79 0.90 1.07* 0.92 1.01*  CALCIUM 8.7* 8.8* 8.8* 8.7* 8.6* 8.6*  MG 1.9  --   --  2.1  --  2.0   GFR: Estimated Creatinine Clearance: 111.6 mL/min (A) (by C-G formula based on SCr of 1.01 mg/dL (H)). Liver Function Tests: No results for input(s): AST, ALT, ALKPHOS, BILITOT, PROT, ALBUMIN in the last 168 hours. No results for input(s): LIPASE, AMYLASE in the last 168 hours. No results for input(s): AMMONIA in the last 168 hours. Coagulation Profile: No results for input(s): INR, PROTIME in the last 168 hours. Cardiac Enzymes: No results for input(s): CKTOTAL, CKMB, CKMBINDEX, TROPONINI in the last 168 hours. BNP (last 3 results) No results for input(s): PROBNP in the last 8760  hours. HbA1C: No results for input(s): HGBA1C in the last 72 hours. CBG: Recent Labs  Lab 07/22/20 0821 07/22/20 1217 07/22/20 1622 07/22/20 2201 07/23/20 0751  GLUCAP 103* 100* 94 117* 98   Lipid Profile: No results for input(s): CHOL, HDL, LDLCALC, TRIG, CHOLHDL, LDLDIRECT in the last 72 hours. Thyroid Function Tests: No results for  input(s): TSH, T4TOTAL, FREET4, T3FREE, THYROIDAB in the last 72 hours. Anemia Panel: No results for input(s): VITAMINB12, FOLATE, FERRITIN, TIBC, IRON, RETICCTPCT in the last 72 hours. Sepsis Labs: No results for input(s): PROCALCITON, LATICACIDVEN in the last 168 hours.  No results found for this or any previous visit (from the past 240 hour(s)).    Imaging Studies   No results found.   Medications   Scheduled Meds: . (feeding supplement) PROSource Plus  30 mL Oral TID BM  . magnesium oxide  800 mg Oral BID  . mouth rinse  15 mL Mouth Rinse BID  . multivitamin with minerals  1 tablet Oral Daily  . pantoprazole  40 mg Oral QHS  . potassium chloride  40 mEq Oral Daily  . Ensure Max Protein  11 oz Oral Daily  . rivaroxaban  20 mg Oral Q supper  . torsemide  40 mg Oral BID   Continuous Infusions:     LOS: 28 days    Time spent: 25 minutes    Pennie Banter, DO [facility]  07/23/2020, 8:41 AM      If 7PM-7AM, please contact night-coverage. How to contact the [facility] Attending or Consulting provider 7A - 7P or covering provider during after hours 7P -7A, for this patient?    1. Check the care team in [facility] [place] [facility] and look for a) attending/consulting [facility] provider listed and b) the [facility] team listed 2. Log into [email or web address] and use [place]'s universal password to access. If you do not have the password, please contact the hospital operator. 3. Locate the [facility] provider you are looking for under [facility] and page to a number that you can be directly reached. 4. If you still have difficulty reaching the provider, please  page the [facility] (Director on Call) for the Hospitalists listed on amion for assistance.

## 2020-07-23 NOTE — Hospital Course (Addendum)
50 yo F who presented initially as a Felicia Warren, found with altered mental status in a motel by staff.  It is unknown when her last known well was.  EMS found patient awake, but altered with a glucose of 40.  This was corrected, but she continued to have worsening mental status.  Narcan did not provide any response. Eventual intubation for hypoxic respiratory failure.  Chest x-ray with probable multifocal pneumonia.  AKI, elevated INR on admit. No prior history in epic as she is from [place] and no known emergency contact. Admitted by PCCM given requirement for airway protection/intubation.   5/4 presented to ED with hypoglycemia and AMS, required intubation, PCCM consult. CXR concerning for PNA.  Unasyn initiated. Tx to [facility] for beds. UDS neg, neg tylenol/ salicylates. CTH neg. Lactic acid 5.9-> 4.9 5/5 Hypotensive, responded to IVF + albumin, on heparin gtt but stopped due to platelet decline, not on pressors, requiring PEEP 10 / FiO2 80. RUQ [place] with cholelithiasis but no cholecystitis.   5/6 Vent needs decreased. LE venous duplex negative.  5/7 diarrhea > Placed flexiseal 5/8 5/8 WUA/SBT, extubated   Discharge process was complicated as patient out of state, with bipap dependence, lacks insurance.   [facility] was able to be arranged. Home BiPAP and supplemental oxygen obtained. Despite adequate oxygen saturations documented, patient will require supplemental O2 for use with BiPAP.  Patient clinically improved and stable for d/c from the hospital today.

## 2020-07-23 NOTE — TOC Progression Note (Signed)
Transition of Care [facility]) - Progression Note    Patient Details  Name: Felicia Warren MRN: [id number] Date of Birth: 23-Apr-1970  Transition of Care [facility]) CM/SW Contact  Joaquin Courts, RN Phone Number: 07/23/2020, 3:40 PM  Clinical Narrative:    Met with patient at bedside re discharge planning, patient reports she has made arrangements at the extended stay hotel at [place].  Patient is asking for transportation assistance, reservations are in place starting 07/25/20.  Spoke with patient about equipment needs, patient is agreeable to a bariatric walker (to be arranged with [employer], rep made aware of need).  Cm spoke with patient about bipap need and cost of equipment.  Patient is agreeable to monthly payments, [employer] rep Andree Coss spoke with patient regarding this.  LOG request submitted to leadership to assist with payment for bipap for the first month with patient being responsible for subsequent payments.  Spoke with [place] regarding charity [place] services, Patient was also made aware that TOC will search for charity services but cannot guarantee that an agency will accept for services.  Tentative discharge plan is for 07/25/20 to extended stay hotel, [employer] is following with plan to set up bipap machine on 6/3 prior to discharge.    Expected Discharge Plan: Homeless Shelter Barriers to Discharge: Continued Medical Work up  Expected Discharge Plan and Services Expected Discharge Plan: [place] arrangements for the past 2 months: Hotel/Motel                                       Social Determinants of Health (SDOH) Interventions    Readmission Risk Interventions No flowsheet data found.

## 2020-07-23 NOTE — Progress Notes (Signed)
PT Cancellation Note  Patient Details Name: Eisa Conaway MRN: [id number] DOB: 1970-03-27   Cancelled Treatment:    Reason Eval/Treat Not Completed: Other (comment)pt with  SW or financial person. Will check back another time.   [name] 07/23/2020, 2:50 PM  [name] PT Acute Rehabilitation Services Pager [phone] Office [phone]

## 2020-07-24 NOTE — TOC Progression Note (Addendum)
Transition of Care [facility]) - Progression Note    Patient Details  Name: Felicia Warren MRN: [id number] Date of Birth: 1970/11/29  Transition of [facility]) CM/SW Contact  Geni Bers, RN Phone Number: 07/24/2020, 12:57 PM  Clinical Narrative:     Spoke with pt concerning discharge plans for in the AM, Friday 6/3. Pt will discharge to [facility], [address] [place], [place] [address], [phone]. Frances Furbish will follow pt for HHPT and [employer] for BIPAP and DME RW/HD. Pt's cell number [phone]. Hospital follow up appointment was made with [facility] with Dr. Laural Benes on July 14 at 9:15 AM for an 9:30 AM appointment. Pt is aware of this information, explained twice. Pt was able to repeat this information back. RN will need to call for transportation, RIDE.   Expected Discharge Plan: Homeless Shelter Barriers to Discharge: Continued Medical Work up  Expected Discharge Plan and Services Expected Discharge Plan: Homeless Shelter       Living arrangements for the past 2 months: Hotel/Motel                                       Social Determinants of Health (SDOH) Interventions    Readmission Risk Interventions No flowsheet data found.

## 2020-07-24 NOTE — Progress Notes (Signed)
PROGRESS NOTE    [name] Sadik   ZOX:[id number]  DOB: 01/28/1971  PCP: Pcp, No    DOA: 06/25/2020 LOS: 4129   Brief Narrative   50 yo F who presented initially as a Erskine SquibbJane Doe, found with altered mental status in a motel by staff.  It is unknown when her last known well was.  EMS found patient awake, but altered with a glucose of 40.  This was corrected, but she continued to have worsening mental status.  Narcan did not provide any response. Eventual intubation for hypoxic respiratory failure.  Chest x-ray with probable multifocal pneumonia.  AKI, elevated INR on admit. No prior history in epic as she is from [place] and no known emergency contact. Admitted by PCCM given requirement for airway protection/intubation.    5/4 presented to ED with hypoglycemia and AMS, required intubation, PCCM consult. CXR concerning for PNA.  Unasyn initiated. [facility] UDS neg, neg tylenol/ salicylates. CTH neg. Lactic acid 5.9-> 4.9  5/5 Hypotensive, responded to IVF + albumin, on heparin gtt but stopped due to platelet decline, not on pressors, requiring PEEP 10 / FiO2 80. RUQ US with cholelithiasis but no cholecystitis.    5/6 Vent needs decreased. LE venous duplex negative.   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  No insurance.  SW looking into adult medicaid.    Assessment & Plan   Principal Problem:   Acute respiratory failure with hypoxia and hypercarbia (HCC) Active Problems:   Hypercapnic respiratory failure (HCC)   Elevated LFTs   Acute metabolic encephalopathy   Pulmonary emboli (HCC)   Transaminitis   Hypokalemia   Hypoglycemia   Acute on chronic hypoxic hypercarbic respiratory failure/probable OSA/OHS -Patient now needing BiPAP nightly  -Goal sats 88 to 92% -Treated with Lasix, transitioned to PO torsemide -Metabolic acidosis has resolved.  Diamox discontinued.. -It is noted  that seal on BiPAP also remain subpar at times. -Will need outpatient sleep study.   -Will need outpatient follow-up with pulmonary.   Acute metabolic encephalopathy -Secondary to chronic CO2 retention.  -Clinical improvement. -VBG was noted to have compensated respiratory acidosis. -Likely needs outpatient sleep study as patient would likely probable OHS and OSA. -Currently tolerating BiPAP nightly only.   Pulmonary embolism -CT angiogram chest from 06/30/2020 with an area suspicious for segmental thrombus in the left lower lobe associated airspace disease and probably likely pulmonary infarct. -Images concerning for right heart strain. -2D echo done was concerning for McConnell sign. -Lower extremity Dopplers negative for DVT. -Continue Xarelto.    -Outpatient follow-up.     AKI with unknown baseline resolved/AG MA/lactic acidosis resolved/contraction alkalosis -Renal function improved. -Diamox discontinued. -Avoid nephrotoxic agents.    Hypokalemia -Secondary to diuresis.   -Continue oral daily potassium supplementation of 40 mEq daily.   -May need to increase potassium supplementation to 60 mEq daily or 80 mEq daily.   -daily labs  Demand ischemia -Troponin noted to be downtrending with oxygenation. -2D echo with EF of 55 to 60%, NWMA, McConnell sign with moderately enlarged RV intraventricular septal flattening concerning for RV overload (likely secondary to OHS). -Suspected left lower lobe segmental PE as above. -Continue Xarelto. -Continue Demadex. -Seen by cardiology and patient may benefit from outpatient right heart cath. -We will need outpatient follow-up with cardiology  Resolved Transaminitis -?  Etiology. -Concern for possible shock liver. -Acute hepatitis panel negative. -Right upper quadrant  ultrasound with cholelithiasis and negative for cholecystitis. -LFTs have trended down and transaminitis resolved.  Hypoglycemia -Patient noted on admission to  have glucose of 40s. -Hypoglycemia resolved with dextrose infusion on admission.    Diarrhea, resolved -Likely in the setting of tube feeds and bowel regimen  normocytic anemia/anemia of chronic disease -Transfusion threshold hemoglobin < 7.  Social barriers -patient from [place] and uninsured; her wish is to return to [place] to collect belongings and move to [place] as she has family here, however she has nowhere here to live yet if she moved - not sure if family would take her in after this hospitalization but this has been brought up to them and it didn't appear to be an option - if she's needing nightly BIPAP, that will be a factor; and/or if she needs continuous O2 that will also be a factor. -Mobility is improving.  PT following.  Patient noted to be out of bed and ambulated in her room.  - until these issues are fixed and/or addressed, she will remain in the hospital a prolonged amount of time -TOC consulted to help with disposition.   Morbid obesity Patient BMI: Body mass index is 63.84 kg/m.  Complicates overall care and prognosis.  Recommend lifestyle modifications including physical activity and diet for weight loss and overall long-term health.   DVT prophylaxis: Place and maintain sequential compression device Start: 06/26/20 1150 SCDs Start: 06/25/20 1828 rivaroxaban (XARELTO) tablet 20 mg   Diet:  Diet Orders (From admission, onward)    Start     Ordered   07/20/20 1033  Diet Heart Room service appropriate? Yes; Fluid consistency: Thin  Diet effective now       Question Answer Comment  Room service appropriate? Yes   Fluid consistency: Thin      07/20/20 1033            Code Status: Full Code    Subjective 07/24/20    Pt sleeping comfortably when seen this morning.  She wakes to voice easily.  Reports is being tired today but has no other acute complaints.   Disposition Plan & Communication   Status is: Inpatient  Remains inpatient appropriate  because:Inpatient level of care appropriate due to severity of illness and Requires BiPAP/trilogy nightly and cannot be safely discharged until this has been obtained.  Unsafe discharge.  Otherwise medically stable   Dispo: The patient is from: Home              Anticipated d/c is to: [facility] 6/3              Patient currently is medically stable to d/c.   Difficult to place patient Yes   Consults, Procedures, Significant Events   Consultants:   Infectious disease  Cardiology  Pulmonology/PCCM admission  Procedures:   CT angiogram chest 06/30/2020  CT head 06/25/2020  Abdominal ultrasound 06/26/2020  Renal ultrasound 06/26/2020  2D echo 07/11/2020  Lower extremity Dopplers 06/27/2020   Antimicrobials:  Anti-infectives (From admission, onward)   Start     Dose/Rate Route Frequency Ordered Stop   06/26/20 2000  cefTRIAXone (ROCEPHIN) 2 g in sodium chloride 0.9 % 100 mL IVPB  Status:  Discontinued        2 g 200 mL/hr over 30 Minutes Intravenous Every 24 hours 06/26/20 1818 07/01/20 1552   06/25/20 1900  Ampicillin-Sulbactam (UNASYN) 3 g in sodium chloride 0.9 % 100 mL IVPB  Status:  Discontinued        3 g  200 mL/hr over 30 Minutes Intravenous Every 12 hours 06/25/20 1857 06/26/20 1818   06/25/20 1700  cefTRIAXone (ROCEPHIN) 1 g in sodium chloride 0.9 % 100 mL IVPB        1 g 200 mL/hr over 30 Minutes Intravenous  Once 06/25/20 1649 06/25/20 1859   06/25/20 1700  azithromycin (ZITHROMAX) 500 mg in sodium chloride 0.9 % 250 mL IVPB        500 mg 250 mL/hr over 60 Minutes Intravenous  Once 06/25/20 1649 06/25/20 2005        Micro    Objective   Vitals:   07/23/20 1247 07/23/20 2011 07/23/20 2352 07/24/20 0419  BP: (!) 102/55 (!) 104/58    Pulse: 74 90 87 73  Resp: 20  (!) 25 (!) 23  Temp: 98.7 F (37.1 C) 98.8 F (37.1 C)    TempSrc: Oral Oral    SpO2: 98% 95% 97%   Weight:      Height:        Intake/Output Summary (Last 24 hours) at 07/24/2020 1314 Last data  filed at 07/24/2020 0900 Gross per 24 hour  Intake 240 ml  Output --  Net 240 ml   Filed Weights   07/20/20 0500 07/21/20 0500 07/22/20 0500  Weight: (!) 214.1 kg (!) 224.4 kg (!) 179.4 kg    Physical Exam:  General exam: Sleeping comfortably, wakes to voice, no acute distress, morbidly obese Respiratory system: CTAB but diminished due to body habitus, normal respiratory effort, on room air. Cardiovascular system: normal S1/S2, RRR,  Central nervous system: no gross focal neurologic deficits, normal speech Extremities: moves all, no edema, normal tone   Labs   Data Reviewed: I have personally reviewed following labs and imaging studies  CBC: Recent Labs  Lab 07/22/20 0358  WBC 6.8  HGB 11.2*  HCT 39.6  MCV 84.4  PLT 285   Basic Metabolic Panel: Recent Labs  Lab 07/18/20 0423 07/19/20 0217 07/20/20 0405 07/21/20 0402 07/22/20 0358  NA 139 138 139 138 137  K 3.8 4.7 3.8 3.5 3.8  CL 97* 97* 96* 95* 96*  CO2 33* 31 36* 36* 31  GLUCOSE 93 84 104* 92 99  BUN 12 11 13 14 16   CREATININE 0.79 0.90 1.07* 0.92 1.01*  CALCIUM 8.8* 8.8* 8.7* 8.6* 8.6*  MG  --   --  2.1  --  2.0   GFR: Estimated Creatinine Clearance: 111.6 mL/min (A) (by C-G formula based on SCr of 1.01 mg/dL (H)). Liver Function Tests: No results for input(s): AST, ALT, ALKPHOS, BILITOT, PROT, ALBUMIN in the last 168 hours. No results for input(s): LIPASE, AMYLASE in the last 168 hours. No results for input(s): AMMONIA in the last 168 hours. Coagulation Profile: No results for input(s): INR, PROTIME in the last 168 hours. Cardiac Enzymes: No results for input(s): CKTOTAL, CKMB, CKMBINDEX, TROPONINI in the last 168 hours. BNP (last 3 results) No results for input(s): PROBNP in the last 8760 hours. HbA1C: No results for input(s): HGBA1C in the last 72 hours. CBG: Recent Labs  Lab 07/22/20 2201 07/23/20 0751 07/23/20 1149 07/23/20 1641 07/23/20 2104  GLUCAP 117* 98 149* 115* 103*   Lipid  Profile: No results for input(s): CHOL, HDL, LDLCALC, TRIG, CHOLHDL, LDLDIRECT in the last 72 hours. Thyroid Function Tests: No results for input(s): TSH, T4TOTAL, FREET4, T3FREE, THYROIDAB in the last 72 hours. Anemia Panel: No results for input(s): VITAMINB12, FOLATE, FERRITIN, TIBC, IRON, RETICCTPCT in the last 72 hours. Sepsis Labs:  No results for input(s): PROCALCITON, LATICACIDVEN in the last 168 hours.  No results found for this or any previous visit (from the past 240 hour(s)).    Imaging Studies   No results found.   Medications   Scheduled Meds: . (feeding supplement) PROSource Plus  30 mL Oral TID BM  . magnesium oxide  800 mg Oral BID  . mouth rinse  15 mL Mouth Rinse BID  . multivitamin with minerals  1 tablet Oral Daily  . pantoprazole  40 mg Oral QHS  . potassium chloride  40 mEq Oral Daily  . Ensure Max Protein  11 oz Oral Daily  . rivaroxaban  20 mg Oral Q supper  . torsemide  40 mg Oral BID   Continuous Infusions:     LOS: 29 days    Time spent: 20 minutes    Pennie Banter, DO [facility]  07/24/2020, 1:14 PM      If 7PM-7AM, please contact night-coverage. How to contact the [facility] Attending or Consulting provider 7A - 7P or covering provider during after hours 7P -7A, for this patient?    1. Check the care team in [facility] and look for a) attending/consulting TRH provider listed and b) the [facility] team listed 2. Log into [email or web address] and use [place]'s universal password to access. If you do not have the password, please contact the hospital operator. 3. Locate the [facility] provider you are looking for under [facility] and page to a number that you can be directly reached. 4. If you still have difficulty reaching the provider, please page the [facility] (Director on Call) for the Hospitalists listed on amion for assistance.

## 2020-07-24 NOTE — Progress Notes (Signed)
Occupational Therapy Treatment Patient Details Name: Felicia Warren MRN: [id number] DOB: 04-01-1970 Today's Date: 07/24/2020    History of present illness Patient is a 50 year old female admitted with hypoglycemia and AMS, needing intubation on 5/4 and extubated on 5/8. Unsure of past medical history, patient currently living in [place] plans to move to [place] to be closer to family.   OT comments  Patient up in chair when therapist entered room. Reports independence with toileting without nursing assistance. Patient demonstrated ability to don socks and perform other lower body dressing at side of bed in pseudo activity. Patient provided with long handled reacher and educated on use if needed for dressing or other activities. Patient provided with long handled sponge for bathing to aide with ease of task. Patient demonstrated ability to perform in room ambulation without dme and with rolator. Patient reports she will have a walk in shower with built in shower seat at discharge. Patient at functional level with AE as needed . Patient verbalizes understanding of all education and instruction. OT goals has been met. No further OT needs.    Follow Up Recommendations  No OT follow up    Equipment Recommendations  None recommended by OT    Recommendations for Other Services      Precautions / Restrictions Precautions Precautions: None Restrictions Weight Bearing Restrictions: No       Mobility Bed Mobility               General bed mobility comments: OOB in recliner    Transfers Overall transfer level: Modified independent               General transfer comment: Has been performing in room ambulation with mod I - using hand holds on furniture. Rolator in room provided for patient use. Therapist instructed patient on use of breaks and safety.    Balance Overall balance assessment: Mild deficits observed, not formally tested                                          ADL either performed or assessed with clinical judgement   ADL Overall ADL's : Modified independent Eating/Feeding: Independent   Grooming: Independent         Lower Body Bathing Details (indicate cue type and reason): Patient provided with LH sponge. verbalized understanding of use. Upper Body Dressing : Independent   Lower Body Dressing: Modified independent Lower Body Dressing Details (indicate cue type and reason): Independent to don socks with increased time. Provided with LH reacher to improve ease of task if needed. But exhibits ability to don clothing at side of bed well (pseudo activity). Toilet Transfer: Modified Programmer, applications Details (indicate cue type and reason): to [facility] - has been independent with BSC transfers in room. Toileting- Clothing Manipulation and Hygiene: Modified independent Toileting - Clothing Manipulation Details (indicate cue type and reason): Has been independent with toileting in room.   Tub/Shower Transfer Details (indicate cue type and reason): Patient will have walk n shower with bench in hotel room. Functional mobility during ADLs: Modified independent       Vision Patient Visual Report: No change from baseline     Perception     Praxis      Cognition Arousal/Alertness: Awake/alert Behavior During Therapy: WFL for tasks assessed/performed Overall Cognitive Status: Within Functional Limits for tasks assessed  Exercises     Shoulder Instructions       General Comments      Pertinent Vitals/ Pain       Pain Assessment: No/denies pain  Home Living                                          Prior Functioning/Environment              Frequency           Progress Toward Goals  OT Goals(current goals can now be found in the care plan section)  Progress towards OT goals: Goals met/education completed, patient discharged  from [place] All goals met and education completed, patient discharged from OT services    Co-evaluation          OT goals addressed during session: ADL's and self-care      AM-PAC OT "6 Clicks" Daily Activity     Outcome Measure   Help from another person eating meals?: None Help from another person taking care of personal grooming?: None Help from another person toileting, which includes using toliet, bedpan, or urinal?: None Help from another person bathing (including washing, rinsing, drying)?: None Help from another person to put on and taking off regular upper body clothing?: None Help from another person to put on and taking off regular lower body clothing?: None 6 Click Score: 24    End of Session Equipment Utilized During Treatment: Other (comment) (rolator)  OT Visit Diagnosis: Other abnormalities of gait and mobility (R26.89);Muscle weakness (generalized) (M62.81);Other symptoms and signs involving cognitive function   Activity Tolerance Patient tolerated treatment well   Patient Left in chair;with call bell/phone within reach   Nurse Communication Mobility status        Time: 1453-1511 OT Time Calculation (min): 18 min  Charges: OT General Charges $OT Visit: 1 Visit OT Treatments $Self Care/Home Management : 8-22 mins  Derl Barrow, OTR/L [place]  Office [phone] Pager: [place] 07/24/2020, 3:23 PM

## 2020-07-25 ENCOUNTER — Telehealth: Payer: Self-pay

## 2020-07-25 MED ORDER — ADULT MULTIVITAMIN W/MINERALS CH: 1.0000 | ORAL_TABLET | Freq: Every day | ORAL | Status: DC

## 2020-07-25 MED ORDER — PANTOPRAZOLE SODIUM 40 MG PO TBEC: 40.0000 mg | DELAYED_RELEASE_TABLET | Freq: Every day | ORAL | 2 refills | Status: DC

## 2020-07-25 MED ORDER — MAGNESIUM OXIDE -MG SUPPLEMENT 400 (240 MG) MG PO TABS: 800.0000 mg | ORAL_TABLET | Freq: Two times a day (BID) | ORAL | 0 refills | Status: DC

## 2020-07-25 MED ORDER — TORSEMIDE 40 MG PO TABS: 40.0000 mg | ORAL_TABLET | Freq: Two times a day (BID) | ORAL | 2 refills | Status: DC

## 2020-07-25 MED ORDER — RIVAROXABAN 20 MG PO TABS: 20.0000 mg | ORAL_TABLET | Freq: Every day | ORAL | 2 refills | Status: DC

## 2020-07-25 MED ORDER — ENSURE MAX PROTEIN PO LIQD: 11.0000 [oz_av] | Freq: Every day | ORAL | Status: DC

## 2020-07-25 MED ORDER — POTASSIUM CHLORIDE CRYS ER 20 MEQ PO TBCR: 40.0000 meq | EXTENDED_RELEASE_TABLET | Freq: Every day | ORAL | 0 refills | Status: DC

## 2020-07-25 NOTE — Discharge Summary (Signed)
Physician Discharge Summary  Felicia Warren ZOX:[id number] DOB: January 22, 1971 DOA: 06/25/2020  PCP: Oneita Hurt, No  Admit date: 06/25/2020 Discharge date: 07/25/2020  Admitted From: hotel (visiting from [place]) Disposition:  Hotel (Extended Stay)  Recommendations for Outpatient Follow-up:   1. Follow up with PCP in 1-2 weeks [facility] with Dr. Laural Benes on July 14 at 9:15 AM for an 9:30 AM  2.  Please obtain BMP/CBC in one week 3.  Please refer patient for sleepy study   Home Health: PT Equipment/Devices: BiPAP, bariatric rolling walker w/seat  Discharge Condition: stable  CODE STATUS: full  Diet recommendation: Heart Healthy      Discharge Diagnoses: Principal Problem:   Acute respiratory failure with hypoxia and hypercarbia (HCC) Active Problems:   Hypercapnic respiratory failure (HCC)   Elevated LFTs   Acute metabolic encephalopathy   Pulmonary emboli (HCC)   Transaminitis   Hypokalemia   Hypoglycemia    Summary of HPI and Hospital Course:  50 yo F who presented initially as a Felicia Warren, found with altered mental status in a motel by staff.  It is unknown when her last known well was.  EMS found patient awake, but altered with a glucose of 40.  This was corrected, but she continued to have worsening mental status.  Narcan did not provide any response. Eventual intubation for hypoxic respiratory failure.  Chest x-ray with probable multifocal pneumonia.  AKI, elevated INR on admit. No prior history in epic as she is from [place] and no known emergency contact. Admitted by PCCM given requirement for airway protection/intubation.    5/4 presented to ED with hypoglycemia and AMS, required intubation, PCCM consult. CXR concerning for PNA.  Unasyn initiated. Tx to [facility] for beds. UDS neg, neg tylenol/ salicylates. CTH neg. Lactic acid 5.9-> 4.9  5/5 Hypotensive, responded to IVF + albumin, on heparin gtt but stopped due to platelet decline, not on pressors, requiring PEEP  10 / FiO2 80. RUQ [place] with cholelithiasis but no cholecystitis.    5/6 Vent needs decreased. LE venous duplex negative.   5/7 diarrhea > Placed flexiseal 5/8  5/8 WUA/SBT, extubated   Discharge process was complicated as [place] out of [place], with bipap dependence, lacks insurance.   [facility] was able to be arranged. Home BiPAP and supplemental oxygen obtained. Despite adequate oxygen saturations documented, patient will require supplemental O2 for use with BiPAP.  Patient clinically improved and stable for d/c from the hospital today.      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 Diamox discontinued.. -Will need outpatient sleep study.  -Will need outpatient referral to pulmonary.   Acute metabolic encephalopathy - resolved -Secondary to chronic CO2 retention.  -VBG with compensated respiratory acidosis. -Advised outpatient sleep study as patient would likely probable OHS and OSA. -BiPAP nightly   Pulmonary embolism -CT angiogram chest from 06/30/2020 with an area suspicious for segmental thrombus in the left lower lobe associated airspace disease and probably likely pulmonary infarct. -Images concerning for right heart strain. -2D echo done was concerning for McConnell sign. -Lower extremity Dopplers negative for DVT. -Continue Xarelto.  -Outpatient follow-up.   AKI with unknown baseline resolved/AG MA/lactic acidosis resolved/contractionalkalosis -Renal function improved. -Diamox discontinued. -Avoid nephrotoxic agents.  Hypokalemia -Secondary to diuresis.  -Continue oral daily potassium 40 mEq  daily.  -PCP follow up for labs and any refills.  Demand ischemia -Troponin noted to be downtrended with  oxygenation. -2D echo with EF of 55 to 60%, NWMA, McConnell sign with moderately enlarged RV intraventricular septal flattening concerning for RV overload (likely secondary to OHS). -Suspected left lower lobe segmental PE as above. -Continue Xarelto. -Continue Demadex. -Seen by cardiology and patient may benefit from outpatient right heart cath. -We will need outpatient referral to cardiology  ResolvedTransaminitis - resolved -? Etiology.  Possible shock liver. -Acute hepatitis panel negative. -RUQ ultrasound with cholelithiasis and negative for cholecystitis.  Hypoglycemia - resolved -On admission to have glucose of 40s. -Resolved with dextrose infusion on admission.   Diarrhea, resolved  -Likely in the setting of tube feeds and bowel regimen  Normocytic anemia/anemia of chronic disease -Transfusion threshold hemoglobin < 7. Stable.  CBC in follow up.  Social barriers -patient from [place] and uninsured; her wish is to return to [place] to collect belongings and move to [place] as she has family here, however she has nowhere here to live yet if she moved - not sure if family would take her in after this hospitalization but this has been brought up to them and it didn't appear to be an option - if she's needing nightly BIPAP, that will be a factor; and/or if she needs continuous O2 that will also be a factor. -Mobility is improving. PT following. Patient noted to be out of bed and ambulated in her room.  - until these issues are fixed and/or addressed, she will remain in the hospital a prolonged amount of time -TOC consulted to help with disposition.  Morbid obesity Patient BMI: Body mass index is 63.84 kg/m.  Complicates overall care and prognosis.  Recommend lifestyle modifications including physical activity and diet for weight loss and overall long-term health.   Discharge Instructions   Discharge Instructions    (HEART FAILURE PATIENTS) Call MD:  Anytime you  have any of the following symptoms: 1) 3 pound weight gain in 24 hours or 5 pounds in 1 week 2) shortness of breath, with or without a dry hacking cough 3) swelling in the hands, feet or stomach 4) if you have to sleep on extra pillows at night in order to breathe.   Complete by: As directed    Call MD for:  extreme fatigue   Complete by: As directed    Call MD for:  persistant dizziness or light-headedness   Complete by: As directed    Call MD for:  persistant nausea and vomiting   Complete by: As directed    Call MD for:  severe uncontrolled pain   Complete by: As directed    Call MD for:  temperature >100.4   Complete by: As directed    Diet - low sodium heart healthy   Complete by: As directed    Discharge instructions   Complete by: As directed    It is VERY important that you continue to use BiPAP machine when you are asleep.  Please be sure to go to your scheduled follow up appointment. Primary care should get you set up for a sleep study to evaluate for sleep apnea / hypoventilation syndrome.   Increase activity slowly   Complete by: As directed    No wound care   Complete by: As directed      Allergies as of 07/25/2020   No Known Allergies     Medication List    TAKE these medications  Ensure Max Protein Liqd Take 330 mLs (11 oz total) by mouth daily.   magnesium oxide 400 (240 Mg) MG tablet Commonly known as: MAG-OX Take 2 tablets (800 mg total) by mouth 2 (two) times daily.   multivitamin with minerals Tabs tablet Take 1 tablet by mouth daily. Start taking on: July 26, 2020   pantoprazole 40 MG tablet Commonly known as: PROTONIX Take 1 tablet (40 mg total) by mouth at bedtime.   potassium chloride SA 20 MEQ tablet Commonly known as: KLOR-CON Take 2 tablets (40 mEq total) by mouth daily. Start taking on: July 26, 2020   rivaroxaban 20 MG Tabs tablet Commonly known as: XARELTO Take 1 tablet (20 mg total) by mouth daily with supper.   Torsemide 40 MG  Tabs Take 40 mg by mouth 2 (two) times daily.            Durable Medical Equipment  (From admission, onward)         Start     Ordered   07/25/20 1028  For home use only DME oxygen  Once       Question Answer Comment  Length of Need Lifetime   Mode or (Route) Nasal cannula   Liters per Minute 2   Frequency Continuous (stationary and portable oxygen unit needed)   Oxygen conserving device No   Oxygen delivery system Gas      07/25/20 1028   07/25/20 1017  For home use only DME Bipap  Once       Comments: 6 expiratory pressure Bipap supplies  Question Answer Comment  Length of Need Lifetime   Bleed in oxygen (LPM) 2L O2   Keep 02 saturation 90%   Inspiratory pressure 12   Expiratory pressure OTHER SEE COMMENTS      07/25/20 1025   07/23/20 1421  For home use only DME 4 wheeled rolling walker with seat  Once       Comments: HD  Question:  Patient needs a walker to treat with the following condition  Answer:  Fear for personal safety   07/23/20 1421          Follow-up Information    [facility] Follow up.   Specialty: Home Health Services Why: Home Health PT will follow you to hotel. Please call the above number with your room number and address.  Contact information: [address] [place] [address] [phone]        [facility] Follow up.   Why: Your walker and BIPAP machine is from this company. Call if you have any questions or problems.  Contact information: [address] [place] [address] [phone]        [facility] Follow up on 09/04/2020.   Why: Appointment with Dr. Laural Benes on 7/14 at 9:15 AM for 9:30 AM appointment. Please try to keep this appointment.  Contact information: [address] [place] [address] [phone]             No Known Allergies   If you experience worsening of your admission symptoms, develop shortness of  breath, life threatening emergency, suicidal or homicidal thoughts you must seek medical attention immediately by calling 911 or calling your MD immediately  if symptoms less severe.    Please note   You were cared for by a hospitalist during your hospital stay. If you have any questions about your discharge medications or the care you received  while you were in the hospital after you are discharged, you can call the unit and asked to speak with the hospitalist on call if the hospitalist that took care of you is not available. Once you are discharged, your primary care physician will handle any further medical issues. Please note that NO REFILLS for any discharge medications will be authorized once you are discharged, as it is imperative that you return to your primary care physician (or establish a relationship with a primary care physician if you do not have one) for your aftercare needs so that they can reassess your need for medications and monitor your lab values.   Consultations:  Infectious disease  Cardiology  PCCM / Pulmonology   Procedures/Studies: DG Chest 1 View  Result Date: 07/05/2020 CLINICAL DATA:  Shortness of breath EXAM: CHEST  1 VIEW COMPARISON:  Yesterday FINDINGS: Cardiomegaly, vascular congestion, and hilar vascular thickening. Hazy and interstitial density on both sides. Airspace disease bilaterally. No visible effusion or pneumothorax. IMPRESSION: 1. Stable compared to yesterday. 2. Cardiomegaly and vascular congestion with airspace disease/lung infarct by recent chest CT. Electronically Signed   By: Marnee Spring M.D.   On: 07/05/2020 07:13   DG Chest 1 View  Result Date: 06/26/2020 CLINICAL DATA:  Hypoxia.  Central catheter placement EXAM: CHEST  1 VIEW COMPARISON:  Jun 26, 2020 study obtained earlier in the day FINDINGS: Right jugular catheter tip is at the cavoatrial junction. Endotracheal tube tip is 2.8 cm above the carina. Nasogastric tube tip and side port are  in the stomach. No appreciable pneumothorax. Ill-defined airspace opacity is noted in each lower lung region, similar to earlier in the day. There is persistent elevation of the right hemidiaphragm. Heart is prominent, stable, with pulmonary vascularity within normal limits. No adenopathy. No bone lesions. IMPRESSION: Tube and catheter positions as described without pneumothorax. Persistent elevation right hemidiaphragm. Airspace opacity in both lower lung regions is stable. No new opacity. Stable cardiac prominence. Electronically Signed   By: Bretta Bang III M.D.   On: 06/26/2020 14:33   CT Head Wo Contrast  Result Date: 06/25/2020 CLINICAL DATA:  Altered mental status EXAM: CT HEAD WITHOUT CONTRAST TECHNIQUE: Contiguous axial images were obtained from the base of the skull through the vertex without intravenous contrast. COMPARISON:  None. FINDINGS: Brain: Normal anatomic configuration. No abnormal intra or extra-axial mass lesion or fluid collection. No abnormal mass effect or midline shift. No evidence of acute intracranial hemorrhage or infarct. Ventricular size is normal. Cerebellum unremarkable. Vascular: Unremarkable Skull: Intact Sinuses/Orbits: Paranasal sinuses are clear. Orbits are unremarkable. Other: Mastoid air cells and middle ear cavities are clear. IMPRESSION: No acute intracranial abnormality. Electronically Signed   By: Helyn Numbers MD   On: 06/25/2020 19:23   CT ANGIO CHEST PE W OR WO CONTRAST  Addendum Date: 06/30/2020   ADDENDUM REPORT: 06/30/2020 15:17 ADDENDUM: In addition to findings outlined in the previous report there is dilation of the main pulmonary artery up to 3.2 cm which may indicate pulmonary arterial hypertension. Critical Value/emergent results were called by telephone at the time of interpretation on 06/30/2020 at 3:16 pm to provider Canary Brim , who verbally acknowledged these results. Electronically Signed   By: Donzetta Kohut M.D.   On: 06/30/2020 15:17    Result Date: 06/30/2020 CLINICAL DATA:  Suspected pulmonary embolism in a 50 year old female. EXAM: CT ANGIOGRAPHY CHEST WITH CONTRAST TECHNIQUE: Multidetector CT imaging of the chest was performed using the standard protocol during bolus administration of intravenous  contrast. Multiplanar CT image reconstructions and MIPs were obtained to evaluate the vascular anatomy. CONTRAST:  OMNIPAQUE IOHEXOL 350 MG/ML SOLN COMPARISON:  Chest x-ray from Jun 30, 2020. FINDINGS: Cardiovascular: Pulmonary arterial opacification is limited in general exam limited by patient body habitus and respiratory motion. Density of main pulmonary artery approximately 194 Hounsfield units. No central pulmonary embolism. Heart size normal without pericardial effusion. Aortic caliber is normal. Marked motion at the lung bases. Pulmonary vasculature in the LEFT lung base showing limited assessment due to consolidative changes. RIGHT-sided central venous access device terminates in the distal superior vena cava. Amidst consolidative changes however in the LEFT lower lobe there is an abrupt cut off of a segmental LEFT lower lobe vascular branch that appears different from the adjacent vessel. (Image 47/4) there is surrounding consolidative change that shows variable enhancement, less enhancement laterally than in the medial LEFT lower lobe. RV to LV ratio 0.87 with some straightening of the intra tracheal ir septum. Mediastinum/Nodes: No adenopathy in the mediastinum. No axillary lymphadenopathy. No hilar lymphadenopathy. Lungs/Pleura: Dense basilar consolidative changes on the LEFT. Small LEFT-sided effusion. Minimal RIGHT lower lobe atelectasis. Upper Abdomen: Lobular splenic contours, spleen partially imaged. No acute upper abdominal process to the extent evaluated. Musculoskeletal: No acute musculoskeletal process. Spinal degenerative changes. Review of the MIP images confirms the above findings. IMPRESSION: 1. Pulmonary arterial  opacification is limited in general exam limited by patient body habitus and respiratory motion. No central pulmonary embolism but with a vessel "cut off" and area that is suspicious for segmental thrombus in the LEFT lower lobe, associated with airspace disease likely pulmonary infarct. 2. Correlation with lower extremity venous assessment may also be helpful given limitations of the exam as described. 3. Borderline RV to LV ratio with mild straightening of the interventricular septum. Could consider correlation with echocardiography as well given potential RIGHT heart strain 4. No gross material is seen in the trachea or LEFT mainstem bronchus. 5. Small LEFT-sided effusion. 6. Call is out to the referring provider to further discuss findings in the above case. Electronically Signed: By: Donzetta Kohut M.D. On: 06/30/2020 15:03   US RENAL  Result Date: 06/26/2020 CLINICAL DATA:  Acute renal injury EXAM: RENAL / URINARY TRACT ULTRASOUND COMPLETE COMPARISON:  None. FINDINGS: Examination is significantly limited due the patient's body habitus. Right Kidney: Renal measurements: 10.5 x 5.2 x 6.3 cm. = volume: 180 mL. Echogenicity within normal limits. No mass or hydronephrosis visualized. Left Kidney: Not well visualized Bladder: Decompressed by Foley catheter. Other: None. IMPRESSION: Extremely limited exam shows no acute abnormality in the right kidney. The left kidney is not visualized. Electronically Signed   By: Alcide Clever M.D.   On: 06/26/2020 08:45   DG Chest Port 1 View  Result Date: 07/04/2020 CLINICAL DATA:  Respiratory distress EXAM: PORTABLE CHEST 1 VIEW COMPARISON:  Four days ago FINDINGS: Cardiomegaly and vascular pedicle widening. Diffuse vascular prominence with cephalized blood flow and possible edema. There could be small volume pleural fluid on the left. More focal opacity in the right mid lung where there was atelectasis on a recent CT. Dense retrocardiac density. IMPRESSION: 1. Generalized  worsening aeration compared to 4 days ago. 2. Cardiomegaly and vascular congestion with pulmonary opacities described on recent chest CT. Electronically Signed   By: Marnee Spring M.D.   On: 07/04/2020 05:30   DG Chest Port 1 View  Result Date: 06/30/2020 CLINICAL DATA:  Acute respiratory failure with hypoxemia. EXAM: PORTABLE CHEST 1 VIEW COMPARISON:  Chest x-ray 06/28/2020 FINDINGS: Right internal jugular venous catheter with tip overlying the distal superior vena cava. The heart size and mediastinal contours are unchanged. Low lung volumes . persistent patchy airspace opacity. Left pleural effusion not excluded. No pneumothorax. No acute osseous abnormality. IMPRESSION: 1. Low lung volumes with persistent patchy airspace opacity. 2. Left pleural effusion not excluded. Electronically Signed   By: Tish FredericksonMorgane  Naveau M.D.   On: 06/30/2020 05:02   DG CHEST PORT 1 VIEW  Result Date: 06/28/2020 CLINICAL DATA:  Acute respiratory failure with hypoxia and hypercarbia EXAM: PORTABLE CHEST 1 VIEW COMPARISON:  06/26/2020 chest radiograph. FINDINGS: Endotracheal tube tip is 6.5 cm above the carina. Enteric tube terminates in the gastric fundus. Right internal jugular central venous catheter terminates over the cavoatrial junction. Stable cardiomediastinal silhouette with mild cardiomegaly. No pneumothorax. Possible small bilateral pleural effusions. Low lung volumes. Diffuse patchy parahilar fluffy opacities, similar. IMPRESSION: 1. Endotracheal tube tip 6.5 cm above the carina. 2. Mild cardiomegaly with diffuse patchy parahilar fluffy opacities, similar, favoring cardiogenic pulmonary edema versus atypical infection. 3. Low lung volumes.  Possible small bilateral pleural effusions. Electronically Signed   By: Delbert PhenixJason A Poff M.D.   On: 06/28/2020 07:34   DG CHEST PORT 1 VIEW  Result Date: 06/26/2020 CLINICAL DATA:  Respiratory failure. EXAM: PORTABLE CHEST 1 VIEW COMPARISON:  06/25/2020. FINDINGS: Endotracheal tube tip  again noted at the level the carina. Proximal repositioning of approximately 3 cm suggested. NG tube in stable position. Cardiomegaly. Bilateral pulmonary infiltrates/edema again noted. Interim slight improvement from prior exam. Stable elevation right hemidiaphragm. No pleural effusion or pneumothorax. IMPRESSION: 1. Endotracheal tube tip noted at the level the carina. Proximal repositioning of approximately 3 cm suggested. 2.  Cardiomegaly. 3. Bilateral pulmonary infiltrates/edema again noted. Slight improvement from prior exam. Stable elevation right hemidiaphragm. These results will be called to the ordering clinician or representative by the Radiologist Assistant, and communication documented in the PACS or [employer] Dashboard. Electronically Signed   By: Maisie Fushomas  Register   On: 06/26/2020 08:39   DG Chest Portable 1 View  Result Date: 06/25/2020 CLINICAL DATA:  Hypoxia EXAM: PORTABLE CHEST 1 VIEW COMPARISON:  None. FINDINGS: None endotracheal tube tip at or just above the carina. Esophageal tube tip below the diaphragm but incompletely visualized. Low lung volumes. Cardiomegaly. Streaky perihilar consolidations and patchy bilateral foci of airspace disease. Central bronchovascular crowding due to low lung volume. No pneumothorax or definitive effusion IMPRESSION: 1. Endotracheal tube tip at or just above the carina. 2. Low lung volumes. Perihilar consolidations and scattered foci of airspace disease, favor pneumonia. 3. Cardiomegaly Electronically Signed   By: Jasmine PangKim  Fujinaga M.D.   On: 06/25/2020 16:37   ECHOCARDIOGRAM COMPLETE  Result Date: 06/26/2020    ECHOCARDIOGRAM REPORT   Patient Name:   Felicia Warren Date of Exam: 06/26/2020 Medical Rec #:  [id number]     Height:       66.0 in Accession #:    [id number]    Weight:       440.0 lb Date of Birth:  07/07/1970      BSA:          2.796 m Patient Age:    49 years      BP:           126/68 mmHg Patient Gender: F             HR:           67 bpm. Exam Location:   Inpatient  Procedure: 2D Echo, Cardiac Doppler and Color Doppler Indications:    Respiratory failure (HCC) [098119]  History:        Patient has no prior history of Echocardiogram examinations.  Sonographer:    Eulah Pont RDCS Referring Phys: [phone] PAULA B SIMPSON IMPRESSIONS  1. Left ventricular ejection fraction, by estimation, is 55 to 60%. The left ventricle has normal function. The left ventricle has no regional wall motion abnormalities. Left ventricular diastolic parameters were normal. There is the interventricular septum is flattened in systole and diastole, consistent with right ventricular pressure and volume overload.  2. McConnell's Sign noted. Right ventricular systolic function is normal in the base. The right ventricular size is moderately enlarged.  3. The mitral valve is grossly normal. No evidence of mitral valve regurgitation.  4. The aortic valve is tricuspid. Aortic valve regurgitation is not visualized. No aortic stenosis is present.  5. There is mild dilatation of the ascending aorta, measuring 41 mm. Comparison(s): No prior Echocardiogram. Discussed finding with critical care team. FINDINGS  Left Ventricle: Left ventricular ejection fraction, by estimation, is 55 to 60%. The left ventricle has normal function. The left ventricle has no regional wall motion abnormalities. The left ventricular internal cavity size was normal in size. There is  no left ventricular hypertrophy. The interventricular septum is flattened in systole and diastole, consistent with right ventricular pressure and volume overload. Left ventricular diastolic parameters were normal. Right Ventricle: McConnell's Sign noted. The right ventricular size is moderately enlarged. No increase in right ventricular wall thickness. Right ventricular systolic function is normal. Left Atrium: Left atrial size was normal in size. Right Atrium: Right atrial size was normal in size. Pericardium: There is no evidence of pericardial  effusion. Mitral Valve: The mitral valve is grossly normal. No evidence of mitral valve regurgitation. Tricuspid Valve: The tricuspid valve is grossly normal. Tricuspid valve regurgitation is not demonstrated. Aortic Valve: The aortic valve is tricuspid. Aortic valve regurgitation is not visualized. No aortic stenosis is present. Pulmonic Valve: The pulmonic valve was not well visualized. Pulmonic valve regurgitation is not visualized. No evidence of pulmonic stenosis. Aorta: The aortic root is normal in size and structure. There is mild dilatation of the ascending aorta, measuring 41 mm. IAS/Shunts: The atrial septum is grossly normal.  LEFT VENTRICLE PLAX 2D LVIDd:         3.80 cm  Diastology LVIDs:         2.50 cm  LV e' medial:    9.15 cm/s LV PW:         1.60 cm  LV E/e' medial:  13.2 LV IVS:        1.10 cm  LV e' lateral:   9.60 cm/s LVOT diam:     2.10 cm  LV E/e' lateral: 12.6 LV SV:         90 LV SV Index:   32 LVOT Area:     3.46 cm  RIGHT VENTRICLE RV S prime:     11.60 cm/s TAPSE (M-mode): 1.7 cm LEFT ATRIUM             Index       RIGHT ATRIUM           Index LA diam:        3.30 cm 1.18 cm/m  RA Area:     12.50 cm LA Vol (A2C):   51.4 ml 18.38 ml/m RA Volume:   27.70 ml  9.91 ml/m LA Vol (A4C):   49.6  ml 17.74 ml/m LA Biplane Vol: 55.1 ml 19.71 ml/m  AORTIC VALVE LVOT Vmax:   137.00 cm/s LVOT Vmean:  89.300 cm/s LVOT VTI:    0.259 m  AORTA Ao Root diam: 3.10 cm Ao Asc diam:  4.10 cm MITRAL VALVE                TRICUSPID VALVE MV Area (PHT): 2.03 cm     TR Peak grad:   18.3 mmHg MV Decel Time: 373 msec     TR Vmax:        214.00 cm/s MV E velocity: 121.00 cm/s MV A velocity: 83.20 cm/s   SHUNTS MV E/A ratio:  1.45         Systemic VTI:  0.26 m                             Systemic Diam: 2.10 cm Riley Lam MD Electronically signed by Riley Lam MD Signature Date/Time: 06/26/2020/5:44:53 PM    Final    VAS [place] LOWER EXTREMITY VENOUS (DVT)  Result Date: 06/27/2020  Lower Venous  DVT Study Patient Name:  LINEA CALLES  Date of Exam:   06/27/2020 Medical Rec #: [id number]      Accession #:    [id number] Date of Birth: 12/20/1970       Patient Gender: F Patient Age:   80Y Exam Location:  [facility] Procedure:      VAS [place] LOWER EXTREMITY VENOUS (DVT) Referring Phys: [id number] PAULA B SIMPSON --------------------------------------------------------------------------------  Indications: Edema.  Limitations: Body habitus and poor ultrasound/tissue interface. Comparison Study: No previous exams Performing Technologist: Ernestene Mention  Examination Guidelines: A complete evaluation includes B-mode imaging, spectral Doppler, color Doppler, and power Doppler as needed of all accessible portions of each vessel. Bilateral testing is considered an integral part of a complete examination. Limited examinations for reoccurring indications may be performed as noted. The reflux portion of the exam is performed with the patient in reverse Trendelenburg.  +---------+---------------+---------+-----------+----------+-------------------+ RIGHT    CompressibilityPhasicitySpontaneityPropertiesThrombus Aging      +---------+---------------+---------+-----------+----------+-------------------+ CFV      Full           Yes      Yes                                      +---------+---------------+---------+-----------+----------+-------------------+ SFJ      Full                                                             +---------+---------------+---------+-----------+----------+-------------------+ FV Prox  Full                                                             +---------+---------------+---------+-----------+----------+-------------------+ FV Mid   Full                                                             +---------+---------------+---------+-----------+----------+-------------------+  FV Distal                                             Not visualized       +---------+---------------+---------+-----------+----------+-------------------+ PFV      Full                                                             +---------+---------------+---------+-----------+----------+-------------------+ POP      Full           Yes      Yes                                      +---------+---------------+---------+-----------+----------+-------------------+ PTV      Full                                         Not well visualized +---------+---------------+---------+-----------+----------+-------------------+ PERO     Full                                         Not well visualized +---------+---------------+---------+-----------+----------+-------------------+   Right Technical Findings: Not visualized segments include FV distal.  +---------+---------------+---------+-----------+----------+--------------+ LEFT     CompressibilityPhasicitySpontaneityPropertiesThrombus Aging +---------+---------------+---------+-----------+----------+--------------+ CFV      Full           Yes      Yes                                 +---------+---------------+---------+-----------+----------+--------------+ SFJ      Full                                                        +---------+---------------+---------+-----------+----------+--------------+ FV Prox  Full                                                        +---------+---------------+---------+-----------+----------+--------------+ FV Mid   Full                                                        +---------+---------------+---------+-----------+----------+--------------+ FV DistalFull                                                        +---------+---------------+---------+-----------+----------+--------------+  PFV      Full                                                        +---------+---------------+---------+-----------+----------+--------------+ POP                                                    Not visualized +---------+---------------+---------+-----------+----------+--------------+ PTV                                                   Not visualized +---------+---------------+---------+-----------+----------+--------------+ PERO                                                  Not visualized +---------+---------------+---------+-----------+----------+--------------+   Left Technical Findings: Not visualized segments include PopV, PeroV, PTV.   Summary: BILATERAL: -No evidence of popliteal cyst, bilaterally. RIGHT: - There is no evidence of deep vein thrombosis in the lower extremity. However, portions of this examination were limited- see technologist comments above.  LEFT: - There is no evidence of deep vein thrombosis in the lower extremity. However, portions of this examination were limited- see technologist comments above.  *See table(s) above for measurements and observations. Electronically signed by Fabienne Bruns MD on 06/27/2020 at 5:33:03 PM.    Final    [place] EKG SITE RITE  Result Date: 07/06/2020 If Site Rite image not attached, placement could not be confirmed due to current cardiac rhythm.  US Abdomen Limited RUQ (LIVER/GB)  Result Date: 06/26/2020 CLINICAL DATA:  Transaminitis, elevated liver function tests EXAM: ULTRASOUND ABDOMEN LIMITED RIGHT UPPER QUADRANT COMPARISON:  None. FINDINGS: Gallbladder: Extensive sludge and gallstones are seen within the gallbladder lumen. The gallbladder, however, is not distended, there is no gallbladder wall thickening identified, and no pericholecystic fluid is seen. The sonographic Eulah Pont sign is reportedly negative. Common bile duct: Diameter: 6-7 mm proximally Liver: There is limited evaluation of the a hepatic dome secondary to overlying osseous and pulmonary structures. The visualized inferior right and left hepatic lobes demonstrate normal echogenicity and echotexture. No focal  intrahepatic masses are seen. There is no intrahepatic biliary ductal dilation portal vein is patent on color Doppler imaging with normal direction of blood flow towards the liver. Other: No ascites IMPRESSION: Cholelithiasis without sonographic evidence of acute cholecystitis. Limited but unremarkable examination of the liver. Electronically Signed   By: Helyn Numbers MD   On: 06/26/2020 18:31         Subjective: Pt seen this AM and reports feeling well.  Tolerating Bipap without issues.  No SOB, CP, F/C, N/V or other acute complaints.     Discharge Exam: Vitals:   07/25/20 0257 07/25/20 0514  BP:  (!) 103/48  Pulse: 75 72  Resp: (!) 24 20  Temp:  98.2 F (36.8 C)  SpO2: 96% 96%   Vitals:   07/25/20 0004 07/25/20 0257 07/25/20 0514 07/25/20 0624  BP:   Marland Kitchen)  103/48   Pulse: 74 75 72   Resp: (!) 25 (!) 24 20   Temp:   98.2 F (36.8 C)   TempSrc:   Oral   SpO2: 98% 96% 96%   Weight:    (!) 178.3 kg  Height:        General: Pt is alert, awake, not in acute distress, morbidly obese Cardiovascular: RRR, S1/S2 +, no rubs, no gallops Respiratory: CTA bilaterally diminished due to body habitus, no wheezing, no rhonchi Abdominal: Soft, NT, ND, bowel sounds + Extremities: no edema, no cyanosis    The results of significant diagnostics from this hospitalization (including imaging, microbiology, ancillary and laboratory) are listed below for reference.     Microbiology: No results found for this or any previous visit (from the past 240 hour(s)).   Labs: BNP (last 3 results) Recent Labs    06/26/20 1435 07/04/20 0424  BNP 315.4* 472.5*   Basic Metabolic Panel: Recent Labs  Lab 07/19/20 0217 07/20/20 0405 07/21/20 0402 07/22/20 0358  NA 138 139 138 137  K 4.7 3.8 3.5 3.8  CL 97* 96* 95* 96*  CO2 31 36* 36* 31  GLUCOSE 84 104* 92 99  BUN CREATININE 0.90 1.07* 0.92 1.01*  CALCIUM 8.8* 8.7* 8.6* 8.6*  MG  --  2.1  --  2.0   Liver Function  Tests: No results for input(s): AST, ALT, ALKPHOS, BILITOT, PROT, ALBUMIN in the last 168 hours. No results for input(s): LIPASE, AMYLASE in the last 168 hours. No results for input(s): AMMONIA in the last 168 hours. CBC: Recent Labs  Lab 07/22/20 0358  WBC 6.8  HGB 11.2*  HCT 39.6  MCV 84.4  PLT 285   Cardiac Enzymes: No results for input(s): CKTOTAL, CKMB, CKMBINDEX, TROPONINI in the last 168 hours. BNP: Invalid input(s): POCBNP CBG: Recent Labs  Lab 07/22/20 2201 07/23/20 0751 07/23/20 1149 07/23/20 1641 07/23/20 2104  GLUCAP 117* 98 149* 115* 103*   D-Dimer No results for input(s): DDIMER in the last 72 hours. Hgb A1c No results for input(s): HGBA1C in the last 72 hours. Lipid Profile No results for input(s): CHOL, HDL, LDLCALC, TRIG, CHOLHDL, LDLDIRECT in the last 72 hours. Thyroid function studies No results for input(s): TSH, T4TOTAL, T3FREE, THYROIDAB in the last 72 hours.  Invalid input(s): FREET3 Anemia work up No results for input(s): VITAMINB12, FOLATE, FERRITIN, TIBC, IRON, RETICCTPCT in the last 72 hours. Urinalysis    Component Value Date/Time   COLORURINE AMBER (A) 06/25/2020 1727   APPEARANCEUR CLOUDY (A) 06/25/2020 1727   LABSPEC 1.020 06/25/2020 1727   PHURINE 5.0 06/25/2020 1727   GLUCOSEU 50 (A) 06/25/2020 1727   HGBUR NEGATIVE 06/25/2020 1727   BILIRUBINUR SMALL (A) 06/25/2020 1727   KETONESUR 5 (A) 06/25/2020 1727   PROTEINUR 100 (A) 06/25/2020 1727   NITRITE NEGATIVE 06/25/2020 1727   LEUKOCYTESUR NEGATIVE 06/25/2020 1727   Sepsis Labs Invalid input(s): PROCALCITONIN,  WBC,  LACTICIDVEN Microbiology No results found for this or any previous visit (from the past 240 hour(s)).   Time coordinating discharge: Over 30 minutes  SIGNED:   Pennie Banter, DO [facility] 07/25/2020, 10:45 AM   If 7PM-7AM, please contact night-coverage [email or web address]

## 2020-07-25 NOTE — Progress Notes (Signed)
Pt discharge to home instructions reviewed with pt and acknowledged understanding. SRP, RN

## 2020-07-25 NOTE — Plan of Care (Signed)

## 2020-07-25 NOTE — Telephone Encounter (Signed)
   Felicia Warren DOB: 1970/09/19 MRN: [id number]   [facility]ER WAIVER AND RELEASE OF LIABILITY  For purposes of improving physical access to our facilities, [place] is pleased to partner with third parties to provide [place] patients or other authorized individuals the option of convenient, on-demand ground transportation services (the [employer]") through use of the technology service that enables users to request on-demand ground transportation from independent third-party providers.  By opting to use and accept these [employer], I, the undersigned, hereby agree on behalf of myself, and on behalf of any minor child using the [employer] for whom I am the parent or legal guardian, as follows:  1. Science writer provided to me are provided by independent third-party transportation providers who are not [employer] or employees and who are unaffiliated with [employer]. 2. [facility] is neither a transportation carrier nor a common or public carrier. 3. [facility] has no control over the quality or safety of the transportation that occurs as a result of the [employer]. 4. [facility] cannot guarantee that any third-party transportation provider will complete any arranged transportation service. 5. [facility] makes no representation, warranty, or guarantee regarding the reliability, timeliness, quality, safety, suitability, or availability of any of the Transport Services or that they will be error free. 6. I fully understand that traveling by vehicle involves risks and dangers of serious bodily injury, including permanent disability, paralysis, and death. I agree, on behalf of myself and on behalf of any minor child using the Transport Services for whom I am the parent or legal guardian, that the entire risk arising out of my use of the [employer] remains solely with me, to the maximum extent permitted under applicable law. 7. The [employer]  are provided "as is" and "as available." [facility] disclaims all representations and warranties, express, implied or statutory, not expressly set out in these terms, including the implied warranties of merchantability and fitness for a particular purpose. 8. I hereby waive and release [place], its agents, employees, officers, directors, representatives, insurers, attorneys, assigns, successors, subsidiaries, and affiliates from any and all past, present, or future claims, demands, liabilities, actions, causes of action, or suits of any kind directly or indirectly arising from acceptance and use of the [employer]. 9. I further waive and release  and its affiliates from all present and future liability and responsibility for any injury or death to persons or damages to property caused by or related to the use of the [employer]. 10. I have read this Waiver and Release of Liability, and I understand the terms used in it and their legal significance. This Waiver is freely and voluntarily given with the understanding that my right (as well as the right of any minor child for whom I am the parent or legal guardian using the [employer]) to legal recourse against  in connection with the [employer] is knowingly surrendered in return for use of these services.   I attest that I read the consent document to [name], gave Ms. Sancho the opportunity to ask questions and answered the questions asked (if any). I affirm that [name] then provided consent for she's participation in this program.     Felicia Warren

## 2020-07-25 NOTE — Plan of Care (Signed)
  Problem: Health Behavior/Discharge Planning: Goal: Ability to manage health-related needs will improve 07/25/2020 1331 by Charmian Muff, RN Outcome: Adequate for Discharge 07/25/2020 1330 by Charmian Muff, RN Outcome: Progressing   Problem: Clinical Measurements: Goal: Ability to maintain clinical measurements within normal limits will improve 07/25/2020 1331 by Charmian Muff, RN Outcome: Adequate for Discharge 07/25/2020 1330 by Charmian Muff, RN Outcome: Progressing Goal: Will remain free from infection 07/25/2020 1331 by Charmian Muff, RN Outcome: Adequate for Discharge 07/25/2020 1330 by Charmian Muff, RN Outcome: Progressing Goal: Diagnostic test results will improve 07/25/2020 1331 by Charmian Muff, RN Outcome: Adequate for Discharge 07/25/2020 1330 by Charmian Muff, RN Outcome: Progressing Goal: Respiratory complications will improve 07/25/2020 1331 by Charmian Muff, RN Outcome: Adequate for Discharge 07/25/2020 1330 by Charmian Muff, RN Outcome: Progressing Goal: Cardiovascular complication will be avoided 07/25/2020 1331 by Charmian Muff, RN Outcome: Adequate for Discharge 07/25/2020 1330 by Charmian Muff, RN Outcome: Progressing   Problem: Activity: Goal: Risk for activity intolerance will decrease 07/25/2020 1331 by Charmian Muff, RN Outcome: Adequate for Discharge 07/25/2020 1330 by Charmian Muff, RN Outcome: Progressing   Problem: Nutrition: Goal: Adequate nutrition will be maintained 07/25/2020 1331 by Charmian Muff, RN Outcome: Adequate for Discharge 07/25/2020 1330 by Charmian Muff, RN Outcome: Progressing   Problem: Coping: Goal: Level of anxiety will decrease 07/25/2020 1331 by Charmian Muff, RN Outcome: Adequate for Discharge 07/25/2020 1330 by Charmian Muff, RN Outcome: Progressing   Problem: Elimination: Goal: Will not experience complications related to bowel motility 07/25/2020 1331 by Charmian Muff, RN Outcome: Adequate for Discharge 07/25/2020 1330 by Charmian Muff, RN Outcome: Progressing Goal: Will not experience complications related to urinary retention 07/25/2020 1331 by Charmian Muff, RN Outcome: Adequate for Discharge 07/25/2020 1330 by Charmian Muff, RN Outcome: Progressing   Problem: Pain Managment: Goal: General experience of comfort will improve 07/25/2020 1331 by Charmian Muff, RN Outcome: Adequate for Discharge 07/25/2020 1330 by Charmian Muff, RN Outcome: Progressing   Problem: Safety: Goal: Ability to remain free from injury will improve 07/25/2020 1331 by Charmian Muff, RN Outcome: Adequate for Discharge 07/25/2020 1330 by Charmian Muff, RN Outcome: Progressing   Problem: Skin Integrity: Goal: Risk for impaired skin integrity will decrease 07/25/2020 1331 by Charmian Muff, RN Outcome: Adequate for Discharge 07/25/2020 1330 by Charmian Muff, RN Outcome: Progressing

## 2020-07-25 NOTE — TOC Progression Note (Signed)
Transition of Care [facility]) - Progression Note    Patient Details  Name: Felicia Warren MRN: [id number] Date of Birth: [birth year]  [facility] CM/SW Contact  Geni Bers, RN Phone Number: 07/25/2020, 1:12 PM  Clinical Narrative:     Spoke with pt concerning [facility]. Also explained Center will help with transportation to appointment. DME RW, Oxygen was delivered. Bipap will be delivered at 2:30PM by [employer] [facility].   Expected Discharge Plan: Homeless Shelter Barriers to Discharge: Continued Medical Work up  Expected Discharge Plan and Services Expected Discharge Plan: Homeless Shelter       Living arrangements for the past 2 months: Hotel/Motel Expected Discharge Date: 07/25/20                                     Social Determinants of Health (SDOH) Interventions    Readmission Risk Interventions No flowsheet data found.

## 2020-07-25 NOTE — Progress Notes (Signed)
Spoke with [place] concerning [facility] transportation system, will call and clarify transportation of pt equipment to place of resident. Will Will follow up. SRP, RN

## 2020-07-25 NOTE — Progress Notes (Signed)
Spoke with Migdalia Dk from [employer] system with transport pt and equipment to [employer] on [address]. Pt and sister and Care Management all aware of discharge process and delivery of equipment to pt residence from the Hospital.  [facility] equipment will transport pt at 5:00pm, sister will wait at the hotel for pt arrival. Everyone acknowledge understanding. Questions addressed and answered. SRP, RN

## 2020-07-30 ENCOUNTER — Telehealth: Payer: Self-pay

## 2020-07-30 ENCOUNTER — Ambulatory Visit (INDEPENDENT_AMBULATORY_CARE_PROVIDER_SITE_OTHER): Payer: Self-pay | Admitting: Nurse Practitioner

## 2020-07-30 VITALS — BP 133/77 | HR 118 | Temp 98.6°F | Resp 18

## 2020-07-30 DIAGNOSIS — J9602 Acute respiratory failure with hypercapnia: Secondary | ICD-10-CM

## 2020-07-30 DIAGNOSIS — J9601 Acute respiratory failure with hypoxia: Secondary | ICD-10-CM

## 2020-07-30 DIAGNOSIS — I248 Other forms of acute ischemic heart disease: Secondary | ICD-10-CM

## 2020-07-30 DIAGNOSIS — I2699 Other pulmonary embolism without acute cor pulmonale: Secondary | ICD-10-CM

## 2020-07-30 NOTE — Progress Notes (Signed)
@Patient  ID: , female    DOB: 1971-01-25, 50 y.o.   MRN: [id number]  Chief Complaint  Patient presents with   Hospitalization Follow-up    Referring provider: No ref. provider found     HPI  Patient presents today for transition of care visit.  Patient was admitted to the hospital on 06/25/2020 and discharged on 07/25/2020.  Overall she has been doing well since hospital discharge.  Patient does not currently have a PCP and we will set up appointment for establish care with a new PCP during visit today.  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 - Advised to continue BiPAP nightly - compliant    Pulmonary embolism -CT angiogram chest from 06/30/2020 with an area suspicious for segmental thrombus in the left lower lobe associated airspace disease and probably likely pulmonary infarct. -Images concerning for right heart strain. -Will need referral to cardiology for follow up - will place referral today   AKI with unknown baseline resolved/AG MA/lactic acidosis resolved/contraction alkalosis -Renal function improved. -Diamox was discontinued. -Avoid nephrotoxic agents.   Hypokalemia -Secondary to diuresis.   -Continue oral daily potassium 40 mEq daily.   -follow up labs with new PCP   Demand ischemia -Troponin noted to be downtrended with oxygenation. -2D echo with EF of 55 to 60%, NWMA, McConnell sign with moderately enlarged RV intraventricular septal flattening concerning for RV overload (likely secondary to OHS). -Suspected left lower lobe  segmental PE as above. -Continue Xarelto -Seen by cardiology during hospitalization - it was noted that patient may benefit from outpatient right heart cath. -We will place referral to cardiology   Resolved Transaminitis - resolved -?  Etiology.  Possible shock liver. -Acute hepatitis panel negative. -RUQ ultrasound with cholelithiasis and negative for cholecystitis.   Hypoglycemia - resolved -On admission to have glucose of 40s. -Resolved with dextrose infusion on admission.     Diarrhea, resolved -Likely in the setting of tube feeds and bowel regimen   Normocytic anemia/anemia of chronic disease -Stable.   CBC in follow up with PCP   Social barriers -patient from 08/30/2020 and uninsured; her wish is to return to [place] to collect belongings and move to [place] as she has family here, however she has nowhere here to live yet if she moved - if she's needing nightly BIPAP, that will be a factor; and/or if she needs continuous O2 that will also be a factor. -Mobility is improving - will set up appointment to meet with social worker    Morbid obesity  Patient BMI: Body mass index is 63.84 kg/m.  Complicates overall care and prognosis.   Recommend lifestyle modifications including physical activity and diet for weight loss and overall long-term health.   Denies f/c/s, n/v/d, hemoptysis, PND, chest pain or edema     No Known Allergies   There is no immunization history on file for this patient.  No past medical history on file.  Tobacco History: Social History   Tobacco Use  Smoking Status Never  Smokeless Tobacco Never   Counseling given: Yes   Outpatient Encounter Medications as of 07/30/2020  Medication Sig   Ensure Max Protein (ENSURE MAX PROTEIN) LIQD Take 330 mLs (11 oz total) by mouth daily.   magnesium oxide (MAG-OX) 400 (240 Mg) MG tablet Take 2 tablets (800 mg total) by mouth 2 (two) times daily.   Multiple Vitamin (MULTIVITAMIN WITH MINERALS) TABS tablet  Take 1 tablet by mouth daily.   pantoprazole (PROTONIX) 40 MG tablet Take 1 tablet (40 mg total) by mouth at bedtime.   potassium chloride SA (KLOR-CON) 20 MEQ tablet Take 2 tablets (40 mEq total) by mouth daily.   rivaroxaban (XARELTO) 20 MG TABS tablet Take 1 tablet (20 mg total) by mouth daily with supper.   torsemide 40 MG TABS Take 40 mg by mouth 2 (two) times daily.   No facility-administered encounter medications on file as of 07/30/2020.     Review of Systems  Review of Systems  Constitutional: Negative.  Negative for fatigue.  HENT: Negative.    Respiratory:  Negative for cough and shortness of breath.   Cardiovascular: Negative.   Gastrointestinal: Negative.   Allergic/Immunologic: Negative.   Neurological: Negative.   Psychiatric/Behavioral: Negative.        Physical Exam  BP 133/77   Pulse (!) 118   Temp 98.6 F (37 C)   Resp 18   SpO2 95%   Wt Readings from Last 5 Encounters:  07/25/20 (!) 393 lb 1.3 oz (178.3 kg)     Physical Exam Vitals and nursing note reviewed.  Constitutional:      General: She is not in acute distress.    Appearance: She is well-developed.  Cardiovascular:     Rate and Rhythm: Normal rate and regular rhythm.  Pulmonary:     Effort: Pulmonary effort is normal.     Breath sounds: Normal breath sounds.  Neurological:     Mental Status: She is alert and oriented to person, place, and time.      Assessment & Plan:   Acute respiratory failure with hypoxia and hypercarbia (HCC) Requires BiPAP nightly - home machine obtained -O2 as needed with goal sats 88 to 92% -Continue torsemide -Will need outpatient sleep study.   -Will refer to pulmonary.    Acute metabolic encephalopathy - resolved -BiPAP nightly     Pulmonary embolism -CT angiogram chest from 06/30/2020 with an area suspicious for segmental thrombus in the left lower lobe associated airspace disease and probably likely pulmonary infarct. -Images concerning for right  heart strain. -Continue Xarelto.    Hypokalemia -Secondary to diuresis.   -Continue oral daily potassium 40 mEq daily.   -PCP follow up for labs and any refills.   Demand ischemia -Troponin noted to be downtrended with oxygenation. -2D echo with EF of 55 to 60%, NWMA, McConnell sign with moderately enlarged RV intraventricular septal flattening concerning for RV overload (likely secondary to OHS). -Suspected left lower lobe segmental PE as above. -Continue Xarelto. -Continue Demadex. -Seen by cardiology and patient may benefit from outpatient right heart cath. -Referral placed to cardiology   Resolved Transaminitis - resolved -Acute hepatitis panel negative. -RUQ ultrasound with cholelithiasis and negative for cholecystitis.     Normocytic anemia/anemia of chronic disease -Transfusion threshold hemoglobin < 7. Stable.  CBC in follow up.   Social barriers -will need appointment with social worker at [facility]    Morbid obesity  Patient BMI: Body mass index is 63.84 kg/m.  Recommend lifestyle modifications including physical activity and diet for weight loss and overall long-term health.    Appointment scheduled  to establish care with new provider at [facility]     Ivonne Andrew, NP 08/04/2020

## 2020-07-30 NOTE — Patient Instructions (Addendum)
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  -Will refer to pulmonary.   Acute metabolic encephalopathy - resolved -BiPAP nightly   Pulmonary embolism -CT angiogram chest from 06/30/2020 with an area suspicious for segmental thrombus in the left lower lobe associated airspace disease and probably likely pulmonary infarct. -Images concerning for right heart strain. -Continue Xarelto.  Hypokalemia -Secondary to diuresis.  -Continue oral daily potassium 40 mEq daily.  -PCP follow up for labs and any refills.  Demand ischemia -Troponin noted to be downtrended with oxygenation. -2D echo with EF of 55 to 60%, NWMA, McConnell sign with moderately enlarged RV intraventricular septal flattening concerning for RV overload (likely secondary to OHS). -Suspected left lower lobe segmental PE as above. -Continue Xarelto. -Continue Demadex. -Seen by cardiology and patient may benefit from outpatient right heart cath. -Referral placed to cardiology  ResolvedTransaminitis - resolved -Acute hepatitis panel negative. -RUQ ultrasound with cholelithiasis and negative for cholecystitis.   Normocytic anemia/anemia of chronic disease -Transfusion threshold hemoglobin < 7. Stable.  CBC in follow up.  Social barriers -will need appointment with social worker at [facility]  Morbid obesity Patient BHA:LPFX mass index is 63.84 kg/m. Recommend lifestyle modifications including physical activity and diet for weight loss and overall long-term health.   Appointment scheduled to establish care with new provider at [facility]

## 2020-07-31 NOTE — Telephone Encounter (Signed)
Opened in error

## 2020-08-04 NOTE — Assessment & Plan Note (Signed)
Requires BiPAP nightly - home machine obtained -O2 as needed with goal sats 88 to 92% -Continue torsemide -Will need outpatient sleep study.   -Will refer to pulmonary.    Acute metabolic encephalopathy - resolved -BiPAP nightly     Pulmonary embolism -CT angiogram chest from 06/30/2020 with an area suspicious for segmental thrombus in the left lower lobe associated airspace disease and probably likely pulmonary infarct. -Images concerning for right heart strain. -Continue Xarelto.    Hypokalemia -Secondary to diuresis.   -Continue oral daily potassium 40 mEq daily.   -PCP follow up for labs and any refills.   Demand ischemia -Troponin noted to be downtrended with oxygenation. -2D echo with EF of 55 to 60%, NWMA, McConnell sign with moderately enlarged RV intraventricular septal flattening concerning for RV overload (likely secondary to OHS). -Suspected left lower lobe segmental PE as above. -Continue Xarelto. -Continue Demadex. -Seen by cardiology and patient may benefit from outpatient right heart cath. -Referral placed to cardiology   Resolved Transaminitis - resolved -Acute hepatitis panel negative. -RUQ ultrasound with cholelithiasis and negative for cholecystitis.     Normocytic anemia/anemia of chronic disease -Transfusion threshold hemoglobin < 7. Stable.  CBC in follow up.   Social barriers -will need appointment with social worker at [facility]    Morbid obesity  Patient BMI: Body mass index is 63.84 kg/m.  Recommend lifestyle modifications including physical activity and diet for weight loss and overall long-term health.    Appointment scheduled to establish care with new provider at [facility]

## 2020-09-04 ENCOUNTER — Inpatient Hospital Stay: Payer: Self-pay | Admitting: Internal Medicine

## 2020-09-08 ENCOUNTER — Other Ambulatory Visit: Payer: Self-pay

## 2020-09-08 ENCOUNTER — Ambulatory Visit (INDEPENDENT_AMBULATORY_CARE_PROVIDER_SITE_OTHER): Payer: Self-pay | Admitting: Pulmonary Disease

## 2020-09-08 ENCOUNTER — Encounter: Payer: Self-pay | Admitting: Pulmonary Disease

## 2020-09-08 VITALS — BP 132/88 | HR 111 | Ht 66.0 in | Wt 389.2 lb

## 2020-09-08 DIAGNOSIS — I2699 Other pulmonary embolism without acute cor pulmonale: Secondary | ICD-10-CM

## 2020-09-08 DIAGNOSIS — J9612 Chronic respiratory failure with hypercapnia: Secondary | ICD-10-CM

## 2020-09-08 DIAGNOSIS — Z79899 Other long term (current) drug therapy: Secondary | ICD-10-CM

## 2020-09-08 NOTE — Patient Instructions (Signed)
Nice to meet you  I have ordered a sleep study to make sure that you are getting the correct support with your machine at night.  I have ordered an ultrasound to make sure that stress is improved from the blood clot in your lungs.  I have ordered lab work today to make sure your potassium levels are okay on your diuretic.  Return to clinic in 3 months or sooner as needed with Dr. Judeth Horn.

## 2020-09-08 NOTE — Progress Notes (Signed)
@Patient  ID: , female    DOB: [birth year], 50 y.o.   MRN: [id number]  Chief Complaint  Patient presents with   Consult    Referred by [id number] at [facility] for history of resp failure and pulmonary embolism. States she has been doing well since her last visit with Angus Seller. Denies being on any oxygen.     Referring provider: [place], NP  HPI:   50 year old woman whom we are seeing in consultation for evaluation of pulmonary embolus.  Referring provider note reviewed.  H&P and discharge summary from hospitalization May/June 2022 reviewed.  Patient was admitted after being found down in a motel room.  PCO2 elevated.  CT scan on admission demonstrated pulmonary embolus.  No clear provoking factor.  Largest risk factor is morbid obesity.  She was placed on BiPAP.  Gradually improved with BiPAP therapy and treatment of PE.  TTE revealed RV dysfunction.  Discharged with a BiPAP machine.  She continues to use this.  She was directed to the take blood thinners for at least 3 months.  She stopped this about a month ago, after only about 2 months of therapy.  She states it was too expensive.  States she did not have refills.  Appears refills were available.  She continues take Lasix.  She was noted to be volume overloaded in the hospital.  She stopped her potassium supplementation.  Today she feels okay.  A bit better than in the hospital.  Still some dyspnea on exertion.  Reviewed CT images 06/2020 That Demonstrated Low Lung Volumes, Scattered Streaky Infiltrates in the Bases Most Likely Atelectasis with Scattered Peribronchovascular Groundglass Opacities on My Interpretation.  Most Recent Chest Imaging 07/05/2020 Is a Chest X-Ray Which Demonstrates Low Lung Volumes, Habitus Precludes Accurate Interpretation but Appears to Have Bilateral Hilar to Midlung Field Infiltrates Most Likely Consistent with Pulmonary Edema on My Interpretation.  PMH: Morbid obesity, volume overload, PE,  OSA/OHS Surgical history: History reviewed. No pertinent surgical history. Family history:History reviewed. No pertinent family history. Social history: Recently moved from 07/07/2020, lives in [place], never smoker  Questionaires / Pulmonary Flowsheets:   ACT:  No flowsheet data found.  MMRC: No flowsheet data found.  Epworth:  No flowsheet data found.  Tests:   FENO:  No results found for: NITRICOXIDE  PFT: No flowsheet data found.  WALK:  No flowsheet data found.  Imaging: Personally reviewed and as per EMR  Lab Results: Personally reviewed CBC    Component Value Date/Time   WBC 6.8 07/22/2020 0358   RBC 4.69 07/22/2020 0358   HGB 11.2 (L) 07/22/2020 0358   HCT 39.6 07/22/2020 0358   PLT 285 07/22/2020 0358   MCV 84.4 07/22/2020 0358   MCH 23.9 (L) 07/22/2020 0358   MCHC 28.3 (L) 07/22/2020 0358   RDW 19.3 (H) 07/22/2020 0358   LYMPHSABS 1.0 07/16/2020 0406   MONOABS 0.4 07/16/2020 0406   EOSABS 0.1 07/16/2020 0406   BASOSABS 0.0 07/16/2020 0406    BMET    Component Value Date/Time   NA 137 07/22/2020 0358   K 3.8 07/22/2020 0358   CL 96 (L) 07/22/2020 0358   CO2 31 07/22/2020 0358   GLUCOSE 99 07/22/2020 0358   BUN 16 07/22/2020 0358   CREATININE 1.01 (H) 07/22/2020 0358   CALCIUM 8.6 (L) 07/22/2020 0358   GFRNONAA >60 07/22/2020 0358    BNP    Component Value Date/Time   BNP 472.5 (H) 07/04/2020 0424    ProBNP  No results found for: PROBNP  Specialty Problems       Pulmonary Problems   Acute respiratory failure with hypoxia and hypercarbia (HCC)   Community acquired pneumonia   Hypercapnic respiratory failure (HCC)    No Known Allergies   There is no immunization history on file for this patient.  Past Medical History:  Diagnosis Date   AKI (acute kidney injury) (HCC)    Demand ischemia (HCC)    Diarrhea    Hypoglycemia    Hypokalemia    Morbid obesity (HCC)    Normocytic anemia    OSA treated with BiPAP     Pulmonary embolism (HCC)    Transaminitis     Tobacco History: Social History   Tobacco Use  Smoking Status Never  Smokeless Tobacco Never   Counseling given: Not Answered   Continue to not smoke  Outpatient Encounter Medications as of 09/08/2020  Medication Sig   Multiple Vitamin (MULTIVITAMIN WITH MINERALS) TABS tablet Take 1 tablet by mouth daily.   pantoprazole (PROTONIX) 40 MG tablet Take 1 tablet (40 mg total) by mouth at bedtime.   potassium chloride SA (KLOR-CON) 20 MEQ tablet Take 2 tablets (40 mEq total) by mouth daily.   rivaroxaban (XARELTO) 20 MG TABS tablet Take 1 tablet (20 mg total) by mouth daily with supper.   torsemide 40 MG TABS Take 40 mg by mouth 2 (two) times daily.   [DISCONTINUED] Ensure Max Protein (ENSURE MAX PROTEIN) LIQD Take 330 mLs (11 oz total) by mouth daily.   [DISCONTINUED] magnesium oxide (MAG-OX) 400 (240 Mg) MG tablet Take 2 tablets (800 mg total) by mouth 2 (two) times daily.   No facility-administered encounter medications on file as of 09/08/2020.     Review of Systems  Review of Systems  Denies chest pain with exertion.  No orthopnea or PND.  Lower extremity swelling improved per her report.  Comprehensive review of systems otherwise negative. Physical Exam  BP 132/88   Pulse (!) 111   Ht 5\' 6"  (1.676 m)   Wt (!) 389 lb 3.2 oz (176.5 kg)   SpO2 98% Comment: on RA  BMI 62.82 kg/m   Wt Readings from Last 5 Encounters:  09/08/20 (!) 389 lb 3.2 oz (176.5 kg)  07/25/20 (!) 393 lb 1.3 oz (178.3 kg)    BMI Readings from Last 5 Encounters:  09/08/20 62.82 kg/m  07/25/20 63.44 kg/m     Physical Exam General: Obese, no acute distress Eyes: EOMI, no icterus Neck: Supple, no JVP appreciated, habitus makes it difficult to fully assess Cardiovascular: Regular in rhythm, no murmur Pulmonary: Distant, clear to auscultation bilaterally, no wheeze, no work of breathing Abdomen: Nondistended, bowel sounds present Psych: Normal mood,  full affect MSK: No synovitis, joint effusion Neuro: Ambulates with cane, no weakness   Assessment & Plan:   OHS with likely OSA: Initially presented renal failure and low bicarb but subsequently has demonstrated chronically elevated bicarbonate.  Serial blood gases with elevated PCO2 in the 60s.  Wearing BiPAP at night.  Continue BiPAP for now.  Polysomnography needed now at steady state.  This is ordered.  Volume overload: CT scan on admission 06/2020 with what appears to be pulm edema and small bilateral pleural effusions.  Likely the setting of sleep disordered breathing stress on the heart.  Also with positive McConnell sign so likely signs of right heart failure at that time.  On torsemide.  Status weight and volume status is improved.  No longer taking potassium  supplements.  BMP today to evaluate renal function, potassium level without supplements.  Pulmonary embolus: 06/2020.  McConnell sign on echocardiogram.  At risk for chronic RV dysfunction in the setting of OHS/OSA.  Only completed 2 months of Xarelto due to cost.  Feeling better.  This is reassuring.  TTE in the next few days to evaluate for improvement in McConnell sign.   Return in about 3 months (around 12/09/2020).   Karren Burly, MD 09/08/2020   This appointment required 85 minutes of patient care (this includes precharting, chart review, review of results, face-to-face care, etc.).

## 2020-09-09 LAB — BASIC METABOLIC PANEL
BUN: 9 mg/dL (ref 6–23)
CO2: 29 mEq/L (ref 19–32)
Calcium: 8.2 mg/dL — ABNORMAL LOW (ref 8.4–10.5)
Chloride: 96 mEq/L (ref 96–112)
Creatinine, Ser: 0.86 mg/dL (ref 0.40–1.20)
GFR: 77.93 mL/min (ref >60.00)
Glucose, Bld: 105 mg/dL — ABNORMAL HIGH (ref 70–99)
Potassium: 3 mEq/L — ABNORMAL LOW (ref 3.5–5.1)
Sodium: 140 mEq/L (ref 135–145)

## 2020-09-09 MED ORDER — POTASSIUM CHLORIDE CRYS ER 20 MEQ PO TBCR: 40.0000 meq | EXTENDED_RELEASE_TABLET | Freq: Every day | ORAL | 11 refills | Status: DC

## 2020-09-09 NOTE — Progress Notes (Signed)
Potassium a bit low at 3.0. She needs to resume the potassium chloride 40 meq daily. New prescription sent.

## 2020-09-12 ENCOUNTER — Telehealth: Payer: Self-pay | Admitting: Pulmonary Disease

## 2020-09-12 NOTE — Telephone Encounter (Signed)
Called and spoke with patient, she is concerned about having the [place] next week.  She said she is not financially in a position where she can afford to have that done right now and would not be able to pay anything at the time that the test is done.  She wants to know if this absolutely has to be done next week or is this something that can wait.  Dr. Judeth Horn, Patient wants to know if the [place] can wait until she is in a better place financially or is this something that has to be done next week.  Please advise.  Thank you.

## 2020-09-12 NOTE — Telephone Encounter (Signed)
I would recommend the echo be done as soon as possible. I understand she is not in a place to do so financially. But I can not recommend a delay. If she can not do it financially, can she be put in touch with [facility] financial counseling?

## 2020-09-12 NOTE — Telephone Encounter (Signed)
ATC x1, left vm to return call. 

## 2020-09-16 ENCOUNTER — Other Ambulatory Visit: Payer: Self-pay | Admitting: Pulmonary Disease

## 2020-09-16 DIAGNOSIS — J9601 Acute respiratory failure with hypoxia: Secondary | ICD-10-CM

## 2020-09-16 DIAGNOSIS — I2609 Other pulmonary embolism with acute cor pulmonale: Secondary | ICD-10-CM

## 2020-09-16 NOTE — Telephone Encounter (Signed)
Echo order was placed on 09/08/20. Order does look active. Can we scheduled it?

## 2020-09-16 NOTE — Telephone Encounter (Addendum)
There is not an order for an echo to be scheduled & don't see where one is scheduled next week??  [facility] does have a financial assistance program.  Anyone without insurance automatically gets a certain percentage write-off.  Financial Assistance phone # is [phone].  Will need order to schedule echo.

## 2020-09-16 NOTE — Telephone Encounter (Signed)
I have put in the order for the echo. When I put it in, it stated there was already an echo order from Dr. Judeth Horn so there should be 2 orders for echo now. Please let us know if you cannot see it. thanks

## 2020-09-16 NOTE — Telephone Encounter (Signed)
Called and spoke to pt. Informed her of the information per Dr. Judeth Horn. Echo is not scheduled yet.    PCC's do you know of any financial hardship leniency for procedures with [facility]?

## 2020-09-16 NOTE — Telephone Encounter (Signed)
It looks like the echo order was cancelled.  A new order will need to be placed in order for this to be scheduled.

## 2020-09-17 ENCOUNTER — Encounter: Payer: Self-pay | Admitting: Critical Care Medicine

## 2020-09-17 ENCOUNTER — Other Ambulatory Visit: Payer: Self-pay

## 2020-09-17 ENCOUNTER — Telehealth: Payer: Self-pay

## 2020-09-17 ENCOUNTER — Ambulatory Visit: Payer: Self-pay | Attending: Critical Care Medicine | Admitting: Critical Care Medicine

## 2020-09-17 VITALS — BP 134/97 | HR 105 | Resp 22 | Ht 64.0 in | Wt 391.0 lb

## 2020-09-17 DIAGNOSIS — Z6841 Body Mass Index (BMI) 40.0 and over, adult: Secondary | ICD-10-CM | POA: Insufficient documentation

## 2020-09-17 DIAGNOSIS — J9602 Acute respiratory failure with hypercapnia: Secondary | ICD-10-CM

## 2020-09-17 DIAGNOSIS — J189 Pneumonia, unspecified organism: Secondary | ICD-10-CM

## 2020-09-17 DIAGNOSIS — E876 Hypokalemia: Secondary | ICD-10-CM

## 2020-09-17 DIAGNOSIS — I2699 Other pulmonary embolism without acute cor pulmonale: Secondary | ICD-10-CM

## 2020-09-17 DIAGNOSIS — R7303 Prediabetes: Secondary | ICD-10-CM

## 2020-09-17 NOTE — Telephone Encounter (Addendum)
Followed up with patient after speaking with PCP regarding housing and current living situation.   Before moving back to [place] the patient resided in [place] and worked in the [employer].  Patient shared they have been applying for jobs and hope to hear something soon. Patient recently got approved for food stamps also.   Patient shared they are currently staying at the [facility] off [address]/ [employer] and her weekly rent is about $600.00 a week and only has enough money left for a few weeks. She is able to receive financial assistance from her family. Currently patient doesn't have an income but in the process of filing for disability/ SSI with assistance from the hospital.   CM shared with the patient options for local shelters in the area. Also that without a current income, options are limited for single occupancy housing.

## 2020-09-17 NOTE — Patient Instructions (Signed)
Labs today include metabolic panel D-dimer to check for blood clotting and cholesterol panel  Keep your appointment with cardiology and the ultrasound of the heart  Our case manager discussed with you homeless resources  No change in medications for now, hold off on Xarelto for now  Follow a healthy diet as we discussed  You declined the tetanus shot and pneumonia vaccine  We gave you resources to obtain a mammogram and cervical cancer screening for free please hold onto those resources  Pick up financial application on the way out the front desk has a copy obtain all necessary documents and then call back to achieve an appointment with our financial counselor Mikle Bosworth that she can get the orange card at which time we can then send you to a dentist and also give you a sleep study  Return to see Dr. Delford Field 2 months

## 2020-09-17 NOTE — Assessment & Plan Note (Signed)
A1C was 6.3 in hospital. Lipid panel ordered through the lab for screening. Patient has food stamps and was encouraged to do her best to follow a healthy diet with lots of vegetables and lean meats.

## 2020-09-17 NOTE — Telephone Encounter (Signed)
Order placed and has already been documented by cardiology pt was called this morning to schedule and they had to leave vm.  Nothing further needed.

## 2020-09-17 NOTE — Progress Notes (Signed)
New Patient Office Visit  Subjective:  Patient ID: Felicia Warren, female    DOB: 1970-12-24  Age: 50 y.o. MRN: [id number]  CC:  Chief Complaint  Patient presents with   Establish Care   Follow-up    HPI [name] presents for a new patient consultation to follow up from a recent ER visit and hospitalization for hypoxic hypercarbic respiratory failure, right sided heart failure, left lower lobe pulmonary embolism, AKI and hypokalemia. She is currently living in an extended stay hotel room and does not have permanent housing, health insurance, employment or transportation. She recently moved from [place] but is from [place] originally. She has been following up with her pulmonologist since her hospitalization and tomorrow 09/18/20 she has an appointment with cardiology and a repeat echo. She took 1 month of her Xarelto after her hospitalization but due to financial situation did not refill. Her pulmonologist is aware that she is off her anticoagulant. She continues to sleep with her bipap machine each night but does not have a pulse oximetry at home. She is also due for a sleep study but is limited by finances.   She says that since her hospitalization she is doing well. She denies fevers, chills, chest pains, SOB, palpitations, cough or leg swelling. She states when she was in college, she broke her right lower leg/ ankle and since then, her right leg is often swollen but denies edema today. She had a bilateral doppler US in the hospital which was negative for DVT. No history of cancer. She does not smoke tobacco products or use illicit substances. She drinks alcohol on occasions or 1-2 times per month.    Repeat BMP on 07/18 reveals a potassium of 3.0 and she was started back on a oral supplement. She is taking 2 tablets BID of 20 mEq. Also continues to take 2 torsemide 20mg  BID.    Past Medical History:  Diagnosis Date   Acute respiratory failure with hypoxia and hypercarbia (HCC)     AKI (acute kidney injury) (HCC)    Community acquired pneumonia    Demand ischemia (HCC)    Diarrhea    Hypercapnic respiratory failure (HCC) 06/25/2020   Hypoglycemia    Hypokalemia    Morbid obesity (HCC)    Normocytic anemia    OSA treated with BiPAP    Pulmonary embolism (HCC)    Transaminitis     History reviewed. No pertinent surgical history.  History reviewed. No pertinent family history.  Social History   Socioeconomic History   Marital status: Single    Spouse name: Not on file   Number of children: Not on file   Years of education: Not on file   Highest education level: Not on file  Occupational History   Not on file  Tobacco Use   Smoking status: Never   Smokeless tobacco: Never  Vaping Use   Vaping Use: Never used  Substance and Sexual Activity   Alcohol use: Not Currently   Drug use: Not Currently   Sexual activity: Not Currently    Birth control/protection: None  Other Topics Concern   Not on file  Social History Narrative   Not on file   Social Determinants of Health   Financial Resource Strain: Not on file  Food Insecurity: Not on file  Transportation Needs: Not on file  Physical Activity: Not on file  Stress: Not on file  Social Connections: Not on file  Intimate Partner Violence: Not on file  ROS Review of Systems  Constitutional:  Negative for chills, fatigue and fever.  HENT: Negative.    Eyes: Negative.   Respiratory:  Negative for cough, chest tightness, shortness of breath and wheezing.   Cardiovascular:  Negative for chest pain, palpitations and leg swelling.  Gastrointestinal:  Negative for abdominal distention, abdominal pain, diarrhea, nausea and vomiting.  Genitourinary: Negative.   Musculoskeletal: Negative.   Neurological: Negative.   Psychiatric/Behavioral: Negative.     Objective:   Today's Vitals: BP (!) 134/97 (BP Location: Left Arm, Patient Position: Sitting, Cuff Size: Large)   Pulse (!) 105   Resp (!) 22    Ht 5\' 4"  (1.626 m)   Wt (!) 391 lb (177.4 kg)   LMP  (LMP Unknown)   SpO2 95%   BMI 67.11 kg/m   Physical Exam Constitutional:      General: She is not in acute distress.    Appearance: Normal appearance. She is obese. She is not toxic-appearing or diaphoretic.  HENT:     Head: Normocephalic and atraumatic.     Mouth/Throat:     Mouth: Mucous membranes are moist.     Pharynx: Oropharynx is clear.  Eyes:     Pupils: Pupils are equal, round, and reactive to light.  Cardiovascular:     Rate and Rhythm: Normal rate and regular rhythm.     Pulses: Normal pulses.     Heart sounds: Normal heart sounds.  Pulmonary:     Effort: Pulmonary effort is normal.     Breath sounds: Normal breath sounds.  Abdominal:     General: Bowel sounds are normal.     Palpations: Abdomen is soft.  Musculoskeletal:     Cervical back: Neck supple.  Skin:    General: Skin is warm and dry.  Neurological:     General: No focal deficit present.     Mental Status: She is alert.  Psychiatric:        Mood and Affect: Mood normal.        Behavior: Behavior normal.    Assessment & Plan:   Problem List Items Addressed This Visit       Cardiovascular and Mediastinum   Pulmonary emboli (HCC) - Primary    From most recent hospitalization in May. She took 1 month of xarelto and did not refill due to cost. Has been following up with pulmonologist who is aware of anticoagulant status. She is scheduled for a repeat TTE and cardiology visit tomorrow. Stable, benign exam and patient not in distress. Will draw a D-dimer today through lab and f/u on echo study before resuming Xarelto which she can obtain at our pharmacy for free with PASS program       Relevant Medications   torsemide (DEMADEX) 20 MG tablet   Other Relevant Orders   D-dimer, quantitative     Respiratory   RESOLVED: Hypercapnic respiratory failure (HCC)    This has resolved but still bipap dependent       RESOLVED: Community acquired  pneumonia    This has resolved         Other   BMI 60.0-69.9, adult (HCC)    Greatly affected by financial status, living situation and recent hospitalization. Set up with Bipap at night. She is due for a sleep study after she applies for orange card and financial assistance. OHS likely contributing to sleep apnea.         Prediabetes    A1C was 6.3 in hospital. Lipid panel ordered  through the lab for screening. Patient has food stamps and was encouraged to do her best to follow a healthy diet with lots of vegetables and lean meats.        Relevant Orders   Lipid panel   RESOLVED: Hypokalemia    K+ on 07/18 was 3.0 and was restarted on K supplement , 2 tab BID. Plan to redraw BMP today to eval electrolytes. Also on torsemide        Relevant Orders   Basic Metabolic Panel    Outpatient Encounter Medications as of 09/17/2020  Medication Sig   Multiple Vitamin (MULTIVITAMIN WITH MINERALS) TABS tablet Take 1 tablet by mouth daily.   pantoprazole (PROTONIX) 40 MG tablet Take 1 tablet (40 mg total) by mouth at bedtime.   potassium chloride SA (KLOR-CON) 20 MEQ tablet Take 2 tablets (40 mEq total) by mouth daily. (Patient taking differently: Take 40 mEq by mouth 2 (two) times daily.)   torsemide (DEMADEX) 20 MG tablet Take 40 mg by mouth 2 (two) times daily.   [DISCONTINUED] torsemide 40 MG TABS Take 40 mg by mouth 2 (two) times daily.   [DISCONTINUED] rivaroxaban (XARELTO) 20 MG TABS tablet Take 1 tablet (20 mg total) by mouth daily with supper. (Patient not taking: Reported on 09/17/2020)   No facility-administered encounter medications on file as of 09/17/2020.    Follow-up: Return in about 2 months (around 11/18/2020).   Shan Levans, MD

## 2020-09-17 NOTE — Assessment & Plan Note (Signed)
This has resolved but still bipap dependent

## 2020-09-17 NOTE — Assessment & Plan Note (Addendum)
From most recent hospitalization in May. She took 1 month of xarelto and did not refill due to cost. Has been following up with pulmonologist who is aware of anticoagulant status. She is scheduled for a repeat TTE and cardiology visit tomorrow. Stable, benign exam and patient not in distress. Will draw a D-dimer today through lab and f/u on echo study before resuming Xarelto which she can obtain at our pharmacy for free with PASS program

## 2020-09-17 NOTE — Assessment & Plan Note (Signed)
This has resolved.

## 2020-09-17 NOTE — Assessment & Plan Note (Signed)
Greatly affected by financial status, living situation and recent hospitalization. Set up with Bipap at night. She is due for a sleep study after she applies for orange card and financial assistance. OHS likely contributing to sleep apnea.

## 2020-09-17 NOTE — Assessment & Plan Note (Addendum)
K+ on 07/18 was 3.0 and was restarted on K supplement , 2 tab BID. Plan to redraw BMP today to eval electrolytes. Also on torsemide

## 2020-09-18 ENCOUNTER — Ambulatory Visit: Payer: Self-pay | Admitting: Cardiovascular Disease

## 2020-09-18 LAB — BASIC METABOLIC PANEL
BUN/Creatinine Ratio: 9 (ref 9–23)
BUN: 9 mg/dL (ref 6–24)
CO2: 27 mmol/L (ref 20–29)
Calcium: 8.2 mg/dL — ABNORMAL LOW (ref 8.7–10.2)
Chloride: 94 mmol/L — ABNORMAL LOW (ref 96–106)
Creatinine, Ser: 0.95 mg/dL (ref 0.57–1.00)
Glucose: 108 mg/dL — ABNORMAL HIGH (ref 65–99)
Potassium: 3.6 mmol/L (ref 3.5–5.2)
Sodium: 140 mmol/L (ref 134–144)
eGFR: 73 mL/min/{1.73_m2} (ref >59)

## 2020-09-18 LAB — LIPID PANEL
Chol/HDL Ratio: 3.1 ratio (ref 0.0–4.4)
Cholesterol, Total: 237 mg/dL — ABNORMAL HIGH (ref 100–199)
HDL: 76 mg/dL (ref >39)
LDL Chol Calc (NIH): 137 mg/dL — ABNORMAL HIGH (ref 0–99)
Triglycerides: 139 mg/dL (ref 0–149)
VLDL Cholesterol Cal: 24 mg/dL (ref 5–40)

## 2020-09-18 LAB — D-DIMER, QUANTITATIVE: D-DIMER: 0.52 mg/L FEU — ABNORMAL HIGH (ref 0.00–0.49)

## 2020-09-18 NOTE — Progress Notes (Deleted)
No chief complaint on file.  History of Present Illness: 50 yo female with history of sleep apnea, morbid obesity, anemia, pulmonary embolism, respiratory failure who is here today for cardiac follow up. She was recently admitted to [facility] in May 2022 with altered mental status and respiratory failure. She was found to have pneumonia and pulmonary embolism with renal failure. Echo with LVEF=55-60%. RV was dilated.  She was seen by Dr. Mayford Knife while in the hospital. Mild troponin elevation. She was discharged  on Xarelto but only took one month post discharge as she has no money, no housing and no insurance. She is living in a hotel. She has chronic lower extremity edema. Venous dopplers negative for DVT in may 2022. She has been on torsemide 10 mg po BID. She was seen yesterday by Dr. Delford Field in the pulmonary clinic. Echo today shows ***  She tells me today that she ***  Primary Care Physician: Storm Frisk, MD Primary Cardiologist: Armanda Magic  Past Medical History:  Diagnosis Date   Acute respiratory failure with hypoxia and hypercarbia (HCC)    AKI (acute kidney injury) (HCC)    Community acquired pneumonia    Demand ischemia (HCC)    Diarrhea    Hypercapnic respiratory failure (HCC) 06/25/2020   Hypoglycemia    Hypokalemia    Morbid obesity (HCC)    Normocytic anemia    OSA treated with BiPAP    Pulmonary embolism (HCC)    Transaminitis     No past surgical history on file.  Current Outpatient Medications  Medication Sig Dispense Refill   Multiple Vitamin (MULTIVITAMIN WITH MINERALS) TABS tablet Take 1 tablet by mouth daily.     pantoprazole (PROTONIX) 40 MG tablet Take 1 tablet (40 mg total) by mouth at bedtime. 30 tablet 2   potassium chloride SA (KLOR-CON) 20 MEQ tablet Take 2 tablets (40 mEq total) by mouth daily. (Patient taking differently: Take 40 mEq by mouth 2 (two) times daily.) 30 tablet 11   torsemide (DEMADEX) 20 MG tablet Take 40 mg by mouth 2 (two) times daily.      No current facility-administered medications for this visit.    No Known Allergies  Social History   Socioeconomic History   Marital status: Single    Spouse name: Not on file   Number of children: Not on file   Years of education: Not on file   Highest education level: Not on file  Occupational History   Not on file  Tobacco Use   Smoking status: Never   Smokeless tobacco: Never  Vaping Use   Vaping Use: Never used  Substance and Sexual Activity   Alcohol use: Not Currently   Drug use: Not Currently   Sexual activity: Not Currently    Birth control/protection: None  Other Topics Concern   Not on file  Social History Narrative   Not on file   Social Determinants of Health   Financial Resource Strain: Not on file  Food Insecurity: Not on file  Transportation Needs: Not on file  Physical Activity: Not on file  Stress: Not on file  Social Connections: Not on file  Intimate Partner Violence: Not on file    No family history on file.  Review of Systems:  As stated in the HPI and otherwise negative.   LMP  (LMP Unknown)   Physical Examination: General: Well developed, well nourished, NAD  HEENT: OP clear, mucus membranes moist  SKIN: warm, dry. No rashes. Neuro: No focal  deficits  Musculoskeletal: Muscle strength 5/5 all ext  Psychiatric: Mood and affect normal  Neck: No JVD, no carotid bruits, no thyromegaly, no lymphadenopathy.  Lungs:Clear bilaterally, no wheezes, rhonci, crackles Cardiovascular: Regular rate and rhythm. No murmurs, gallops or rubs. Abdomen:Soft. Bowel sounds present. Non-tender.  Extremities: No lower extremity edema. Pulses are 2 + in the bilateral DP/PT.  EKG:  EKG {ACTION; IS/IS ELF:81017510} ordered today. The ekg ordered today demonstrates ***  Echo 06/26/20:  1. Left ventricular ejection fraction, by estimation, is 55 to 60%. The  left ventricle has normal function. The left ventricle has no regional  wall motion  abnormalities. Left ventricular diastolic parameters were  normal. There is the interventricular  septum is flattened in systole and diastole, consistent with right  ventricular pressure and volume overload.   2. McConnell's Sign noted. Right ventricular systolic function is normal  in the base. The right ventricular size is moderately enlarged.   3. The mitral valve is grossly normal. No evidence of mitral valve  regurgitation.   4. The aortic valve is tricuspid. Aortic valve regurgitation is not  visualized. No aortic stenosis is present.   5. There is mild dilatation of the ascending aorta, measuring 41 mm.   Recent Labs: 07/04/2020: B Natriuretic Peptide 472.5 07/16/2020: ALT 24 07/22/2020: Hemoglobin 11.2; Magnesium 2.0; Platelets 285 09/08/2020: BUN 9; Creatinine, Ser 0.86; Potassium 3.0; Sodium 140   Lipid Panel    Component Value Date/Time   TRIG 73 06/29/2020 0438     Wt Readings from Last 3 Encounters:  09/17/20 (!) 391 lb (177.4 kg)  09/08/20 (!) 389 lb 3.2 oz (176.5 kg)  07/25/20 (!) 393 lb 1.3 oz (178.3 kg)     Other studies Reviewed: Additional studies/ records that were reviewed today include: ***. Review of the above records demonstrates: ***   Assessment and Plan:   1.   Current medicines are reviewed at length with the patient today.  The patient {ACTIONS; HAS/DOES NOT HAVE:19233} concerns regarding medicines.  The following changes have been made:  {PLAN; NO CHANGE:13088:s}  Labs/ tests ordered today include: *** No orders of the defined types were placed in this encounter.    Disposition:   FU with *** in {gen number 2-58:527782} {TIME; UNITS DAY/WEEK/MONTH:19136}   Signed, Verne Carrow, MD 09/18/2020 6:05 AM    [facility] [address] [place], [place], [place]  [address] Phone: [phone]; Fax: [phone]

## 2020-09-22 ENCOUNTER — Telehealth (HOSPITAL_COMMUNITY): Payer: Self-pay | Admitting: Pulmonary Disease

## 2020-09-22 NOTE — Telephone Encounter (Signed)
Patient did not wish to schedule at this time due to financial issues and pending Medicaid. She will call us once she gets insurance and will schedule. Order will be removed fromt he ECHO WQ an when she calls back to reschedule we will reinstate the order. Thank you.

## 2020-09-24 ENCOUNTER — Telehealth: Payer: Self-pay | Admitting: *Deleted

## 2020-09-24 NOTE — Telephone Encounter (Signed)
Does patient need to resume Xarelto? Does patient need to resume Torsemide?  Pls advise.

## 2020-09-25 NOTE — Telephone Encounter (Signed)
Let pt know D dimer is not high enough to resume xarelto  will await echocardiogram result    Stay off xarelto for now

## 2020-09-29 ENCOUNTER — Other Ambulatory Visit: Payer: Self-pay | Admitting: Critical Care Medicine

## 2020-09-29 MED ORDER — TORSEMIDE 20 MG PO TABS: 40.0000 mg | ORAL_TABLET | Freq: Two times a day (BID) | ORAL | 11 refills | Status: DC

## 2020-09-29 NOTE — Telephone Encounter (Signed)
Pt should stay on torsemide and STOP xarelto  I was waiting on heart ultrasound for further guidance and cardiology appt f/u.  I refilled the potassium and by oversight failed to refill the torsemide, it was sent just now to her [facility] pharmacy

## 2020-09-30 NOTE — Telephone Encounter (Signed)
LVM- Rx sent to pharmacy today.

## 2020-09-30 NOTE — Telephone Encounter (Signed)
Per patient, unable to afford the ultrasound. Waiting to seek financial assistance.

## 2020-10-01 ENCOUNTER — Telehealth: Payer: Self-pay

## 2020-10-01 NOTE — Telephone Encounter (Signed)
Following up on a request from the nurse manager on resources for the patient.  Verified patients name and date of birth.CM recanted information shared from our last in person discussion regarding resources on 09/17/20. Patient shared her funds are getting low, she is receiving food stamps and plans to looks for a job soon. Patient shared after she lost her job she was behind significantly on all her bills, resulting in a drop in her credit score. Patient shared there has not been a change on the status of her disability filed.  At this time, the patient is not homeless but her funds are getting low.   CM verified her address where she receives mail. CM shared with the patient without a stable income permanent housing can be difficult to acquire.

## 2020-11-17 ENCOUNTER — Encounter (HOSPITAL_BASED_OUTPATIENT_CLINIC_OR_DEPARTMENT_OTHER): Payer: Self-pay | Admitting: Pulmonary Disease

## 2020-12-03 ENCOUNTER — Telehealth: Payer: Self-pay

## 2020-12-03 NOTE — Telephone Encounter (Signed)
NOTES SCANNED TO REFERRAL 

## 2021-06-15 ENCOUNTER — Encounter (HOSPITAL_COMMUNITY): Payer: Self-pay

## 2021-06-15 ENCOUNTER — Emergency Department (HOSPITAL_COMMUNITY): Payer: Medicaid Other

## 2021-06-15 ENCOUNTER — Inpatient Hospital Stay (HOSPITAL_COMMUNITY)
Admission: EM | Admit: 2021-06-15 | Discharge: 2021-06-26 | DRG: 189 | Disposition: A | Discharge status: Skilled Nursing Facility | Payer: Medicaid Other | Attending: Internal Medicine | Admitting: Internal Medicine

## 2021-06-15 ENCOUNTER — Other Ambulatory Visit: Payer: Self-pay

## 2021-06-15 DIAGNOSIS — F329 Major depressive disorder, single episode, unspecified: Secondary | ICD-10-CM

## 2021-06-15 DIAGNOSIS — Z560 Unemployment, unspecified: Secondary | ICD-10-CM

## 2021-06-15 DIAGNOSIS — Z86711 Personal history of pulmonary embolism: Secondary | ICD-10-CM

## 2021-06-15 DIAGNOSIS — I5032 Chronic diastolic (congestive) heart failure: Secondary | ICD-10-CM | POA: Diagnosis present

## 2021-06-15 DIAGNOSIS — J9621 Acute and chronic respiratory failure with hypoxia: Principal | ICD-10-CM | POA: Diagnosis present

## 2021-06-15 DIAGNOSIS — Z6841 Body Mass Index (BMI) 40.0 and over, adult: Secondary | ICD-10-CM

## 2021-06-15 DIAGNOSIS — Z20822 Contact with and (suspected) exposure to covid-19: Secondary | ICD-10-CM | POA: Diagnosis present

## 2021-06-15 DIAGNOSIS — J9622 Acute and chronic respiratory failure with hypercapnia: Secondary | ICD-10-CM | POA: Diagnosis present

## 2021-06-15 DIAGNOSIS — J9601 Acute respiratory failure with hypoxia: Secondary | ICD-10-CM

## 2021-06-15 DIAGNOSIS — D509 Iron deficiency anemia, unspecified: Secondary | ICD-10-CM | POA: Diagnosis present

## 2021-06-15 DIAGNOSIS — G9341 Metabolic encephalopathy: Secondary | ICD-10-CM | POA: Diagnosis present

## 2021-06-15 DIAGNOSIS — R4689 Other symptoms and signs involving appearance and behavior: Secondary | ICD-10-CM

## 2021-06-15 DIAGNOSIS — Z8701 Personal history of pneumonia (recurrent): Secondary | ICD-10-CM

## 2021-06-15 DIAGNOSIS — Z79899 Other long term (current) drug therapy: Secondary | ICD-10-CM

## 2021-06-15 DIAGNOSIS — Z046 Encounter for general psychiatric examination, requested by authority: Secondary | ICD-10-CM

## 2021-06-15 DIAGNOSIS — E876 Hypokalemia: Secondary | ICD-10-CM

## 2021-06-15 DIAGNOSIS — R7303 Prediabetes: Secondary | ICD-10-CM | POA: Diagnosis present

## 2021-06-15 DIAGNOSIS — I110 Hypertensive heart disease with heart failure: Secondary | ICD-10-CM | POA: Diagnosis present

## 2021-06-15 DIAGNOSIS — G4733 Obstructive sleep apnea (adult) (pediatric): Secondary | ICD-10-CM

## 2021-06-15 DIAGNOSIS — D649 Anemia, unspecified: Secondary | ICD-10-CM

## 2021-06-15 DIAGNOSIS — T501X6A Underdosing of loop [high-ceiling] diuretics, initial encounter: Secondary | ICD-10-CM | POA: Diagnosis present

## 2021-06-15 DIAGNOSIS — R5381 Other malaise: Secondary | ICD-10-CM

## 2021-06-15 DIAGNOSIS — J9620 Acute and chronic respiratory failure, unspecified whether with hypoxia or hypercapnia: Principal | ICD-10-CM

## 2021-06-15 DIAGNOSIS — E878 Other disorders of electrolyte and fluid balance, not elsewhere classified: Secondary | ICD-10-CM

## 2021-06-15 DIAGNOSIS — Z7901 Long term (current) use of anticoagulants: Secondary | ICD-10-CM

## 2021-06-15 DIAGNOSIS — Z91148 Patient's other noncompliance with medication regimen for other reason: Secondary | ICD-10-CM

## 2021-06-15 DIAGNOSIS — I5082 Biventricular heart failure: Secondary | ICD-10-CM | POA: Diagnosis present

## 2021-06-15 DIAGNOSIS — E662 Morbid (severe) obesity with alveolar hypoventilation: Secondary | ICD-10-CM | POA: Diagnosis present

## 2021-06-15 NOTE — ED Triage Notes (Signed)
Pt BIB EMS. Pt was on the floor for 2 days. Family is saying that she cannot care for herself so they IVC her.  ?

## 2021-06-16 ENCOUNTER — Emergency Department (HOSPITAL_COMMUNITY): Payer: Medicaid Other

## 2021-06-16 ENCOUNTER — Inpatient Hospital Stay (HOSPITAL_COMMUNITY): Payer: Medicaid Other

## 2021-06-16 ENCOUNTER — Other Ambulatory Visit (HOSPITAL_COMMUNITY): Payer: Medicaid Other

## 2021-06-16 DIAGNOSIS — F329 Major depressive disorder, single episode, unspecified: Secondary | ICD-10-CM

## 2021-06-16 DIAGNOSIS — Z046 Encounter for general psychiatric examination, requested by authority: Secondary | ICD-10-CM | POA: Diagnosis not present

## 2021-06-16 DIAGNOSIS — E662 Morbid (severe) obesity with alveolar hypoventilation: Secondary | ICD-10-CM | POA: Diagnosis present

## 2021-06-16 DIAGNOSIS — I110 Hypertensive heart disease with heart failure: Secondary | ICD-10-CM | POA: Diagnosis present

## 2021-06-16 DIAGNOSIS — G4733 Obstructive sleep apnea (adult) (pediatric): Secondary | ICD-10-CM | POA: Diagnosis not present

## 2021-06-16 DIAGNOSIS — J9601 Acute respiratory failure with hypoxia: Secondary | ICD-10-CM | POA: Diagnosis present

## 2021-06-16 DIAGNOSIS — T501X6A Underdosing of loop [high-ceiling] diuretics, initial encounter: Secondary | ICD-10-CM | POA: Diagnosis present

## 2021-06-16 DIAGNOSIS — Z6841 Body Mass Index (BMI) 40.0 and over, adult: Secondary | ICD-10-CM | POA: Diagnosis not present

## 2021-06-16 DIAGNOSIS — E876 Hypokalemia: Secondary | ICD-10-CM

## 2021-06-16 DIAGNOSIS — Z86711 Personal history of pulmonary embolism: Secondary | ICD-10-CM | POA: Diagnosis not present

## 2021-06-16 DIAGNOSIS — J9621 Acute and chronic respiratory failure with hypoxia: Secondary | ICD-10-CM | POA: Diagnosis present

## 2021-06-16 DIAGNOSIS — Z8701 Personal history of pneumonia (recurrent): Secondary | ICD-10-CM | POA: Diagnosis not present

## 2021-06-16 DIAGNOSIS — Z560 Unemployment, unspecified: Secondary | ICD-10-CM | POA: Diagnosis not present

## 2021-06-16 DIAGNOSIS — R7303 Prediabetes: Secondary | ICD-10-CM | POA: Diagnosis present

## 2021-06-16 DIAGNOSIS — Z91148 Patient's other noncompliance with medication regimen for other reason: Secondary | ICD-10-CM | POA: Diagnosis not present

## 2021-06-16 DIAGNOSIS — J9622 Acute and chronic respiratory failure with hypercapnia: Secondary | ICD-10-CM | POA: Diagnosis present

## 2021-06-16 DIAGNOSIS — I5082 Biventricular heart failure: Secondary | ICD-10-CM | POA: Diagnosis present

## 2021-06-16 DIAGNOSIS — G9341 Metabolic encephalopathy: Secondary | ICD-10-CM | POA: Diagnosis present

## 2021-06-16 DIAGNOSIS — Z7901 Long term (current) use of anticoagulants: Secondary | ICD-10-CM | POA: Diagnosis not present

## 2021-06-16 DIAGNOSIS — I5032 Chronic diastolic (congestive) heart failure: Secondary | ICD-10-CM | POA: Diagnosis present

## 2021-06-16 DIAGNOSIS — Z79899 Other long term (current) drug therapy: Secondary | ICD-10-CM | POA: Diagnosis not present

## 2021-06-16 DIAGNOSIS — R0609 Other forms of dyspnea: Secondary | ICD-10-CM | POA: Diagnosis not present

## 2021-06-16 DIAGNOSIS — D509 Iron deficiency anemia, unspecified: Secondary | ICD-10-CM | POA: Diagnosis present

## 2021-06-16 DIAGNOSIS — Z20822 Contact with and (suspected) exposure to covid-19: Secondary | ICD-10-CM | POA: Diagnosis present

## 2021-06-16 LAB — URINALYSIS, ROUTINE W REFLEX MICROSCOPIC
Bilirubin Urine: NEGATIVE
Glucose, UA: NEGATIVE mg/dL
Hgb urine dipstick: NEGATIVE
Ketones, ur: NEGATIVE mg/dL
Nitrite: NEGATIVE
Protein, ur: NEGATIVE mg/dL
Specific Gravity, Urine: 1.015 (ref 1.005–1.030)
pH: 6 (ref 5.0–8.0)

## 2021-06-16 LAB — CBC WITH DIFFERENTIAL/PLATELET
Abs Immature Granulocytes: 0.04 10*3/uL (ref 0.00–0.07)
Basophils Absolute: 0 10*3/uL (ref 0.0–0.1)
Basophils Relative: 0 %
Eosinophils Absolute: 0.1 10*3/uL (ref 0.0–0.5)
Eosinophils Relative: 2 %
HCT: 44.7 % (ref 36.0–46.0)
Hemoglobin: 12.1 g/dL (ref 12.0–15.0)
Immature Granulocytes: 1 %
Lymphocytes Relative: 11 %
Lymphs Abs: 0.8 10*3/uL (ref 0.7–4.0)
MCH: 21.8 pg — ABNORMAL LOW (ref 26.0–34.0)
MCHC: 27.1 g/dL — ABNORMAL LOW (ref 30.0–36.0)
MCV: 80.7 fL (ref 80.0–100.0)
Monocytes Absolute: 0.6 10*3/uL (ref 0.1–1.0)
Monocytes Relative: 9 %
Neutro Abs: 5.3 10*3/uL (ref 1.7–7.7)
Neutrophils Relative %: 77 %
Platelets: 256 10*3/uL (ref 150–400)
RBC: 5.54 MIL/uL — ABNORMAL HIGH (ref 3.87–5.11)
RDW: 21.2 % — ABNORMAL HIGH (ref 11.5–15.5)
WBC: 6.8 10*3/uL (ref 4.0–10.5)
nRBC: 0 % (ref 0.0–0.2)

## 2021-06-16 LAB — CBC
HCT: 44 % (ref 36.0–46.0)
Hemoglobin: 12 g/dL (ref 12.0–15.0)
MCH: 22.2 pg — ABNORMAL LOW (ref 26.0–34.0)
MCHC: 27.3 g/dL — ABNORMAL LOW (ref 30.0–36.0)
MCV: 81.5 fL (ref 80.0–100.0)
Platelets: 271 10*3/uL (ref 150–400)
RBC: 5.4 MIL/uL — ABNORMAL HIGH (ref 3.87–5.11)
RDW: 21.2 % — ABNORMAL HIGH (ref 11.5–15.5)
WBC: 6.4 10*3/uL (ref 4.0–10.5)
nRBC: 0 % (ref 0.0–0.2)

## 2021-06-16 LAB — RESP PANEL BY RT-PCR (FLU A&B, COVID) ARPGX2
Influenza A by PCR: NEGATIVE
Influenza B by PCR: NEGATIVE
SARS Coronavirus 2 by RT PCR: NEGATIVE

## 2021-06-16 LAB — GLUCOSE, CAPILLARY: Glucose-Capillary: 94 mg/dL (ref 70–99)

## 2021-06-16 LAB — VITAMIN B12: Vitamin B-12: 624 pg/mL (ref 180–914)

## 2021-06-16 LAB — TROPONIN I (HIGH SENSITIVITY)
Troponin I (High Sensitivity): 15 ng/L (ref <18)
Troponin I (High Sensitivity): 12 ng/L (ref <18)

## 2021-06-16 LAB — COMPREHENSIVE METABOLIC PANEL
ALT: 37 U/L (ref 0–44)
AST: 35 U/L (ref 15–41)
Albumin: 3.3 g/dL — ABNORMAL LOW (ref 3.5–5.0)
Alkaline Phosphatase: 56 U/L (ref 38–126)
Anion gap: 7 (ref 5–15)
BUN: 7 mg/dL (ref 6–20)
CO2: 35 mmol/L — ABNORMAL HIGH (ref 22–32)
Calcium: 8 mg/dL — ABNORMAL LOW (ref 8.9–10.3)
Chloride: 97 mmol/L — ABNORMAL LOW (ref 98–111)
Creatinine, Ser: 0.67 mg/dL (ref 0.44–1.00)
GFR, Estimated: >60 mL/min (ref >60)
Glucose, Bld: 97 mg/dL (ref 70–99)
Potassium: 3.1 mmol/L — ABNORMAL LOW (ref 3.5–5.1)
Sodium: 139 mmol/L (ref 135–145)
Total Bilirubin: 2.1 mg/dL — ABNORMAL HIGH (ref 0.3–1.2)
Total Protein: 6.6 g/dL (ref 6.5–8.1)

## 2021-06-16 LAB — BLOOD GAS, ARTERIAL
Acid-Base Excess: 14.5 mmol/L — ABNORMAL HIGH (ref 0.0–2.0)
Bicarbonate: 42.6 mmol/L — ABNORMAL HIGH (ref 20.0–28.0)
Delivery systems: POSITIVE
Drawn by: 56037
Expiratory PAP: 8 cmH2O
FIO2: 30 %
Inspiratory PAP: 16 cmH2O
O2 Saturation: 97.7 %
PIP: 14 cmH2O
Patient temperature: 36.6
pCO2 arterial: 71 mmHg (ref 32–48)
pH, Arterial: 7.39 (ref 7.35–7.45)
pO2, Arterial: 77 mmHg — ABNORMAL LOW (ref 83–108)

## 2021-06-16 LAB — ACETAMINOPHEN LEVEL: Acetaminophen (Tylenol), Serum: <10 ug/mL — ABNORMAL LOW (ref 10–30)

## 2021-06-16 LAB — RETICULOCYTES
Immature Retic Fract: 22.6 % — ABNORMAL HIGH (ref 2.3–15.9)
RBC.: 5.51 MIL/uL — ABNORMAL HIGH (ref 3.87–5.11)
Retic Count, Absolute: 94.8 10*3/uL (ref 19.0–186.0)
Retic Ct Pct: 1.7 % (ref 0.4–3.1)

## 2021-06-16 LAB — FERRITIN: Ferritin: 12 ng/mL (ref 11–307)

## 2021-06-16 LAB — RAPID URINE DRUG SCREEN, HOSP PERFORMED
Amphetamines: NOT DETECTED
Barbiturates: NOT DETECTED
Benzodiazepines: NOT DETECTED
Cocaine: NOT DETECTED
Opiates: NOT DETECTED
Tetrahydrocannabinol: NOT DETECTED

## 2021-06-16 LAB — SALICYLATE LEVEL: Salicylate Lvl: <7 mg/dL — ABNORMAL LOW (ref 7.0–30.0)

## 2021-06-16 LAB — CREATININE, SERUM
Creatinine, Ser: 0.75 mg/dL (ref 0.44–1.00)
GFR, Estimated: >60 mL/min (ref >60)

## 2021-06-16 LAB — CK: Total CK: 98 U/L (ref 38–234)

## 2021-06-16 LAB — BRAIN NATRIURETIC PEPTIDE: B Natriuretic Peptide: 199.2 pg/mL — ABNORMAL HIGH (ref 0.0–100.0)

## 2021-06-16 LAB — PROCALCITONIN: Procalcitonin: <0.1 ng/mL

## 2021-06-16 LAB — ETHANOL: Alcohol, Ethyl (B): <10 mg/dL (ref <10)

## 2021-06-16 LAB — IRON AND TIBC
Iron: 45 ug/dL (ref 28–170)
Saturation Ratios: 11 % (ref 10.4–31.8)
TIBC: 422 ug/dL (ref 250–450)
UIBC: 377 ug/dL

## 2021-06-16 LAB — LACTIC ACID, PLASMA: Lactic Acid, Venous: 1.2 mmol/L (ref 0.5–1.9)

## 2021-06-16 LAB — TSH: TSH: 1.865 u[IU]/mL (ref 0.350–4.500)

## 2021-06-16 LAB — FOLATE: Folate: 5.5 ng/mL — ABNORMAL LOW (ref >5.9)

## 2021-06-16 MED ORDER — SODIUM CHLORIDE 0.9 % IV SOLN: 250.0000 mL | INTRAVENOUS | Status: DC | PRN

## 2021-06-16 MED ORDER — IOHEXOL 350 MG/ML SOLN
100.0000 mL | Freq: Once | INTRAVENOUS | Status: AC | PRN
  Administered 2021-06-16: 100 mL via INTRAVENOUS

## 2021-06-16 MED ORDER — PANTOPRAZOLE SODIUM 40 MG PO TBEC
40.0000 mg | DELAYED_RELEASE_TABLET | Freq: Every day | ORAL | Status: DC
  Administered 2021-06-17 – 2021-06-25 (×9): 40 mg via ORAL
  Filled 2021-06-16 (×10): qty 1

## 2021-06-16 MED ORDER — POTASSIUM CHLORIDE CRYS ER 20 MEQ PO TBCR
20.0000 meq | EXTENDED_RELEASE_TABLET | Freq: Two times a day (BID) | ORAL | Status: DC
Start: 2021-06-16 — End: 2021-06-18
  Administered 2021-06-16 – 2021-06-17 (×2): 20 meq via ORAL
  Filled 2021-06-16 (×4): qty 1

## 2021-06-16 MED ORDER — SODIUM CHLORIDE 0.9 % IV SOLN
INTRAVENOUS | Status: AC
Start: 2021-06-16 — End: 2021-06-16

## 2021-06-16 MED ORDER — POLYETHYLENE GLYCOL 3350 17 G PO PACK
17.0000 g | PACK | Freq: Two times a day (BID) | ORAL | Status: AC
  Administered 2021-06-16: 17 g via ORAL
  Filled 2021-06-16 (×2): qty 1

## 2021-06-16 MED ORDER — ONDANSETRON HCL 4 MG/2ML IJ SOLN: 4.0000 mg | Freq: Four times a day (QID) | INTRAMUSCULAR | Status: DC | PRN

## 2021-06-16 MED ORDER — POTASSIUM CHLORIDE 10 MEQ/100ML IV SOLN
10.0000 meq | INTRAVENOUS | Status: AC
  Administered 2021-06-16 – 2021-06-17 (×2): 10 meq via INTRAVENOUS
  Filled 2021-06-16: qty 100

## 2021-06-16 MED ORDER — SODIUM CHLORIDE 0.9 % IV SOLN
500.0000 mg | Freq: Once | INTRAVENOUS | Status: AC
  Administered 2021-06-16: 500 mg via INTRAVENOUS
  Filled 2021-06-16: qty 5

## 2021-06-16 MED ORDER — ENOXAPARIN SODIUM 80 MG/0.8ML IJ SOSY
80.0000 mg | PREFILLED_SYRINGE | INTRAMUSCULAR | Status: DC
  Administered 2021-06-16 – 2021-06-18 (×3): 80 mg via SUBCUTANEOUS
  Filled 2021-06-16 (×3): qty 0.8

## 2021-06-16 MED ORDER — ONDANSETRON HCL 4 MG PO TABS: 4.0000 mg | ORAL_TABLET | Freq: Four times a day (QID) | ORAL | Status: DC | PRN

## 2021-06-16 MED ORDER — SODIUM CHLORIDE (PF) 0.9 % IJ SOLN
INTRAMUSCULAR | Status: AC
  Administered 2021-06-16: 3 mL via INTRAVENOUS
  Filled 2021-06-16: qty 100

## 2021-06-16 MED ORDER — SODIUM CHLORIDE 0.9% FLUSH: 3.0000 mL | INTRAVENOUS | Status: DC | PRN

## 2021-06-16 MED ORDER — SODIUM CHLORIDE 0.9 % IV SOLN
2.0000 g | Freq: Once | INTRAVENOUS | Status: AC
  Administered 2021-06-16: 2 g via INTRAVENOUS
  Filled 2021-06-16: qty 20

## 2021-06-16 MED ORDER — ACETAMINOPHEN 650 MG RE SUPP
650.0000 mg | Freq: Four times a day (QID) | RECTAL | Status: DC | PRN
Start: 2021-06-16 — End: 2021-06-27

## 2021-06-16 MED ORDER — SODIUM CHLORIDE 0.9% FLUSH
3.0000 mL | Freq: Two times a day (BID) | INTRAVENOUS | Status: DC
  Administered 2021-06-17 – 2021-06-26 (×19): 3 mL via INTRAVENOUS

## 2021-06-16 MED ORDER — ACETAMINOPHEN 325 MG PO TABS: 650.0000 mg | ORAL_TABLET | Freq: Four times a day (QID) | ORAL | Status: DC | PRN

## 2021-06-16 MED ORDER — SODIUM CHLORIDE 0.9 % IV SOLN: 1.0000 g | Freq: Once | INTRAVENOUS | Status: DC

## 2021-06-16 NOTE — H&P (Addendum)
? ? ?History and Physical ? ?Felicia Warren [id number] DOB: 1970/12/11 DOA: 06/15/2021 ? ?PCP: Elsie Stain, MD ?Patient coming from: Home ? ?I have personally briefly reviewed patient's old medical records in [place] ? ? ?Chief Complaint: home ? ?HPI: Felicia Warren is a 51 y.o. female past medical history of hypertension, brought into the emergency room by her family as she had been in multiple room on the floor for several days not being able to get off on the floor refusing medical attention.  Most of the history is obtained from the chart as the patient denies all this.  Family states that she is depressed due to not having a job.  She has been IVC by the ED. ? ?In the ED: ?She was found to be hypoxic, which improved with 2 L of oxygen, mildly hypokalemic with blood cell count of 6.8 hemoglobin of 12 BNP of 200 troponin 15, bicarb of 35 Tylenol and salicylate levels are unremarkable SARS-CoV-2 and influenza PCR negative UA shows a specific gravity of 1015 no hemoglobin alcohol level less than 10 UDS is negative.  CT angio of the chest showed body wall edema no PE, signs of chronic pulmonary hypertension no infiltrates or concern or pneumonia.  CT of the head showed no acute intracranial processes. ? ? ? ?Review of Systems: All systems reviewed and apart from history of presenting illness, are negative. ? ?Past Medical History:  ?Diagnosis Date  ? Acute respiratory failure with hypoxia and hypercarbia (HCC)   ? AKI (acute kidney injury) ([place])   ? Community acquired pneumonia   ? Demand ischemia ([place])   ? Diarrhea   ? Hypercapnic respiratory failure ([place]) 06/25/2020  ? Hypoglycemia   ? Hypokalemia   ? Morbid obesity ([place])   ? Normocytic anemia   ? OSA treated with BiPAP   ? Pulmonary embolism ([place])   ? Transaminitis   ? ?History reviewed. No pertinent surgical history. ?Social History:  reports that she has never smoked. She has never used smokeless tobacco. She reports that she does not currently use  alcohol. She reports that she does not currently use drugs. ? ? ?No Known Allergies ? ?History reviewed. No pertinent family history.  ?The patient cannot provide me her family history ? ?Prior to Admission medications   ?Medication Sig Start Date End Date Taking? Authorizing Provider  ?Multiple Vitamin (MULTIVITAMIN WITH MINERALS) TABS tablet Take 1 tablet by mouth daily. 07/26/20   Ezekiel Slocumb, DO  ?pantoprazole (PROTONIX) 40 MG tablet Take 1 tablet (40 mg total) by mouth at bedtime. 07/25/20   Ezekiel Slocumb, DO  ?potassium chloride SA (KLOR-CON) 20 MEQ tablet Take 2 tablets (40 mEq total) by mouth daily. ?Patient taking differently: Take 40 mEq by mouth 2 (two) times daily. 09/09/20   Hunsucker, Bonna Gains, MD  ?torsemide (DEMADEX) 20 MG tablet Take 2 tablets (40 mg total) by mouth 2 (two) times daily. 09/29/20   Elsie Stain, MD  ? ?Physical Exam: ?Vitals:  ? 06/16/21 0430 06/16/21 0530 06/16/21 0600 06/16/21 0655  ?BP: (!) 133/98 (!) 103/57 136/82 137/70  ?Pulse: 93 75 100 87  ?Resp: (!) 23 20 (!) 25 17  ?Temp:      ?TempSrc:      ?SpO2: 95% 97% 95% 96%  ?Weight:      ?Height:      ? ? ?General exam: Super morbid obese, there is a musty smell in the patient's room ?Head, eyes and ENT: Nontraumatic and  normocephalic.  ?Neck: Supple. No JVD, carotid bruit or thyromegaly. ?Lymphatics: No lymphadenopathy. ?Respiratory system: Clear to auscultation. No increased work of breathing. ?Cardiovascular system: S1 and S2 heard, RRR.  Body wall edema ?Gastrointestinal system: Abdomen is nondistended, soft and nontender.  ?Central nervous system: Alert and oriented. No focal neurological deficits. ?Extremities: Symmetric 5 x 5 power. Peripheral pulses symmetrically felt.  ?Skin: Cannot appreciate any rashes or infection in her skin ?Musculoskeletal system: Negative exam. ?Psychiatry: Pleasant and cooperative. ? ? ?Labs on Admission:  ?Basic Metabolic Panel: ?Recent Labs  ?Lab 06/15/21 ?2328  ?NA 139  ?K 3.1*  ?CL 97*   ?CO2 35*  ?GLUCOSE 97  ?BUN 7  ?CREATININE 0.67  ?CALCIUM 8.0*  ? ?Liver Function Tests: ?Recent Labs  ?Lab 06/15/21 ?2328  ?AST 35  ?ALT 37  ?ALKPHOS 56  ?BILITOT 2.1*  ?PROT 6.6  ?ALBUMIN 3.3*  ? ?No results for input(s): LIPASE, AMYLASE in the last 168 hours. ?No results for input(s): AMMONIA in the last 168 hours. ?CBC: ?Recent Labs  ?Lab 06/15/21 ?2328  ?WBC 6.8  ?NEUTROABS 5.3  ?HGB 12.1  ?HCT 44.7  ?MCV 80.7  ?PLT 256  ? ?Cardiac Enzymes: ?No results for input(s): CKTOTAL, CKMB, CKMBINDEX, TROPONINI in the last 168 hours. ? ?BNP (last 3 results) ?No results for input(s): PROBNP in the last 8760 hours. ?CBG: ?No results for input(s): GLUCAP in the last 168 hours. ? ?Radiological Exams on Admission: ?CT Head Wo Contrast ? ?Result Date: 06/16/2021 ?CLINICAL DATA:  Mental status change, unknown cause. EXAM: CT HEAD WITHOUT CONTRAST TECHNIQUE: Contiguous axial images were obtained from the base of the skull through the vertex without intravenous contrast. RADIATION DOSE REDUCTION: This exam was performed according to the departmental dose-optimization program which includes automated exposure control, adjustment of the mA and/or kV according to patient size and/or use of iterative reconstruction technique. COMPARISON:  06/25/2020. FINDINGS: Brain: No acute intracranial hemorrhage, midline shift or mass effect. No extra-axial fluid collection. Mild periventricular white matter hypodensities are present bilaterally. No hydrocephalus. Vascular: No hyperdense vessel or unexpected calcification. Skull: Normal. Negative for fracture or focal lesion. Sinuses/Orbits: Mild mucosal thickening in the maxillary sinuses. No acute orbital abnormality. Other: None. IMPRESSION: 1. No acute intracranial process. 2. Chronic microvascular ischemic changes. Electronically Signed   By: Brett Fairy M.D.   On: 06/16/2021 01:37  ? ?CT Angio Chest PE W/Cm &/Or Wo Cm ? ?Result Date: 06/16/2021 ?CLINICAL DATA:  High probability for  pulmonary embolism. EXAM: CT ANGIOGRAPHY CHEST WITH CONTRAST TECHNIQUE: Multidetector CT imaging of the chest was performed using the standard protocol during bolus administration of intravenous contrast. Multiplanar CT image reconstructions and MIPs were obtained to evaluate the vascular anatomy. RADIATION DOSE REDUCTION: This exam was performed according to the departmental dose-optimization program which includes automated exposure control, adjustment of the mA and/or kV according to patient size and/or use of iterative reconstruction technique. CONTRAST:  133mL OMNIPAQUE IOHEXOL 350 MG/ML SOLN COMPARISON:  06/30/2020 FINDINGS: Cardiovascular: Satisfactory opacification of the pulmonary arteries to the segmental level but very limited at the segmental and beyond levels due to body habitus and motion artifact. No evidence of pulmonary embolism. Enlarged heart size. No pericardial effusion. Large main pulmonary artery at 3.7 cm diameter, suggesting pulmonary hypertension Mediastinum/Nodes: Negative for adenopathy or mass Lungs/Pleura: Central airways are clear. Mild hazy and streaky density in the lungs favors atelectasis from expiratory phase. Upper Abdomen: No acute finding Musculoskeletal: Body wall edema. Diffuse degenerative endplate spurring. Review of the MIP images  confirms the above findings. IMPRESSION: 1. Very limited CTA primarily due to body habitus and motion. No gross pulmonary embolism. There are signs of chronic pulmonary hypertension. 2. Low volume chest with atelectasis. 3. Cardiomegaly and body wall edema. Electronically Signed   By: Jorje Guild M.D.   On: 06/16/2021 05:44  ? ?DG Chest Portable 1 View ? ?Result Date: 06/15/2021 ?CLINICAL DATA:  Hypoxia, history of pulmonary embolism EXAM: PORTABLE CHEST 1 VIEW COMPARISON:  07/05/2020 FINDINGS: Low lung volumes. Increased interstitial markings, favoring mild interstitial edema, although multifocal pneumonia is possible. No definite pleural  effusions. No pneumothorax. Cardiomegaly. IMPRESSION: Multifocal interstitial/patchy opacities, favoring mild interstitial edema, although multifocal pneumonia is possible. No definite pleural effusions. Electr

## 2021-06-16 NOTE — Progress Notes (Signed)
Called to Pts room for rapid response, respiratory distress,02 dropping. Pt more lethargic and confused with increased WOB upon arrival. Pt transferred to step down, placed on BiPAP, tolerating well at this time. ABG obtained. ?

## 2021-06-16 NOTE — TOC Initial Note (Signed)
Transition of Care (TOC) - Initial/Assessment Note  ? ?Patient Details  ?Name: Felicia Warren ?MRN: [id number] ?Date of Birth: 1970/02/26 ? ?Transition of Care (TOC) CM/SW Contact:    ?Sherie Don, LCSW ?Phone Number: ?06/16/2021, 3:39 PM ? ?Clinical Narrative: CSW notified by psychiatrist, Dr. Lovette Cliche, that patient's DOB on IVC paperwork is incorrect. New IVC paperwork completed and faxed to magistrate. CSW called magistrate's office to confirm receipt and was informed by chief Vevelyn Royals that an new IVC does not need to be completed for an incorrect DOB. Ms. Felicia Warren requested that [place] write on the IVC "per chief magistrate Antonelli's authority" the DOB could be manually stricken out and corrected. CSW corrected IVC paperwork. ? ?Expected Discharge Plan: Home/Self Care ?Barriers to Discharge: Continued Medical Work up ? ?Patient Goals and CMS Choice ?Choice offered to / list presented to : NA ? ?Expected Discharge Plan and Services ?Expected Discharge Plan: Home/Self Care ?In-house Referral: Clinical Social Work ?Post Acute Care Choice: NA ?Living arrangements for the past 2 months: Hotel/Motel              ?DME Arranged: N/A ?DME Agency: NA ? ?Prior Living Arrangements/Services ?Living arrangements for the past 2 months: Hotel/Motel ?Lives with:: Self ?Patient language and need for interpreter reviewed:: Yes ?Need for Family Participation in Patient Care: Yes (Comment) ?Care giver support system in place?: Yes (comment) ?Criminal Activity/Legal Involvement Pertinent to Current Situation/Hospitalization: No - Comment as needed ? ?Admission diagnosis:  Acute respiratory failure with hypoxia ([place]) [J96.01] ?Acute and chr resp failure, unsp w hypoxia or hypercapnia (HCC) [J96.20] ?Patient Active Problem List  ? Diagnosis Date Noted  ? Acute on chronic respiratory failure with hypoxia ([place]) 06/16/2021  ? Major depression, single episode 06/16/2021  ? Acute respiratory failure with hypoxia ([place]) 06/16/2021   ? BMI 60.0-69.9, adult ([place]) 09/17/2020  ? Prediabetes 09/17/2020  ? Hypokalemia   ? Pulmonary emboli ([place]) 07/05/2020  ? ?PCP:  Elsie Stain, MD ?Pharmacy:   ?[id number] - [place], [address] ?[place] ?[place] [address] ?Phone: [phone] Fax: [phone] ? ?Readmission Risk Interventions ?   ? View : No data to display.  ?  ?  ?  ? ?

## 2021-06-16 NOTE — ED Provider Notes (Signed)
[facility]Provider Note ? ? ?CSN: [id number] ?Arrival date & time: 06/15/21  2308 ? ?  ? ?History ? ?Chief Complaint  ?Patient presents with  ? IVC  ? ? ?Felicia Warren is a 51 y.o. female. ? ?The history is provided by the patient and medical records.  ?Felicia Warren is a 51 y.o. female who presents to the Emergency Department complaining of IVC.  She presents to the emergency department by IVC for evaluation of not getting off the floor.  Per IVC she has been in a hotel and has not been getting off the floor for several days and she is refusing to get medical attention.  Family states that she is depressed due to not having her job.  Patient states that she goes to the floor to stretch and states that she has no medical problems and does not want to be here.  She has no acute complaints.  Denies any fevers, chest pain, abdominal pain, nausea, vomiting. ?  ? ?Home Medications ?Prior to Admission medications   ?Medication Sig Start Date End Date Taking? Authorizing Provider  ?Multiple Vitamin (MULTIVITAMIN WITH MINERALS) TABS tablet Take 1 tablet by mouth daily. 07/26/20   Pennie BanterGriffith, Kelly A, DO  ?pantoprazole (PROTONIX) 40 MG tablet Take 1 tablet (40 mg total) by mouth at bedtime. 07/25/20   Pennie BanterGriffith, Kelly A, DO  ?potassium chloride SA (KLOR-CON) 20 MEQ tablet Take 2 tablets (40 mEq total) by mouth daily. ?Patient taking differently: Take 40 mEq by mouth 2 (two) times daily. 09/09/20   Hunsucker, Lesia SagoMatthew R, MD  ?torsemide (DEMADEX) 20 MG tablet Take 2 tablets (40 mg total) by mouth 2 (two) times daily. 09/29/20   Storm FriskWright, Patrick E, MD  ?   ? ?Allergies    ?Patient has no known allergies.   ? ?Review of Systems   ?Review of Systems  ?All other systems reviewed and are negative. ? ?Physical Exam ?Updated Vital Signs ?BP 137/70   Pulse 87   Temp 98.5 ?F (36.9 ?C) (Oral)   Resp 17   Ht 5\' 4"  (1.626 m)   Wt (!) 181.4 kg   SpO2 96%   BMI 68.66 kg/m?  ?Physical Exam ?Vitals and nursing  note reviewed.  ?Constitutional:   ?   Appearance: She is well-developed.  ?   Comments: Disheveled  ?HENT:  ?   Head: Normocephalic and atraumatic.  ?Cardiovascular:  ?   Rate and Rhythm: Regular rhythm. Tachycardia present.  ?   Heart sounds: No murmur heard. ?Pulmonary:  ?   Effort: Pulmonary effort is normal. No respiratory distress.  ?   Comments: Distant breath sounds ?Abdominal:  ?   Palpations: Abdomen is soft.  ?   Tenderness: There is no abdominal tenderness. There is no guarding or rebound.  ?   Comments: There is induration and erythema to the right hemiabdomen.  There is induration to the breast bilaterally without any focal tenderness  ?Musculoskeletal:     ?   General: No swelling or tenderness.  ?Skin: ?   General: Skin is warm and dry.  ?Neurological:  ?   Mental Status: She is alert and oriented to person, place, and time.  ?   Comments: 4-5/5 strength in all 4 extremities  ?Psychiatric:  ?   Comments: Denies SI/HI  ? ? ?ED Results / Procedures / Treatments   ?Labs ?(all labs ordered are listed, but only abnormal results are displayed) ?Labs Reviewed  ?CBC WITH DIFFERENTIAL/PLATELET - Abnormal; Notable  for the following components:  ?    Result Value  ? RBC 5.54 (*)   ? MCH 21.8 (*)   ? MCHC 27.1 (*)   ? RDW 21.2 (*)   ? All other components within normal limits  ?COMPREHENSIVE METABOLIC PANEL - Abnormal; Notable for the following components:  ? Potassium 3.1 (*)   ? Chloride 97 (*)   ? CO2 35 (*)   ? Calcium 8.0 (*)   ? Albumin 3.3 (*)   ? Total Bilirubin 2.1 (*)   ? All other components within normal limits  ?SALICYLATE LEVEL - Abnormal; Notable for the following components:  ? Salicylate Lvl <7.0 (*)   ? All other components within normal limits  ?ACETAMINOPHEN LEVEL - Abnormal; Notable for the following components:  ? Acetaminophen (Tylenol), Serum <10 (*)   ? All other components within normal limits  ?BRAIN NATRIURETIC PEPTIDE - Abnormal; Notable for the following components:  ? B Natriuretic  Peptide 199.2 (*)   ? All other components within normal limits  ?URINALYSIS, ROUTINE W REFLEX MICROSCOPIC - Abnormal; Notable for the following components:  ? Color, Urine AMBER (*)   ? Leukocytes,Ua TRACE (*)   ? Bacteria, UA RARE (*)   ? All other components within normal limits  ?RESP PANEL BY RT-PCR (FLU A&B, COVID) [id number]  ?CULTURE, BLOOD (ROUTINE X 2)  ?CULTURE, BLOOD (ROUTINE X 2)  ?RAPID URINE DRUG SCREEN, HOSP PERFORMED  ?ETHANOL  ?LACTIC ACID, PLASMA  ?TROPONIN I (HIGH SENSITIVITY)  ?TROPONIN I (HIGH SENSITIVITY)  ? ? ?EKG ?EKG Interpretation ? ?Date/Time:  Tuesday June 16 2021 02:59:37 EDT ?Ventricular Rate:  91 ?PR Interval:  143 ?QRS Duration: 67 ?QT Interval:  435 ?QTC Calculation: 536 ?R Axis:   101 ?Text Interpretation: Sinus rhythm Anterior infarct, age indeterminate Prolonged QT interval Confirmed by Tilden Fossa [phone]) on 06/16/2021 4:18:01 AM ? ?Radiology ?CT Head Wo Contrast ? ?Result Date: 06/16/2021 ?CLINICAL DATA:  Mental status change, unknown cause. EXAM: CT HEAD WITHOUT CONTRAST TECHNIQUE: Contiguous axial images were obtained from the base of the skull through the vertex without intravenous contrast. RADIATION DOSE REDUCTION: This exam was performed according to the departmental dose-optimization program which includes automated exposure control, adjustment of the mA and/or kV according to patient size and/or use of iterative reconstruction technique. COMPARISON:  06/25/2020. FINDINGS: Brain: No acute intracranial hemorrhage, midline shift or mass effect. No extra-axial fluid collection. Mild periventricular white matter hypodensities are present bilaterally. No hydrocephalus. Vascular: No hyperdense vessel or unexpected calcification. Skull: Normal. Negative for fracture or focal lesion. Sinuses/Orbits: Mild mucosal thickening in the maxillary sinuses. No acute orbital abnormality. Other: None. IMPRESSION: 1. No acute intracranial process. 2. Chronic microvascular ischemic changes.  Electronically Signed   By: Thornell Sartorius M.D.   On: 06/16/2021 01:37  ? ?CT Angio Chest PE W/Cm &/Or Wo Cm ? ?Result Date: 06/16/2021 ?CLINICAL DATA:  High probability for pulmonary embolism. EXAM: CT ANGIOGRAPHY CHEST WITH CONTRAST TECHNIQUE: Multidetector CT imaging of the chest was performed using the standard protocol during bolus administration of intravenous contrast. Multiplanar CT image reconstructions and MIPs were obtained to evaluate the vascular anatomy. RADIATION DOSE REDUCTION: This exam was performed according to the departmental dose-optimization program which includes automated exposure control, adjustment of the mA and/or kV according to patient size and/or use of iterative reconstruction technique. CONTRAST:  OMNIPAQUE IOHEXOL 350 MG/ML SOLN COMPARISON:  06/30/2020 FINDINGS: Cardiovascular: Satisfactory opacification of the pulmonary arteries to the segmental level but very limited at  the segmental and beyond levels due to body habitus and motion artifact. No evidence of pulmonary embolism. Enlarged heart size. No pericardial effusion. Large main pulmonary artery at 3.7 cm diameter, suggesting pulmonary hypertension Mediastinum/Nodes: Negative for adenopathy or mass Lungs/Pleura: Central airways are clear. Mild hazy and streaky density in the lungs favors atelectasis from expiratory phase. Upper Abdomen: No acute finding Musculoskeletal: Body wall edema. Diffuse degenerative endplate spurring. Review of the MIP images confirms the above findings. IMPRESSION: 1. Very limited CTA primarily due to body habitus and motion. No gross pulmonary embolism. There are signs of chronic pulmonary hypertension. 2. Low volume chest with atelectasis. 3. Cardiomegaly and body wall edema. Electronically Signed   By: Tiburcio Pea M.D.   On: 06/16/2021 05:44  ? ?DG Chest Portable 1 View ? ?Result Date: 06/15/2021 ?CLINICAL DATA:  Hypoxia, history of pulmonary embolism EXAM: PORTABLE CHEST 1 VIEW COMPARISON:   07/05/2020 FINDINGS: Low lung volumes. Increased interstitial markings, favoring mild interstitial edema, although multifocal pneumonia is possible. No definite pleural effusions. No pneumothorax. [facility]

## 2021-06-16 NOTE — ED Notes (Signed)
Patient refused 2nd Blood culture. ?

## 2021-06-16 NOTE — Consult Note (Signed)
[facility] New Face-to-Face Psychiatric Evaluation ? ? ?Service Date: June 16, 2021 ?LOS:  LOS: 0 days  ? ? ?Assessment  ?Felicia Warren is a 51 y.o. female admitted medically for 06/15/2021 11:15 PM for acute respiratory failure and self-neglect. She carries the psychiatric diagnoses of ?depression and has a past medical history of  obesity, OSA, pulmonary embolism. Notably family filled out IVC to have her evaluated at the hospital as she refused to go to the hospital of her own accord. Psychiatry was consulted for depression by Lovell Sheehan.  ? ?At this time, the patient's presentation is most consistent with mild hypoactive delirium, most likely due to multiple etiologies including but not limited to infection, medications, pain, anemia, altered sleep/wake cycle and in particular to this patient respiratory failure, limited mobility, and poor CPAP compliance.   ? ? ?The patient would strongly benefit from medical treatment of hypoxic respiratory failure and limited mobility, as well as further investigation for etiologies of delirium. During this time period, minimization of delirogenic insults will be of utmost importance; this includes promoting the normal circadian cycle, minimizing lines/tubes, avoiding deliriogenic medications such as benzodiazepines and anticholinergic medications, and frequently reorienting the patient. Symptomatic treatment for agitation can be provided by antipsychotic medications, though it is important to remember that these do not treat the underlying etiology of delirium. Notably, there can be a time lag effect between treatment of a medical problem and resolution of delirium. This time lag effect may be of longer duration in the elderly, and those with underlying cognitive impairment or brain injury. ? ?She is currently demonstrating a clear preference to leave the hospital AMA. She does not demonstrate understanding of the reason she was hospitalized, acknowledge  the risks of leaving the hospital AMA, or demonstrate an adequate rationality for this decision. Of note, the capacity evaluation assesses only for the specified decision documented above and is not a determination of the patient's overall competency, which can only be adjudicated. ? ?In this case, the patient lacks capacity to decide to leave the hospital AMA. See patient interview below for details. ? ? ?Diagnoses:  ?Active Hospital problems: ?Principal Problem: ?  Acute respiratory failure with hypoxia ([place]) ?Active Problems: ?  Hypokalemia ?  BMI 60.0-69.9, adult ([place]) ?  Acute on chronic respiratory failure with hypoxia (HCC) ?  Major depression, single episode ?  ? ? ?Plan  ?## Safety and Observation Level:  ?- Needs sitter per hospital policy while under IVC ? ? ?## Medications:  ?-- Deferred; inappropriate to start antidepressant medications in setting of encephalopathy. Would consider if pt amenable after this resolves ? ?## Medical Decision Making Capacity:  ?Lacks capacity to leave AMA ? ?## Further Work-up:  ?-- TSH - went ahead and ordered as pt is hard stick and nurse in room attempting to draw blood, although typically labwork ordered by primary team.  ? ?-- consider night time CPAP.  ? ?-- most recent EKG on 4/25 had QtC of 536 ?-- Pertinent labwork reviewed earlier this admission includes: low folate, B12 wnl, low MCV, CK WNL  ? ?## Disposition:  ?-- per primary team ? ?##Legal Status ?-- under IVC, although the IVC on file is invalid (wrong birthday). Will need to be renewed by hospital team. In this case IVC functioning as medical hold as pt lacks capacity to leave AMA.  ? ?Thank you for this consult request. Recommendations have been communicated to the primary team.  We will continue to follow at this  time.  ? ?Joycelyn Schmid A [place] [employer] ? ? ?New history   ?Relevant Aspects of Hospital Course:  ?Admitted on 06/15/2021 for hypoxic respiratory failure. ? ?Patient Report:  ?Patient states that she was  not informed that psychiatry was consulted. She is frustrated that she is in the hospital when she was on the floor "stretching" and that it "felt good". She is oriented to self, month/year, and general situation however poorly attentive (DOWB with numerous errors/back tracking). She has significant difficulty holding a conversation - thought process tangential and answers often unrelated to questions asked. She is difficult to understand and often mutters under her breath (but seems to expect interviewer to understand what she is saying). She has poor insight into her overall condition and devotes much of interview towards expressing frustration that she was brought into the hospital against her will. Becomes tearful when talking about being brought into the hospital. Does appear to be future oriented - has job interviews lined up she is hoping to get to. Patient does endorse feeling that her thoughts are more confused + difficult to keep track of. Attributes this to poor CPAP compliance over past 2-3 weeks.  ? ?She denies any depression although endorses many individual symptoms of such. She denies any history of diagnosed depression or other mental illness.  ? ?ROS:  ?Frustration, low energy, low mood. Does not endorse SOB although clearly struggling to catch her breath. Confusion.  ? ?Collateral information:  ?Spoke to sister Felicia Warren. States pt is very private, and around this time last year had gotten a call from the hospital that she was found unresponsive in a hotel room. Stayed in for a few weeks and had gotten things together, was doing well. Recently she had gotten to where she had a job but was fired 2-3 weeks ago. Had been looking for a place to stay. Sister thinks she went into a depression after losing her job. When she went to go check on her saw pt lying on the floor, saw that she was unable to get up. Seemed like she had been down for a while. Sister thinks pt  was too prideful to call anyone  for help. Called brother - pt unable to get up on her own. They did call an ambulance and pt refused to get into the ambulance but was unable to get off the floor. Sister ultimately filled out IVC paperwork to force a medical evaluation. Had been trying to convince her to seek medical care. Was surprised by psychiatry consult - biggest concern is the medical part.  ? ?Sister has no worries that pt has been suicidal or is a danger to herself outside of avoiding medical care/neglect. Sister thinks that she was really hurt after losing her job, had meant a lot to her. ? ?Sister says that she is not normally difficult to understand. Described pt's current mental status - sister says "that is how she was the first time (ie May 2022 hospitalization)" but returned to baseline. Sister asked after UTI results as she was afraid UTI affecting mental status.  ? ?Psychiatric History:  ?Information collected from pt, sister ? ?Family psych history: None per sister, no family hx substance abuse.  ? ? ?Social History:  ?Lifelong learner  ? ?Tobacco use: Denies ?Alcohol use: Denies ?Drug use: Denies ? ?Family History:  ?The patient's family history is not on file. ? ?Medical History: ?Past Medical History:  ?Diagnosis Date  ? Acute respiratory failure with hypoxia and hypercarbia (HCC)   ?  AKI (acute kidney injury) ([place])   ? Community acquired pneumonia   ? Demand ischemia ([place])   ? Diarrhea   ? Hypercapnic respiratory failure ([place]) 06/25/2020  ? Hypoglycemia   ? Hypokalemia   ? Morbid obesity ([place])   ? Normocytic anemia   ? OSA treated with BiPAP   ? Pulmonary embolism ([place])   ? Transaminitis   ? ? ?Surgical History: ?History reviewed. No pertinent surgical history. ? ?Medications:  ? ?Current Facility-Administered Medications:  ?  0.9 %  sodium chloride infusion, 250 mL, Intravenous, PRN, Charlynne Cousins, MD ?  0.9 %  sodium chloride infusion, , Intravenous, Continuous, Charlynne Cousins, MD, Last Rate: 75 mL/hr at 06/16/21  1222, New Bag at 06/16/21 1222 ?  acetaminophen (TYLENOL) tablet 650 mg, 650 mg, Oral, Q6H PRN **OR** acetaminophen (TYLENOL) suppository 650 mg, 650 mg, Rectal, Q6H PRN, Charlynne Cousins, MD ?  [facility]

## 2021-06-16 NOTE — ED Notes (Signed)
Pt Belongings have been removed form room and placed behind nurses station (13-22 rm). Pt has 3 belonging bags. ?

## 2021-06-16 NOTE — Progress Notes (Signed)
Moved patient on BiPAP to room 1225 without event.  ?

## 2021-06-16 NOTE — Progress Notes (Addendum)
? ? ?  OVERNIGHT PROGRESS REPORT ?. ?Notified by rapid response RN for patient "obtunded". ? ?Brief History ?51 year old female past medical history of hypertension who was brought to the emergency room after her family noted that she had been in her room for multiple days and possibly on the floor not being able to get up and refusing medical care. ? ?Family states that she has been depressed due to not having a job, she has IVC by the ER physician in place. ? ?In the ER she was found to be hypoxic. ?CT angio of the chest showed no PE, signs of chronic pulmonary hypertension, no infiltrates or pneumonia. ? ?CT of the head shows no acute processes. ? ?Rapid response ?On arrival to bedside supplemental O2 had been supplied by RT/RN and patient was starting to arouse. ?ABG is ordered CXR is ordered. ? ?Past medical history notes OSA treated with BiPAP ?Current diagnosis from prior note indicates prior hypercapnic respiratory failure. ? ?Current H&P notes likely due to hypoxia due to obesity hypoventilation syndrome. ? ? ?Currently : ?ABG pending, CXR pending, patient is transferred to SDU, placed on BiPAP currently. ?Patient is awake and oriented x4 at this time. ?BP/HR maintained throughout. ? ?Chinita Greenland MSNA MSN ACNPC-AG ?Acute Care Nurse Practitioner ?[facility] ?[place] ? ? ? ?

## 2021-06-16 NOTE — Progress Notes (Signed)
Pt had a change in mental statis and vitals around 1850.  Dr Aileen Fass was notified.  ?Rapid response and respiratory were called and they came to the bedside.   Pt was assessed and then transferred to ICU.  ?

## 2021-06-16 NOTE — Significant Event (Signed)
Rapid Response Event Note  ? ?Reason for Call : Notified by [facility] Nursing Staff about patient having a change in respiratory status. Upon arrival, patient was obtunded, barely responding to sternal rub. Patient appearing to have OSA upon assessment. Respiratory Therapy was notified for STAT ABG and BIPAP. O2 Sats 85-88% on 2L. Increased from 2L to 4L, O2 Sats increased to 92-94%.  ? ?Initial Focused Assessment:  ?Neuro: Unable to follow commands, obtunded, responding to sternal rub barely. Patient able to respond by voice after sternal rub but becomes obtunded soon after. Extremities appear to have purposeful movement when responding to painful stimuli.  ?Cardiac: ST, HR 115-120s, Heart sounds distant, see BP in vital signs flow sheet ?Pulmonary: Breath sounds diminished in a lung fields bilaterally, O2 Sats originally 85-88% on 2L, increased to 4L, O2 Sats now 92-94% ? ?Interventions:  ?Notified Respiratory Therapy for ABG ?STAT ABG ordered placed, CXR, and BIPAP per Rapid Response Protocol order set ?[facility] NP Chinita Greenland notified to come to beside ? ?Plan of Care:  ?Patient transferred to SDU for STAT BIPAP ? ? ?Event Summary:  ? ?MD Notified: NP Chinita Greenland ?Call Time: Rapid Response called at 1900  ?Arrival Time: 1904  ?End Time: 2000  ? ?Henrene Hawking, RN ?

## 2021-06-17 ENCOUNTER — Inpatient Hospital Stay (HOSPITAL_COMMUNITY): Payer: Medicaid Other

## 2021-06-17 DIAGNOSIS — R4689 Other symptoms and signs involving appearance and behavior: Secondary | ICD-10-CM

## 2021-06-17 DIAGNOSIS — R0609 Other forms of dyspnea: Secondary | ICD-10-CM | POA: Diagnosis not present

## 2021-06-17 DIAGNOSIS — Z046 Encounter for general psychiatric examination, requested by authority: Secondary | ICD-10-CM

## 2021-06-17 DIAGNOSIS — E878 Other disorders of electrolyte and fluid balance, not elsewhere classified: Secondary | ICD-10-CM

## 2021-06-17 DIAGNOSIS — G4733 Obstructive sleep apnea (adult) (pediatric): Secondary | ICD-10-CM

## 2021-06-17 DIAGNOSIS — Z91412 Personal history of adult neglect: Secondary | ICD-10-CM

## 2021-06-17 LAB — BASIC METABOLIC PANEL
Anion gap: 8 (ref 5–15)
BUN: <5 mg/dL — ABNORMAL LOW (ref 6–20)
CO2: 35 mmol/L — ABNORMAL HIGH (ref 22–32)
Calcium: 7.7 mg/dL — ABNORMAL LOW (ref 8.9–10.3)
Chloride: 99 mmol/L (ref 98–111)
Creatinine, Ser: 0.71 mg/dL (ref 0.44–1.00)
GFR, Estimated: >60 mL/min (ref >60)
Glucose, Bld: 83 mg/dL (ref 70–99)
Potassium: 3.6 mmol/L (ref 3.5–5.1)
Sodium: 142 mmol/L (ref 135–145)

## 2021-06-17 LAB — CBC
HCT: 42.4 % (ref 36.0–46.0)
Hemoglobin: 10.9 g/dL — ABNORMAL LOW (ref 12.0–15.0)
MCH: 21.5 pg — ABNORMAL LOW (ref 26.0–34.0)
MCHC: 25.7 g/dL — ABNORMAL LOW (ref 30.0–36.0)
MCV: 83.8 fL (ref 80.0–100.0)
Platelets: 246 10*3/uL (ref 150–400)
RBC: 5.06 MIL/uL (ref 3.87–5.11)
RDW: 21.1 % — ABNORMAL HIGH (ref 11.5–15.5)
WBC: 6 10*3/uL (ref 4.0–10.5)
nRBC: 0 % (ref 0.0–0.2)

## 2021-06-17 MED ORDER — CHLORHEXIDINE GLUCONATE CLOTH 2 % EX PADS
6.0000 | MEDICATED_PAD | Freq: Every day | CUTANEOUS | Status: DC
  Administered 2021-06-17 – 2021-06-22 (×4): 6 via TOPICAL

## 2021-06-17 NOTE — Progress Notes (Signed)
RN made Dr. Alanda Slim aware that unable to give oral potassium due to NPO order and pending speech eval. MD acknowledged.  ?

## 2021-06-17 NOTE — Progress Notes (Signed)
?PROGRESS NOTE ? ?Felicia Warren [id number] DOB: 08-13-1970  ? ?PCP: Elsie Stain, MD ? ?Patient is from: Extended stay ? ?DOA: 06/15/2021 LOS: 1 ? ?Chief complaints ?Chief Complaint  ?Patient presents with  ? IVC  ?  ? ?Brief Narrative / Interim history: ?51 year old F with PMH of OSA on CPAP, HTN, morbid obesity and PE not on AC brought to ED by family members under IVC due to self-neglect and inability to get off the floor and refusing medical attention, and admitted for acute hypoxic respiratory failure.  Basic labs including CBC and CMP without significant finding.  CT head without acute finding.  Troponin negative x2.  BNP 200.  CTA chest negative for PE but body wall edema and signs of chronic pulmonary hypertension.  Psychiatry consulted.   ? ?Subjective: ?Seen and examined earlier this morning.  Patient was found obtunded last night.  Rapid response called.  She was started on BiPAP and transferred to stepdown unit.  She is awake this morning but not quite alert.  She is oriented x4.  She states she is in the hospital because a CPAP was not working.  She denies chest pain or shortness of breath.  She says she lost her job recently.  She denies depressed mood or history of depression.  She denies history of heart disease, diabetes or kidney disease. ? ?Objective: ?Vitals:  ? 06/17/21 1000 06/17/21 1100 06/17/21 1200 06/17/21 1300  ?BP: (!) 119/48 128/60 (!) 114/48 (!) 132/48  ?Pulse: 88 88 87 89  ?Resp: (!) 22 (!) 21 14 20   ?Temp:   99 ?F (37.2 ?C)   ?TempSrc:   Axillary   ?SpO2: 100% 95% 95% 99%  ?Weight:      ?Height:      ? ? ?Examination: ? ?GENERAL: No apparent distress.  Nontoxic. ?HEENT: MMM.  Poor dentition.  Vision and hearing grossly intact.  ?NECK: Supple.  No apparent JVD.  ?RESP:  No IWOB.  Fair aeration bilaterally. ?CVS:  RRR. Heart sounds normal.  ?ABD/GI/GU: BS+. Abd soft, NTND.  ?MSK/EXT:  Moves extremities. No apparent deformity. No edema.  ?SKIN: no apparent skin lesion or  wound ?NEURO: Awake but not alert.  Oriented x4.  No apparent focal neuro deficit. ?PSYCH: Calm. Normal affect.  ? ?Procedures:  ?None ? ?Microbiology summarized: ?COVID-19 and influenza PCR nonreactive. ?Blood cultures NGTD. ? ?Assessment and Plan: ?* Acute respiratory failure with hypoxia ([place]) ?Likely due to OSA/possible OHS.  She also carries history of right heart failure.  ABG suggests chronic respiratory acidosis.  CTA chest negative for PE or infectious process but concerning body wall edema.  BNP 200, could be falsely low.  Serial troponin negative.  Fluid status difficult to assess due to body habitus.  She seems to be on torsemide at home. ?-Continue BiPAP as needed and at night. ?-Minimum oxygen to keep saturation above 88% ?-N.p.o. pending SLP eval ?-Follow echocardiogram ? ?Involuntary commitment ?Patient was involuntary committed by family member/sister due to self-neglect and refusal to seek medical attention.  Evaluated by psychiatry and deemed to lack capacity. ?-Appreciate help by psychiatry ?-IVC remains in effect ?-Continue safety sitter ? ?Acute metabolic encephalopathy ?Due to hypercapnia?  CT head and basic labs, UDS, TSH and B12 without significant finding.  Does not seem to be on meds with risk for encephalopathy.  Low suspicion for infectious process.  She is awake and oriented x4 but not quite alert.  No focal neurodeficit. ?-Basic encephalopathy labs ?-Minimum oxygen to keep saturation above  88%. ?-Reorientation and delirium precaution ? ?Self neglect ?Seen involuntary commitment ? ?Hypokalemia ?Improved.  K3.6. ?Continue p.o. KCl 20 mill equivalent twice daily. ? ?OSA on CPAP ?BiPAP as needed ? ?BMI 60.0-69.9, adult ([place]) ?Body mass index is 68.66 kg/m?. ?-Encourage lifestyle change to lose weight. ? ?History of pulmonary embolism ?Not on anticoagulation at home. ?-Continue subcu Lovenox for VTE prophylaxis ? ? ?DVT prophylaxis:  ?On subcu Lovenox ? ?Code Status: Full code ?Family  Communication: Updated patient's sister over the phone ?Level of care: Progressive ?Status is: Inpatient ?Remains inpatient appropriate because: Acute metabolic encephalopathy, respiratory failure and clearance by psychiatry. ? ? ?Final disposition: TBD ?Consultants:  ?Psychiatry ? ?Sch Meds:  ?Scheduled Meds: ? Chlorhexidine Gluconate Cloth  6 each Topical Daily  ? enoxaparin (LOVENOX) injection  80 mg Subcutaneous Q24H  ? pantoprazole  40 mg Oral QHS  ? polyethylene glycol  17 g Oral BID  ? potassium chloride  20 mEq Oral BID  ? sodium chloride flush  3 mL Intravenous Q12H  ? ?Continuous Infusions: ? sodium chloride    ? ?PRN Meds:.sodium chloride, acetaminophen **OR** acetaminophen, ondansetron **OR** ondansetron (ZOFRAN) IV, sodium chloride flush ? ?Antimicrobials: ?Anti-infectives (From admission, onward)  ? ? Start     Dose/Rate Route Frequency Ordered Stop  ? 06/16/21 0115  cefTRIAXone (ROCEPHIN) 1 g in sodium chloride 0.9 % 100 mL IVPB  Status:  Discontinued       ? 1 g ?200 mL/hr over 30 Minutes Intravenous  Once 06/16/21 0101 06/16/21 0102  ? 06/16/21 0115  azithromycin (ZITHROMAX) 500 mg in sodium chloride 0.9 % 250 mL IVPB       ? 500 mg ?250 mL/hr over 60 Minutes Intravenous  Once 06/16/21 0101 06/16/21 0501  ? 06/16/21 0115  cefTRIAXone (ROCEPHIN) 2 g in sodium chloride 0.9 % 100 mL IVPB       ? 2 g ?200 mL/hr over 30 Minutes Intravenous  Once 06/16/21 0102 06/16/21 0415  ? ?  ? ? ? ?I have personally reviewed the following labs and images: ?CBC: ?Recent Labs  ?Lab 06/15/21 ?2328 06/16/21 ?1046 06/17/21 ?0241  ?WBC 6.8 6.4 6.0  ?NEUTROABS 5.3  --   --   ?HGB 12.1 12.0 10.9*  ?HCT 44.7 44.0 42.4  ?MCV 80.7 81.5 83.8  ?PLT 256 271 246  ? ?BMP &GFR ?Recent Labs  ?Lab 06/15/21 ?2328 06/16/21 ?1046 06/17/21 ?0241  ?NA 139  --  142  ?K 3.1*  --  3.6  ?CL 97*  --  99  ?CO2 35*  --  35*  ?GLUCOSE 97  --  83  ?BUN 7  --  <5*  ?CREATININE 0.67 0.75 0.71  ?CALCIUM 8.0*  --  7.7*  ? ?Estimated Creatinine  Clearance: 136.9 mL/min (by C-G formula based on SCr of 0.71 mg/dL). ?Liver & Pancreas: ?Recent Labs  ?Lab 06/15/21 ?2328  ?AST 35  ?ALT 37  ?ALKPHOS 56  ?BILITOT 2.1*  ?PROT 6.6  ?ALBUMIN 3.3*  ? ?No results for input(s): LIPASE, AMYLASE in the last 168 hours. ?No results for input(s): AMMONIA in the last 168 hours. ?Diabetic: ?No results for input(s): HGBA1C in the last 72 hours. ?Recent Labs  ?Lab 06/16/21 ?1917  ?GLUCAP 94  ? ?Cardiac Enzymes: ?Recent Labs  ?Lab 06/16/21 ?0230  ?CKTOTAL 98  ? ?No results for input(s): PROBNP in the last 8760 hours. ?Coagulation Profile: ?No results for input(s): INR, PROTIME in the last 168 hours. ?Thyroid Function Tests: ?Recent Labs  ?  06/16/21 ?  1510  ?TSH 1.865  ? ?Lipid Profile: ?No results for input(s): CHOL, HDL, LDLCALC, TRIG, CHOLHDL, LDLDIRECT in the last 72 hours. ?Anemia Panel: ?Recent Labs  ?  06/16/21 ?1203  ?VITAMINB12 624  ?FOLATE 5.5*  ?FERRITIN 12  ?TIBC 422  ?IRON 45  ?RETICCTPCT 1.7  ? ?Urine analysis: ?   ?Component Value Date/Time  ? COLORURINE AMBER (A) 06/16/2021 0310  ? APPEARANCEUR CLEAR 06/16/2021 0310  ? LABSPEC 1.015 06/16/2021 0310  ? PHURINE 6.0 06/16/2021 0310  ? GLUCOSEU NEGATIVE 06/16/2021 0310  ? [place] NEGATIVE 06/16/2021 0310  ? [place] NEGATIVE 06/16/2021 0310  ? [place] NEGATIVE 06/16/2021 0310  ? [place] NEGATIVE 06/16/2021 0310  ? NITRITE NEGATIVE 06/16/2021 0310  ? LEUKOCYTESUR TRACE (A) 06/16/2021 0310  ? ?Sepsis Labs: ?Invalid input(s): PROCALCITONIN, LACTICIDVEN ? ?Microbiology: ?Recent Results (from the past 240 hour(s))  ?Culture, blood (routine x 2)     Status: None (Preliminary result)  ? Collection Time: 06/16/21  3:00 AM  ? Specimen: BLOOD  ?Result Value Ref Range Status  ? Specimen Description   Final  ?  BLOOD RIGHT ANTECUBITAL ?Performed at [facility], [place] [address] [place], [place] [address] ?  ? Special Requests   Final  ?  BOTTLES DRAWN AEROBIC AND ANAEROBIC Blood Culture results may not be  optimal due to an excessive volume of blood received in culture bottles ?Performed at [facility], [place] [address] [place], [place] [address] ?  ? Culture   Final  ?  NO GROWTH 1 DAY ?Performed at [facility]

## 2021-06-17 NOTE — Assessment & Plan Note (Signed)
Seen involuntary commitment ?

## 2021-06-17 NOTE — Assessment & Plan Note (Addendum)
Patient was involuntary committed by family member/sister due to self-neglect and refusal to seek medical attention.  Evaluated by psychiatry and deemed to lack capacity but no psychiatric condition.  At some point, she threatened to leave AMA but not anymore.  Not a danger to self or someone else ?-IVC rescinded.  Safety sitter discontinued. ?

## 2021-06-17 NOTE — Evaluation (Signed)
Physical Therapy Evaluation ?Patient Details ?Name: Felicia Warren ?MRN: [id number] ?DOB: Jun 01, 1970 ?Today's Date: 06/17/2021 ? ?History of Present Illness ? Felicia Warren is a 51 y.o. female admitted 4/24 brought into the emergency room by her family as she had been in multiple room on the floor for several days not being able to get off on the floor refusing medical attention.  Most of the history is obtained from the chart as the patient denies all this.  Family states that she is depressed due to not having a job.  She has been IVC by the ED.  PMH: hypertension  ?Clinical Impression ? Pt admitted with above diagnosis. Pt was able to progress ambulation into hallway but needs +2 for safety. If pt doesn't have 24 hour care initially may need a brief SNF stay as she does need to improve her balance and endurance prior to d/c home.  Unsure where pt's equipment is and she will need RW at home initially.  Pt with poor safety awareness overall. Pt currently with functional limitations due to the deficits listed below (see PT Problem List). Pt will benefit from skilled PT to increase their independence and safety with mobility to allow discharge to the venue listed below.      ?   ? ?Recommendations for follow up therapy are one component of a multi-disciplinary discharge planning process, led by the attending physician.  Recommendations may be updated based on patient status, additional functional criteria and insurance authorization. ? ?Follow Up Recommendations Home health PT If pt has 24 hour care, otherwise SNF for rehab ? ?  ?Assistance Recommended at Discharge Intermittent Supervision/Assistance  ?Patient can return home with the following ? A little help with walking and/or transfers;A little help with bathing/dressing/bathroom;Assist for transportation ? ?  ?Equipment Recommendations None recommended by PT  ?Recommendations for Other Services ?    ?  ?Functional Status Assessment Patient has had a recent decline in  their functional status and demonstrates the ability to make significant improvements in function in a reasonable and predictable amount of time.  ? ?  ?Precautions / Restrictions Precautions ?Precautions: Fall ?Restrictions ?Weight Bearing Restrictions: No  ? ?  ? ?Mobility ? Bed Mobility ?Overal bed mobility: Independent ?  ?  ?  ?  ?  ?  ?  ?  ? ?Transfers ?Overall transfer level: Needs assistance ?Equipment used: Rolling walker (2 wheels) ?Transfers: Sit to/from Stand ?Sit to Stand: Min assist, +2 physical assistance ?  ?  ?  ?  ?  ?General transfer comment: Pt needed slight assist with power up.  Steadying assist once pt on her feet prior to moving. Pt marched in place [place] before beginning ambulation. ?  ? ?Ambulation/Gait ?Ambulation/Gait assistance: Min assist, +2 safety/equipment ?Gait Distance (Feet): 120 Feet ?Assistive device: Rolling walker (2 wheels) ?Gait Pattern/deviations: Step-through pattern, Decreased stride length, Trunk flexed, Wide base of support, Drifts right/left, Leaning posteriorly ?  ?Gait velocity interpretation: <1.31 ft/sec, indicative of household ambulator ?  ?General Gait Details: Pt able to progress ambulation with bariatric RW with overall steady gait but moves slowly and very deliberate with each movement.  Needed help steering RW as she ran into things on left at times. Pt walked to nursing desk and rested on it for a bit and then turned to walk back to room.  Pt decided she didnt want to sit up in chair therefore got back to sit on bed. Pt had one LOB - not sure if pt stretching but  lost balance backwards toward end of walk and pt appears very unaware of her lack of safety awareness.  Pt HR 87-130 bpm with activity.  100% at 4L at rest.  Desat as low as 85% but with pursed lip breathing able to incr to >90% on 4LO2.  BP 146/72. ? ?Stairs ?  ?  ?  ?  ?  ? ?Wheelchair Mobility ?  ? ?Modified Rankin (Stroke Patients Only) ?  ? ?  ? ?Balance Overall balance assessment: Needs  assistance ?Sitting-balance support: No upper extremity supported, Feet supported ?Sitting balance-Leahy Scale: Fair ?  ?  ?Standing balance support: Bilateral upper extremity supported, During functional activity ?Standing balance-Leahy Scale: Poor ?Standing balance comment: Pt was able to stand with RW with bil UE support and external support. ?  ?  ?  ?  ?  ?  ?  ?  ?  ?  ?  ?   ? ? ? ?Pertinent Vitals/Pain Pain Assessment ?Pain Assessment: No/denies pain  ? ? ?Home Living Family/patient expects to be discharged to:: Private residence ?Living Arrangements: Alone ?Available Help at Discharge: Family;Available PRN/intermittently ?Type of Home: Other(Comment) (hotel) ?Home Access: Level entry ?  ?  ?  ?Home Layout: One level ?Home Equipment: Rollator (4 wheels);Cane - single point ?Additional Comments: Pt unreliable historian.  States she came to [facility] in April 2022. Chart states she was staying at hotel but does have sisster and brother in area.  ?  ?Prior Function Prior Level of Function : Independent/Modified Independent (lost job 3 weeks ago) ?  ?  ?  ?  ?  ?  ?  ?  ?  ? ? ?Hand Dominance  ? Dominant Hand: Right ? ?  ?Extremity/Trunk Assessment  ? Upper Extremity Assessment ?Upper Extremity Assessment: Defer to OT evaluation ?  ? ?Lower Extremity Assessment ?Lower Extremity Assessment: Generalized weakness ?  ? ?Cervical / Trunk Assessment ?Cervical / Trunk Assessment: Normal  ?Communication  ? Communication: No difficulties  ?Cognition Arousal/Alertness: Awake/alert ?Behavior During Therapy: [facility] for tasks assessed/performed ?Overall Cognitive Status: Within Functional Limits for tasks assessed ?  ?  ?  ?  ?  ?  ?  ?  ?  ?  ?  ?  ?  ?  ?  ?  ?General Comments: At times pt appeared confused but was able to answer orientation questions correctly. ?  ?  ? ?  ?General Comments   ? ?  ?Exercises General Exercises - Lower Extremity ?Ankle Circles/Pumps: AROM, Both, 10 reps, Seated ?Long Arc Quad: AROM, Both, 10 reps,  Seated ?Hip Flexion/Marching: AROM, Both, 5 reps, Seated  ? ?Assessment/Plan  ?  ?PT Assessment Patient needs continued PT services  ?PT Problem List Decreased activity tolerance;Decreased balance;Decreased mobility;Decreased knowledge of use of DME;Decreased safety awareness;Decreased knowledge of precautions;Cardiopulmonary status limiting activity;Decreased skin integrity;Obesity ? ?   ?  ?PT Treatment Interventions DME instruction;Gait training;Functional mobility training;Therapeutic activities;Therapeutic exercise;Balance training;Patient/family education   ? ?PT Goals (Current goals can be found in the Care Plan section)  ?Acute Rehab PT Goals ?Patient Stated Goal: to go home ?PT Goal Formulation: With patient ?Time For Goal Achievement: 07/01/21 ?Potential to Achieve Goals: Fair ? ?  ?Frequency Min 3X/week ?  ? ? ?Co-evaluation   ?  ?  ?  ?  ? ? ?  ?AM-PAC PT "6 Clicks" Mobility  ?Outcome Measure Help needed turning from your back to your side while in a flat bed without using bedrails?: None ?Help needed moving  from lying on your back to sitting on the side of a flat bed without using bedrails?: None ?Help needed moving to and from a bed to a chair (including a wheelchair)?: Total ?Help needed standing up from a chair using your arms (e.g., wheelchair or bedside chair)?: Total ?Help needed to walk in hospital room?: Total ?Help needed climbing 3-5 steps with a railing? : Total ?6 Click Score: 12 ? ?  ?End of Session Equipment Utilized During Treatment: Gait belt;Oxygen ?Activity Tolerance: Patient limited by fatigue ?Patient left: with call bell/phone within reach;in bed;with nursing/sitter in room ?Nurse Communication: Mobility status;Need for lift equipment ?PT Visit Diagnosis: Unsteadiness on feet (R26.81);Muscle weakness (generalized) (M62.81) ?  ? ?Time: [id number] ?PT Time Calculation (min) (ACUTE ONLY): 49 min ? ? ?Charges:   PT Evaluation ?$PT Eval Moderate Complexity: 1 Mod ?PT Treatments ?$Gait  Training: 23-37 mins ?  ?   ? ? ?Felicia Warren M,PT ?Acute Rehab Services ?[phone] ?[phone] (pager)  ? ?Alvira Philips ?06/17/2021, 1:28 PM ? ?

## 2021-06-17 NOTE — Assessment & Plan Note (Addendum)
Due to hypercapnia?  CT head and basic labs, UDS, TSH, ammonia  and B12 without significant finding.  Does not seem to be on meds with risk for encephalopathy.  Low suspicion for infectious process.  She is awake and oriented x4 but with limited insight.  Deemed to lack medical decision-making capacity by psychiatry on admission. ?-Minimum oxygen to keep saturation above 88% and BiPAP/CPAP at night. ?-Reorientation and delirium precaution ?-Psychiatry consulted to reassess capacity at [facility] request ?

## 2021-06-17 NOTE — Consult Note (Signed)
?  Felicia Warren is a 51 y.o. female admitted medically for acute respiratory failure and self-neglect. She carries the psychiatric diagnoses of depression and has a past medical history of  obesity, OSA, pulmonary embolism. Notably family filled out IVC to have her evaluated at the hospital as she refused to go to the hospital of her own accord. Psychiatry was consulted for depression by Katherine Mantle.  ? ?Patient was seen and attempted to assess however she declined further psychiatric evaluation by this nurse practitioner. Patient reports that their was big misunderstanding and she is not interested in psychiatric interview/evaluation at this time. She denies any acute psychiatric concerns, clinical psychiatric symptoms that warrant further inpatient psychiatric consult. ? ?Prior to attempt to evaluate patient, she was on BiPAP.  Respiratory therapist had been paged to have a trial of BiPAP in which she remove the BiPAP from the patient, and placed her on nasal cannula.  During the attempt to interview patient, it was notable desaturations in the 80s, with increased work of breathing and saturations would drop with talking.  Patient did express wanting to focus on her breathing, and wish to terminate interview any further as our services are not needed.  ? ?-Patient continues to remain under involuntary commitment at this time As she has life-threatening medical issues, limited insight, likely developing delirium, hypoxemia, and was requested to leave AMA on yesterday's attempt to evaluate.  ? ?-May continue safety sitter at this time, as involuntary commitment order remains in effect.  However it should be noted that IVC is for medical condition, and not psychiatric condition at this time. ? ?-Psychiatry will sign off, please reconsult with any new changes, concerns regarding acute psychiatric presentation of this patient. ?-Recommend TOC consult referral for outpatient psychiatric services, and to address family's  concern regarding depression. ? ?   ?

## 2021-06-17 NOTE — Assessment & Plan Note (Deleted)
Resolved

## 2021-06-17 NOTE — Evaluation (Signed)
Clinical/Bedside Swallow Evaluation ?Patient Details  ?Name: Felicia Warren ?MRN: [id number] ?Date of Birth: [birth year] ? ?Today's Date: 06/17/2021 ?Time: SLP Start Time (ACUTE ONLY): 1653 SLP Stop Time (ACUTE ONLY): 1725 ?SLP Time Calculation (min) (ACUTE ONLY): 32 min ? ?Past Medical History:  ?Past Medical History:  ?Diagnosis Date  ? Acute respiratory failure with hypoxia and hypercarbia (HCC)   ? AKI (acute kidney injury) (HCC)   ? Community acquired pneumonia   ? Demand ischemia (HCC)   ? Diarrhea   ? Hypercapnic respiratory failure (HCC) 06/25/2020  ? Hypoglycemia   ? Hypokalemia   ? Morbid obesity (HCC)   ? Normocytic anemia   ? OSA treated with BiPAP   ? Pulmonary embolism (HCC)   ? Transaminitis   ? ?Past Surgical History: History reviewed. No pertinent surgical history. ?HPI:  ?Per MD notes "51 year old F with PMH of OSA on CPAP, HTN, morbid obesity and PE not on AC brought to ED by family members under IVC due to self-neglect and inability to get off the floor and refusing medical attention, and admitted for acute hypoxic respiratory failure.  Basic labs including CBC and CMP without significant finding.  CT head without acute finding.  Troponin negative x2.  BNP 200.  CTA chest negative for PE but body wall edema and signs of chronic pulmonary hypertension."  CXR showed Cardiomegaly, vascular congestion..  Swallow eval ordered.  Pt has been seen by this SLP in May 2022 when she was in the hospital with respiratory failure.  She had acute dysphagia that resolved with medical improvement. She denies worsening issues with swallowing since May 2022 and reports she is "careful" - and takes her potassium pill melted in liquid.  ?  ?Assessment / Plan / Recommendation  ?Clinical Impression ? Patient presents with normal oropharyngeal swallow based on clinical swallow evaluation. She was able to self feed - and passed 3 ounce [place] water screen without issue. Demonstrates strong voice and cough. NO indication of  aspiration/dysphagia noted with 4 ounces juice, 6 ounces water, 2 bites of sandwich *pt did not want more, nor 3 bites of applesauce. Swallow judged to be clinically timely without indication of retention.  Pt takes her potassium "melted" with liquids for ease of swallow.  Recommend regular/thin diet with general precautions. No SLP follow up indicated at this time. Thanks for this consult. ?SLP Visit Diagnosis: Dysphagia, unspecified (R13.10) ?   ?Aspiration Risk ? Mild aspiration risk  ?  ?Diet Recommendation Regular;Thin liquid  ? ?Liquid Administration via: Cup;Straw ?Medication Administration: Other (Comment) (defer to pt) ?Supervision: Patient able to self feed ?Compensations: Slow rate;Small sips/bites ?Postural Changes: Seated upright at 90 degrees;Remain upright for at least 30 minutes after po intake  ?  ?Other  Recommendations Oral Care Recommendations: Oral care BID   ? ?Recommendations for follow up therapy are one component of a multi-disciplinary discharge planning process, led by the attending physician.  Recommendations may be updated based on patient status, additional functional criteria and insurance authorization. ? ?Follow up Recommendations No SLP follow up  ? ? ?  ?Assistance Recommended at Discharge None  ?Functional Status Assessment Patient has not had a recent decline in their functional status  ?Frequency and Duration    ? N/a ?  ?   ? ?Prognosis    ?N/a ?  ? ?Swallow Study   ?General Date of Onset: 06/17/21 ?HPI: Per MD notes "51 year old F with PMH of OSA on CPAP, HTN, morbid obesity and PE not on AC brought  to ED by family members under IVC due to self-neglect and inability to get off the floor and refusing medical attention, and admitted for acute hypoxic respiratory failure.  Basic labs including CBC and CMP without significant finding.  CT head without acute finding.  Troponin negative x2.  BNP 200.  CTA chest negative for PE but body wall edema and signs of chronic pulmonary  hypertension."  CXR showed Cardiomegaly, vascular congestion..  Swallow eval ordered.  Pt has been seen by this SLP in May 2022 when she was in the hospital with respiratory failure.  She had acute dysphagia that resolved with medical improvement. She denies worsening issues with swallowing since May 2022 and reports she is "careful" - and takes her potassium pill melted in liquid. ?Type of Study: Bedside Swallow Evaluation ?Previous Swallow Assessment: May 2022 acute dysphagia, dysphonia - resolved ?Diet Prior to this Study: NPO ?Temperature Spikes Noted: No ?Respiratory Status: Nasal cannula ?History of Recent Intubation: No ?Behavior/Cognition: Alert;Cooperative;Pleasant mood ?Oral Cavity Assessment: Within Functional Limits ?Oral Care Completed by SLP: No (but advised her to conduct oral care am and pm with toothbrush) ?Oral Cavity - Dentition: Adequate natural dentition ?Vision: Functional for self-feeding ?Self-Feeding Abilities: Able to feed self ?Patient Positioning: Partially reclined (Sizewise bed was not working properly thus propped pt up in bed) ?Baseline Vocal Quality: Normal ?Volitional Cough: Strong ?Volitional Swallow: Able to elicit  ?  ?Oral/Motor/Sensory Function Overall Oral Motor/Sensory Function: Within functional limits   ?Ice Chips Ice chips: Not tested   ?Thin Liquid Thin Liquid: Within functional limits ?Presentation: Straw  ?  ?Nectar Thick Nectar Thick Liquid: Not tested   ?Honey Thick Honey Thick Liquid: Not tested   ?Puree Puree: Within functional limits ?Presentation: Self Fed;Spoon   ?Solid ? ? ?  Solid: Within functional limits ?Presentation: Self Fed  ? ?  ? ?Felicia Warren ?06/17/2021,6:10 PM ? ?Rolena Infante, MS CCC SLP ?Acute Rehab Services ?Office [phone] ?Pager [phone] ? ? ?

## 2021-06-17 NOTE — Assessment & Plan Note (Addendum)
Not on anticoagulation at home.  Refusing subcu Lovenox for VTE prophylaxis. ?-Continue Xarelto 10 mg daily.   ?

## 2021-06-17 NOTE — Progress Notes (Addendum)
Dr. Alanda Slim gave order for ice chips.  ?

## 2021-06-17 NOTE — Assessment & Plan Note (Signed)
Body mass index is 68.66 kg/m?. ?-Encourage lifestyle change to lose weight. ?

## 2021-06-17 NOTE — Assessment & Plan Note (Addendum)
See respiratory failure 

## 2021-06-17 NOTE — Assessment & Plan Note (Addendum)
Likely due to OSA/possible OHS.  She also carries history of right heart failure.  ABG 7.39/71/77/43 suggesting chronic respiratory acidosis.  CTA chest negative for PE or infectious process but concerning body wall edema.  BNP 200, could be falsely low.  Serial troponin negative.  Fluid status difficult to assess due to body habitus.  She has history of diastolic CHF but not compliant with torsemide.  TTE with normal LVEF and G2 DD. ?-Continue BiPAP as needed and at night. ?-Minimum oxygen to keep saturation above 88% ?-Continue diuretics for CHF ?

## 2021-06-17 NOTE — Progress Notes (Signed)
Patient moved to room 1404 by bed with this RN and [place], NT. Patient alert with no distress noted. Rama, RN at bedside to help receive patient.  ?

## 2021-06-17 NOTE — Hospital Course (Signed)
51 year old F with PMH of OSA on CPAP, HTN, morbid obesity and PE not on AC brought to ED by family members under IVC due to self-neglect and inability to get off the floor and refusing medical attention, and admitted for acute hypoxic respiratory failure.  Basic labs including CBC and CMP without significant finding.  CT head without acute finding.  Troponin negative x2.  BNP 200.  CTA chest negative for PE but body wall edema and signs of chronic pulmonary hypertension.  Psychiatry consulted.  ?

## 2021-06-17 NOTE — Plan of Care (Signed)
  Problem: Education: Goal: Knowledge of General Education information will improve Description Including pain rating scale, medication(s)/side effects and non-pharmacologic comfort measures Outcome: Progressing   Problem: Health Behavior/Discharge Planning: Goal: Ability to manage health-related needs will improve Outcome: Progressing   

## 2021-06-18 DIAGNOSIS — G9341 Metabolic encephalopathy: Secondary | ICD-10-CM | POA: Diagnosis not present

## 2021-06-18 DIAGNOSIS — I5032 Chronic diastolic (congestive) heart failure: Secondary | ICD-10-CM

## 2021-06-18 DIAGNOSIS — Z046 Encounter for general psychiatric examination, requested by authority: Secondary | ICD-10-CM

## 2021-06-18 DIAGNOSIS — D509 Iron deficiency anemia, unspecified: Secondary | ICD-10-CM | POA: Insufficient documentation

## 2021-06-18 DIAGNOSIS — R7303 Prediabetes: Secondary | ICD-10-CM

## 2021-06-18 DIAGNOSIS — G4733 Obstructive sleep apnea (adult) (pediatric): Secondary | ICD-10-CM

## 2021-06-18 DIAGNOSIS — J9621 Acute and chronic respiratory failure with hypoxia: Secondary | ICD-10-CM | POA: Diagnosis not present

## 2021-06-18 DIAGNOSIS — R5381 Other malaise: Secondary | ICD-10-CM

## 2021-06-18 DIAGNOSIS — Z9989 Dependence on other enabling machines and devices: Secondary | ICD-10-CM

## 2021-06-18 DIAGNOSIS — J9622 Acute and chronic respiratory failure with hypercapnia: Secondary | ICD-10-CM

## 2021-06-18 DIAGNOSIS — R4689 Other symptoms and signs involving appearance and behavior: Secondary | ICD-10-CM

## 2021-06-18 DIAGNOSIS — D649 Anemia, unspecified: Secondary | ICD-10-CM

## 2021-06-18 DIAGNOSIS — Z86711 Personal history of pulmonary embolism: Secondary | ICD-10-CM

## 2021-06-18 LAB — CBC
HCT: 40.2 % (ref 36.0–46.0)
Hemoglobin: 10.6 g/dL — ABNORMAL LOW (ref 12.0–15.0)
MCH: 21.8 pg — ABNORMAL LOW (ref 26.0–34.0)
MCHC: 26.4 g/dL — ABNORMAL LOW (ref 30.0–36.0)
MCV: 82.5 fL (ref 80.0–100.0)
Platelets: 246 10*3/uL (ref 150–400)
RBC: 4.87 MIL/uL (ref 3.87–5.11)
RDW: 21.1 % — ABNORMAL HIGH (ref 11.5–15.5)
WBC: 6.1 10*3/uL (ref 4.0–10.5)
nRBC: 0 % (ref 0.0–0.2)

## 2021-06-18 LAB — ECHOCARDIOGRAM COMPLETE
AR max vel: 3.45 cm2
AV Area VTI: 3.91 cm2
AV Area mean vel: 3.52 cm2
AV Mean grad: 9 mmHg
AV Peak grad: 16.6 mmHg
Ao pk vel: 2.04 m/s
Area-P 1/2: 4.06 cm2
Calc EF: 60.8 %
Height: 64 in
MV VTI: 2.61 cm2
S' Lateral: 3.3 cm
Single Plane A2C EF: 60.6 %
Single Plane A4C EF: 59.8 %
Weight: 6400 oz

## 2021-06-18 LAB — RENAL FUNCTION PANEL
Albumin: 3 g/dL — ABNORMAL LOW (ref 3.5–5.0)
Anion gap: 5 (ref 5–15)
BUN: 7 mg/dL (ref 6–20)
CO2: 39 mmol/L — ABNORMAL HIGH (ref 22–32)
Calcium: 8 mg/dL — ABNORMAL LOW (ref 8.9–10.3)
Chloride: 98 mmol/L (ref 98–111)
Creatinine, Ser: 0.84 mg/dL (ref 0.44–1.00)
GFR, Estimated: >60 mL/min (ref >60)
Glucose, Bld: 100 mg/dL — ABNORMAL HIGH (ref 70–99)
Phosphorus: 3.1 mg/dL (ref 2.5–4.6)
Potassium: 3.3 mmol/L — ABNORMAL LOW (ref 3.5–5.1)
Sodium: 142 mmol/L (ref 135–145)

## 2021-06-18 LAB — HEMOGLOBIN A1C
Hgb A1c MFr Bld: 6.2 % — ABNORMAL HIGH (ref 4.8–5.6)
Mean Plasma Glucose: 131.24 mg/dL

## 2021-06-18 LAB — AMMONIA: Ammonia: 19 umol/L (ref 9–35)

## 2021-06-18 LAB — MAGNESIUM: Magnesium: 1.8 mg/dL (ref 1.7–2.4)

## 2021-06-18 MED ORDER — RIVAROXABAN 10 MG PO TABS
10.0000 mg | ORAL_TABLET | Freq: Every day | ORAL | Status: DC
  Administered 2021-06-19 – 2021-06-26 (×8): 10 mg via ORAL
  Filled 2021-06-18 (×8): qty 1

## 2021-06-18 MED ORDER — POTASSIUM CHLORIDE CRYS ER 20 MEQ PO TBCR
40.0000 meq | EXTENDED_RELEASE_TABLET | ORAL | Status: AC
  Administered 2021-06-18 (×2): 40 meq via ORAL
  Filled 2021-06-18 (×2): qty 2

## 2021-06-18 MED ORDER — TORSEMIDE 20 MG PO TABS
20.0000 mg | ORAL_TABLET | Freq: Every day | ORAL | Status: DC
  Administered 2021-06-18: 20 mg via ORAL
  Filled 2021-06-18 (×2): qty 1

## 2021-06-18 NOTE — Assessment & Plan Note (Addendum)
Recent Labs  ?  07/15/20 ?0404 07/16/20 ?0406 07/17/20 ?0357 07/22/20 ?0358 06/15/21 ?2328 06/16/21 ?1046 06/17/21 ?0241 06/18/21 ?0429 06/19/21 ?0451 06/20/21 ?0434  ?HGB 10.2* 10.3* 10.9* 11.2* 12.1 12.0 10.9* 10.6* 10.7* 10.6*  ?H&H relatively stable and at baseline after initial drop likely from hemodilution.  Anemia panel suggested iron deficiency with ferritin of 12 and elevated TIBC. ?-IV ferric gluconate 250 mg on 4/28 ?-Continue monitoring ? ?

## 2021-06-18 NOTE — NC FL2 (Signed)
?[place] MEDICAID FL2 LEVEL OF CARE SCREENING TOOL  ?  ? ?IDENTIFICATION  ?Patient Name: ?Felicia Warren Birthdate: 11/28/1970 Sex: female Admission Date (Current Location): ?06/15/2021  ?[place] Number: ? [place] ?  Facility and Address:  ?[facility],  [address], [place] [address] ?     Provider Number: ?[id number]  ?Attending Physician Name and Address:  ?Almon Hercules, MD ? Relative Name and Phone Number:  ?Migdalia Dk sister [phone] ?   ?Current Level of Care: ?Hospital Recommended Level of Care: ?Skilled Nursing Facility Prior Approval Number: ?  ? ?Date Approved/Denied: ?  PASRR Number: ?  ? ?Discharge Plan: ?SNF ?  ? ?Current Diagnoses: ?Patient Active Problem List  ? Diagnosis Date Noted  ? Physical deconditioning 06/18/2021  ? Normocytic anemia 06/18/2021  ? Chronic diastolic CHF (congestive heart failure) (HCC) 06/18/2021  ? Involuntary commitment 06/17/2021  ? Self neglect 06/17/2021  ? OSA on CPAP 06/17/2021  ? Acute on chronic respiratory failure with hypoxia and hypercapnia (HCC) 06/16/2021  ? BMI 60.0-69.9, adult (HCC) 09/17/2020  ? Prediabetes 09/17/2020  ? Hypokalemia   ? Acute metabolic encephalopathy 07/05/2020  ? History of pulmonary embolism 07/05/2020  ? ? ?Orientation RESPIRATION BLADDER Height & Weight   ?  ?Self, Time, Situation, Place ? O2 Continent Weight: (!) 181.4 kg ?Height:  5\' 4"  (162.6 cm)  ?BEHAVIORAL SYMPTOMS/MOOD NEUROLOGICAL BOWEL NUTRITION STATUS  ?    Continent Diet (1500 fluid restriction)  ?AMBULATORY STATUS COMMUNICATION OF NEEDS Skin   ?Limited Assist Verbally Normal ?  ?  ?  ?    ?     ?     ? ? ?Personal Care Assistance Level of Assistance  ?Bathing, Feeding, Dressing Bathing Assistance: Limited assistance ?Feeding assistance: Limited assistance ?Dressing Assistance: Limited assistance ?   ? ?Functional Limitations Info  ?Sight, Hearing, Speech Sight Info: Adequate ?Hearing Info: Adequate ?Speech Info: Adequate  ? ? ?SPECIAL CARE  FACTORS FREQUENCY  ?PT (By licensed PT), OT (By licensed OT)   ?  ?PT Frequency:  (5x week) ?OT Frequency:  (5x week) ?  ?  ?  ?   ? ? ?Contractures Contractures Info: Not present  ? ? ?Additional Factors Info  ?Code Status, Allergies Code Status Info:  (Full) ?Allergies Info:  (NKA) ?  ?  ?  ?   ? ?Current Medications (06/18/2021):  This is the current hospital active medication list ?Current Facility-Administered Medications  ?Medication Dose Route Frequency Provider Last Rate Last Admin  ? 0.9 %  sodium chloride infusion  250 mL Intravenous PRN 06/20/2021 T, MD      ? acetaminophen (TYLENOL) tablet 650 mg  650 mg Oral Q6H PRN Candelaria Stagers T, MD      ? Or  ? acetaminophen (TYLENOL) suppository 650 mg  650 mg Rectal Q6H PRN Candelaria Stagers, MD      ? Chlorhexidine Gluconate Cloth 2 % PADS 6 each  6 each Topical Daily Almon Hercules, MD   6 each at 06/18/21 1307  ? ondansetron (ZOFRAN) tablet 4 mg  4 mg Oral Q6H PRN 06/20/21 T, MD      ? Or  ? ondansetron (ZOFRAN) injection 4 mg  4 mg Intravenous Q6H PRN Gonfa, Taye T, MD      ? pantoprazole (PROTONIX) EC tablet 40 mg  40 mg Oral QHS Candelaria Stagers T, MD   40 mg at 06/17/21 2238  ? [START ON 06/19/2021] rivaroxaban (XARELTO)  tablet 10 mg  10 mg Oral Daily Lynden Ang, RPH      ? sodium chloride flush (NS) 0.9 % injection 3 mL  3 mL Intravenous Q12H Candelaria Stagers T, MD   3 mL at 06/18/21 1308  ? sodium chloride flush (NS) 0.9 % injection 3 mL  3 mL Intravenous PRN Candelaria Stagers T, MD      ? torsemide (DEMADEX) tablet 20 mg  20 mg Oral Daily Almon Hercules, MD      ? ? ? ?Discharge Medications: ?Please see discharge summary for a list of discharge medications. ? ?Relevant Imaging Results: ? ?Relevant Lab Results: ? ? ?Additional Information ?[phone] ? ?Lanier Clam, RN ? ? ? ? ?

## 2021-06-18 NOTE — Assessment & Plan Note (Signed)
A1c 6.2%.  BMP glucose within acceptable range. ?

## 2021-06-18 NOTE — Hospital Course (Addendum)
51 year old F with PMH of OSA/OHS on CPAP?,  HTN, morbid obesity and PE not on AC brought to ED by family members under IVC due to self-neglect and inability to get off the floor and refusing medical attention, and admitted for acute hypoxic respiratory failure.  Basic labs including CBC and CMP without significant finding.  CT head without acute finding.  Troponin negative x2.  BNP 200.  CTA chest negative for PE but body wall edema and signs of chronic pulmonary hypertension.  Echocardiogram ordered.  Psychiatry consulted due to self-neglect.  ? ?Patient was transferred to stepdown unit due to encephalopathy and respiratory failure with hypoxia requiring BiPAP.  ABG suggestive for chronic respiratory acidosis with hypercapnia.  TTE with LVEF of 60 to 65%, G2-DD and normal RVSF.  Respiratory status and mental status improved, and she was transferred to progressive care. ? ?Per psychiatry, patient has no psychiatric condition but IVC ineffective due to lack of capacity in the setting of encephalopathy/delirium.  Psychiatry signed off.  ? ?Therapy recommends SNF unless she has 24-hour care.  Patient prefers to go back to her extended stay but remains deconditioned and still does not seem to have medical decision-making capacity.  Psychiatry reconsulted at [facility] request to reevaluate capacity.  She is otherwise medically optimized.  Psychiatry reevaluated and noted patient capacity waxes and wanes-has generally lacked decisional capacity over multiple evaluations due to poor insight into medical condition, patient has good communication but no understanding,reasoning or rational decision making. ?She is waiting for replacement, finally accepted and has a passer number and insurance approval and is being discharged to skilled nursing facility. ?

## 2021-06-18 NOTE — Progress Notes (Addendum)
ANTICOAGULATION CONSULT NOTE - Initial Consult ? ?Pharmacy Consult for Xarelto ?Indication: VTE prophylaxis ? ?No Known Allergies ? ?Patient Measurements: ?Height: 5\' 4"  (162.6 cm) ?Weight: (!) 181.4 kg (400 lb) ?IBW/kg (Calculated) : 54.7 ? ? ?Vital Signs: ?Temp: 98.5 ?F (36.9 ?C) (04/27 1158) ?Temp Source: Oral (04/27 1158) ?BP: 125/58 (04/27 1158) ?Pulse Rate: 95 (04/27 1158) ? ?Labs: ?Recent Labs  ?  06/16/21 ?0030 06/16/21 ?0230 06/16/21 ?1046 06/17/21 ?0241 06/18/21 ?0429  ?HGB  --   --  12.0 10.9* 10.6*  ?HCT  --   --  44.0 42.4 40.2  ?PLT  --   --  271 246 246  ?CREATININE  --   --  0.75 0.71 0.84  ?CKTOTAL  --  98  --   --   --   ?TROPONINIHS 12 15  --   --   --   ? ? ?Estimated Creatinine Clearance: 130.4 mL/min (by C-G formula based on SCr of 0.84 mg/dL). ? ? ?Medical History: ?Past Medical History:  ?Diagnosis Date  ? Acute respiratory failure with hypoxia and hypercarbia (HCC)   ? AKI (acute kidney injury) (HCC)   ? Community acquired pneumonia   ? Demand ischemia (HCC)   ? Diarrhea   ? Hypercapnic respiratory failure (HCC) 06/25/2020  ? Hypoglycemia   ? Hypokalemia   ? Morbid obesity (HCC)   ? Normocytic anemia   ? OSA treated with BiPAP   ? Pulmonary embolism (HCC)   ? Transaminitis   ? ? ?Medications:  ?Medications Prior to Admission  ?Medication Sig Dispense Refill Last Dose  ? Multiple Vitamin (MULTIVITAMIN WITH MINERALS) TABS tablet Take 1 tablet by mouth daily. (Patient not taking: Reported on 06/16/2021)   Not Taking  ? pantoprazole (PROTONIX) 40 MG tablet Take 1 tablet (40 mg total) by mouth at bedtime. (Patient not taking: Reported on 06/16/2021) 30 tablet 2 Not Taking  ? potassium chloride SA (KLOR-CON) 20 MEQ tablet Take 2 tablets (40 mEq total) by mouth daily. (Patient not taking: Reported on 06/16/2021) 30 tablet 11 Not Taking  ? torsemide (DEMADEX) 20 MG tablet Take 2 tablets (40 mg total) by mouth 2 (two) times daily. (Patient not taking: Reported on 06/16/2021) 60 tablet 11 Not Taking   ? ? ?Assessment: ?Pharmacy consulted to dose Xarelto for VTE prophylaxis for this 51 yo female with hypertension, morbid obesity, and PE who refuses current order for enoxaparin for VTE prophylaxis.  No current PTA anticoagulation listed. ?08/2020 notes from office visit state following May 2022 hospitalization, she took 1 month of Xarelto and did not refill due to cost and was planned to resume Xarelto at no charge through June 2022 but did not refill ?09/2020 Xarelto stopped ? ?4/25 DG Chest: cardiomegaly, vascular congestion ?4/25 CT angio Chest PE: No evidence of PE ? ?Goal of Therapy:  ?VTE prophylaxis ?Monitor platelets by anticoagulation protocol: Yes ?  ?Plan:  ?Discontinue Lovenox ?Xarelto 10 mg po qday during hospitalization ?Pharmacy will sign off consult and monitor CBC, signs/symptoms of bleeding ? ? ?5/25, PharmD, BCPS ?Clinical Pharmacist ? ?Please utilize Amion for appropriate phone number to reach the unit pharmacist [facility]) ?06/18/2021 1:57 PM ? ? ? ?

## 2021-06-18 NOTE — Evaluation (Signed)
Occupational Therapy Evaluation ?Patient Details ?Name: Felicia Warren ?MRN: [id number] ?DOB: 04/29/1970 ?Today's Date: 06/18/2021 ? ? ?History of Present Illness Felicia Warren is a 51 y.o. female admitted 4/24 brought into the emergency room by her family as she had been in multiple room on the floor for several days not being able to get off on the floor refusing medical attention.  Most of the history is obtained from the chart as the patient denies all this.  Family states that she is depressed due to not having a job.  She has been IVC by the ED.  PMH: hypertension  ? ?Clinical Impression ?  ?Patient living in a hotel and at baseline can perform self care tasks and mobility mod I at baseline. Patient states she usually uses a cane "its easier to bring around" but will use walker as needed. Per chart review patient not caring for herself and found down on the floor in hotel room. Currently patient is min G for sit to stand and able to take few steps with rolling walker. Tolerated standing for ~4 minutes before returning to seated position. On room air patient desat to 84% with activity with RN made aware and O2 donned for remainder of session. Patient needing mod A for lower body dressing due to fatigue. Due to increased assistance needed would recommend 24/7 assistance initially at D/C, if this cannot be arranged patient likely needing short term rehab. Acute OT to follow.   ?   ? ?Recommendations for follow up therapy are one component of a multi-disciplinary discharge planning process, led by the attending physician.  Recommendations may be updated based on patient status, additional functional criteria and insurance authorization.  ? ?Follow Up Recommendations ? Home health OT (if patient has 24/7 assistance, if not SNF)  ?  ?Assistance Recommended at Discharge Frequent or constant Supervision/Assistance  ?Patient can return home with the following A little help with walking and/or transfers;A lot of help with  bathing/dressing/bathroom ? ?  ?Functional Status Assessment ? Patient has had a recent decline in their functional status and demonstrates the ability to make significant improvements in function in a reasonable and predictable amount of time.  ?Equipment Recommendations ? None recommended by OT  ?  ?Recommendations for Other Services   ? ? ?  ?Precautions / Restrictions Precautions ?Precautions: Fall ?Restrictions ?Weight Bearing Restrictions: No  ? ?  ? ?Mobility Bed Mobility ?Overal bed mobility: Needs Assistance ?Bed Mobility: Supine to Sit, Sit to Supine ?  ?  ?Supine to sit: Modified independent (Device/Increase time) ?Sit to supine: Mod assist ?  ?General bed mobility comments: Patient needing assistance to lift legs onto bed ?  ? ?Transfers ?  ?  ?  ?  ?  ?  ?  ?  ?  ?  ?  ? ?  ?Balance Overall balance assessment: Needs assistance ?Sitting-balance support: No upper extremity supported, Feet supported ?Sitting balance-Leahy Scale: Fair ?  ?  ?Standing balance support: Bilateral upper extremity supported, During functional activity ?Standing balance-Leahy Scale: Poor ?  ?  ?  ?  ?  ?  ?  ?  ?  ?  ?  ?  ?   ? ?ADL either performed or assessed with clinical judgement  ? ?ADL Overall ADL's : Needs assistance/impaired ?Eating/Feeding: Independent ?  ?Grooming: Set up;Sitting ?  ?Upper Body Bathing: Set up;Sitting ?  ?Lower Body Bathing: Moderate assistance;Sitting/lateral leans;Sit to/from stand ?  ?Upper Body Dressing : Set up;Sitting ?  ?Lower  Body Dressing: Moderate assistance;Sitting/lateral leans;Sit to/from stand ?Lower Body Dressing Details (indicate cue type and reason): With leg supported on edge of bed and increased effort patient able to don L sock. Patient needing assistance to don R sock due to fatigue ?Toilet Transfer: Min guard;Rolling walker (2 wheels) ?Toilet Transfer Details (indicate cue type and reason): Patient able to power up to standing from elevated bed height at min G level and take  steps with rolling walker ?Toileting- Clothing Manipulation and Hygiene: Moderate assistance;Sitting/lateral lean;Sit to/from stand ?  ?  ?  ?Functional mobility during ADLs: Min guard;Rolling walker (2 wheels) ?General ADL Comments: Patient needing increased assistance with self care tasks due to decreased activity tolerance. O2 dropped to 84% on room air  ? ? ? ?Vision   ?   ?   ?Perception   ?  ?Praxis   ?  ? ?Pertinent Vitals/Pain Pain Assessment ?Pain Assessment: No/denies pain  ? ? ? ?Hand Dominance Right ?  ?Extremity/Trunk Assessment Upper Extremity Assessment ?Upper Extremity Assessment: Overall WFL for tasks assessed ?  ?Lower Extremity Assessment ?Lower Extremity Assessment: Defer to PT evaluation ?  ?Cervical / Trunk Assessment ?Cervical / Trunk Assessment: Other exceptions ?Cervical / Trunk Exceptions: body habitus ?  ?Communication Communication ?Communication: No difficulties ?  ?Cognition Arousal/Alertness: Awake/alert ?Behavior During Therapy: [facility] for tasks assessed/performed ?Overall Cognitive Status: No family/caregiver present to determine baseline cognitive functioning ?  ?  ?  ?  ?  ?  ?  ?  ?  ?  ?  ?  ?  ?  ?  ?  ?General Comments: Patient following directions appropriately with increased time ?  ?  ?General Comments    ? ?  ?Exercises   ?  ?Shoulder Instructions    ? ? ?Home Living Family/patient expects to be discharged to:: Private residence ?Living Arrangements: Alone ?Available Help at Discharge: Family;Available PRN/intermittently ?Type of Home: Other(Comment) (hotel) ?Home Access: Level entry ?  ?  ?Home Layout: One level ?  ?  ?  ?  ?Bathroom Toilet: Handicapped height (in hotel room PTA) ?  ?  ?Home Equipment: Rollator (4 wheels);Cane - single point ?  ?Additional Comments: Pt unreliable historian.  States she came to [facility] in April 2022. Chart states she was staying at hotel but does have sister and brother in area. ?  ? ?  ?Prior Functioning/Environment Prior Level of Function :  Independent/Modified Independent (lost job 3 weeks ago) ?  ?  ?  ?  ?  ?  ?  ?  ?  ? ?  ?  ?OT Problem List: Obesity;Cardiopulmonary status limiting activity;Decreased safety awareness;Decreased activity tolerance;Impaired balance (sitting and/or standing) ?  ?   ?OT Treatment/Interventions: Self-care/ADL training;Balance training;Patient/family education;Therapeutic activities  ?  ?OT Goals(Current goals can be found in the care plan section) Acute Rehab OT Goals ?Patient Stated Goal: Get up and move ?OT Goal Formulation: With patient ?Time For Goal Achievement: 07/02/21 ?Potential to Achieve Goals: Good  ?OT Frequency: Min 2X/week ?  ? ?Co-evaluation   ?  ?  ?  ?  ? ?  ?AM-PAC OT "6 Clicks" Daily Activity     ?Outcome Measure Help from another person eating meals?: None ?Help from another person taking care of personal grooming?: A Little ?Help from another person toileting, which includes using toliet, bedpan, or urinal?: A Lot ?Help from another person bathing (including washing, rinsing, drying)?: A Lot ?Help from another person to put on and taking off regular upper  body clothing?: A Little ?Help from another person to put on and taking off regular lower body clothing?: A Lot ?6 Click Score: 16 ?  ?End of Session Equipment Utilized During Treatment: Rolling walker (2 wheels);Oxygen ?Nurse Communication: Mobility status;Other (comment) (RN present to obtain vitals) ? ?Activity Tolerance: Patient tolerated treatment well ?Patient left: in bed;with call bell/phone within reach;with nursing/sitter in room ? ?OT Visit Diagnosis: Unsteadiness on feet (R26.81)  ?              ?Time: 2035-5974 ?OT Time Calculation (min): 23 min ?Charges:  OT General Charges ?$OT Visit: 1 Visit ?OT Evaluation ?$OT Eval Low Complexity: 1 Low ?OT Treatments ?$Self Care/Home Management : 8-22 mins ? ?Marlyce Huge OT ?OT pager: [phone] ? ?Carmelia Roller ?06/18/2021, 3:11 PM ?

## 2021-06-18 NOTE — Progress Notes (Signed)
?PROGRESS NOTE ? ?Felicia Warren [id number] DOB: 02-25-1970  ? ?PCP: Elsie Stain, MD ? ?Patient is from: Extended stay ? ?DOA: 06/15/2021 LOS: 2 ? ?Chief complaints ?Chief Complaint  ?Patient presents with  ? IVC  ?  ? ?Brief Narrative / Interim history: ?51 year old F with PMH of OSA on CPAP, HTN, morbid obesity and PE not on AC brought to ED by family members under IVC due to self-neglect and inability to get off the floor and refusing medical attention, and admitted for acute hypoxic respiratory failure.  Basic labs including CBC and CMP without significant finding.  CT head without acute finding.  Troponin negative x2.  BNP 200.  CTA chest negative for PE but body wall edema and signs of chronic pulmonary hypertension.  Echocardiogram ordered.  Psychiatry consulted due to self-neglect.  ? ?Patient was transferred to stepdown unit due to encephalopathy and respiratory failure with hypoxia requiring BiPAP.  ABG suggestive for chronic respiratory acidosis with hypercapnia.  TTE with LVEF of 60 to 65%, G2-DD and normal RVSF.  Respiratory status and mental status improved, and she was transferred to progressive care. ? ?Per psychiatry, patient has no psychiatric condition but IVC ineffective due to lack of capacity in the setting of encephalopathy/delirium.  Psychiatry signed off. ? ?Therapy recommends SNF if patient has no 24-hour care.  Patient does not seem to have safe disposition.  Per patient and sister, she seems to live at facility for homeless.  Patient also reports problem with the CPAP.  TOC investigating.  ?  ? ?Subjective: ?Seen and examined earlier this morning.  No major events overnight of this morning pains but she is asking when she could be released from the hospital.  She denies chest pain, GI or UTI symptoms.  She is awake but not quite alert.  She is oriented x4 but seems to have great insight into his situation.  She says she lives at facility but doesn't know the name or type. She says  her CPAP is locked and missing parts.  ? ?Objective: ?Vitals:  ? 06/18/21 0112 06/18/21 0454 06/18/21 0525 06/18/21 1158  ?BP: (!) 133/52 (!) 163/91 (!) 104/53 (!) 125/58  ?Pulse: (!) 104 87 (!) 101 95  ?Resp: (!) 26 18  18   ?Temp: 98.5 ?F (36.9 ?C) 98.8 ?F (37.1 ?C) 99.1 ?F (37.3 ?C) 98.5 ?F (36.9 ?C)  ?TempSrc: Oral Oral Oral Oral  ?SpO2: 94% 98% 93% 97%  ?Weight:      ?Height:      ? ? ?Examination: ? ?GENERAL: No apparent distress.  Nontoxic. ?HEENT: MMM.  Vision and hearing grossly intact.  ?NECK: Supple.  Difficult to assess JVD. ?RESP: 93% on 2 L.  No IWOB.  Fair aeration bilaterally but difficult exam due to body habitus. ?CVS:  RRR. Heart sounds normal.  ?ABD/GI/GU: BS+. Abd soft, NTND.  ?MSK/EXT:  Moves extremities. No apparent deformity. No edema.  ?SKIN: no apparent skin lesion or wound ?NEURO: Awake but not quite alert.  Oriented x4 but does not seem to have great insight.  No apparent focal neuro deficit. ?PSYCH: Calm. Normal affect.  ? ?Procedures:  ?None ? ?Microbiology summarized: ?COVID-19 and influenza PCR nonreactive. ?Blood cultures NGTD. ? ?Assessment and Plan: ?* Acute on chronic respiratory failure with hypoxia and hypercapnia (HCC) ?Likely due to OSA/possible OHS.  She also carries history of right heart failure.  ABG 7.39/71/77/43 suggesting chronic respiratory acidosis.  CTA chest negative for PE or infectious process but concerning body wall edema.  BNP  200, could be falsely low.  Serial troponin negative.  Fluid status difficult to assess due to body habitus.  She has history of diastolic CHF but not compliant with torsemide.  TTE with normal LVEF and G2 DD. ?-Continue BiPAP as needed and at night.  It is very important that she use CPAP at night! ?-Minimum oxygen to keep saturation above 88% ?-Diastolic CHF as below ?-TOC to investigate about patient's home CPAP and living condition ? ?Chronic diastolic CHF (congestive heart failure) ([place]) ?TTE with LVEF of 60 to 65% and G2 DD but no  other symptoms finding.  She does not look fluid overloaded on exam but difficult exam due to body habitus.  BNP 200 but it could be falsely low in the setting of morbid obesity.  Respiratory failure likely from obstructive process such as OSA/OHS.  Not compliant with his home torsemide. ?-Resume home torsemide at 20 mg daily ?-Strict intake and output and daily weight ?-Monitor renal functions and electrolytes ?-Sodium and fluid restriction. ? ?Involuntary commitment ?Patient was involuntary committed by family member/sister due to self-neglect and refusal to seek medical attention.  Evaluated by psychiatry and deemed to lack capacity but no psychiatric condition.  ?-IVC remains in effect due to lack of capacity in the setting of encephalopathy and possible delirium ?-Continue safety sitter ? ?Acute metabolic encephalopathy ?Due to hypercapnia?  CT head and basic labs, UDS, TSH, ammonia  and B12 without significant finding.  Does not seem to be on meds with risk for encephalopathy.  Low suspicion for infectious process.  She is awake and oriented x4 but not quite alert.  Poor insight.  No focal neurodeficit. ?-Basic encephalopathy labs ?-Minimum oxygen to keep saturation above 88%. ?-Reorientation and delirium precaution ? ?Self neglect ?Seen involuntary commitment ? ?Hypokalemia ?K3.3.  Mg 1.8. ?P.o. K-Dur 40x2 ? ?Normocytic anemia ?Recent Labs  ?  07/13/20 ?[id number] 07/14/20 ?[id number] 07/15/20 ?0404 07/16/20 ?0406 07/17/20 ?0357 07/22/20 ?0358 06/15/21 ?2328 06/16/21 ?1046 06/17/21 ?0241 06/18/21 ?0429  ?HGB 10.0* 10.3* 10.2* 10.3* 10.9* 11.2* 12.1 12.0 10.9* 10.6*  ?H&H relatively stable and at baseline after initial drop likely from hemodilution. ?-Continue monitoring ?-Check anemia panel in the morning ? ? ?Physical deconditioning ?PT/OT recommends SNF if patient has no 24/7 supervision which seems to be the case ? ?OSA on CPAP ?BiPAP as needed and at night ? ?Prediabetes ?A1c 6.2%.  BMP glucose within acceptable  range. ? ?BMI 60.0-69.9, adult ([place]) ?Body mass index is 68.66 kg/m?. ?-Encourage lifestyle change to lose weight. ? ?History of pulmonary embolism ?Not on anticoagulation at home.  Refusing subcu Lovenox for VTE prophylaxis. ?-Change subcu Lovenox to p.o. Xarelto ? ? ?DVT prophylaxis:  ?rivaroxaban (XARELTO) tablet 10 mg Start: 06/19/21 1000On subcu Lovenox ?rivaroxaban (XARELTO) tablet 10 mg  ?Code Status: Full code ?Family Communication: Updated patient's sister over the phone on 4/26 ?Level of care: Progressive ?Status is: Inpatient ?Remains inpatient appropriate because: Acute metabolic encephalopathy, respiratory failure and safe disposition ? ? ?Final disposition: TBD.  SNF? ?Consultants:  ?Psychiatry-signed off ? ?Sch Meds:  ?Scheduled Meds: ? Chlorhexidine Gluconate Cloth  6 each Topical Daily  ? pantoprazole  40 mg Oral QHS  ? [START ON 06/19/2021] rivaroxaban  10 mg Oral Daily  ? sodium chloride flush  3 mL Intravenous Q12H  ? torsemide  20 mg Oral Daily  ? ?Continuous Infusions: ? sodium chloride    ? ?PRN Meds:.sodium chloride, acetaminophen **OR** acetaminophen, ondansetron **OR** ondansetron (ZOFRAN) IV, sodium chloride flush ? ?Antimicrobials: ?Anti-infectives (From  admission, onward)  ? ? Start     Dose/Rate Route Frequency Ordered Stop  ? 06/16/21 0115  cefTRIAXone (ROCEPHIN) 1 g in sodium chloride 0.9 % 100 mL IVPB  Status:  Discontinued       ? 1 g ?200 mL/hr over 30 Minutes Intravenous  Once 06/16/21 0101 06/16/21 0102  ? 06/16/21 0115  azithromycin (ZITHROMAX) 500 mg in sodium chloride 0.9 % 250 mL IVPB       ? 500 mg ?250 mL/hr over 60 Minutes Intravenous  Once 06/16/21 0101 06/16/21 0501  ? 06/16/21 0115  cefTRIAXone (ROCEPHIN) 2 g in sodium chloride 0.9 % 100 mL IVPB       ? 2 g ?200 mL/hr over 30 Minutes Intravenous  Once 06/16/21 0102 06/16/21 0415  ? ?  ? ? ? ?I have personally reviewed the following labs and images: ?CBC: ?Recent Labs  ?Lab 06/15/21 ?2328 06/16/21 ?1046 06/17/21 ?0241  06/18/21 ?0429  ?WBC 6.8 6.4 6.0 6.1  ?NEUTROABS 5.3  --   --   --   ?HGB 12.1 12.0 10.9* 10.6*  ?HCT 44.7 44.0 42.4 40.2  ?MCV 80.7 81.5 83.8 82.5  ?PLT 256 271 246 246  ? ?BMP &GFR ?Recent Labs  ?Lab 06/15/21

## 2021-06-18 NOTE — Progress Notes (Signed)
Physical Therapy Treatment ?Patient Details ?Name: Felicia Warren ?MRN: [id number] ?DOB: Oct 11, 1970 ?Today's Date: 06/18/2021 ? ? ?History of Present Illness Bettylou Tagliaferro is a 51 y.o. female admitted 4/24 brought into the emergency room by her family as she had been in multiple room on the floor for several days not being able to get off on the floor refusing medical attention.  Most of the history is obtained from the chart as the patient denies all this.  Family states that she is depressed due to not having a job.  She has been IVC by the ED.  PMH: hypertension ? ?  ?PT Comments  ? ? Pt agreeable to be seen by therapy though she expresses she is tired after OT session. Pt supervision for supine to sit EOB but min assist for to return to supine to bring BLE into bed, possibly secondary to fatigue. Pt min assist to stand from elevated surface using bariatric walker. During ambulation within the room using bariatric walker and min assist +2, pt exhibited behavior wherein she abandoned walker and rested her entire trunk and BUE on the end of the bed; when PT attempted to check her status and assist to bring her back to upright standing, pt expresses frustration and confusion (see ambulation section for full details), further mobility deferred. VSS on 4LO2 via [place], increased to 8L during ambulation task as pt exhibting signs of desatting, SpO2 dropped to 88%. We will continue to follow her acutely to promote safe discharge to the destination indicated below. ?   ?Recommendations for follow up therapy are one component of a multi-disciplinary discharge planning process, led by the attending physician.  Recommendations may be updated based on patient status, additional functional criteria and insurance authorization. ? ?Follow Up Recommendations ? Skilled nursing-short term rehab (<3 hours/day) (pt could be HHPT if she has 24/7 assistance) ?  ?  ?Assistance Recommended at Discharge Intermittent Supervision/Assistance   ?Patient can return home with the following A little help with walking and/or transfers;A little help with bathing/dressing/bathroom;Assist for transportation ?  ?Equipment Recommendations ? None recommended by PT  ?  ?Recommendations for Other Services   ? ? ?  ?Precautions / Restrictions Precautions ?Precautions: Fall ?Restrictions ?Weight Bearing Restrictions: No  ?  ? ?Mobility ? Bed Mobility ?Overal bed mobility: Needs Assistance ?Bed Mobility: Supine to Sit, Sit to Supine ?  ?  ?Supine to sit: Supervision ?Sit to supine: Min assist ?  ?General bed mobility comments: Pt required min assist to bring BLE into bed. ?  ? ?Transfers ?Overall transfer level: Needs assistance ?Equipment used: Conservation officer, nature (2 wheels) Angelia Mould) ?Transfers: Sit to/from Stand ?Sit to Stand: Min assist, +2 physical assistance, From elevated surface ?  ?  ?  ?  ?  ?General transfer comment: Pt needed min assist with power up from elevated surface.  Steadying assist once pt on her feet prior to moving. ?  ? ?Ambulation/Gait ?Ambulation/Gait assistance: Min assist, +2 safety/equipment ?Gait Distance (Feet): 5 Feet ?Assistive device: Rolling walker (2 wheels) ?Gait Pattern/deviations: Step-through pattern, Decreased stride length, Trunk flexed, Wide base of support, Drifts right/left, Leaning posteriorly ?Gait velocity: decreased ?  ?  ?General Gait Details: Pt ambulated with bariatric RW and min assist +2 for safety with overall steady gait but moves slowly and very deliberate with each step.  Needed help steering RW. once pt reached end of bed, pt abruptly abandoned RW and rested BUE on bed, assuming trunk in horizontal position causing PT to be alarmed.  When pt attempted to determine pt's status, pt expressed frustration "why are you pulling on me?" pt appears very unaware of her lack of safety awareness. Pt was able to use RW to return to sitting EOB without further incident.  Pt HR 100-130 bpm with activity. SpO2 93% at 4L at rest.   Desat as low as 89% but with pursed lip breathing able to incr to >90% on 4LO2. ? ? ?Stairs ?  ?  ?  ?  ?  ? ? ?Wheelchair Mobility ?  ? ?Modified Rankin (Stroke Patients Only) ?  ? ? ?  ?Balance Overall balance assessment: Needs assistance ?Sitting-balance support: No upper extremity supported, Feet supported ?Sitting balance-Leahy Scale: Fair ?  ?  ?Standing balance support: Bilateral upper extremity supported, During functional activity ?Standing balance-Leahy Scale: Poor ?Standing balance comment: Pt was able to stand with RW with bil UE support and external support. ?  ?  ?  ?  ?  ?  ?  ?  ?  ?  ?  ?  ? ?  ?Cognition Arousal/Alertness: Awake/alert ?Behavior During Therapy: [facility] for tasks assessed/performed, Flat affect, Impulsive ?Overall Cognitive Status: No family/caregiver present to determine baseline cognitive functioning ?  ?  ?  ?  ?  ?  ?  ?  ?  ?  ?  ?  ?  ?  ?  ?  ?General Comments: Pt demonstrating flat affect and some impulsivity, stopping and resting with both arms during ambulation task without notifying PT and then expressing frustration with attempts to check on pt and ensure she is safe. ?  ?  ? ?  ?Exercises   ? ?  ?General Comments   ?  ?  ? ?Pertinent Vitals/Pain Pain Assessment ?Pain Assessment: No/denies pain  ? ? ?Home Living Family/patient expects to be discharged to:: Private residence ?Living Arrangements: Alone ?Available Help at Discharge: Family;Available PRN/intermittently ?Type of Home: Other(Comment) (hotel) ?Home Access: Level entry ?  ?  ?  ?Home Layout: One level ?Home Equipment: Rollator (4 wheels);Cane - single point ?Additional Comments: Pt unreliable historian.  States she came to [facility] in April 2022. Chart states she was staying at hotel but does have sister and brother in area.  ?  ?Prior Function    ?  ?  ?   ? ?PT Goals (current goals can now be found in the care plan section) Acute Rehab PT Goals ?Patient Stated Goal: to go home ?PT Goal Formulation: With patient ?Time  For Goal Achievement: 07/01/21 ?Potential to Achieve Goals: Fair ? ?  ?Frequency ? ? ? Min 3X/week ? ? ? ?  ?PT Plan Current plan remains appropriate  ? ? ?Co-evaluation   ?  ?  ?  ?  ? ?  ?AM-PAC PT "6 Clicks" Mobility   ?Outcome Measure ? Help needed turning from your back to your side while in a flat bed without using bedrails?: None ?Help needed moving from lying on your back to sitting on the side of a flat bed without using bedrails?: None ?Help needed moving to and from a bed to a chair (including a wheelchair)?: Total ?Help needed standing up from a chair using your arms (e.g., wheelchair or bedside chair)?: Total ?Help needed to walk in hospital room?: Total ?Help needed climbing 3-5 steps with a railing? : Total ?6 Click Score: 12 ? ?  ?End of Session Equipment Utilized During Treatment: Gait belt;Oxygen (4L via ) ?Activity Tolerance: Patient limited by fatigue ?Patient left:  with call bell/phone within reach;in bed;with nursing/sitter in room ?Nurse Communication: Mobility status;Need for lift equipment ?PT Visit Diagnosis: Unsteadiness on feet (R26.81);Muscle weakness (generalized) (M62.81) ?  ? ? ?Time: [id number] ?PT Time Calculation (min) (ACUTE ONLY): 48 min ? ?Charges:  $Gait Training: 38-52 mins          ?          ? ?Coolidge Breeze, PT, DPT ?[facility] ?Office: [phone] ?Pager: [phone] ? ? ?Coolidge Breeze ?06/18/2021, 5:51 PM ? ?

## 2021-06-18 NOTE — Assessment & Plan Note (Addendum)
PT/OT recommends SNF unless she has 24/7 care/supervision. ?-TOC investigating disposition and home CPAP issue ?

## 2021-06-18 NOTE — TOC Progression Note (Addendum)
Transition of Care (TOC) - Progression Note  ? ? ?Patient Details  ?Name: Lucine Volpi ?MRN: [id number] ?Date of Birth: 1971/01/25 ? ?Transition of Care (TOC) CM/SW Contact  ?Lanier Clam, RN ?Phone Number: ?06/18/2021, 3:09 PM ? ?Clinical Narrative:  PT recc SNF;currently IVC;1:1 sitter;Patient agree to fax out-will await pasrr level 2.[employer] rep Danielle-f/u on CPAP, & 02.  ? ? ? ?Expected Discharge Plan: Skilled Nursing Facility ?Barriers to Discharge: Continued Medical Work up ? ?Expected Discharge Plan and Services ?Expected Discharge Plan: Skilled Nursing Facility ?In-house Referral: Clinical Social Work ?  ?Post Acute Care Choice: NA ?Living arrangements for the past 2 months: Hotel/Motel ?                ?DME Arranged: N/A ?DME Agency: NA ?  ?  ?  ?  ?  ?  ?  ?  ? ? ?Social Determinants of Health (SDOH) Interventions ?  ? ?Readmission Risk Interventions ?   ? View : No data to display.  ?  ?  ?  ? ? ?

## 2021-06-18 NOTE — Assessment & Plan Note (Addendum)
TTE with LVEF of 60 to 65% and G2 DD but no other symptoms finding.  She does not look fluid overloaded on exam but difficult exam due to body habitus.  BNP 200 but it could be falsely low in the setting of morbid obesity.  Respiratory failure likely from obstructive process such as OSA/OHS.  Not compliant with her home torsemide.  Good urine output.  Net negative several liters.  Renal function stable. ?-Continue torsemide 20 mg daily ?-Strict intake and output and daily weight ?-Monitor renal functions and electrolytes, and replace as appropriate ?-Sodium and fluid restriction. ?

## 2021-06-19 DIAGNOSIS — D509 Iron deficiency anemia, unspecified: Secondary | ICD-10-CM

## 2021-06-19 DIAGNOSIS — G9341 Metabolic encephalopathy: Secondary | ICD-10-CM | POA: Diagnosis not present

## 2021-06-19 DIAGNOSIS — J9621 Acute and chronic respiratory failure with hypoxia: Secondary | ICD-10-CM | POA: Diagnosis not present

## 2021-06-19 DIAGNOSIS — Z046 Encounter for general psychiatric examination, requested by authority: Secondary | ICD-10-CM | POA: Diagnosis not present

## 2021-06-19 DIAGNOSIS — I5032 Chronic diastolic (congestive) heart failure: Secondary | ICD-10-CM | POA: Diagnosis not present

## 2021-06-19 LAB — RENAL FUNCTION PANEL
Albumin: 3.1 g/dL — ABNORMAL LOW (ref 3.5–5.0)
Anion gap: 4 — ABNORMAL LOW (ref 5–15)
BUN: 7 mg/dL (ref 6–20)
CO2: 38 mmol/L — ABNORMAL HIGH (ref 22–32)
Calcium: 8 mg/dL — ABNORMAL LOW (ref 8.9–10.3)
Chloride: 98 mmol/L (ref 98–111)
Creatinine, Ser: 0.89 mg/dL (ref 0.44–1.00)
GFR, Estimated: >60 mL/min (ref >60)
Glucose, Bld: 117 mg/dL — ABNORMAL HIGH (ref 70–99)
Phosphorus: 3.6 mg/dL (ref 2.5–4.6)
Potassium: 4 mmol/L (ref 3.5–5.1)
Sodium: 140 mmol/L (ref 135–145)

## 2021-06-19 LAB — CK: Total CK: 34 U/L — ABNORMAL LOW (ref 38–234)

## 2021-06-19 LAB — CBC
HCT: 41.1 % (ref 36.0–46.0)
Hemoglobin: 10.7 g/dL — ABNORMAL LOW (ref 12.0–15.0)
MCH: 21.7 pg — ABNORMAL LOW (ref 26.0–34.0)
MCHC: 26 g/dL — ABNORMAL LOW (ref 30.0–36.0)
MCV: 83.5 fL (ref 80.0–100.0)
Platelets: 229 10*3/uL (ref 150–400)
RBC: 4.92 MIL/uL (ref 3.87–5.11)
RDW: 21.2 % — ABNORMAL HIGH (ref 11.5–15.5)
WBC: 6.4 10*3/uL (ref 4.0–10.5)
nRBC: 0 % (ref 0.0–0.2)

## 2021-06-19 LAB — MAGNESIUM: Magnesium: 1.8 mg/dL (ref 1.7–2.4)

## 2021-06-19 MED ORDER — SODIUM CHLORIDE 0.9 % IV SOLN
250.0000 mg | Freq: Once | INTRAVENOUS | Status: AC
  Administered 2021-06-19: 250 mg via INTRAVENOUS
  Filled 2021-06-19: qty 20

## 2021-06-19 MED ORDER — TORSEMIDE 20 MG PO TABS
40.0000 mg | ORAL_TABLET | Freq: Every day | ORAL | Status: DC
  Administered 2021-06-19 – 2021-06-20 (×2): 40 mg via ORAL
  Filled 2021-06-19 (×3): qty 2

## 2021-06-19 NOTE — TOC PASRR Note (Signed)
Transition of Care (TOC) -30 day Note   ? ?  ? ?Patient Details  ? ?Name: Felicia Warren ? ?QJF:[id number] ? ?Date of Birth:[birth year] ? ?  ? ?Transition of Care (TOC) CM/SW Contact  ? ?Name: Lanier Clam [facility] ? ?Phone Number:[phone] ? ?Date:06/19/2021 ? ?Time:3:38p ? ?  ? ?MUST [id number] ? ?  ? ?To Whom it May Concern: ? ?  ? ?Please be advised that the above patient will require a short-term nursing home stay, anticipated 30 days or less rehabilitation and strengthening. The plan is for return home.   ?

## 2021-06-19 NOTE — Progress Notes (Signed)
?PROGRESS NOTE ? ?Felicia Warren [id number] DOB: 12-23-1970  ? ?PCP: Elsie Stain, MD ? ?Patient is from: Extended stay ? ?DOA: 06/15/2021 LOS: 3 ? ?Chief complaints ?Chief Complaint  ?Patient presents with  ? IVC  ?  ? ?Brief Narrative / Interim history: ?51 year old F with PMH of OSA on CPAP, HTN, morbid obesity and PE not on AC brought to ED by family members under IVC due to self-neglect and inability to get off the floor and refusing medical attention, and admitted for acute hypoxic respiratory failure.  Basic labs including CBC and CMP without significant finding.  CT head without acute finding.  Troponin negative x2.  BNP 200.  CTA chest negative for PE but body wall edema and signs of chronic pulmonary hypertension.  Echocardiogram ordered.  Psychiatry consulted due to self-neglect.  ? ?Patient was transferred to stepdown unit due to encephalopathy and respiratory failure with hypoxia requiring BiPAP.  ABG suggestive for chronic respiratory acidosis with hypercapnia.  TTE with LVEF of 60 to 65%, G2-DD and normal RVSF.  Respiratory status and mental status improved, and she was transferred to progressive care. ? ?Per psychiatry, patient has no psychiatric condition but IVC ineffective due to lack of capacity in the setting of encephalopathy/delirium.  Psychiatry signed off. ? ?Therapy recommends SNF unless she has 24-hour care.  She received investigating about this and her home CPAP. ?  ? ?Subjective: ?Seen and examined earlier this morning.  No major events overnight of this morning.  No complaints.  She denies pain, shortness of breath, nausea or vomiting.  She is awake and oriented x4.  She thinks she is in the hospital because of a heart rate.  ? ?Objective: ?Vitals:  ? 06/19/21 0501 06/19/21 0800 06/19/21 0822 06/19/21 0913  ?BP: (!) 144/70     ?Pulse: 80     ?Resp: 20 (!) 31    ?Temp: 98.6 ?F (37 ?C)     ?TempSrc: Axillary     ?SpO2: 100% 100% 95% 95%  ?Weight: (!) 182.6 kg     ?Height:       ? ? ?Examination: ? ?GENERAL: No apparent distress.  Nontoxic. ?HEENT: MMM.  Vision and hearing grossly intact.  ?NECK: Supple.  Difficult to assess JVD due to body habitus. ?RESP:  No IWOB.  Fair aeration bilaterally but limited exam due to body habitus. ?CVS:  RRR. Heart sounds normal.  ?ABD/GI/GU: BS+. Abd soft, NTND.  ?MSK/EXT:  Moves extremities.  Difficult to assess edema due to body habitus ?SKIN: no apparent skin lesion or wound ?NEURO: Awake and alert. Oriented x4 but limited insight.  No apparent focal neuro deficit. ?PSYCH: Calm. Normal affect.  ? ?Procedures:  ?None ? ?Microbiology summarized: ?COVID-19 and influenza PCR nonreactive. ?Blood cultures NGTD. ? ?Assessment and Plan: ?* Acute on chronic respiratory failure with hypoxia and hypercapnia (HCC) ?Likely due to OSA/possible OHS.  She also carries history of right heart failure.  ABG 7.39/71/77/43 suggesting chronic respiratory acidosis.  CTA chest negative for PE or infectious process but concerning body wall edema.  BNP 200, could be falsely low.  Serial troponin negative.  Fluid status difficult to assess due to body habitus.  She has history of diastolic CHF but not compliant with torsemide.  TTE with normal LVEF and G2 DD. ?-Continue BiPAP as needed and at night.  It is very important that she use CPAP at night! ?-Minimum oxygen to keep saturation above 88% ?-Diastolic CHF as below ?-TOC to investigate about patient's home CPAP and  disposition. ? ?Chronic diastolic CHF (congestive heart failure) ([place]) ?TTE with LVEF of 60 to 65% and G2 DD but no other symptoms finding.  She does not look fluid overloaded on exam but difficult exam due to body habitus.  BNP 200 but it could be falsely low in the setting of morbid obesity.  Respiratory failure likely from obstructive process such as OSA/OHS.  Not compliant with her home torsemide. ?-Increase torsemide to 40 mg daily ?-Strict intake and output and daily weight ?-Monitor renal functions and  electrolytes ?-Sodium and fluid restriction. ? ?Involuntary commitment ?Patient was involuntary committed by family member/sister due to self-neglect and refusal to seek medical attention.  Evaluated by psychiatry and deemed to lack capacity but no psychiatric condition.  At some point, she threatened to leave AMA but not anymore.  Not a danger to self or someone else ?-IVC rescinded.  Safety sitter discontinued. ? ?Acute metabolic encephalopathy ?Due to hypercapnia?  CT head and basic labs, UDS, TSH, ammonia  and B12 without significant finding.  Does not seem to be on meds with risk for encephalopathy.  Low suspicion for infectious process.  She is awake and oriented x4 but with limited insight. ?-Minimum oxygen to keep saturation above 88% and BiPAP/CPAP at night. ?-Reorientation and delirium precaution ? ?Iron deficiency anemia ?Recent Labs  ?  07/14/20 ?[id number] 07/15/20 ?0404 07/16/20 ?0406 07/17/20 ?0357 07/22/20 ?0358 06/15/21 ?2328 06/16/21 ?1046 06/17/21 ?0241 06/18/21 ?0429 06/19/21 ?0451  ?HGB 10.3* 10.2* 10.3* 10.9* 11.2* 12.1 12.0 10.9* 10.6* 10.7*  ?H&H relatively stable and at baseline after initial drop likely from hemodilution.  Anemia panel suggested iron deficiency with ferritin of 12 and elevated TIBC. ?-IV ferric gluconate 250 mg x 1 ?-Continue monitoring ? ? ?Physical deconditioning ?PT/OT recommends SNF unless she has 24/7 care/supervision. ?-TOC investigating disposition and home CPAP issue ? ?Self neglect ?Seen involuntary commitment ? ?BMI 60.0-69.9, adult ([place]) ?Body mass index is 68.66 kg/m?. ?-Encourage lifestyle change to lose weight. ? 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 ? ?Prediabetes ?A1c 6.2%.  BMP glucose within acceptable range. ? ?Hypokalemia ?Resolved ? ?History of pulmonary embolism ?Not on anticoagulation at home.  Refusing subcu Lovenox for VTE prophylaxis. ?-Changed subcu Lovenox to p.o. Xarelto ? ? ?DVT prophylaxis:  ?rivaroxaban (XARELTO) tablet 10 mg Start: 06/19/21  1000 ?rivaroxaban (XARELTO) tablet 10 mg  ?Code Status: Full code ?Family Communication: None at bedside. ?Level of care: Progressive ?Status is: Inpatient ?Remains inpatient appropriate because: Acute metabolic encephalopathy, respiratory failure and safe disposition ? ? ?Final disposition: SNF ?Consultants:  ?Psychiatry-signed off ? ?Sch Meds:  ?Scheduled Meds: ? Chlorhexidine Gluconate Cloth  6 each Topical Daily  ? pantoprazole  40 mg Oral QHS  ? rivaroxaban  10 mg Oral Daily  ? sodium chloride flush  3 mL Intravenous Q12H  ? torsemide  40 mg Oral Daily  ? ?Continuous Infusions: ? sodium chloride    ? ?PRN Meds:.sodium chloride, acetaminophen **OR** acetaminophen, ondansetron **OR** ondansetron (ZOFRAN) IV, sodium chloride flush ? ?Antimicrobials: ?Anti-infectives (From admission, onward)  ? ? Start     Dose/Rate Route Frequency Ordered Stop  ? 06/16/21 0115  cefTRIAXone (ROCEPHIN) 1 g in sodium chloride 0.9 % 100 mL IVPB  Status:  Discontinued       ? 1 g ?200 mL/hr over 30 Minutes Intravenous  Once 06/16/21 0101 06/16/21 0102  ? 06/16/21 0115  azithromycin (ZITHROMAX) 500 mg in sodium chloride 0.9 % 250 mL IVPB       ? 500 mg ?250  mL/hr over 60 Minutes Intravenous  Once 06/16/21 0101 06/16/21 0501  ? 06/16/21 0115  cefTRIAXone (ROCEPHIN) 2 g in sodium chloride 0.9 % 100 mL IVPB       ? 2 g ?200 mL/hr over 30 Minutes Intravenous  Once 06/16/21 0102 06/16/21 0415  ? ?  ? ? ? ?I have personally reviewed the following labs and images: ?CBC: ?Recent Labs  ?Lab 06/15/21 ?2328 06/16/21 ?1046 06/17/21 ?0241 06/18/21 ?0429 06/19/21 ?0451  ?WBC 6.8 6.4 6.0 6.1 6.4  ?NEUTROABS 5.3  --   --   --   --   ?HGB 12.1 12.0 10.9* 10.6* 10.7*  ?HCT 44.7 44.0 42.4 40.2 41.1  ?MCV 80.7 81.5 83.8 82.5 83.5  ?PLT 256 271 246 246 229  ? ?BMP &GFR ?Recent Labs  ?Lab 06/15/21 ?2328 06/16/21 ?1046 06/17/21 ?0241 06/18/21 ?0429 06/19/21 ?0451  ?NA 139  --  142 142 140  ?K 3.1*  --  3.6 3.3* 4.0  ?CL 97*  --  99 98 98  ?CO2 35*  --  35*  39* 38*  ?GLUCOSE 97  --  83 100* 117*  ?BUN 7  --  <5* 7 7  ?CREATININE 0.67 0.75 0.71 0.84 0.89  ?CALCIUM 8.0*  --  7.7* 8.0* 8.0*  ?MG  --   --   --  1.8 1.8  ?PHOS  --   --   --  3.1 3.6  ? ?Estimated Creatinine

## 2021-06-19 NOTE — TOC Progression Note (Signed)
Transition of Care (TOC) - Progression Note  ? ? ?Patient Details  ?Name: Felicia Warren ?MRN: [id number] ?Date of Birth: February 09, 1971 ? ?Transition of Care (TOC) CM/SW Contact  ?Lanier Clam, RN ?Phone Number: ?06/19/2021, 4:59 PM ? ?Clinical Narrative: No bed offers;awaiting pasrr.   ? ? ? ?Expected Discharge Plan: Skilled Nursing Facility ?Barriers to Discharge: Continued Medical Work up ? ?Expected Discharge Plan and Services ?Expected Discharge Plan: Skilled Nursing Facility ?In-house Referral: Clinical Social Work ?  ?Post Acute Care Choice: NA ?Living arrangements for the past 2 months: Hotel/Motel ?                ?DME Arranged: N/A ?DME Agency: NA ?  ?  ?  ?  ?  ?  ?  ?  ? ? ?Social Determinants of Health (SDOH) Interventions ?  ? ?Readmission Risk Interventions ?   ? View : No data to display.  ?  ?  ?  ? ? ?

## 2021-06-19 NOTE — TOC Progression Note (Addendum)
Transition of Care (TOC) - Progression Note  ? ? ?Patient Details  ?Name: Felicia Warren ?MRN: [id number] ?Date of Birth: 1970-04-02 ? ?Transition of Care (TOC) CM/SW Contact  ?Dessa Phi, RN ?Phone Number: ?06/19/2021, 12:11 PM ? ?Clinical Narrative:  IVC rescended.  ?-3:41p-awaiting completion of info for pasrr. ? ? ? ?Expected Discharge Plan: [place] ?Barriers to Discharge: Continued Medical Work up ? ?Expected Discharge Plan and Services ?Expected Discharge Plan: [place] ?In-house Referral: Clinical Social Work ?  ?Post Acute Care Choice: NA ?Living arrangements for the past 2 months: Hotel/Motel ?                ?DME Arranged: N/A ?DME Agency: NA ?  ?  ?  ?  ?  ?  ?  ?  ? ? ?Social Determinants of Health (SDOH) Interventions ?  ? ?Readmission Risk Interventions ?   ? View : No data to display.  ?  ?  ?  ? ? ?

## 2021-06-20 DIAGNOSIS — D509 Iron deficiency anemia, unspecified: Secondary | ICD-10-CM

## 2021-06-20 DIAGNOSIS — G9341 Metabolic encephalopathy: Secondary | ICD-10-CM | POA: Diagnosis not present

## 2021-06-20 DIAGNOSIS — I5032 Chronic diastolic (congestive) heart failure: Secondary | ICD-10-CM | POA: Diagnosis not present

## 2021-06-20 DIAGNOSIS — J9621 Acute and chronic respiratory failure with hypoxia: Secondary | ICD-10-CM | POA: Diagnosis not present

## 2021-06-20 DIAGNOSIS — Z046 Encounter for general psychiatric examination, requested by authority: Secondary | ICD-10-CM | POA: Diagnosis not present

## 2021-06-20 DIAGNOSIS — E878 Other disorders of electrolyte and fluid balance, not elsewhere classified: Secondary | ICD-10-CM

## 2021-06-20 LAB — RENAL FUNCTION PANEL
Albumin: 2.9 g/dL — ABNORMAL LOW (ref 3.5–5.0)
Anion gap: 6 (ref 5–15)
BUN: 8 mg/dL (ref 6–20)
CO2: 41 mmol/L — ABNORMAL HIGH (ref 22–32)
Calcium: 7.9 mg/dL — ABNORMAL LOW (ref 8.9–10.3)
Chloride: 94 mmol/L — ABNORMAL LOW (ref 98–111)
Creatinine, Ser: 0.8 mg/dL (ref 0.44–1.00)
GFR, Estimated: >60 mL/min (ref >60)
Glucose, Bld: 94 mg/dL (ref 70–99)
Phosphorus: 3.7 mg/dL (ref 2.5–4.6)
Potassium: 3.4 mmol/L — ABNORMAL LOW (ref 3.5–5.1)
Sodium: 141 mmol/L (ref 135–145)

## 2021-06-20 LAB — CBC
HCT: 39.8 % (ref 36.0–46.0)
Hemoglobin: 10.6 g/dL — ABNORMAL LOW (ref 12.0–15.0)
MCH: 21.9 pg — ABNORMAL LOW (ref 26.0–34.0)
MCHC: 26.6 g/dL — ABNORMAL LOW (ref 30.0–36.0)
MCV: 82.4 fL (ref 80.0–100.0)
Platelets: 214 10*3/uL (ref 150–400)
RBC: 4.83 MIL/uL (ref 3.87–5.11)
RDW: 21.2 % — ABNORMAL HIGH (ref 11.5–15.5)
WBC: 5.1 10*3/uL (ref 4.0–10.5)
nRBC: 0 % (ref 0.0–0.2)

## 2021-06-20 LAB — MAGNESIUM: Magnesium: 1.4 mg/dL — ABNORMAL LOW (ref 1.7–2.4)

## 2021-06-20 MED ORDER — POTASSIUM CHLORIDE CRYS ER 20 MEQ PO TBCR
40.0000 meq | EXTENDED_RELEASE_TABLET | ORAL | Status: AC
  Administered 2021-06-20 (×2): 40 meq via ORAL
  Filled 2021-06-20 (×2): qty 2

## 2021-06-20 MED ORDER — MAGNESIUM SULFATE 4 GM/100ML IV SOLN
4.0000 g | Freq: Once | INTRAVENOUS | Status: AC
  Administered 2021-06-20: 4 g via INTRAVENOUS
  Filled 2021-06-20: qty 100

## 2021-06-20 NOTE — Progress Notes (Signed)
?PROGRESS NOTE ? ?Felicia Warren [id number] DOB: 1971/02/11  ? ?PCP: Elsie Stain, MD ? ?Patient is from: Motel/hotel ? ?DOA: 06/15/2021 LOS: 4 ? ?Chief complaints ?Chief Complaint  ?Patient presents with  ? IVC  ?  ? ?Brief Narrative / Interim history: ?51 year old F with PMH of OSA on CPAP, HTN, morbid obesity and PE not on AC brought to ED by family members under IVC due to self-neglect and inability to get off the floor and refusing medical attention, and admitted for acute hypoxic respiratory failure.  Basic labs including CBC and CMP without significant finding.  CT head without acute finding.  Troponin negative x2.  BNP 200.  CTA chest negative for PE but body wall edema and signs of chronic pulmonary hypertension.  Echocardiogram ordered.  Psychiatry consulted due to self-neglect.  ? ?Patient was transferred to stepdown unit due to encephalopathy and respiratory failure with hypoxia requiring BiPAP.  ABG suggestive for chronic respiratory acidosis with hypercapnia.  TTE with LVEF of 60 to 65%, G2-DD and normal RVSF.  Respiratory status and mental status improved, and she was transferred to progressive care. ? ?Per psychiatry, patient has no psychiatric condition but IVC ineffective due to lack of capacity in the setting of encephalopathy/delirium.  Psychiatry signed off. ? ?Therapy recommends SNF unless she has 24-hour care.  She received investigating about this and her home CPAP. ?  ? ?Subjective: ?Seen and examined earlier this morning.  No major events overnight of this morning.  No complaints.  She denies chest pain, dyspnea, GI or UTI symptoms other than urinating a lot from diuretics.  She is awake, alert and oriented x4 but limited insight. ? ?Objective: ?Vitals:  ? 06/19/21 2308 06/20/21 0500 06/20/21 0833 06/20/21 1229  ?BP:  120/64  (!) 114/58  ?Pulse:  72  87  ?Resp: (!) 0 18  18  ?Temp:  98.1 ?F (36.7 ?C)  98.3 ?F (36.8 ?C)  ?TempSrc:  Oral  Oral  ?SpO2:  98% 92% 96%  ?Weight:      ?Height:       ? ? ?Examination: ? ?GENERAL: No apparent distress.  Nontoxic. ?HEENT: MMM.  Vision and hearing grossly intact.  ?NECK: Supple.  Difficult to assess JVD due to body habitus. ?RESP:  No IWOB.  Fair aeration bilaterally. ?CVS:  RRR. Heart sounds normal.  ?ABD/GI/GU: BS+. Abd soft, NTND.  ?MSK/EXT:  Moves extremities.  Difficult to assess edema due to body habitus. ?SKIN: no apparent skin lesion or wound ?NEURO: Awake and alert. Oriented x4 but limited insight.  No apparent focal neuro deficit. ?PSYCH: Calm. Normal affect.  ? ?Procedures:  ?None ? ?Microbiology summarized: ?COVID-19 and influenza PCR nonreactive. ?Blood cultures NGTD. ? ?Assessment and Plan: ?* Acute on chronic respiratory failure with hypoxia and hypercapnia (HCC) ?Likely due to OSA/possible OHS.  She also carries history of right heart failure.  ABG 7.39/71/77/43 suggesting chronic respiratory acidosis.  CTA chest negative for PE or infectious process but concerning body wall edema.  BNP 200, could be falsely low.  Serial troponin negative.  Fluid status difficult to assess due to body habitus.  She has history of diastolic CHF but not compliant with torsemide.  TTE with normal LVEF and G2 DD. ?-Continue BiPAP as needed and at night.  It is very important that she use CPAP at night! ?-Minimum oxygen to keep saturation above 88% ?-Continue diuretics for CHF ?-TOC to investigate about patient's home CPAP and disposition. ? ?Chronic diastolic CHF (congestive heart failure) ([place]) ?TTE  with LVEF of 60 to 65% and G2 DD but no other symptoms finding.  She does not look fluid overloaded on exam but difficult exam due to body habitus.  BNP 200 but it could be falsely low in the setting of morbid obesity.  Respiratory failure likely from obstructive process such as OSA/OHS.  Not compliant with her home torsemide.  About 3.4 L UOP/24 hours.  Renal function stable. ?-Continue torsemide to 40 mg daily ?-Strict intake and output and daily weight ?-Monitor  renal functions and electrolytes, and replace as appropriate ?-Sodium and fluid restriction. ? ?Involuntary commitment ?Patient was involuntary committed by family member/sister due to self-neglect and refusal to seek medical attention.  Evaluated by psychiatry and deemed to lack capacity but no psychiatric condition.  At some point, she threatened to leave AMA but not anymore.  Not a danger to self or someone else ?-IVC rescinded.  Safety sitter discontinued. ? ?Acute metabolic encephalopathy ?Due to hypercapnia?  CT head and basic labs, UDS, TSH, ammonia  and B12 without significant finding.  Does not seem to be on meds with risk for encephalopathy.  Low suspicion for infectious process.  She is awake and oriented x4 but with limited insight. ?-Minimum oxygen to keep saturation above 88% and BiPAP/CPAP at night. ?-Reorientation and delirium precaution ? ?Hypokalemia and hypomagnesemia ?K3.4.  Mg 1.4. ?-P.o. KCl 40x2 ?-IV magnesium 4 g x 1 ? ?Iron deficiency anemia ?Recent Labs  ?  07/15/20 ?0404 07/16/20 ?0406 07/17/20 ?[id number] 07/22/20 ?0358 06/15/21 ?2328 06/16/21 ?1046 06/17/21 ?0241 06/18/21 ?0429 06/19/21 ?0451 06/20/21 ?0434  ?HGB 10.2* 10.3* 10.9* 11.2* 12.1 12.0 10.9* 10.6* 10.7* 10.6*  ?H&H relatively stable and at baseline after initial drop likely from hemodilution.  Anemia panel suggested iron deficiency with ferritin of 12 and elevated TIBC. ?-IV ferric gluconate 250 mg on 4/28 ?-Continue monitoring ? ? ?Physical deconditioning ?PT/OT recommends SNF unless she has 24/7 care/supervision. ?-TOC investigating disposition and home CPAP issue ? ?Self neglect ?Seen involuntary commitment ? ?BMI 60.0-69.9, adult ([place]) ?Body mass index is 68.66 kg/m?. ?-Encourage lifestyle change to lose weight. ? 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 ? ?Prediabetes ?A1c 6.2%.  BMP glucose within acceptable range. ? ?History of pulmonary embolism ?Not on anticoagulation at home.  Refusing subcu Lovenox for VTE  prophylaxis. ?-Changed subcu Lovenox to p.o. Xarelto ? ? ?DVT prophylaxis:  ?rivaroxaban (XARELTO) tablet 10 mg Start: 06/19/21 1000 ?rivaroxaban (XARELTO) tablet 10 mg  ?Code Status: Full code ?Family Communication: None at bedside. ?Level of care: Progressive ?Status is: Inpatient ?Remains inpatient appropriate because: Electrolyte and safe disposition ? ? ?Final disposition: SNF ?Consultants:  ?Psychiatry-signed off ? ?Sch Meds:  ?Scheduled Meds: ? Chlorhexidine Gluconate Cloth  6 each Topical Daily  ? pantoprazole  40 mg Oral QHS  ? rivaroxaban  10 mg Oral Daily  ? sodium chloride flush  3 mL Intravenous Q12H  ? torsemide  40 mg Oral Daily  ? ?Continuous Infusions: ? sodium chloride    ? ?PRN Meds:.sodium chloride, acetaminophen **OR** acetaminophen, ondansetron **OR** ondansetron (ZOFRAN) IV, sodium chloride flush ? ?Antimicrobials: ?Anti-infectives (From admission, onward)  ? ? Start     Dose/Rate Route Frequency Ordered Stop  ? 06/16/21 0115  cefTRIAXone (ROCEPHIN) 1 g in sodium chloride 0.9 % 100 mL IVPB  Status:  Discontinued       ? 1 g ?200 mL/hr over 30 Minutes Intravenous  Once 06/16/21 0101 06/16/21 0102  ? 06/16/21 0115  azithromycin (ZITHROMAX) 500 mg in sodium chloride 0.9 %  250 mL IVPB       ? 500 mg ?250 mL/hr over 60 Minutes Intravenous  Once 06/16/21 0101 06/16/21 0501  ? 06/16/21 0115  cefTRIAXone (ROCEPHIN) 2 g in sodium chloride 0.9 % 100 mL IVPB       ? 2 g ?200 mL/hr over 30 Minutes Intravenous  Once 06/16/21 0102 06/16/21 0415  ? ?  ? ? ? ?I have personally reviewed the following labs and images: ?CBC: ?Recent Labs  ?Lab 06/15/21 ?2328 06/16/21 ?1046 06/17/21 ?0241 06/18/21 ?0429 06/19/21 ?0451 06/20/21 ?0434  ?WBC 6.8 6.4 6.0 6.1 6.4 5.1  ?NEUTROABS 5.3  --   --   --   --   --   ?HGB 12.1 12.0 10.9* 10.6* 10.7* 10.6*  ?HCT 44.7 44.0 42.4 40.2 41.1 39.8  ?MCV 80.7 81.5 83.8 82.5 83.5 82.4  ?PLT 256 271 246 246 229 214  ? ?BMP &GFR ?Recent Labs  ?Lab 06/15/21 ?2328 06/16/21 ?1046  06/17/21 ?0241 06/18/21 ?0429 06/19/21 ?0451 06/20/21 ?0434  ?NA 139  --  142 142 140 141  ?K 3.1*  --  3.6 3.3* 4.0 3.4*  ?CL 97*  --  99 98 98 94*  ?CO2 35*  --  35* 39* 38* 41*  ?GLUCOSE 97  --  83 100* 117* 94  ?BUN 7  --

## 2021-06-20 NOTE — Assessment & Plan Note (Addendum)
Resolved

## 2021-06-21 DIAGNOSIS — G9341 Metabolic encephalopathy: Secondary | ICD-10-CM | POA: Diagnosis not present

## 2021-06-21 DIAGNOSIS — Z046 Encounter for general psychiatric examination, requested by authority: Secondary | ICD-10-CM | POA: Diagnosis not present

## 2021-06-21 DIAGNOSIS — I5032 Chronic diastolic (congestive) heart failure: Secondary | ICD-10-CM | POA: Diagnosis not present

## 2021-06-21 DIAGNOSIS — J9621 Acute and chronic respiratory failure with hypoxia: Secondary | ICD-10-CM | POA: Diagnosis not present

## 2021-06-21 LAB — RENAL FUNCTION PANEL
Albumin: 2.9 g/dL — ABNORMAL LOW (ref 3.5–5.0)
Anion gap: 7 (ref 5–15)
BUN: 10 mg/dL (ref 6–20)
CO2: 43 mmol/L — ABNORMAL HIGH (ref 22–32)
Calcium: 8.3 mg/dL — ABNORMAL LOW (ref 8.9–10.3)
Chloride: 94 mmol/L — ABNORMAL LOW (ref 98–111)
Creatinine, Ser: 0.94 mg/dL (ref 0.44–1.00)
GFR, Estimated: >60 mL/min
Glucose, Bld: 93 mg/dL (ref 70–99)
Phosphorus: 3.8 mg/dL (ref 2.5–4.6)
Potassium: 3.8 mmol/L (ref 3.5–5.1)
Sodium: 144 mmol/L (ref 135–145)

## 2021-06-21 LAB — CULTURE, BLOOD (ROUTINE X 2)
Culture: NO GROWTH
Culture: NO GROWTH
Special Requests: ADEQUATE

## 2021-06-21 LAB — MAGNESIUM: Magnesium: 1.7 mg/dL (ref 1.7–2.4)

## 2021-06-21 MED ORDER — POTASSIUM CHLORIDE CRYS ER 20 MEQ PO TBCR
40.0000 meq | EXTENDED_RELEASE_TABLET | Freq: Once | ORAL | Status: AC
  Administered 2021-06-21: 40 meq via ORAL
  Filled 2021-06-21: qty 2

## 2021-06-21 MED ORDER — MAGNESIUM SULFATE 2 GM/50ML IV SOLN
2.0000 g | Freq: Once | INTRAVENOUS | Status: AC
  Administered 2021-06-21: 2 g via INTRAVENOUS
  Filled 2021-06-21: qty 50

## 2021-06-21 MED ORDER — TORSEMIDE 20 MG PO TABS
20.0000 mg | ORAL_TABLET | Freq: Every day | ORAL | Status: DC
  Administered 2021-06-21 – 2021-06-26 (×6): 20 mg via ORAL
  Filled 2021-06-21 (×6): qty 1

## 2021-06-21 NOTE — Progress Notes (Signed)
?PROGRESS NOTE ? ?Felicia Warren [id number] DOB: [birth year]  ? ?PCP: Elsie Stain, MD ? ?Patient is from: Motel/hotel ? ?DOA: 06/15/2021 LOS: 5 ? ?Chief complaints ?Chief Complaint  ?Patient presents with  ? IVC  ?  ? ?Brief Narrative / Interim history: ?51 year old F with PMH of OSA on CPAP, HTN, morbid obesity and PE not on AC brought to ED by family members under IVC due to self-neglect and inability to get off the floor and refusing medical attention, and admitted for acute hypoxic respiratory failure.  Basic labs including CBC and CMP without significant finding.  CT head without acute finding.  Troponin negative x2.  BNP 200.  CTA chest negative for PE but body wall edema and signs of chronic pulmonary hypertension.  Echocardiogram ordered.  Psychiatry consulted due to self-neglect.  ? ?Patient was transferred to stepdown unit due to encephalopathy and respiratory failure with hypoxia requiring BiPAP.  ABG suggestive for chronic respiratory acidosis with hypercapnia.  TTE with LVEF of 60 to 65%, G2-DD and normal RVSF.  Respiratory status and mental status improved, and she was transferred to progressive care. ? ?Per psychiatry, patient has no psychiatric condition but IVC ineffective due to lack of capacity in the setting of encephalopathy/delirium.  Psychiatry signed off. ? ?Therapy recommends SNF unless she has 24-hour care.  She received investigating about this and her home CPAP. ?  ? ?Subjective: ?Seen and examined earlier this morning.  No major events overnight of this morning.  No complaints.  She denies shortness of breath, chest pain, GI or UTI symptoms. ? ?Objective: ?Vitals:  ? 06/21/21 0730 06/21/21 0848 06/21/21 1059 06/21/21 1208  ?BP:   (!) 123/55   ?Pulse:   89   ?Resp: 19  (!) 25   ?Temp:   98.1 ?F (36.7 ?C)   ?TempSrc:   Oral   ?SpO2: 94% 97% 94% 94%  ?Weight:      ?Height:      ? ? ?Examination: ? ?GENERAL: No apparent distress.  Nontoxic. ?HEENT: MMM.  Vision and hearing grossly  intact.  ?NECK: Supple.  Difficult to assess JVD due to body habitus. ?RESP:  No IWOB.  Fair aeration bilaterally. ?CVS:  RRR. Heart sounds normal.  ?ABD/GI/GU: BS+. Abd soft, NTND.  ?MSK/EXT:  Moves extremities.  Difficult to assess edema due to body habitus. ?SKIN: no apparent skin lesion or wound ?NEURO: Awake and alert. Oriented appropriately but limited insight.  No apparent focal neuro deficit. ?PSYCH: Calm. Normal affect.  ? ?Procedures:  ?None ? ?Microbiology summarized: ?COVID-19 and influenza PCR nonreactive. ?Blood cultures NGTD. ? ?Assessment and Plan: ?* Acute on chronic respiratory failure with hypoxia and hypercapnia (HCC) ?Likely due to OSA/possible OHS.  She also carries history of right heart failure.  ABG 7.39/71/77/43 suggesting chronic respiratory acidosis.  CTA chest negative for PE or infectious process but concerning body wall edema.  BNP 200, could be falsely low.  Serial troponin negative.  Fluid status difficult to assess due to body habitus.  She has history of diastolic CHF but not compliant with torsemide.  TTE with normal LVEF and G2 DD. ?-Continue BiPAP as needed and at night.  It is very important that she use CPAP at night! ?-Minimum oxygen to keep saturation above 88% ?-Continue diuretics for CHF ?-TOC to investigate about patient's home CPAP and disposition. ? ?Chronic diastolic CHF (congestive heart failure) ([place]) ?TTE with LVEF of 60 to 65% and G2 DD but no other symptoms finding.  She does not look  fluid overloaded on exam but difficult exam due to body habitus.  BNP 200 but it could be falsely low in the setting of morbid obesity.  Respiratory failure likely from obstructive process such as OSA/OHS.  Not compliant with her home torsemide.  About 2.6 L UOP/24 hours.  Net -5.6 L.  Creatinine slightly up ?-Decrease torsemide to 20 mg daily. ?-Strict intake and output and daily weight ?-Monitor renal functions and electrolytes, and replace as appropriate ?-Sodium and fluid  restriction. ? ?Involuntary commitment ?Patient was involuntary committed by family member/sister due to self-neglect and refusal to seek medical attention.  Evaluated by psychiatry and deemed to lack capacity but no psychiatric condition.  At some point, she threatened to leave AMA but not anymore.  Not a danger to self or someone else ?-IVC rescinded.  Safety sitter discontinued. ? ?Acute metabolic encephalopathy ?Due to hypercapnia?  CT head and basic labs, UDS, TSH, ammonia  and B12 without significant finding.  Does not seem to be on meds with risk for encephalopathy.  Low suspicion for infectious process.  She is awake and oriented x4 but with limited insight. ?-Minimum oxygen to keep saturation above 88% and BiPAP/CPAP at night. ?-Reorientation and delirium precaution ? ?Hypokalemia and hypomagnesemia ?Improved.  K3.8.  Mg 1.7. ?-P.o. KCl 40x1 ?-IV magnesium 2 g x 1 ? ?Iron deficiency anemia ?Recent Labs  ?  07/15/20 ?0404 07/16/20 ?0406 07/17/20 ?[id number] 07/22/20 ?0358 06/15/21 ?2328 06/16/21 ?1046 06/17/21 ?0241 06/18/21 ?0429 06/19/21 ?0451 06/20/21 ?0434  ?HGB 10.2* 10.3* 10.9* 11.2* 12.1 12.0 10.9* 10.6* 10.7* 10.6*  ?H&H relatively stable and at baseline after initial drop likely from hemodilution.  Anemia panel suggested iron deficiency with ferritin of 12 and elevated TIBC. ?-IV ferric gluconate 250 mg on 4/28 ?-Continue monitoring ? ? ?Physical deconditioning ?PT/OT recommends SNF unless she has 24/7 care/supervision. ?-TOC investigating disposition and home CPAP issue ? ?Self neglect ?Seen involuntary commitment ? ?BMI 60.0-69.9, adult ([place]) ?Body mass index is 68.66 kg/m?. ?-Encourage lifestyle change to lose weight. ? 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 ? ?Prediabetes ?A1c 6.2%.  BMP glucose within acceptable range. ? ?History of pulmonary embolism ?Not on anticoagulation at home.  Refusing subcu Lovenox for VTE prophylaxis. ?-Continue Xarelto 10 mg daily.   ? ? ?DVT prophylaxis:  ?rivaroxaban  (XARELTO) tablet 10 mg Start: 06/19/21 1000 ?rivaroxaban (XARELTO) tablet 10 mg  ?Code Status: Full code ?Family Communication: None at bedside. ?Level of care: Progressive due to need for intermittent BiPAP ?Status is: Inpatient ?Remains inpatient appropriate because: Safe disposition/SNF ? ? ?Final disposition: SNF ?Consultants:  ?Psychiatry-signed off ? ?Sch Meds:  ?Scheduled Meds: ? Chlorhexidine Gluconate Cloth  6 each Topical Daily  ? pantoprazole  40 mg Oral QHS  ? rivaroxaban  10 mg Oral Daily  ? sodium chloride flush  3 mL Intravenous Q12H  ? torsemide  20 mg Oral Daily  ? ?Continuous Infusions: ? sodium chloride    ? ?PRN Meds:.sodium chloride, acetaminophen **OR** acetaminophen, ondansetron **OR** ondansetron (ZOFRAN) IV, sodium chloride flush ? ?Antimicrobials: ?Anti-infectives (From admission, onward)  ? ? Start     Dose/Rate Route Frequency Ordered Stop  ? 06/16/21 0115  cefTRIAXone (ROCEPHIN) 1 g in sodium chloride 0.9 % 100 mL IVPB  Status:  Discontinued       ? 1 g ?200 mL/hr over 30 Minutes Intravenous  Once 06/16/21 0101 06/16/21 0102  ? 06/16/21 0115  azithromycin (ZITHROMAX) 500 mg in sodium chloride 0.9 % 250 mL IVPB       ?  500 mg ?250 mL/hr over 60 Minutes Intravenous  Once 06/16/21 0101 06/16/21 0501  ? 06/16/21 0115  cefTRIAXone (ROCEPHIN) 2 g in sodium chloride 0.9 % 100 mL IVPB       ? 2 g ?200 mL/hr over 30 Minutes Intravenous  Once 06/16/21 0102 06/16/21 0415  ? ?  ? ? ? ?I have personally reviewed the following labs and images: ?CBC: ?Recent Labs  ?Lab 06/15/21 ?2328 06/16/21 ?1046 06/17/21 ?0241 06/18/21 ?0429 06/19/21 ?0451 06/20/21 ?0434  ?WBC 6.8 6.4 6.0 6.1 6.4 5.1  ?NEUTROABS 5.3  --   --   --   --   --   ?HGB 12.1 12.0 10.9* 10.6* 10.7* 10.6*  ?HCT 44.7 44.0 42.4 40.2 41.1 39.8  ?MCV 80.7 81.5 83.8 82.5 83.5 82.4  ?PLT 256 271 246 246 229 214  ? ?BMP &GFR ?Recent Labs  ?Lab 06/17/21 ?0241 06/18/21 ?0429 06/19/21 ?0451 06/20/21 ?[id number] 06/21/21 ?[id number]  ?NA 142 142 140 141 144  ?K  3.6 3.3* 4.0 3.4* 3.8  ?CL 99 98 98 94* 94*  ?CO2 35* 39* 38* 41* 43*  ?GLUCOSE 83 100* 117* 94 93  ?BUN <5* 7 7 8 10   ?CREATININE 0.71 0.84 0.89 0.80 0.94  ?CALCIUM 7.7* 8.0* 8.0* 7.9* 8.3*  ?MG  --  1.8 1.8 1.4*

## 2021-06-22 DIAGNOSIS — Z723 Lack of physical exercise: Secondary | ICD-10-CM

## 2021-06-22 DIAGNOSIS — T7401XA Adult neglect or abandonment, confirmed, initial encounter: Secondary | ICD-10-CM

## 2021-06-22 DIAGNOSIS — G9341 Metabolic encephalopathy: Secondary | ICD-10-CM | POA: Diagnosis not present

## 2021-06-22 DIAGNOSIS — Z046 Encounter for general psychiatric examination, requested by authority: Secondary | ICD-10-CM | POA: Diagnosis not present

## 2021-06-22 DIAGNOSIS — J9621 Acute and chronic respiratory failure with hypoxia: Secondary | ICD-10-CM | POA: Diagnosis not present

## 2021-06-22 DIAGNOSIS — R259 Unspecified abnormal involuntary movements: Secondary | ICD-10-CM

## 2021-06-22 DIAGNOSIS — I5032 Chronic diastolic (congestive) heart failure: Secondary | ICD-10-CM | POA: Diagnosis not present

## 2021-06-22 LAB — RENAL FUNCTION PANEL
Albumin: 2.9 g/dL — ABNORMAL LOW (ref 3.5–5.0)
Anion gap: 4 — ABNORMAL LOW (ref 5–15)
BUN: 9 mg/dL (ref 6–20)
CO2: 41 mmol/L — ABNORMAL HIGH (ref 22–32)
Calcium: 8.1 mg/dL — ABNORMAL LOW (ref 8.9–10.3)
Chloride: 96 mmol/L — ABNORMAL LOW (ref 98–111)
Creatinine, Ser: 0.91 mg/dL (ref 0.44–1.00)
GFR, Estimated: >60 mL/min (ref >60)
Glucose, Bld: 102 mg/dL — ABNORMAL HIGH (ref 70–99)
Phosphorus: 3.8 mg/dL (ref 2.5–4.6)
Potassium: 3.8 mmol/L (ref 3.5–5.1)
Sodium: 141 mmol/L (ref 135–145)

## 2021-06-22 LAB — MAGNESIUM: Magnesium: 1.9 mg/dL (ref 1.7–2.4)

## 2021-06-22 MED ORDER — POTASSIUM CHLORIDE CRYS ER 20 MEQ PO TBCR
20.0000 meq | EXTENDED_RELEASE_TABLET | Freq: Two times a day (BID) | ORAL | Status: AC
Start: 2021-06-22 — End: 2021-06-23
  Administered 2021-06-22 – 2021-06-23 (×2): 20 meq via ORAL
  Filled 2021-06-22 (×2): qty 1

## 2021-06-22 NOTE — TOC Progression Note (Signed)
Transition of Care (TOC) - Progression Note  ? ? ?Patient Details  ?Name: Felicia Warren ?MRN: [id number] ?Date of Birth: Nov 05, 1970 ? ?Transition of Care (TOC) CM/SW Contact  ?Lanier Clam, RN ?Phone Number: ?06/22/2021, 4:04 PM ? ?Clinical Narrative: Spoke to patient about d/c -she states she will d/c home to [address];has rw, does not want 24hr caregivers. On 02-monitor if needed @ home can arrange.[facility] for transport @ d/c. ? ? ? ?Expected Discharge Plan: Home/Self Care ?Barriers to Discharge: Continued Medical Work up ? ?Expected Discharge Plan and Services ?Expected Discharge Plan: Home/Self Care ?In-house Referral: Clinical Social Work ?  ?Post Acute Care Choice: NA ?Living arrangements for the past 2 months: Hotel/Motel ?                ?DME Arranged: N/A ?DME Agency: NA ?  ?  ?  ?  ?  ?  ?  ?  ? ? ?Social Determinants of Health (SDOH) Interventions ?  ? ?Readmission Risk Interventions ?   ? View : No data to display.  ?  ?  ?  ? ? ?

## 2021-06-22 NOTE — Progress Notes (Signed)
?PROGRESS NOTE ? ?Felicia Warren [id number] DOB: 18-Feb-1971  ? ?PCP: Elsie Stain, MD ? ?Patient is from: [facility]hotel ? ?DOA: 06/15/2021 LOS: 6 ? ?Chief complaints ?Chief Complaint  ?Patient presents with  ? IVC  ?  ? ?Brief Narrative / Interim history: ?51 year old F with PMH of OSA/OHS on CPAP?,  HTN, morbid obesity and PE not on AC brought to ED by family members under IVC due to self-neglect and inability to get off the floor and refusing medical attention, and admitted for acute hypoxic respiratory failure.  Basic labs including CBC and CMP without significant finding.  CT head without acute finding.  Troponin negative x2.  BNP 200.  CTA chest negative for PE but body wall edema and signs of chronic pulmonary hypertension.  Echocardiogram ordered.  Psychiatry consulted due to self-neglect.  ? ?Patient was transferred to stepdown unit due to encephalopathy and respiratory failure with hypoxia requiring BiPAP.  ABG suggestive for chronic respiratory acidosis with hypercapnia.  TTE with LVEF of 60 to 65%, G2-DD and normal RVSF.  Respiratory status and mental status improved, and she was transferred to progressive care. ? ?Per psychiatry, patient has no psychiatric condition but IVC ineffective due to lack of capacity in the setting of encephalopathy/delirium.  Psychiatry signed off. ? ?Therapy recommends SNF unless she has 24-hour care.  She received investigating about this and her home CPAP. ?  ? ?Subjective: ?Seen and examined earlier this morning.  No major events overnight or this morning.  No complaints.  She denies chest pain, dyspnea, GI or UTI symptoms. ? ?Objective: ?Vitals:  ? 06/21/21 2300 06/22/21 0414 06/22/21 0427 06/22/21 1509  ?BP:  127/63  (!) 120/51  ?Pulse: 81 73 75 79  ?Resp: (!) 25 20 (!) 21 (!) 22  ?Temp:  98.2 ?F (36.8 ?C)  98.2 ?F (36.8 ?C)  ?TempSrc:  Oral  Oral  ?SpO2: 98% 99% 98% 99%  ?Weight:      ?Height:      ? ? ?Examination: ? ?GENERAL: No apparent distress.  Nontoxic. ?HEENT:  MMM.  Vision and hearing grossly intact.  ?NECK: Supple.  Difficult to assess JVD due to body habitus. ?RESP:  No IWOB.  Fair aeration bilaterally. ?CVS:  RRR. Heart sounds normal.  ?ABD/GI/GU: BS+. Abd soft, NTND.  ?MSK/EXT:  Moves extremities.  Difficult to assess edema due to body habitus. ?SKIN: no apparent skin lesion or wound ?NEURO: Awake and alert. Oriented appropriately.  No apparent focal neuro deficit. ?PSYCH: Calm. Normal affect.  ? ?Procedures:  ?None ? ?Microbiology summarized: ?COVID-19 and influenza PCR nonreactive. ?Blood cultures NGTD. ? ?Assessment and Plan: ?* Acute on chronic respiratory failure with hypoxia and hypercapnia (HCC) ?Likely due to OSA/possible OHS.  She also carries history of right heart failure.  ABG 7.39/71/77/43 suggesting chronic respiratory acidosis.  CTA chest negative for PE or infectious process but concerning body wall edema.  BNP 200, could be falsely low.  Serial troponin negative.  Fluid status difficult to assess due to body habitus.  She has history of diastolic CHF but not compliant with torsemide.  TTE with normal LVEF and G2 DD. ?-Continue BiPAP as needed and at night. ?-Minimum oxygen to keep saturation above 88% ?-Continue diuretics for CHF ?-TOC to investigate about patient's home CPAP and disposition. ? ?Chronic diastolic CHF (congestive heart failure) ([place]) ?TTE with LVEF of 60 to 65% and G2 DD but no other symptoms finding.  She does not look fluid overloaded on exam but difficult exam due to body  habitus.  BNP 200 but it could be falsely low in the setting of morbid obesity.  Respiratory failure likely from obstructive process such as OSA/OHS.  Not compliant with her home torsemide.  About 1 L UOP/24 hours.  Net -6 L.  ?-Continue torsemide 20 mg daily ?-Strict intake and output and daily weight ?-Monitor renal functions and electrolytes, and replace as appropriate ?-Sodium and fluid restriction. ? ?Involuntary commitment ?Patient was involuntary committed  by family member/sister due to self-neglect and refusal to seek medical attention.  Evaluated by psychiatry and deemed to lack capacity but no psychiatric condition.  At some point, she threatened to leave AMA but not anymore.  Not a danger to self or someone else ?-IVC rescinded.  Safety sitter discontinued. ? ?Acute metabolic encephalopathy ?Due to hypercapnia?  CT head and basic labs, UDS, TSH, ammonia  and B12 without significant finding.  Does not seem to be on meds with risk for encephalopathy.  Low suspicion for infectious process.  She is awake and oriented x4 but with limited insight. ?-Minimum oxygen to keep saturation above 88% and BiPAP/CPAP at night. ?-Reorientation and delirium precaution ? ?Hypokalemia and hypomagnesemia ?Resolved. ? ?Iron deficiency anemia ?Recent Labs  ?  07/15/20 ?0404 07/16/20 ?0406 07/17/20 ?[id number] 07/22/20 ?0358 06/15/21 ?2328 06/16/21 ?1046 06/17/21 ?0241 06/18/21 ?0429 06/19/21 ?0451 06/20/21 ?0434  ?HGB 10.2* 10.3* 10.9* 11.2* 12.1 12.0 10.9* 10.6* 10.7* 10.6*  ?H&H relatively stable and at baseline after initial drop likely from hemodilution.  Anemia panel suggested iron deficiency with ferritin of 12 and elevated TIBC. ?-IV ferric gluconate 250 mg on 4/28 ?-Continue monitoring ? ? ?Physical deconditioning ?PT/OT recommends SNF unless she has 24/7 care/supervision. ?-TOC investigating disposition and home CPAP issue ? ?Self neglect ?Seen involuntary commitment ? ?BMI 60.0-69.9, adult ([place]) ?Body mass index is 68.66 kg/m?. ?-Encourage lifestyle change to lose weight. ? 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 ? ?Prediabetes ?A1c 6.2%.  BMP glucose within acceptable range. ? ?History of pulmonary embolism ?Not on anticoagulation at home.  Refusing subcu Lovenox for VTE prophylaxis. ?-Continue Xarelto 10 mg daily.   ? ? ?DVT prophylaxis:  ?rivaroxaban (XARELTO) tablet 10 mg Start: 06/19/21 1000 ?rivaroxaban (XARELTO) tablet 10 mg  ?Code Status: Full code ?Family Communication:  None at bedside. ?Level of care: Progressive due to need for intermittent BiPAP ?Status is: Inpatient ?Remains inpatient appropriate because: Safe disposition/SNF ? ? ?Final disposition: SNF ?Consultants:  ?Psychiatry-signed off ? ?Sch Meds:  ?Scheduled Meds: ? Chlorhexidine Gluconate Cloth  6 each Topical Daily  ? pantoprazole  40 mg Oral QHS  ? potassium chloride  20 mEq Oral BID  ? rivaroxaban  10 mg Oral Daily  ? sodium chloride flush  3 mL Intravenous Q12H  ? torsemide  20 mg Oral Daily  ? ?Continuous Infusions: ? sodium chloride    ? ?PRN Meds:.sodium chloride, acetaminophen **OR** acetaminophen, ondansetron **OR** ondansetron (ZOFRAN) IV, sodium chloride flush ? ?Antimicrobials: ?Anti-infectives (From admission, onward)  ? ? Start     Dose/Rate Route Frequency Ordered Stop  ? 06/16/21 0115  cefTRIAXone (ROCEPHIN) 1 g in sodium chloride 0.9 % 100 mL IVPB  Status:  Discontinued       ? 1 g ?200 mL/hr over 30 Minutes Intravenous  Once 06/16/21 0101 06/16/21 0102  ? 06/16/21 0115  azithromycin (ZITHROMAX) 500 mg in sodium chloride 0.9 % 250 mL IVPB       ? 500 mg ?250 mL/hr over 60 Minutes Intravenous  Once 06/16/21 0101 06/16/21 0501  ?  06/16/21 0115  cefTRIAXone (ROCEPHIN) 2 g in sodium chloride 0.9 % 100 mL IVPB       ? 2 g ?200 mL/hr over 30 Minutes Intravenous  Once 06/16/21 0102 06/16/21 0415  ? ?  ? ? ? ?I have personally reviewed the following labs and images: ?CBC: ?Recent Labs  ?Lab 06/15/21 ?2328 06/16/21 ?1046 06/17/21 ?0241 06/18/21 ?0429 06/19/21 ?0451 06/20/21 ?0434  ?WBC 6.8 6.4 6.0 6.1 6.4 5.1  ?NEUTROABS 5.3  --   --   --   --   --   ?HGB 12.1 12.0 10.9* 10.6* 10.7* 10.6*  ?HCT 44.7 44.0 42.4 40.2 41.1 39.8  ?MCV 80.7 81.5 83.8 82.5 83.5 82.4  ?PLT 256 271 246 246 229 214  ? ?BMP &GFR ?Recent Labs  ?Lab 06/18/21 ?0429 06/19/21 ?0451 06/20/21 ?[id number] 06/21/21 ?[id number] 06/22/21 ?0403  ?NA 142 140 141 144 141  ?K 3.3* 4.0 3.4* 3.8 3.8  ?CL 98 98 94* 94* 96*  ?CO2 39* 38* 41* 43* 41*  ?GLUCOSE 100* 117*  94 93 102*  ?BUN 7 7 8 10 9   ?CREATININE 0.84 0.89 0.80 0.94 0.91  ?CALCIUM 8.0* 8.0* 7.9* 8.3* 8.1*  ?MG 1.8 1.8 1.4* 1.7 1.9  ?PHOS 3.1 3.6 3.7 3.8 3.8  ? ?Estimated Creatinine Clearance: 120.9 mL/m

## 2021-06-22 NOTE — Progress Notes (Signed)
Physical Therapy Treatment ?Patient Details ?Name: Felicia Warren ?MRN: [id number] ?DOB: Dec 28, 1970 ?Today's Date: 06/22/2021 ? ? ?History of Present Illness Anacelia Warren is a 51 y.o. female admitted 4/24 brought into the emergency room by her family as she had been in multiple room on the floor for several days not being able to get off on the floor refusing medical attention.  Most of the history is obtained from the chart as the patient denies all this.  Family states that she is depressed due to not having a job.  She has been IVC by the ED.  PMH: hypertension ? ?  ?PT Comments  ? ? Pt admitted with above diagnosis. Pt was able to ambulate with RW with overall better safety awareness than last visit. Pt was able to ambulate without LOB as well.  Pt progressing and had +2 for safety due to body habitus but is progressing to a +1 assist.  Pt currently with functional limitations due to balance and endurance deficits. Pt will benefit from skilled PT to increase their independence and safety with mobility to allow discharge to the venue listed below.      ?Recommendations for follow up therapy are one component of a multi-disciplinary discharge planning process, led by the attending physician.  Recommendations may be updated based on patient status, additional functional criteria and insurance authorization. ? ?Follow Up Recommendations ? Skilled nursing-short term rehab (<3 hours/day) ?  ?  ?Assistance Recommended at Discharge Intermittent Supervision/Assistance  ?Patient can return home with the following A little help with walking and/or transfers;A little help with bathing/dressing/bathroom;Assist for transportation ?  ?Equipment Recommendations ? None recommended by PT  ?  ?Recommendations for Other Services   ? ? ?  ?Precautions / Restrictions Precautions ?Precautions: Fall ?Restrictions ?Weight Bearing Restrictions: No  ?  ? ?Mobility ? Bed Mobility ?Overal bed mobility: Needs Assistance ?Bed Mobility: Supine to  Sit, Sit to Supine ?  ?  ?Supine to sit: Supervision ?  ?  ?General bed mobility comments: Pt moves on her own with incr time. ?  ? ?Transfers ?Overall transfer level: Needs assistance ?Equipment used: Conservation officer, nature (2 wheels) Angelia Mould) ?Transfers: Sit to/from Stand ?Sit to Stand: From elevated surface, Supervision, +2 safety/equipment, Min guard ?  ?  ?  ?  ?  ?General transfer comment: Pt needed min guard assist to stand. ?  ? ?Ambulation/Gait ?Ambulation/Gait assistance: +2 safety/equipment, Min guard, Supervision ?Gait Distance (Feet): 45 Feet ?Assistive device: Rolling walker (2 wheels) ?Gait Pattern/deviations: Step-through pattern, Decreased stride length, Wide base of support, Drifts right/left, Trunk flexed ?Gait velocity: decreased ?Gait velocity interpretation: <1.31 ft/sec, indicative of household ambulator ?  ?General Gait Details: Pt ambulated with bariatric RW and min guard assist +2 for safety with overall steady gait but moves slowly and very deliberate with each step.  Needed help steering RW.Pt HR 100-130 bpm with activity. SpO2 90% on 4L on arrival at rest.  O2 on RA at rest 85% therefore replaced O2 at 4L.    Desat as low as 88% but with pursed lip breathing able to incr to >90% on 4LO2.  Obtained incentive spirometer for pt and educated pt as to how to use it. ? ? ?Stairs ?  ?  ?  ?  ?  ? ? ?Wheelchair Mobility ?  ? ?Modified Rankin (Stroke Patients Only) ?  ? ? ?  ?Balance Overall balance assessment: Needs assistance ?Sitting-balance support: No upper extremity supported, Feet supported ?Sitting balance-Leahy Scale: Fair ?  ?  ?  Standing balance support: Bilateral upper extremity supported, During functional activity ?Standing balance-Leahy Scale: Poor ?Standing balance comment: Pt was able to stand with RW with bil UE support and external support. ?  ?  ?  ?  ?  ?  ?  ?  ?  ?  ?  ?  ? ?  ?Cognition Arousal/Alertness: Awake/alert ?Behavior During Therapy: [facility] for tasks assessed/performed,  Flat affect, Impulsive ?Overall Cognitive Status: No family/caregiver present to determine baseline cognitive functioning ?  ?  ?  ?  ?  ?  ?  ?  ?  ?  ?  ?  ?  ?  ?  ?  ?General Comments: Pt demonstrating flat affect and some impulsivity, stopping and resting with both arms during ambulation task.  Pt was not unsteady throughout session however. ?  ?  ? ?  ?Exercises General Exercises - Lower Extremity ?Ankle Circles/Pumps: AROM, Both, 10 reps, Seated ?Long Arc Quad: AROM, Both, 10 reps, Seated ?Hip Flexion/Marching: AROM, Both, 5 reps, Seated ? ?  ?General Comments   ?  ?  ? ?Pertinent Vitals/Pain Pain Assessment ?Pain Assessment: No/denies pain  ? ? ?Home Living   ?  ?  ?  ?  ?  ?  ?  ?  ?  ?   ?  ?Prior Function    ?  ?  ?   ? ?PT Goals (current goals can now be found in the care plan section) Acute Rehab PT Goals ?Patient Stated Goal: to go home ?Progress towards PT goals: Progressing toward goals ? ?  ?Frequency ? ? ? Min 3X/week ? ? ? ?  ?PT Plan Current plan remains appropriate  ? ? ?Co-evaluation   ?  ?  ?  ?  ? ?  ?AM-PAC PT "6 Clicks" Mobility   ?Outcome Measure ? Help needed turning from your back to your side while in a flat bed without using bedrails?: None ?Help needed moving from lying on your back to sitting on the side of a flat bed without using bedrails?: None ?Help needed moving to and from a bed to a chair (including a wheelchair)?: A Little ?Help needed standing up from a chair using your arms (e.g., wheelchair or bedside chair)?: A Little ?Help needed to walk in hospital room?: A Little ?Help needed climbing 3-5 steps with a railing? : Total ?6 Click Score: 18 ? ?  ?End of Session Equipment Utilized During Treatment: Gait belt;Oxygen (4L via [place]) ?Activity Tolerance: Patient limited by fatigue ?Patient left: with call bell/phone within reach;in chair ?Nurse Communication: Mobility status ?PT Visit Diagnosis: Unsteadiness on feet (R26.81);Muscle weakness (generalized) (M62.81) ?  ? ? ?Time:  [id number] ?PT Time Calculation (min) (ACUTE ONLY): 42 min ? ?Charges:  $Gait Training: 23-37 mins ?$Therapeutic Exercise: 8-22 mins          ?          ? ?Amdrew Oboyle M,PT ?Acute Rehab Services ?[phone] ?[phone] (pager)  ? ? ?Alvira Philips ?06/22/2021, 11:24 AM ? ?

## 2021-06-23 DIAGNOSIS — Z046 Encounter for general psychiatric examination, requested by authority: Secondary | ICD-10-CM | POA: Diagnosis not present

## 2021-06-23 DIAGNOSIS — I5032 Chronic diastolic (congestive) heart failure: Secondary | ICD-10-CM | POA: Diagnosis not present

## 2021-06-23 DIAGNOSIS — J9621 Acute and chronic respiratory failure with hypoxia: Secondary | ICD-10-CM | POA: Diagnosis not present

## 2021-06-23 DIAGNOSIS — G9341 Metabolic encephalopathy: Secondary | ICD-10-CM | POA: Diagnosis not present

## 2021-06-23 LAB — RENAL FUNCTION PANEL
Albumin: 2.9 g/dL — ABNORMAL LOW (ref 3.5–5.0)
Anion gap: 5 (ref 5–15)
BUN: 10 mg/dL (ref 6–20)
CO2: 39 mmol/L — ABNORMAL HIGH (ref 22–32)
Calcium: 7.9 mg/dL — ABNORMAL LOW (ref 8.9–10.3)
Chloride: 94 mmol/L — ABNORMAL LOW (ref 98–111)
Creatinine, Ser: 0.8 mg/dL (ref 0.44–1.00)
GFR, Estimated: >60 mL/min (ref >60)
Glucose, Bld: 103 mg/dL — ABNORMAL HIGH (ref 70–99)
Phosphorus: 3.4 mg/dL (ref 2.5–4.6)
Potassium: 4 mmol/L (ref 3.5–5.1)
Sodium: 138 mmol/L (ref 135–145)

## 2021-06-23 NOTE — Consult Note (Signed)
Per chart review, patient was admitted on April 25 under IVC due to self-neglect and inability to get off the floor, refusing medical attention and acute hypoxic respiratory failure.  Psychiatry was initially consulted due to self-neglect, and concerns regarding depression from family members secondary to job loss.  During patient's 1 week length of stay she was subsequently transferred to intensive care due to encephalopathy and respiratory failure requiring BiPAP management.  Respiratory status and mental status have since improved, and psychiatry was again reconsulted for capacity to refuse skilled nursing facility. ? ?On interview, Ms. Felicia Warren reports that she is receiving treatment for her lungs and her oxygen levels.  She states her propose treatment was potassium, diuretic, and BiPAP at night.  She is unaware of any other alternatives and or options she has.  Patient is also unaware of options for refusing proposed treatment.  Patient is unable to provide reasons and or consequences of accepting the proposed treatment.  She reports " I was going to get another job.  Everything was going so well before I got fired from [employer]  We got to work on getting stronger.  I do not like the idea of rehabilitation.  I think I will be more comfortable at home.  I preferred to live at extended stay and would love to see that happen in someone come assist me throughout the day.  My family wants me to go to rehab.  In terms of money, I do not have a lot of options.  I do not want to give up.  Rehabilitation is not comfortable for me, nursing homes are all the same.  They do not think I can take care of myself.  I know I have more hope around me than people think I do.  They want to put me in a nursing home, I cannot make it on my own.  I know my unemployment will be running out soon, as long as I have water and juice I am okay.  I am getting some assistance for food."  ? ?Patient is alert and oriented, and cooperative.   She does become tearful at 1 time during that capacity evaluation when referencing hope to get better.  She denies any previous psychiatric history and or medical impairment that will limit her ability to make appropriate decision making such as refusing skilled nursing facility.  She further denies any history of self-harm, suicide attempts, and or suicidal ideations.  She denies a history of mood symptoms, agitation, delusions, and or impulsivity. ?  ?Hospital course is complicated with acute respiratory failure and metabolic encephalopathy. Psychiatry is consulted for capacity evaluation for patient to be refused skilled nursing facility. ?  ?# Capacity evaluation to refuse skilled nursing facility ?At the present time, the psychiatry consultation service believes the patient does not have decisional capacity with refuse skilled nursing facility. Criteria for decision making capacity requires patient be able to: (1) communicate a choice in a clear and consistent manner, (2) demonstrate adequate understanding of disclosed relevant information regarding her/his medical condition and treatment, possible benefits and risks of that treatment, and alternative approaches, (3) describe views of her/his medical condition, proposed treatment and its consequences, and (4) engage in a rational process of manipulating the relevant information to reach her/his decision. Based on our examination, the patient does not meet those criteria. It should be emphasized however, that capacity may need to be re-assessed, as the patient?s mental status changes over time. Also, decisional capacity for other, specific treatment/disposition  decisions will need to be evaluated on an individual basis. ?  ?A substitute decision maker must be sought to authorize medical intervention or to refuse treatment on behalf of the patient; for patients with advance directives, either the treatment choice that the patient made in advance or the choice of  a surrogate decision maker may be indicated. In the absence of an advance directive and when time is available, the recommendation is usually to contact family members (the priority order to be approached is the spouse, adult children, parents, siblings, and other relatives).  ? ?As for the capacity consult, I talked with the consulting hospitalist, and TOC regarding specific question of capacity to refuse SNF placement.  Patient's capacity will continue to wax and wane, it is obvious patient will benefit from a competency evaluation.  If the patient requires competency evaluation, this is best evaluated by the primary care physician who knows the patient best, and the paperwork is then submitted to a judge, who will officially and legally evaluate the patient's competency.  ?

## 2021-06-23 NOTE — Plan of Care (Signed)
  Problem: Clinical Measurements: Goal: Diagnostic test results will improve Outcome: Progressing Goal: Respiratory complications will improve Outcome: Progressing Goal: Cardiovascular complication will be avoided Outcome: Progressing   Problem: Elimination: Goal: Will not experience complications related to bowel motility Outcome: Progressing   

## 2021-06-23 NOTE — TOC Progression Note (Addendum)
Transition of Care (TOC) - Progression Note  ? ? ?Patient Details  ?Name: Kolleen Klare ?MRN: [id number] ?Date of Birth: 1970-09-17 ? ?Transition of Care (TOC) CM/SW Contact  ?Lanier Clam, RN ?Phone Number: ?06/23/2021, 11:34 AM ? ?Clinical Narrative: Noted per MD/Psych-lack capacity in setting of medical condition. Recc for psych eval if medically still lack capacity.No bed offers. Supv notified for eval of DTP list.  ?-2:15p-Noted lacks capacity to make decisions-[name] sister agreed to sign patient into SNF-[facility] rep Delorise Shiner accepted-will start auth. TOC awaiting pasrr-[facility] assigned.  ? ? ? ?Expected Discharge Plan:  (TBD) ?Barriers to Discharge: Continued Medical Work up ? ?Expected Discharge Plan and Services ?Expected Discharge Plan:  (TBD) ?In-house Referral: Clinical Social Work ?  ?Post Acute Care Choice: NA ?Living arrangements for the past 2 months: Hotel/Motel ?                ?DME Arranged: N/A ?DME Agency: NA ?  ?  ?  ?  ?  ?  ?  ?  ? ? ?Social Determinants of Health (SDOH) Interventions ?  ? ?Readmission Risk Interventions ?   ? View : No data to display.  ?  ?  ?  ? ? ?

## 2021-06-23 NOTE — Progress Notes (Signed)
?PROGRESS NOTE ? ?Felicia Warren [id number] DOB: 16-Apr-1970  ? ?PCP: Elsie Stain, MD ? ?Patient is from: Motel/hotel ? ?DOA: 06/15/2021 LOS: 7 ? ?Chief complaints ?Chief Complaint  ?Patient presents with  ? IVC  ?  ? ?Brief Narrative / Interim history: ?51 year old F with PMH of OSA/OHS on CPAP?,  HTN, morbid obesity and PE not on AC brought to ED by family members under IVC due to self-neglect and inability to get off the floor and refusing medical attention, and admitted for acute hypoxic respiratory failure.  Basic labs including CBC and CMP without significant finding.  CT head without acute finding.  Troponin negative x2.  BNP 200.  CTA chest negative for PE but body wall edema and signs of chronic pulmonary hypertension.  Echocardiogram ordered.  Psychiatry consulted due to self-neglect.  ? ?Patient was transferred to stepdown unit due to encephalopathy and respiratory failure with hypoxia requiring BiPAP.  ABG suggestive for chronic respiratory acidosis with hypercapnia.  TTE with LVEF of 60 to 65%, G2-DD and normal RVSF.  Respiratory status and mental status improved, and she was transferred to progressive care. ? ?Per psychiatry, patient has no psychiatric condition but IVC ineffective due to lack of capacity in the setting of encephalopathy/delirium.  Psychiatry signed off.  ? ?Therapy recommends SNF unless she has 24-hour care.  Patient prefers to go back to her extended stay but remains deconditioned and still does not seem to have medical decision-making capacity.  Psychiatry reconsulted at [facility] request to reevaluate capacity.  She is otherwise medically optimized.  ?  ? ?Subjective: ?Seen and examined earlier this morning.  No major events overnight of this morning.  She is awake and oriented x4 but very limited insight.  She has no understanding about her physical deconditioning and serious nature of her respiratory failure.  She was not able to clearly articulate why she was in the hospital.   ? ?Objective: ?Vitals:  ? 06/22/21 0427 06/22/21 1509 06/22/21 2219 06/23/21 0501  ?BP:  (!) 120/51 (!) 104/49 116/68  ?Pulse: 75 79 87 67  ?Resp: (!) 21 (!) 22 20 20   ?Temp:  98.2 ?F (36.8 ?C) 98.4 ?F (36.9 ?C) 98 ?F (36.7 ?C)  ?TempSrc:  Oral Oral Oral  ?SpO2: 98% 99% 97% 100%  ?Weight:      ?Height:      ? ? ?Examination: ? ?GENERAL: No apparent distress.  Nontoxic. ?HEENT: MMM.  Vision and hearing grossly intact.  ?NECK: Supple.  Difficult to assess JVD. ?RESP:  No IWOB.  Fair aeration bilaterally but limited exam. ?CVS:  RRR. Heart sounds normal.  ?ABD/GI/GU: BS+. Abd soft, NTND.  ?MSK/EXT:  Moves extremities. No apparent deformity. No edema but difficult exam due to body habitus.  ?SKIN: no apparent skin lesion or wound ?NEURO: Awake and alert. Oriented appropriately.  No apparent focal neuro deficit. ?PSYCH: Calm. Normal affect.  ? ?Procedures:  ?None ? ?Microbiology summarized: ?COVID-19 and influenza PCR nonreactive. ?Blood cultures NGTD. ? ?Assessment and Plan: ?* Acute on chronic respiratory failure with hypoxia and hypercapnia (HCC) ?Likely due to OSA/possible OHS.  She also carries history of right heart failure.  ABG 7.39/71/77/43 suggesting chronic respiratory acidosis.  CTA chest negative for PE or infectious process but concerning body wall edema.  BNP 200, could be falsely low.  Serial troponin negative.  Fluid status difficult to assess due to body habitus.  She has history of diastolic CHF but not compliant with torsemide.  TTE with normal LVEF and G2 DD. ?-  Continue BiPAP as needed and at night. ?-Minimum oxygen to keep saturation above 88% ?-Continue diuretics for CHF ? ?Chronic diastolic CHF (congestive heart failure) ([place]) ?TTE with LVEF of 60 to 65% and G2 DD but no other symptoms finding.  She does not look fluid overloaded on exam but difficult exam due to body habitus.  BNP 200 but it could be falsely low in the setting of morbid obesity.  Respiratory failure likely from obstructive  process such as OSA/OHS.  Not compliant with her home torsemide.  Good urine output.  Net negative several liters.  Renal function stable. ?-Continue torsemide 20 mg daily ?-Strict intake and output and daily weight ?-Monitor renal functions and electrolytes, and replace as appropriate ?-Sodium and fluid restriction. ? ?Involuntary commitment ?Patient was involuntary committed by family member/sister due to self-neglect and refusal to seek medical attention.  Evaluated by psychiatry and deemed to lack capacity but no psychiatric condition.  At some point, she threatened to leave AMA but not anymore.  Not a danger to self or someone else ?-IVC rescinded.  Safety sitter discontinued. ? ?Acute metabolic encephalopathy ?Due to hypercapnia?  CT head and basic labs, UDS, TSH, ammonia  and B12 without significant finding.  Does not seem to be on meds with risk for encephalopathy.  Low suspicion for infectious process.  She is awake and oriented x4 but with limited insight.  Deemed to lack medical decision-making capacity by psychiatry on admission. ?-Minimum oxygen to keep saturation above 88% and BiPAP/CPAP at night. ?-Reorientation and delirium precaution ?-Psychiatry consulted to reassess capacity at [facility] request ? ?Hypokalemia and hypomagnesemia ?Resolved. ? ?Iron deficiency anemia ?Recent Labs  ?  07/15/20 ?0404 07/16/20 ?0406 07/17/20 ?[id number] 07/22/20 ?0358 06/15/21 ?2328 06/16/21 ?1046 06/17/21 ?0241 06/18/21 ?0429 06/19/21 ?0451 06/20/21 ?0434  ?HGB 10.2* 10.3* 10.9* 11.2* 12.1 12.0 10.9* 10.6* 10.7* 10.6*  ?H&H relatively stable and at baseline after initial drop likely from hemodilution.  Anemia panel suggested iron deficiency with ferritin of 12 and elevated TIBC. ?-IV ferric gluconate 250 mg on 4/28 ?-Continue monitoring ? ? ?Physical deconditioning ?PT/OT recommends SNF unless she has 24/7 care/supervision. ?-TOC investigating disposition and home CPAP issue ? ?Self neglect ?Seen involuntary commitment ? ?BMI  60.0-69.9, adult ([place]) ?Body mass index is 68.66 kg/m?. ?-Encourage lifestyle change to lose weight. ? 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 ? ?Prediabetes ?A1c 6.2%.  BMP glucose within acceptable range. ? ?History of pulmonary embolism ?Not on anticoagulation at home.  Refusing subcu Lovenox for VTE prophylaxis. ?-Continue Xarelto 10 mg daily.   ? ? ?DVT prophylaxis:  ?rivaroxaban (XARELTO) tablet 10 mg Start: 06/19/21 1000 ?rivaroxaban (XARELTO) tablet 10 mg  ?Code Status: Full code ?Family Communication: Updated patient's sister, Jenny Reichmann over the phone. ?Level of care: Progressive due to need for intermittent BiPAP ?Status is: Inpatient ?Remains inpatient appropriate because: Safe disposition/SNF.  ? ? ?Final disposition: SNF ?Consultants:  ?Psychiatry ? ?Sch Meds:  ?Scheduled Meds: ? Chlorhexidine Gluconate Cloth  6 each Topical Daily  ? pantoprazole  40 mg Oral QHS  ? potassium chloride  20 mEq Oral BID  ? rivaroxaban  10 mg Oral Daily  ? sodium chloride flush  3 mL Intravenous Q12H  ? torsemide  20 mg Oral Daily  ? ?Continuous Infusions: ? sodium chloride    ? ?PRN Meds:.sodium chloride, acetaminophen **OR** acetaminophen, ondansetron **OR** ondansetron (ZOFRAN) IV, sodium chloride flush ? ?Antimicrobials: ?Anti-infectives (From admission, onward)  ? ? Start     Dose/Rate Route Frequency Ordered Stop  ?  06/16/21 0115  cefTRIAXone (ROCEPHIN) 1 g in sodium chloride 0.9 % 100 mL IVPB  Status:  Discontinued       ? 1 g ?200 mL/hr over 30 Minutes Intravenous  Once 06/16/21 0101 06/16/21 0102  ? 06/16/21 0115  azithromycin (ZITHROMAX) 500 mg in sodium chloride 0.9 % 250 mL IVPB       ? 500 mg ?250 mL/hr over 60 Minutes Intravenous  Once 06/16/21 0101 06/16/21 0501  ? 06/16/21 0115  cefTRIAXone (ROCEPHIN) 2 g in sodium chloride 0.9 % 100 mL IVPB       ? 2 g ?200 mL/hr over 30 Minutes Intravenous  Once 06/16/21 0102 06/16/21 0415  ? ?  ? ? ? ?I have personally reviewed the following labs and images: ?CBC: ?Recent  Labs  ?Lab 06/17/21 ?0241 06/18/21 ?0429 06/19/21 ?0451 06/20/21 ?[id number]  ?WBC 6.0 6.1 6.4 5.1  ?HGB 10.9* 10.6* 10.7* 10.6*  ?HCT 42.4 40.2 41.1 39.8  ?MCV 83.8 82.5 83.5 82.4  ?PLT 246 246 229 214  ? ?BMP &GFR ?

## 2021-06-23 NOTE — Progress Notes (Signed)
Pt placed on bipap for the night. °

## 2021-06-24 LAB — RENAL FUNCTION PANEL
Albumin: 2.7 g/dL — ABNORMAL LOW (ref 3.5–5.0)
Anion gap: 5 (ref 5–15)
BUN: 10 mg/dL (ref 6–20)
CO2: 38 mmol/L — ABNORMAL HIGH (ref 22–32)
Calcium: 8.1 mg/dL — ABNORMAL LOW (ref 8.9–10.3)
Chloride: 97 mmol/L — ABNORMAL LOW (ref 98–111)
Creatinine, Ser: 0.77 mg/dL (ref 0.44–1.00)
GFR, Estimated: >60 mL/min (ref >60)
Glucose, Bld: 101 mg/dL — ABNORMAL HIGH (ref 70–99)
Phosphorus: 4.4 mg/dL (ref 2.5–4.6)
Potassium: 3.9 mmol/L (ref 3.5–5.1)
Sodium: 140 mmol/L (ref 135–145)

## 2021-06-24 LAB — MAGNESIUM: Magnesium: 1.7 mg/dL (ref 1.7–2.4)

## 2021-06-24 NOTE — TOC Progression Note (Signed)
Transition of Care (TOC) - Progression Note  ? ? ?Patient Details  ?Name: Felicia Warren ?MRN: [id number] ?Date of Birth: 1970-07-12 ? ?Transition of Care (TOC) CM/SW Contact  ?Lanier Clam, RN ?Phone Number: ?06/24/2021, 12:04 PM ? ?Clinical Narrative: [place] rep Grace-still awaiting auth;awaiting pasrr-has been assigned for review by [facility]. Sister [name]-agreed to sign patient into SNF.  ? ? ? 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 ?Expected Discharge Plan and Services ?Expected Discharge Plan: Skilled Nursing Facility ?In-house Referral: Clinical Social Work ?  ?Post Acute Care Choice: NA ?Living arrangements for the past 2 months: Hotel/Motel ?                ?DME Arranged: N/A ?DME Agency: NA ?  ?  ?  ?  ?  ?  ?  ?  ? ? ?Social Determinants of Health (SDOH) Interventions ?  ? ?Readmission Risk Interventions ?   ? View : No data to display.  ?  ?  ?  ? ? ?

## 2021-06-24 NOTE — Progress Notes (Signed)
Occupational Therapy Treatment ?Patient Details ?Name: Felicia Warren ?MRN: [id number] ?DOB: 1970/08/30 ?Today's Date: 06/24/2021 ? ? ?History of present illness Felicia Warren is a 51 y.o. female admitted 4/24 brought into the emergency room by her family as she had been in multiple room on the floor for several days not being able to get off on the floor refusing medical attention.  Most of the history is obtained from the chart as the patient denies all this.  Family states that she is depressed due to not having a job.  She has been IVC by the ED.  PMH: hypertension ?  ?OT comments ? Patient was noted to have slightly improve standing balance to participate in hygiene tasks in standing with RW. Patient was noted to need 2L/min O2 to maintain O2 saturation with activity. Patient would continue to benefit from skilled OT services at this time while admitted and after d/c to address noted deficits in order to improve overall safety and independence in ADLs.  ?  ? ?Recommendations for follow up therapy are one component of a multi-disciplinary discharge planning process, led by the attending physician.  Recommendations may be updated based on patient status, additional functional criteria and insurance authorization. ?   ?Follow Up Recommendations ? Skilled nursing-short term rehab (<3 hours/day)  ?  ?Assistance Recommended at Discharge Frequent or constant Supervision/Assistance  ?Patient can return home with the following ? A little help with walking and/or transfers;A lot of help with bathing/dressing/bathroom ?  ?Equipment Recommendations ? None recommended by OT  ?  ?Recommendations for Other Services   ? ?  ?Precautions / Restrictions Precautions ?Precautions: Fall ?Restrictions ?Weight Bearing Restrictions: No  ? ? ?  ? ?Mobility Bed Mobility ?Overal bed mobility: Needs Assistance ?Bed Mobility: Supine to Sit, Sit to Supine ?  ?  ?Supine to sit: Supervision ?Sit to supine: Min assist ?  ?General bed mobility  comments: Pt moves on her own with incr time. ?  ? ?Transfers ?  ?  ?  ?  ?  ?  ?  ?  ?  ?  ?  ?  ?Balance Overall balance assessment: Needs assistance ?Sitting-balance support: No upper extremity supported, Feet supported ?Sitting balance-Leahy Scale: Fair ?  ?  ?Standing balance support: During functional activity, Single extremity supported ?Standing balance-Leahy Scale: Fair ?  ?  ?  ?  ?  ?  ?  ?  ?  ?  ?  ?  ?   ? ?ADL either performed or assessed with clinical judgement  ? ?ADL Overall ADL's : Needs assistance/impaired ?  ?  ?  ?  ?  ?  ?  ?  ?  ?  ?  ?  ?  ?Toilet Transfer Details (indicate cue type and reason): patient transfered from edge of bed to recliner in room wiht min A with Bari walker with max A for O2 cord management. ?  ?Toileting - Clothing Manipulation Details (indicate cue type and reason): patient was mod A for hygiene standing with RW after noted incontince. patient was noted to drop below 90% on 1L/min with bumped back up to 2L/ min wiht patient able to maintain above 90% on 2L/min with activity nurse made aware. ?  ?  ?  ?  ?  ? ?Extremity/Trunk Assessment   ?  ?  ?  ?  ?  ? ?Vision   ?  ?  ?Perception   ?  ?Praxis   ?  ? ?Cognition   ?  ?  ?  ?  ?  ?  ?  ?  ?  ?  ?  ?  ?  ?  ?  ?  ?  ?  ?  ?  ?  ?   ?  Exercises   ? ?  ?Shoulder Instructions   ? ? ?  ?General Comments    ? ? ?Pertinent Vitals/ Pain       Pain Assessment ?Pain Assessment: No/denies pain ? ?Home Living   ?  ?  ?  ?  ?  ?  ?  ?  ?  ?  ?  ?  ?  ?  ?  ?  ?  ?  ? ?  ?Prior Functioning/Environment    ?  ?  ?  ?   ? ?Frequency ? Min 2X/week  ? ? ? ? ?  ?Progress Toward Goals ? ?OT Goals(current goals can now be found in the care plan section) ? Progress towards OT goals: Progressing toward goals ? ?   ?Plan Discharge plan remains appropriate   ? ?Co-evaluation ? ? ?   ?  ?  ?  ?  ? ?  ?AM-PAC OT "6 Clicks" Daily Activity     ?Outcome Measure ? ? Help from another person eating meals?: None ?Help from another person taking care of  personal grooming?: A Little ?Help from another person toileting, which includes using toliet, bedpan, or urinal?: A Lot ?Help from another person bathing (including washing, rinsing, drying)?: A Lot ?Help from another person to put on and taking off regular upper body clothing?: A Little ?Help from another person to put on and taking off regular lower body clothing?: A Lot ?6 Click Score: 16 ? ?  ?End of Session Equipment Utilized During Treatment: Rolling walker (2 wheels);Oxygen ? ?OT Visit Diagnosis: Unsteadiness on feet (R26.81) ?  ?Activity Tolerance Patient tolerated treatment well ?  ?Patient Left in bed;with call bell/phone within reach;with nursing/sitter in room ?  ?Nurse Communication Mobility status;Other (comment) ?  ? ?   ? ?Time: 3818-2993 ?OT Time Calculation (min): 27 min ? ?Charges: OT General Charges ?$OT Visit: 1 Visit ?OT Treatments ?$Self Care/Home Management : 23-37 mins ? ?Vrishank Moster OTR/L, MS ?Acute Rehabilitation Department ?Office# [phone] ?Pager# [phone] ? ? ?[name] ?06/24/2021, 4:07 PM ?

## 2021-06-24 NOTE — Progress Notes (Signed)
?PROGRESS NOTE ?Felicia Warren  [id number] DOB: Aug 25, 1970 DOA: 06/15/2021 ?PCP: Elsie Stain, MD  ? ?Brief Narrative/Hospital Course: ?51 year old F with PMH of OSA/OHS on CPAP?,  HTN, morbid obesity and PE not on AC brought to ED by family members under IVC due to self-neglect and inability to get off the floor and refusing medical attention, and admitted for acute hypoxic respiratory failure.  Basic labs including CBC and CMP without significant finding.  CT head without acute finding.  Troponin negative x2.  BNP 200.  CTA chest negative for PE but body wall edema and signs of chronic pulmonary hypertension.  Echocardiogram ordered.  Psychiatry consulted due to self-neglect.  ? ?Patient was transferred to stepdown unit due to encephalopathy and respiratory failure with hypoxia requiring BiPAP.  ABG suggestive for chronic respiratory acidosis with hypercapnia.  TTE with LVEF of 60 to 65%, G2-DD and normal RVSF.  Respiratory status and mental status improved, and she was transferred to progressive care. ? ?Per psychiatry, patient has no psychiatric condition but IVC ineffective due to lack of capacity in the setting of encephalopathy/delirium.  Psychiatry signed off.  ? ?Therapy recommends SNF unless she has 24-hour care.  Patient prefers to go back to her extended stay but remains deconditioned and still does not seem to have medical decision-making capacity.  Psychiatry reconsulted at [facility] request to reevaluate capacity.  She is otherwise medically optimized.  Psychiatry reevaluated and noted patient capacity waxes and wanes-has generally lacked decisional capacity over multiple evaluations due to poor insight into medical condition, patient has good communication but no understanding,reasoning or rational decision making.  ?  ?Subjective: ?Seen and examined.  Resting comfortably conversant alert awake ?Overnight afebrile, used BiPAP, labs remain stable. ?  ?Assessment and Plan: ?Principal Problem: ?  Acute  on chronic respiratory failure with hypoxia and hypercapnia (HCC) ?Active Problems: ?  Acute respiratory failure with hypoxia ([place]) ?  Acute metabolic encephalopathy ?  Involuntary commitment ?  Chronic diastolic CHF (congestive heart failure) ([place]) ?  BMI 60.0-69.9, adult ([place]) ?  Self neglect ?  Physical deconditioning ?  Iron deficiency anemia ?  Hypokalemia and hypomagnesemia ?  History of pulmonary embolism ?  Hypokalemia ?  Prediabetes ?  OSA/OHS on CPAP ?  Hypomagnesemia ?  ?Acute on chronic respiratory failure with hypoxia and hypercapnia ?OSA/OHS on CPAP ?Presentation likely multifactorial suspected OSA/OHS, history of chronic diastolic CHF/right heart failure, ABG 7.39/71/77/43 suggesting chronic respiratory acidosis. CTA-no PE or infectious process but concerning body wall edema.  BNP 200, could be falsely low.Serial troponin negative.  Difficult to assess fluid status due to body habitus.  Noncompliant torsemide, echo shows EF more G2 DD, at this time overall much improved continue BiPAP qhs/prn, PT OT ambulation, diuretics ? ?Chronic diastolic CHF: TTELVEF of 60 to 65% and G2 DD.  Not grossly fluid overloaded but not compliant torsemide, at this time stable on torsemide, BNP 200 could be falsely low due to obesity.  Continue to monitor intake output Daily weight continue salt sodium restriction and monitor renal function ?Net IO Since Admission: -8,906 mL [06/24/21 1121]  ?Filed Weights  ? 06/15/21 2318 06/19/21 0501  ?Weight: (!) 181.4 kg (!) 182.6 kg  ?  ? ?Involuntary commitment: IVC initially by family member/sister due to self-neglect and refusal to seek medical attention.  Evaluated by psychiatry and deemed to lack capacity but no psychiatric condition.  At some point, she threatened to leave AMA but not anymore.  Not a danger to self or someone  else. ?-IVC rescinded.  Safety sitter discontinued. ? ?Acute metabolic encephalopathy ?Due to hypercapnia?  CT head and basic labs, UDS, TSH, ammonia  and  B12 without significant finding.  Does not seem to be on meds with risk for encephalopathy.  Low suspicion for infectious process.  She is awake and oriented x4 but with limited insight.  Deemed to lack medical decision-making capacity by psychiatry on admission. ?Psychiatry reevaluated and noted patient capacity waxes and wanes-has generally lacked decisional capacity over multiple evaluations due to poor insight into medical condition, patient has good communication but no understanding,reasoning or rational decision making.Continue pulmonary support, reorientation delirium precaution, ambulation.  Awaiting on placement. ? ?Hypokalemia and hypomagnesemia: Resolved ? ?Iron deficiency anemia: ?IV ferric gluconate on 4/28, H&H stable.  Monitor ? ?Physical deconditioning/debility: ?PT/OT recommends SNF unless she has 24/7 care/supervision. ?TOC awaiting for insurance approval/BS air ?Self neglect ?Prediabetes:A1c 6.2%.  Needs diet control ? ?History of pulmonary embolism:Not on anticoagulation at home.  Refusing subcu Lovenox for VTE prophylaxis.Continue Xarelto 10 mg daily.   ? ?Morbid obesity:Patient's Body mass index is 69.1 kg/m?. : Will benefit with PCP follow-up, weight loss and healthy lifestyle ? ?DVT prophylaxis: rivaroxaban (XARELTO) tablet 10 mg Start: 06/19/21 1000 ?Code Status:   Code Status: Full Code ?Family Communication: plan of care discussed with patient at bedside. ?Patient status is: Inpatient because of unsafe disposition ?Level of care: Progressive  ? ?Dispo: The patient is from: Extended stay ?           Anticipated disposition: Skilled nursing facility once approved ? ?Mobility Assessment (last 72 hours)   ? ? Mobility Assessment   ? ? [place] Name 06/23/21 0920 06/22/21 1100 06/22/21 0800 06/21/21 1929  ?  ? Does patient have an order for bedrest or is patient medically unstable No - Continue assessment -- No - Continue assessment No - Continue assessment   ? What is the highest level of mobility  based on the progressive mobility assessment? Level 5 (Walks with assist in room/hall) - Balance while stepping forward/back and can walk in room with assist - Complete Level 5 (Walks with assist in room/hall) - Balance while stepping forward/back and can walk in room with assist - Complete Level 5 (Walks with assist in room/hall) - Balance while stepping forward/back and can walk in room with assist - Complete Level 4 (Walks with assist in room) - Balance while marching in place and cannot step forward and back - Complete   ? Is the above level different from baseline mobility prior to current illness? Yes - Recommend PT order -- Yes - Recommend PT order --   ? ?  ?  ? ?  ?  ? ?Objective: ?Vitals last 24 hrs: ?Vitals:  ? 06/23/21 1333 06/23/21 1400 06/23/21 2034 06/24/21 0542  ?BP: 139/68  (!) 120/53 (!) 117/58  ?Pulse: 68  68 (!) 59  ?Resp:  19 18 18   ?Temp: 98.7 ?F (37.1 ?C)  98 ?F (36.7 ?C) 98 ?F (36.7 ?C)  ?TempSrc: Oral  Oral Oral  ?SpO2: 98%  97% 97%  ?Weight:      ?Height:      ? ?Weight change:  ? ?Physical Examination: ?General exam: alert awake,older than stated age, weak appearing. ?HEENT:Oral mucosa moist, Ear/Nose WNL grossly, dentition normal. ?Respiratory system: bilaterally diminished BS, no use of accessory muscle ?Cardiovascular system: S1 & S2 +, No JVD. ?Gastrointestinal system: Abdomen soft,NT,ND, BS+ ?Nervous System:Alert, awake, moving extremities and grossly nonfocal ?Extremities: LE edema chronic appearing,distal  peripheral pulses palpable.  ?Skin: No rashes,no icterus. ?MSK: Normal muscle bulk,tone, power ? ?Medications reviewed:  ?Scheduled Meds: ? Chlorhexidine Gluconate Cloth  6 each Topical Daily  ? pantoprazole  40 mg Oral QHS  ? rivaroxaban  10 mg Oral Daily  ? sodium chloride flush  3 mL Intravenous Q12H  ? torsemide  20 mg Oral Daily  ? ?Continuous Infusions: ? sodium chloride    ? ? ?  ?Diet Order   ? ?       ?  Diet Heart Room service appropriate? Yes; Fluid consistency: Thin;  Fluid restriction: 1500 mL Fluid  Diet effective now       ?  ? ?  ?  ? ?  ?  ? ?  ?  ?  ? ? ?Intake/Output Summary (Last 24 hours) at 06/24/2021 1117 ?Last data filed at 06/24/2021 0543 ?Gross per 24 hour

## 2021-06-24 NOTE — Progress Notes (Signed)
Physical Therapy Treatment ?Patient Details ?Name: Felicia Warren ?MRN: [id number] ?DOB: 1970-12-01 ?Today's Date: 06/24/2021 ? ? ?History of Present Illness Felicia Warren is a 51 y.o. female admitted 4/24 brought into the emergency room by her family as she had been in multiple room on the floor for several days not being able to get off on the floor refusing medical attention.  Most of the history is obtained from the chart as the patient denies all this.  Family states that she is depressed due to not having a job.  She has been IVC by the ED.  PMH: hypertension ? ?  ?PT Comments  ? ? Pt admitted with above diagnosis. Pt was able to ambulate with min guard to supervision with RW with incr distance.  Her safety continues to improve each day. She is still requiring O2 at 3L to maintain sats.  Pt will need continued PT to incr endurance so that she can care for herself at home.  Pt does still need education regarding need for O2 as pt doesn't seem to think she needs it when she definitely does.    Pt currently with functional limitations due to balance and endurance deficits. Pt will benefit from skilled PT to increase their independence and safety with mobility to allow discharge to the venue listed below.      ?Recommendations for follow up therapy are one component of a multi-disciplinary discharge planning process, led by the attending physician.  Recommendations may be updated based on patient status, additional functional criteria and insurance authorization. ? ?Follow Up Recommendations ? Skilled nursing-short term rehab (<3 hours/day) ?  ?  ?Assistance Recommended at Discharge Intermittent Supervision/Assistance  ?Patient can return home with the following A little help with walking and/or transfers;A little help with bathing/dressing/bathroom;Assist for transportation ?  ?Equipment Recommendations ? None recommended by PT  ?  ?Recommendations for Other Services   ? ? ?  ?Precautions / Restrictions  Precautions ?Precautions: Fall ?Restrictions ?Weight Bearing Restrictions: No  ?  ? ?Mobility ? Bed Mobility ?Overal bed mobility: Needs Assistance ?Bed Mobility: Supine to Sit, Sit to Supine ?  ?  ?Supine to sit: Supervision ?  ?  ?General bed mobility comments: Pt moves on her own with incr time. ?  ? ?Transfers ?Overall transfer level: Needs assistance ?Equipment used: Conservation officer, nature (2 wheels) Angelia Mould) ?Transfers: Sit to/from Stand ?Sit to Stand: Supervision, Min guard ?  ?  ?  ?  ?  ?General transfer comment: min guard for safety with pt not needing any assist today ?  ? ?Ambulation/Gait ?Ambulation/Gait assistance: Min guard, Supervision ?Gait Distance (Feet): 90 Feet ?Assistive device: Rolling walker (2 wheels) ?Gait Pattern/deviations: Step-through pattern, Decreased stride length, Wide base of support, Drifts right/left, Trunk flexed ?Gait velocity: decreased ?  ?  ?General Gait Details: Pt ambulated with bariatric RW and min guard assist for safety with overall steady gait but moves slowly and very deliberate with each step.  Did not need help steering RW.Pt HR 100-130 bpm with activity. SpO2 90% on 3L on arrival at rest.  O2 on RA at rest 88%. Desat as low as 86% once she started walking on RA therefore replaced O2 at 3L.  Pt was 93% on 3L with activity.  Reviewed incentive spirometer for pt and educated pt as to how to use it.On return to room, asked pt if she would like to bathe her private areas as her purewick had leaked. Pt was able to stand and clean her privates  prior to sitting in recliner. ? ? ?Stairs ?  ?  ?  ?  ?  ? ? ?Wheelchair Mobility ?  ? ?Modified Rankin (Stroke Patients Only) ?  ? ? ?  ?Balance Overall balance assessment: Needs assistance ?Sitting-balance support: No upper extremity supported, Feet supported ?Sitting balance-Leahy Scale: Fair ?  ?  ?Standing balance support: Bilateral upper extremity supported, During functional activity ?Standing balance-Leahy Scale: Poor ?Standing  balance comment: Pt was able to stand with RW with bil UE support and external support. ?  ?  ?  ?  ?  ?  ?  ?  ?  ?  ?  ?  ? ?  ?Cognition Arousal/Alertness: Awake/alert ?Behavior During Therapy: [facility] for tasks assessed/performed, Flat affect, Impulsive ?Overall Cognitive Status: No family/caregiver present to determine baseline cognitive functioning ?  ?  ?  ?  ?  ?  ?  ?  ?  ?  ?  ?  ?  ?  ?  ?  ?General Comments: Pt demonstrating flat affect and some impulsivity, stopping and resting with both arms during ambulation task.  Pt was not unsteady throughout session however. ?  ?  ? ?  ?Exercises General Exercises - Lower Extremity ?Long Arc Quad: AROM, Both, 10 reps, Seated ?Hip Flexion/Marching: AROM, Both, 5 reps, Seated ? ?  ?General Comments   ?  ?  ? ?Pertinent Vitals/Pain Pain Assessment ?Pain Assessment: No/denies pain  ? ? ?Home Living   ?  ?  ?  ?  ?  ?  ?  ?  ?  ?   ?  ?Prior Function    ?  ?  ?   ? ?PT Goals (current goals can now be found in the care plan section) Acute Rehab PT Goals ?Patient Stated Goal: to go home ?Progress towards PT goals: Progressing toward goals ? ?  ?Frequency ? ? ? Min 3X/week ? ? ? ?  ?PT Plan Current plan remains appropriate  ? ? ?Co-evaluation   ?  ?  ?  ?  ? ?  ?AM-PAC PT "6 Clicks" Mobility   ?Outcome Measure ? Help needed turning from your back to your side while in a flat bed without using bedrails?: None ?Help needed moving from lying on your back to sitting on the side of a flat bed without using bedrails?: None ?Help needed moving to and from a bed to a chair (including a wheelchair)?: A Little ?Help needed standing up from a chair using your arms (e.g., wheelchair or bedside chair)?: A Little ?Help needed to walk in hospital room?: A Little ?Help needed climbing 3-5 steps with a railing? : Total ?6 Click Score: 18 ? ?  ?End of Session Equipment Utilized During Treatment: Gait belt;Oxygen (3L via [place]) ?Activity Tolerance: Patient limited by fatigue ?Patient left: with  call bell/phone within reach;in chair;with chair alarm set ?Nurse Communication: Mobility status ?PT Visit Diagnosis: Unsteadiness on feet (R26.81);Muscle weakness (generalized) (M62.81) ?  ? ? ?Time: [id number] ?PT Time Calculation (min) (ACUTE ONLY): 28 min ? ?Charges:  $Gait Training: 8-22 mins ?$Self Care/Home Management: 8-22          ?          ? ?Felicia Warren,PT ?Acute Rehab Services ?[phone] ?[phone] (pager)  ? ? ?Alvira Philips ?06/24/2021, 11:35 AM ? ?

## 2021-06-25 NOTE — Progress Notes (Signed)
?PROGRESS NOTE ?Felicia Warren  [id number] DOB: [birth year] DOA: 06/15/2021 ?PCP: Elsie Stain, MD  ? ?Brief Narrative/Hospital Course: ?51 year old F with PMH of OSA/OHS on CPAP?,  HTN, morbid obesity and PE not on AC brought to ED by family members under IVC due to self-neglect and inability to get off the floor and refusing medical attention, and admitted for acute hypoxic respiratory failure.  Basic labs including CBC and CMP without significant finding.  CT head without acute finding.  Troponin negative x2.  BNP 200.  CTA chest negative for PE but body wall edema and signs of chronic pulmonary hypertension.  Echocardiogram ordered.  Psychiatry consulted due to self-neglect.  ? ?Patient was transferred to stepdown unit due to encephalopathy and respiratory failure with hypoxia requiring BiPAP.  ABG suggestive for chronic respiratory acidosis with hypercapnia.  TTE with LVEF of 60 to 65%, G2-DD and normal RVSF.  Respiratory status and mental status improved, and she was transferred to progressive care. ? ?Per psychiatry, patient has no psychiatric condition but IVC ineffective due to lack of capacity in the setting of encephalopathy/delirium.  Psychiatry signed off.  ? ?Therapy recommends SNF unless she has 24-hour care.  Patient prefers to go back to her extended stay but remains deconditioned and still does not seem to have medical decision-making capacity.  Psychiatry reconsulted at [facility] request to reevaluate capacity.  She is otherwise medically optimized.  Psychiatry reevaluated and noted patient capacity waxes and wanes-has generally lacked decisional capacity over multiple evaluations due to poor insight into medical condition, patient has good communication but no understanding,reasoning or rational decision making.  ?  ?Subjective: ? ?Seen and examined.Resting comfortably.No new complaints ?Overnight no fever,used BiPAP  ?BP stable in low 100.   ?  ?Assessment and Plan: ?Principal Problem: ?  Acute on  chronic respiratory failure with hypoxia and hypercapnia (HCC) ?Active Problems: ?  Acute respiratory failure with hypoxia ([place]) ?  Acute metabolic encephalopathy ?  Involuntary commitment ?  Chronic diastolic CHF (congestive heart failure) ([place]) ?  BMI 60.0-69.9, adult ([place]) ?  Self neglect ?  Physical deconditioning ?  Iron deficiency anemia ?  Hypokalemia and hypomagnesemia ?  History of pulmonary embolism ?  Hypokalemia ?  Prediabetes ?  OSA/OHS on CPAP ?  Hypomagnesemia ?  ?Acute on chronic respiratory failure with hypoxia and hypercapnia ?OSA/OHS on CPAP ?Presentation likely multifactorial suspected OSA/OHS, history of chronic diastolic CHF/right heart failure, ABG 7.39/71/77/43 suggesting chronic respiratory acidosis. CTA-no PE or infectious process but concerning body wall edema.  BNP 200, could be falsely low.Serial troponin negative.  Difficult to assess fluid status due to body habitus.  Noncompliant to torsemide, echo shows EF more G2 DD. ?Clinically improved, continue torsemide, continue BiPAP qhs/prn, cont PT OT ? ?Chronic diastolic CHF: TTELVEF of 60 to 65% and G2 DD.  Not grossly fluid overloaded but not compliant torsemide,.  Continue torsemide,monitor intake output daily weight along with salt fluid restriction and monitor renal function ?Net IO Since Admission: -10,346 mL [06/25/21 1143]  ?Filed Weights  ? 06/15/21 2318 06/19/21 0501  ?Weight: (!) 181.4 kg (!) 182.6 kg  ?  ? ?Involuntary commitment: IVC initially by family member/sister due to self-neglect and refusal to seek medical attention.  Evaluated by psychiatry and deemed to lack capacity but no psychiatric condition.  At some point, she threatened to leave AMA but not anymore.  Not a danger to self or someone else.-IVC rescinded.  Safety sitter discontinued. ? ?Acute metabolic encephalopathy ?Felt to be due  to hypercapnia-work-up with CT head and basic labs, UDS, TSH, ammonia  and B12 without significant finding.  Does not seem to be on  meds with risk for encephalopathy.  Low suspicion for infectious process.  She is awake and oriented x4 but with limited insight.  Deemed to lack medical decision-making capacity by psychiatry on admission.Psychiatry reevaluated 06/23/21 and noted patient's capacity waxes and wanes-has generally lacked decisional capacity over multiple evaluations due to poor insight into medical condition, patient has good communication but no understanding,reasoning or rational decision making.Continue pulmonary support, reorientation delirium precaution, ambulation.  Awaiting on placement. ? ?Hypokalemia and hypomagnesemia: Resolved ? ?Iron deficiency anemia: s/p IV ferric gluconate on 4/28, H&H stable.  Monitor ? ?Physical deconditioning/debility: ?PT/OT recommends SNF unless she has 24/7 care/supervision. ?TOC awaiting for insurance approval/BS air ? ?Self neglect-planning for placement ? ?Prediabetes:A1c 6.2%.  Needs diet control ? ?History of pulmonary embolism:Not on anticoagulation at home.  Refusing subcu Lovenox for VTE prophylaxis.Continue Xarelto 10 mg daily.   ? ?Morbid obesity:Patient's Body mass index is 69.1 kg/m?. : Will benefit with PCP follow-up, weight loss and healthy lifestyle ? ?DVT prophylaxis: rivaroxaban (XARELTO) tablet 10 mg Start: 06/19/21 1000 ?Code Status:   Code Status: Full Code ?Family Communication: plan of care discussed with patient at bedside. ?Patient status is: Inpatient because of unsafe disposition ?Level of care: Progressive  ? ?Dispo: The patient is from: Extended stay ?           Anticipated disposition: Skilled nursing facility once approved ? ?Mobility Assessment (last 72 hours)   ? ? Mobility Assessment   ? ? [place] Name 06/24/21 2020 06/24/21 1606 06/24/21 1100 06/23/21 0920  ?  ? Does patient have an order for bedrest or is patient medically unstable No - Continue assessment -- No - Continue assessment No - Continue assessment   ? What is the highest level of mobility based on the  progressive mobility assessment? Level 4 (Walks with assist in room) - Balance while marching in place and cannot step forward and back - Complete Level 5 (Walks with assist in room/hall) - Balance while stepping forward/back and can walk in room with assist - Complete Level 5 (Walks with assist in room/hall) - Balance while stepping forward/back and can walk in room with assist - Complete Level 5 (Walks with assist in room/hall) - Balance while stepping forward/back and can walk in room with assist - Complete   ? Is the above level different from baseline mobility prior to current illness? -- -- -- Yes - Recommend PT order   ? ?  ?  ? ?  ?  ? ?Objective: ?Vitals last 24 hrs: ?Vitals:  ? 06/24/21 1330 06/24/21 2031 06/24/21 2350 06/25/21 0544  ?BP: (!) 142/83 (!) 109/58  (!) 104/52  ?Pulse: 85 92  69  ?Resp: 20 18  18   ?Temp: 97.8 ?F (36.6 ?C) 97.7 ?F (36.5 ?C)  97.8 ?F (36.6 ?C)  ?TempSrc: Oral Oral  Oral  ?SpO2: 97% 93% 97% 99%  ?Weight:      ?Height:      ? ?Weight change:  ? ?Physical Examination: ?General exam: AA, obese, older than stated age, weak appearing. ?HEENT:Oral mucosa moist, Ear/Nose WNL grossly, dentition normal. ?Respiratory system: bilaterally diminished,no use of accessory muscle ?Cardiovascular system: S1 & S2 +, No JVD,. ?Gastrointestinal system: Abdomen soft,NT,ND, BS+ ?Nervous System:Alert, awake, moving extremities and grossly nonfocal ?Extremities: edema neg,distal peripheral pulses palpable.  ?Skin: No rashes,no icterus. ?MSK: Normal muscle bulk,tone, power ? ? ? ?  Medications reviewed:  ?Scheduled Meds: ? pantoprazole  40 mg Oral QHS  ? rivaroxaban  10 mg Oral Daily  ? sodium chloride flush  3 mL Intravenous Q12H  ? torsemide  20 mg Oral Daily  ? ?Continuous Infusions: ? sodium chloride    ? ? ?  ?Diet Order   ? ?       ?  Diet Heart Room service appropriate? Yes; Fluid consistency: Thin; Fluid restriction: 1500 mL Fluid  Diet effective now       ?  ? ?  ?  ? ?  ?  ? ?  ?  ?   ? ? ?Intake/Output Summary (Last 24 hours) at 06/25/2021 1143 ?Last data filed at 06/25/2021 0900 ?Gross per 24 hour  ?Intake 360 ml  ?Output 1800 ml  ?Net -1440 ml  ? ?Net IO Since Admission: -10,346 mL [06/25/21 1143]  ?W

## 2021-06-25 NOTE — Progress Notes (Signed)
Pt off BIPAP at this time.  

## 2021-06-26 MED ORDER — TORSEMIDE 20 MG PO TABS: 20.0000 mg | ORAL_TABLET | Freq: Every day | ORAL | Status: DC

## 2021-06-26 NOTE — Discharge Summary (Signed)
Physician Discharge Summary  ?Felicia Warren [id number] DOB: 05/08/1970 DOA: 06/15/2021 ? ?PCP: Elsie Stain, MD ? ?Admit date: 06/15/2021 ?Discharge date: 06/26/2021 ?Recommendations for Outpatient Follow-up:  ?Follow up with PCP in 1 weeks-call for appointment ?Please obtain BMP/CBC in one week ?Continue BiPAP bedtime and during nap time ? ?Discharge Dispo: SNF ?Discharge Condition: Stable ?Code Status:   Code Status: Full Code ?Diet recommendation:  ?Diet Order   ? ?       ?  Diet Heart Room service appropriate? Yes; Fluid consistency: Thin; Fluid restriction: 1500 mL Fluid  Diet effective now       ?  ? ?  ?  ? ?  ?Brief/Interim Summary: ?51 year old F with PMH of OSA/OHS on CPAP?,  HTN, morbid obesity and PE not on AC brought to ED by family members under IVC due to self-neglect and inability to get off the floor and refusing medical attention, and admitted for acute hypoxic respiratory failure.  Basic labs including CBC and CMP without significant finding.  CT head without acute finding.  Troponin negative x2.  BNP 200.  CTA chest negative for PE but body wall edema and signs of chronic pulmonary hypertension.  Echocardiogram ordered.  Psychiatry consulted due to self-neglect.  ? ?Patient was transferred to stepdown unit due to encephalopathy and respiratory failure with hypoxia requiring BiPAP.  ABG suggestive for chronic respiratory acidosis with hypercapnia.  TTE with LVEF of 60 to 65%, G2-DD and normal RVSF.  Respiratory status and mental status improved, and she was transferred to progressive care. ? ?Per psychiatry, patient has no psychiatric condition but IVC ineffective due to lack of capacity in the setting of encephalopathy/delirium.  Psychiatry signed off.  ? ?Therapy recommends SNF unless she has 24-hour care.  Patient prefers to go back to her extended stay but remains deconditioned and still does not seem to have medical decision-making capacity.  Psychiatry reconsulted at [facility] request to  reevaluate capacity.  She is otherwise medically optimized.  Psychiatry reevaluated and noted patient capacity waxes and wanes-has generally lacked decisional capacity over multiple evaluations due to poor insight into medical condition, patient has good communication but no understanding,reasoning or rational decision making. ?She is waiting for replacement, finally accepted and has a passer number and insurance approval and is being discharged to skilled nursing facility.  ? ?Discharge Diagnoses:  ?Principal Problem: ?  Acute on chronic respiratory failure with hypoxia and hypercapnia (HCC) ?Active Problems: ?  Acute respiratory failure with hypoxia ([place]) ?  Acute metabolic encephalopathy ?  Involuntary commitment ?  Chronic diastolic CHF (congestive heart failure) ([place]) ?  BMI 60.0-69.9, adult ([place]) ?  Self neglect ?  Physical deconditioning ?  Iron deficiency anemia ?  Hypokalemia and hypomagnesemia ?  History of pulmonary embolism ?  Hypokalemia ?  Prediabetes ?  OSA/OHS on CPAP ?  Hypomagnesemia ? ?Acute on chronic respiratory failure with hypoxia and hypercapnia ?OSA/OHS on CPAP bedtime ?Presentation likely multifactorial suspected OSA/OHS, history of chronic diastolic CHF/right heart failure, ABG 7.39/71/77/43 suggesting chronic respiratory acidosis. CTA-no PE or infectious process but concerning body wall edema.  BNP 200, could be falsely low.Serial troponin negative.  Difficult to assess fluid status due to body habitus.  Noncompliant to torsemide, echo shows EF more G2 DD.Clinically improved, continue torsemide, patient has CPAP already and she will continued with that at the facility-advised to follow-up with her primary pulmonologist/PCP. cont PT OT ?  ?Chronic diastolic CHF: TTELVEF of 60 to 65% and G2 DD.  Not  grossly fluid overloaded but not compliant torsemide,.  Continue torsemide 20 mg daily, monitor intake output daily weight along with salt fluid restriction and monitor renal function. Net IO  Since Admission: -12,186 mL [06/26/21 1038] . BMP in 1 week. ?    ?Filed Weights  ?  06/15/21 2318 06/19/21 0501  ?Weight: (!) 181.4 kg (!) 182.6 kg  ?  ?  ?Involuntary commitment: IVC initially by family member/sister due to self-neglect and refusal to seek medical attention.  Evaluated by psychiatry and deemed to lack capacity but no psychiatric condition.  At some point, she threatened to leave AMA but not anymore.  Not a danger to self or someone else.-IVC rescinded.  Safety sitter discontinued. ?  ?Acute metabolic encephalopathy ?Felt to be due to hypercapnia-work-up with CT head and basic labs, UDS, TSH, ammonia  and B12 without significant finding.  Does not seem to be on meds with risk for encephalopathy.  Low suspicion for infectious process.  She is awake and oriented x4 but with limited insight.  Deemed to lack medical decision-making capacity by psychiatry on admission.Psychiatry reevaluated 06/23/21 and noted patient's capacity waxes and wanes-has generally lacked decisional capacity over multiple evaluations due to poor insight into medical condition, patient has good communication but no understanding,reasoning or rational decision making.Continue pulmonary support, reorientation delirium precaution, ambulation.  Awaiting on placement. ?  ?Hypokalemia and hypomagnesemia: Resolved ?  ?Iron deficiency anemia: s/p IV ferric gluconate on 4/28, H&H stable.  Start iron supplement PO.Monitor ?  ?Physical deconditioning/debility: ?PT/OT recommends SNF unless she has 24/7 care/supervision. ?TOC awaiting for insurance approval/BS air ?  ?Self neglect-planning for placement ?  ?Prediabetes:A1c 6.2%.  Needs diet control ?  ?History of pulmonary embolism:Not on anticoagulation at home.  CT chest 06/16/2021 " Very limited CTA primarily due to body habitus and motion. No gross pulmonary embolism. There are signs of chronic pulmonary hypertension".Refusing subcu Lovenox for VTE prophylaxis. Getting prophylactic Xarelto  10 mg daily.  Follow-up with PCP. ?  ?Morbid obesity:Patient's Body mass index is 69.1 kg/m?. : Will benefit with PCP follow-up, weight loss and healthy lifestyle ? ?Consults: ?Psychiatry ?Subjective: ?Alert awake pleasant, not in distress,resting comfortably ? ?Discharge Exam: ?Vitals:  ? 06/25/21 2138 06/26/21 0534  ?BP: (!) 116/55 (!) 151/56  ?Pulse: 73 66  ?Resp: 16 16  ?Temp: 97.7 ?F (36.5 ?C) 98.2 ?F (36.8 ?C)  ?SpO2: 93% 100%  ? ?General: Pt is alert, awake, not in acute distress ?Cardiovascular: RRR, S1/S2 +, no rubs, no gallops ?Respiratory: CTA bilaterally, no wheezing, no rhonchi ?Abdominal: Soft, NT, ND, bowel sounds + ?Extremities: no edema, no cyanosis ? ?Discharge Instructions ? ?Discharge Instructions   ? ? (HEART FAILURE PATIENTS) Call MD:  Anytime you have any of the following symptoms: 1) 3 pound weight gain in 24 hours or 5 pounds in 1 week 2) shortness of breath, with or without a dry hacking cough 3) swelling in the hands, feet or stomach 4) if you have to sleep on extra pillows at night in order to breathe.   Complete by: As directed ?  ? Discharge instructions   Complete by: As directed ?  ? Continue CPAP bedtime and during nap time, check BMP CBC in 1 week. ? ?Please call call MD or return to ER for similar or worsening recurring problem that brought you to hospital or if any fever,nausea/vomiting,abdominal pain, uncontrolled pain, chest pain,  shortness of breath or any other alarming symptoms. ? ?Please follow-up your doctor as instructed in a  week time and call the office for appointment. ? ?Please avoid alcohol, smoking, or any other illicit substance and maintain healthy habits including taking your regular medications as prescribed. ? ?You were cared for by a hospitalist during your hospital stay. If you have any questions about your discharge medications or the care you received while you were in the hospital after you are discharged, you can call the unit and ask to speak with the  hospitalist on call if the hospitalist that took care of you is not available. ? ?Once you are discharged, your primary care physician will handle any further medical issues. Please note that NO REFILLS for any

## 2021-06-26 NOTE — [place] FL2 (Signed)
?[place] MEDICAID FL2 LEVEL OF CARE SCREENING TOOL  ?  ? ?IDENTIFICATION  ?Patient Name: ?Felicia Warren Birthdate: 02-12-1971 Sex: female Admission Date (Current Location): ?06/15/2021  ?[place] Number: ? [place] ?  Facility and Address:  ?[facility],  [address], [place] [address] ?     Provider Number: ?[id number]  ?Attending Physician Name and Address:  ?Lanae Boast, MD ? Relative Name and Phone Number:  ?Migdalia Dk sister [phone] ?   ?Current Level of Care: ?Hospital Recommended Level of Care: ?Skilled Nursing Facility Prior Approval Number: ?  ? ?Date Approved/Denied: ?  PASRR Number: ? ([id number]) ? ?Discharge Plan: ?SNF ?  ? ?Current Diagnoses: ?Patient Active Problem List  ? Diagnosis Date Noted  ? Hypokalemia and hypomagnesemia 06/20/2021  ? Hypomagnesemia 06/20/2021  ? Physical deconditioning 06/18/2021  ? Iron deficiency anemia 06/18/2021  ? Chronic diastolic CHF (congestive heart failure) (HCC) 06/18/2021  ? Involuntary commitment 06/17/2021  ? Self neglect 06/17/2021  ? OSA/OHS on CPAP 06/17/2021  ? Acute respiratory failure with hypoxia (HCC) 06/16/2021  ? Acute on chronic respiratory failure with hypoxia and hypercapnia (HCC) 06/16/2021  ? BMI 60.0-69.9, adult (HCC) 09/17/2020  ? Prediabetes 09/17/2020  ? Hypokalemia   ? Acute metabolic encephalopathy 07/05/2020  ? History of pulmonary embolism 07/05/2020  ? ? ?Orientation RESPIRATION BLADDER Height & Weight   ?  ?Self, Time, Situation, Place ? O2 Continent Weight:  (bed doesn't work for measuring wt) ?Height:  5\' 4"  (162.6 cm)  ?BEHAVIORAL SYMPTOMS/MOOD NEUROLOGICAL BOWEL NUTRITION STATUS  ?    Continent Diet (1500 fluid restriction)  ?AMBULATORY STATUS COMMUNICATION OF NEEDS Skin   ?Limited Assist Verbally Normal ?  ?  ?  ?    ?     ?     ? ? ?Personal Care Assistance Level of Assistance  ?Bathing, Feeding, Dressing Bathing Assistance: Limited assistance ?Feeding assistance: Limited assistance ?Dressing  Assistance: Limited assistance ?   ? ?Functional Limitations Info  ?Sight, Hearing, Speech Sight Info: Adequate ?Hearing Info: Adequate ?Speech Info: Adequate  ? ? ?SPECIAL CARE FACTORS FREQUENCY  ?PT (By licensed PT), OT (By licensed OT)   ?  ?PT Frequency:  (5x week) ?OT Frequency:  (5x week) ?  ?  ?  ?   ? ? ?Contractures Contractures Info: Not present  ? ? ?Additional Factors Info  ?Code Status, Allergies Code Status Info:  (Full) ?Allergies Info:  (NKA) ?  ?  ?  ?   ? ?Current Medications (06/26/2021):  This is the current hospital active medication list ?Current Facility-Administered Medications  ?Medication Dose Route Frequency Provider Last Rate Last Admin  ? 0.9 %  sodium chloride infusion  250 mL Intravenous PRN 08/26/2021, MD      ? acetaminophen (TYLENOL) tablet 650 mg  650 mg Oral Q6H PRN Almon Hercules, MD      ? Or  ? acetaminophen (TYLENOL) suppository 650 mg  650 mg Rectal Q6H PRN Gonfa, Taye T, MD      ? ondansetron (ZOFRAN) tablet 4 mg  4 mg Oral Q6H PRN Gonfa, Taye T, MD      ? Or  ? ondansetron (ZOFRAN) injection 4 mg  4 mg Intravenous Q6H PRN Gonfa, Taye T, MD      ? pantoprazole (PROTONIX) EC tablet 40 mg  40 mg Oral QHS Almon Hercules T, MD   40 mg at 06/25/21 2159  ? rivaroxaban (XARELTO) tablet 10 mg  10 mg  Oral Daily Lynden Ang, RPH   10 mg at 06/25/21 0915  ? sodium chloride flush (NS) 0.9 % injection 3 mL  3 mL Intravenous Q12H Candelaria Stagers T, MD   3 mL at 06/25/21 0915  ? sodium chloride flush (NS) 0.9 % injection 3 mL  3 mL Intravenous PRN Candelaria Stagers T, MD      ? torsemide (DEMADEX) tablet 20 mg  20 mg Oral Daily Candelaria Stagers T, MD   20 mg at 06/25/21 0915  ? ? ? ?Discharge Medications: ?Please see discharge summary for a list of discharge medications. ? ?Relevant Imaging Results: ? ?Relevant Lab Results: ? ? ?Additional Information ? ([id number]) ? ?Lanier Clam, RN ? ? ? ? ?

## 2021-06-26 NOTE — Progress Notes (Signed)
PTAR arrived to pick pt for discharge to [facility]; IV removed, pt belongings gathered, and pt assisted to stretcher at this time. Facility called to notify that pt on her way, no answer received. Pt's sister Arline Asp called and notified that pt on her way to facility, Ms. [name] verbalized understanding.  ?

## 2021-06-26 NOTE — TOC Transition Note (Addendum)
Transition of Care (TOC) - CM/SW Discharge Note ? ? ?Patient Details  ?Name: Felicia Warren ?MRN: [id number] ?Date of Birth: 24-Nov-1970 ? ?Transition of Care [facility]) CM/SW Contact:  ?Dessa Phi, RN ?Phone Number: ?06/26/2021, 9:38 AM ? ? ?Clinical Narrative: d/c today-received pasrr, & auth-CP rep Shirlee Limerick can accept today. Await d/c summary.   ?-10:42a-[name] sister to sing n-she will come to hospital by 2p. Will schedule 3p PTAR. ?-PTAR scheduled for 3p pick up.going to rm#[phone],tel#[phone]. No further CM needs. ? ? ? ?Final next level of care: [place] ?Barriers to Discharge: No Barriers Identified ? ? ?Patient Goals and CMS Choice ?Patient states their goals for this hospitalization and ongoing recovery are:: Rehab ?CMS Medicare.gov Compare Post Acute Care list provided to:: Patient ?Choice offered to / list presented to : NA ? ?Discharge Placement ?  ?           ?  ?  ?  ?  ? ?Discharge Plan and Services ?In-house Referral: Clinical Social Work ?  ?Post Acute Care Choice: NA          ?DME Arranged: N/A ?DME Agency: NA ?  ?  ?  ?  ?  ?  ?  ?  ? ?Social Determinants of Health (SDOH) Interventions ?  ? ? ?Readmission Risk Interventions ?   ? View : No data to display.  ?  ?  ?  ? ? ? ? ? ?

## 2021-06-26 NOTE — Progress Notes (Signed)
[facility] called to ensure facility would still be able to accept pt at night and if there was a cutoff time that they stop taking admissions for the day; Spoke to staff member Hakeem who states they are able to take new patients until midnight, and if pt arrives past midnight they will wait until next morning.  ?

## 2021-10-19 ENCOUNTER — Inpatient Hospital Stay (HOSPITAL_COMMUNITY): Payer: Medicaid Other

## 2021-10-19 ENCOUNTER — Inpatient Hospital Stay (HOSPITAL_COMMUNITY)
Admission: EM | Admit: 2021-10-19 | Discharge: 2021-11-27 | DRG: 208 | Disposition: A | Discharge status: Home / Self Care | Payer: Medicaid Other | Attending: Internal Medicine | Admitting: Internal Medicine

## 2021-10-19 ENCOUNTER — Encounter (HOSPITAL_COMMUNITY): Payer: Self-pay | Admitting: Emergency Medicine

## 2021-10-19 ENCOUNTER — Other Ambulatory Visit: Payer: Self-pay

## 2021-10-19 ENCOUNTER — Emergency Department (HOSPITAL_COMMUNITY): Payer: Medicaid Other

## 2021-10-19 DIAGNOSIS — Z1152 Encounter for screening for COVID-19: Secondary | ICD-10-CM | POA: Diagnosis not present

## 2021-10-19 DIAGNOSIS — E878 Other disorders of electrolyte and fluid balance, not elsewhere classified: Secondary | ICD-10-CM | POA: Diagnosis present

## 2021-10-19 DIAGNOSIS — J9622 Acute and chronic respiratory failure with hypercapnia: Principal | ICD-10-CM | POA: Diagnosis present

## 2021-10-19 DIAGNOSIS — J9621 Acute and chronic respiratory failure with hypoxia: Secondary | ICD-10-CM | POA: Diagnosis present

## 2021-10-19 DIAGNOSIS — K7200 Acute and subacute hepatic failure without coma: Secondary | ICD-10-CM | POA: Diagnosis present

## 2021-10-19 DIAGNOSIS — R4689 Other symptoms and signs involving appearance and behavior: Secondary | ICD-10-CM | POA: Diagnosis not present

## 2021-10-19 DIAGNOSIS — R34 Anuria and oliguria: Secondary | ICD-10-CM | POA: Diagnosis present

## 2021-10-19 DIAGNOSIS — Z91199 Patient's noncompliance with other medical treatment and regimen due to unspecified reason: Secondary | ICD-10-CM

## 2021-10-19 DIAGNOSIS — Z86711 Personal history of pulmonary embolism: Secondary | ICD-10-CM | POA: Diagnosis present

## 2021-10-19 DIAGNOSIS — E662 Morbid (severe) obesity with alveolar hypoventilation: Secondary | ICD-10-CM

## 2021-10-19 DIAGNOSIS — E8729 Other acidosis: Secondary | ICD-10-CM | POA: Diagnosis present

## 2021-10-19 DIAGNOSIS — R627 Adult failure to thrive: Secondary | ICD-10-CM | POA: Diagnosis present

## 2021-10-19 DIAGNOSIS — N390 Urinary tract infection, site not specified: Secondary | ICD-10-CM | POA: Diagnosis present

## 2021-10-19 DIAGNOSIS — E876 Hypokalemia: Secondary | ICD-10-CM | POA: Diagnosis not present

## 2021-10-19 DIAGNOSIS — Z79899 Other long term (current) drug therapy: Secondary | ICD-10-CM

## 2021-10-19 DIAGNOSIS — J441 Chronic obstructive pulmonary disease with (acute) exacerbation: Secondary | ICD-10-CM | POA: Diagnosis present

## 2021-10-19 DIAGNOSIS — Z6841 Body Mass Index (BMI) 40.0 and over, adult: Secondary | ICD-10-CM

## 2021-10-19 DIAGNOSIS — Z5901 Sheltered homelessness: Secondary | ICD-10-CM

## 2021-10-19 DIAGNOSIS — N179 Acute kidney failure, unspecified: Secondary | ICD-10-CM | POA: Diagnosis present

## 2021-10-19 DIAGNOSIS — I5033 Acute on chronic diastolic (congestive) heart failure: Secondary | ICD-10-CM | POA: Diagnosis not present

## 2021-10-19 DIAGNOSIS — E119 Type 2 diabetes mellitus without complications: Secondary | ICD-10-CM

## 2021-10-19 DIAGNOSIS — I2782 Chronic pulmonary embolism: Secondary | ICD-10-CM | POA: Diagnosis not present

## 2021-10-19 DIAGNOSIS — K8020 Calculus of gallbladder without cholecystitis without obstruction: Secondary | ICD-10-CM | POA: Diagnosis present

## 2021-10-19 DIAGNOSIS — D649 Anemia, unspecified: Secondary | ICD-10-CM | POA: Diagnosis present

## 2021-10-19 DIAGNOSIS — Z741 Need for assistance with personal care: Secondary | ICD-10-CM | POA: Diagnosis present

## 2021-10-19 DIAGNOSIS — R4189 Other symptoms and signs involving cognitive functions and awareness: Secondary | ICD-10-CM | POA: Diagnosis present

## 2021-10-19 DIAGNOSIS — I2699 Other pulmonary embolism without acute cor pulmonale: Secondary | ICD-10-CM | POA: Diagnosis present

## 2021-10-19 DIAGNOSIS — K760 Fatty (change of) liver, not elsewhere classified: Secondary | ICD-10-CM | POA: Diagnosis present

## 2021-10-19 DIAGNOSIS — J9602 Acute respiratory failure with hypercapnia: Principal | ICD-10-CM

## 2021-10-19 DIAGNOSIS — E66813 Obesity, class 3: Secondary | ICD-10-CM | POA: Diagnosis present

## 2021-10-19 DIAGNOSIS — R7989 Other specified abnormal findings of blood chemistry: Secondary | ICD-10-CM | POA: Diagnosis present

## 2021-10-19 LAB — BLOOD GAS, VENOUS
Acid-Base Excess: 4.7 mmol/L — ABNORMAL HIGH (ref 0.0–2.0)
Bicarbonate: 35.2 mmol/L — ABNORMAL HIGH (ref 20.0–28.0)
O2 Saturation: 73.8 %
Patient temperature: 37
pCO2, Ven: 84 mmHg (ref 44–60)
pH, Ven: 7.23 — ABNORMAL LOW (ref 7.25–7.43)
pO2, Ven: 53 mmHg — ABNORMAL HIGH (ref 32–45)

## 2021-10-19 LAB — URINALYSIS, ROUTINE W REFLEX MICROSCOPIC
Bilirubin Urine: NEGATIVE
Glucose, UA: NEGATIVE mg/dL
Ketones, ur: NEGATIVE mg/dL
Leukocytes,Ua: NEGATIVE
Nitrite: NEGATIVE
Protein, ur: 100 mg/dL — AB
Specific Gravity, Urine: 1.014 (ref 1.005–1.030)
pH: 5 (ref 5.0–8.0)

## 2021-10-19 LAB — BLOOD GAS, ARTERIAL
Acid-Base Excess: 2.7 mmol/L — ABNORMAL HIGH (ref 0.0–2.0)
Bicarbonate: 33.6 mmol/L — ABNORMAL HIGH (ref 20.0–28.0)
Drawn by: 56037
Expiratory PAP: 10 cmH2O
Inspiratory PAP: 20 cmH2O
O2 Saturation: 94.1 %
Patient temperature: 36.4
pCO2 arterial: 84 mmHg (ref 32–48)
pH, Arterial: 7.21 — ABNORMAL LOW (ref 7.35–7.45)
pO2, Arterial: 78 mmHg — ABNORMAL LOW (ref 83–108)

## 2021-10-19 LAB — CBC WITH DIFFERENTIAL/PLATELET
Abs Immature Granulocytes: 0.13 10*3/uL — ABNORMAL HIGH (ref 0.00–0.07)
Basophils Absolute: 0 10*3/uL (ref 0.0–0.1)
Basophils Relative: 0 %
Eosinophils Absolute: 0 10*3/uL (ref 0.0–0.5)
Eosinophils Relative: 0 %
HCT: 48.5 % — ABNORMAL HIGH (ref 36.0–46.0)
Hemoglobin: 12.8 g/dL (ref 12.0–15.0)
Immature Granulocytes: 1 %
Lymphocytes Relative: 18 %
Lymphs Abs: 1.8 10*3/uL (ref 0.7–4.0)
MCH: 22.3 pg — ABNORMAL LOW (ref 26.0–34.0)
MCHC: 26.4 g/dL — ABNORMAL LOW (ref 30.0–36.0)
MCV: 84.3 fL (ref 80.0–100.0)
Monocytes Absolute: 1 10*3/uL (ref 0.1–1.0)
Monocytes Relative: 10 %
Neutro Abs: 6.8 10*3/uL (ref 1.7–7.7)
Neutrophils Relative %: 71 %
Platelets: 308 10*3/uL (ref 150–400)
RBC: 5.75 MIL/uL — ABNORMAL HIGH (ref 3.87–5.11)
RDW: 21.7 % — ABNORMAL HIGH (ref 11.5–15.5)
WBC: 9.7 10*3/uL (ref 4.0–10.5)
nRBC: 0.3 % — ABNORMAL HIGH (ref 0.0–0.2)

## 2021-10-19 LAB — COMPREHENSIVE METABOLIC PANEL
ALT: 65 U/L — ABNORMAL HIGH (ref 0–44)
AST: 127 U/L — ABNORMAL HIGH (ref 15–41)
Albumin: 4 g/dL (ref 3.5–5.0)
Alkaline Phosphatase: 80 U/L (ref 38–126)
Anion gap: 14 (ref 5–15)
BUN: 27 mg/dL — ABNORMAL HIGH (ref 6–20)
CO2: 30 mmol/L (ref 22–32)
Calcium: 8.5 mg/dL — ABNORMAL LOW (ref 8.9–10.3)
Chloride: 102 mmol/L (ref 98–111)
Creatinine, Ser: 2.38 mg/dL — ABNORMAL HIGH (ref 0.44–1.00)
GFR, Estimated: 24 mL/min — ABNORMAL LOW (ref >60)
Glucose, Bld: 100 mg/dL — ABNORMAL HIGH (ref 70–99)
Potassium: 4.2 mmol/L (ref 3.5–5.1)
Sodium: 146 mmol/L — ABNORMAL HIGH (ref 135–145)
Total Bilirubin: 4.3 mg/dL — ABNORMAL HIGH (ref 0.3–1.2)
Total Protein: 7.8 g/dL (ref 6.5–8.1)

## 2021-10-19 LAB — RAPID URINE DRUG SCREEN, HOSP PERFORMED
Amphetamines: NOT DETECTED
Barbiturates: NOT DETECTED
Benzodiazepines: NOT DETECTED
Cocaine: NOT DETECTED
Opiates: NOT DETECTED
Tetrahydrocannabinol: NOT DETECTED

## 2021-10-19 LAB — BRAIN NATRIURETIC PEPTIDE: B Natriuretic Peptide: 778.8 pg/mL — ABNORMAL HIGH (ref 0.0–100.0)

## 2021-10-19 LAB — TROPONIN I (HIGH SENSITIVITY)
Troponin I (High Sensitivity): 199 ng/L (ref <18)
Troponin I (High Sensitivity): 209 ng/L (ref <18)

## 2021-10-19 LAB — CBG MONITORING, ED: Glucose-Capillary: 90 mg/dL (ref 70–99)

## 2021-10-19 LAB — LACTIC ACID, PLASMA
Lactic Acid, Venous: 2.2 mmol/L (ref 0.5–1.9)
Lactic Acid, Venous: 1.6 mmol/L (ref 0.5–1.9)

## 2021-10-19 LAB — AMMONIA: Ammonia: 23 umol/L (ref 9–35)

## 2021-10-19 LAB — SARS CORONAVIRUS 2 BY RT PCR: SARS Coronavirus 2 by RT PCR: NEGATIVE

## 2021-10-19 MED ORDER — SODIUM CHLORIDE 0.9 % IV SOLN
1.0000 g | INTRAVENOUS | Status: DC
  Administered 2021-10-20 – 2021-10-21 (×2): 1 g via INTRAVENOUS
  Filled 2021-10-19 (×2): qty 10

## 2021-10-19 MED ORDER — POLYETHYLENE GLYCOL 3350 17 G PO PACK: 17.0000 g | PACK | Freq: Every day | ORAL | Status: DC | PRN

## 2021-10-19 MED ORDER — SODIUM CHLORIDE 0.9 % IV SOLN
2.0000 g | Freq: Once | INTRAVENOUS | Status: AC
  Administered 2021-10-19: 2 g via INTRAVENOUS
  Filled 2021-10-19: qty 20

## 2021-10-19 MED ORDER — DOCUSATE SODIUM 100 MG PO CAPS: 100.0000 mg | ORAL_CAPSULE | Freq: Two times a day (BID) | ORAL | Status: DC | PRN

## 2021-10-19 MED ORDER — SODIUM CHLORIDE 0.9 % IV BOLUS
1000.0000 mL | Freq: Once | INTRAVENOUS | Status: AC
  Administered 2021-10-19: 1000 mL via INTRAVENOUS

## 2021-10-19 MED ORDER — SODIUM CHLORIDE 0.9 % IV SOLN: INTRAVENOUS | Status: DC

## 2021-10-19 MED ORDER — METHYLPREDNISOLONE SODIUM SUCC 125 MG IJ SOLR
125.0000 mg | Freq: Once | INTRAMUSCULAR | Status: AC
  Administered 2021-10-19: 125 mg via INTRAVENOUS
  Filled 2021-10-19: qty 2

## 2021-10-19 MED ORDER — HEPARIN SODIUM (PORCINE) 5000 UNIT/ML IJ SOLN
5000.0000 [IU] | Freq: Three times a day (TID) | INTRAMUSCULAR | Status: DC
  Administered 2021-10-19 – 2021-10-23 (×11): 5000 [IU] via SUBCUTANEOUS
  Filled 2021-10-19 (×11): qty 1

## 2021-10-19 NOTE — Progress Notes (Signed)
Rt placed pt on BIPAP for AMS/ high CO2 on VBG.

## 2021-10-19 NOTE — Progress Notes (Signed)
eLink Physician-Brief Progress Note Patient Name: Anelia Mair DOB: 1970/10/27 MRN: [id number]   Date of Service  10/19/2021  HPI/Events of Note  Notified of critical ABG results on BIPAP 20/10, 40% FiO2 -- 7.21, pCO2 84, pO2 84 <-- 7.23/84/53.  eICU Interventions  BIPAP changed to 22/8, FIO2 increased to 60%.  Repeat ABG in 1 hour.      Intervention Category Major Interventions: Respiratory failure - evaluation and management  [name] 10/19/2021, 10:49 PM

## 2021-10-19 NOTE — ED Notes (Signed)
Attempted to contact MD to give critical value. MD will call back.

## 2021-10-19 NOTE — Progress Notes (Signed)
Spoke with emergency contact Ms. Mickle Mallory, sister.   D/w her critical illness with mosf and potential deterioration resulting in need for ett, life support +/- acls. She expressed understanding. Pt has adult children or spouse but numerous other siblings. Mrs Mickle Mallory will contact them to discuss pt's wishes. She has not been well for some time and non adherent to medical recommendations.   At this time pt will remain full code until family comes to consensus, unfortunately pt is unable to participate in decision making at this time.

## 2021-10-19 NOTE — H&P (Signed)
NAME:  Felicia Warren, MRN:  [id number], DOB:  [birth year], LOS: 0 ADMISSION DATE:  10/19/2021, CONSULTATION DATE:  10/19/21 REFERRING MD:  EDP, CHIEF COMPLAINT:  acute hypercarbic resp failure   History of Present Illness:  51 yo female with pmh dm2, hfpEF, chronic osa non adherent with home niv. Pt has  been living in [address] with failure to thrive for approximately 3 weeks per EDP report. Family, sister, specifically endorses challenging environment with private pt and difficulty convincing her to seek medical attention. She states pt has been relatively recently discharged from rehab facility (pt's sister states she was not ready for discharge and is unsure how she was released). Per sister report and edp, as pt is unable provide any history, is that she was residing in a motel [address] and was about to be evicted as she was unable to remove herself from room for staff to clean the room and also unable to pay her bill.   Pt called her sister on Saturday and was not making sense per sister. She was stating she needed money but also confused about time of day and stating someone was trying to take her out. Subsequently, pt's sister sent money to hotel for payment of hotel room bill and receivedd a call from staff at hotel today stating that pt would be escorted out because she had been unable to leave the room for days and was urinating and defecating on herself in addition to not paying fees so they needed to remove her and clean the premises. At that time sister called police to perform welfare check and found pt confused in room in feces and urine.   Upon arrival to ed she was able to speak but drowsy, she was found to have a vbg with co2 in 80's in arf with liver injury and elevated bnp/trop with known hfpef. She was placed on niv and repeat vbg/abg is pending. Pt sister states that she has been unable to care for herself for some time and would like placement for her. She also states that they have never had a  conversation inregards to code status, but would like to converse with her other siblings to determine pt's potentially previously expressed wishes.   Pertinent  Medical History  HFpEF Morbid obesity PE Normocytic anemia Osa non adherent to bipap at home.    Significant Hospital Events: Including procedures, antibiotic start and stop dates in addition to other pertinent events   8/28: admitted to ICU 2/2 resp failure  Interim History / Subjective:    Objective   Blood pressure (!) 148/82, pulse 83, temperature (!) 97.5 F (36.4 C), resp. rate (!) 22, SpO2 95 %.        Intake/Output Summary (Last 24 hours) at 10/19/2021 2126 Last data filed at 10/19/2021 2117 Gross per 24 hour  Intake 1000 ml  Output --  Net 1000 ml   There were no vitals filed for this visit.  Examination: General: arousable with painful or verbal stim but drifts back to sleep, removes noxious stimuli and purposeful in all 4 HENT: ncat, eomi, perrla, sclera injected but anicteric  Lungs: diminished and distant bilaterally Cardiovascular: distant but rrr on tele Abdomen: obese, bs distant, non tender non distended Extremities: muscle wasting + edema, no clubbing/cyanosis Neuro: arousable as noted above but not following commands GU: deferred  Resolved Hospital Problem list     Assessment & Plan:  Acute on chronic hypercarbic resp failure:  -on niv -no acute indication for  intubation at this time -await repeat abg -high risk for need for intubation and d.w family awaiting response should this become necessary -cxr in am  -duonebs as needed -steroids -echo pending -check rvp and covid for completeness sake.  Aecopd:  -suspected. Do hear faint wheeze on exam -steroids, nebs as above H/o pe:  -cont a/c  Osa:  -will need better adherence with NIV.   Hfpef:  -repeat echo in am  -with acute renal failure suspect troponin elevation is 2/2 this -certainly resp failure could also be contributed  by exacerbation of hf however difficult to auscultate lung sounds to hear rales   Arf:  -baseline Cr 0.7 -current >2 -monitor indices -monitor I/o - will give gentle fluids at this time despite bnp as fio2 requirement is only 30-40% -certainly pending echo can change plan Lactic acidosis:  -improved with volume  Suspected uti:  -based on ua -will dose with rocephin that was started in ed -will send urine for cx  Best Practice (right click and "Reselect all SmartList Selections" daily)   Diet/type: NPO DVT prophylaxis: SCD GI prophylaxis: N/A Lines: N/A Foley:  N/A Code Status:  full code Last date of multidisciplinary goals of care discussion [with sister Mrs, [name], awaiting call back after speaking with siblings. ]  Labs   CBC: Recent Labs  Lab 10/19/21 1836  WBC 9.7  NEUTROABS 6.8  HGB 12.8  HCT 48.5*  MCV 84.3  PLT 308    Basic Metabolic Panel: Recent Labs  Lab 10/19/21 1836  NA 146*  K 4.2  CL 102  CO2 30  GLUCOSE 100*  BUN 27*  CREATININE 2.38*  CALCIUM 8.5*   GFR: CrCl cannot be calculated (Unknown ideal weight.). Recent Labs  Lab 10/19/21 1836 10/19/21 1837 10/19/21 2040  WBC 9.7  --   --   LATICACIDVEN  --  2.2* 1.6    Liver Function Tests: Recent Labs  Lab 10/19/21 1836  AST 127*  ALT 65*  ALKPHOS 80  BILITOT 4.3*  PROT 7.8  ALBUMIN 4.0   No results for input(s): "LIPASE", "AMYLASE" in the last 168 hours. Recent Labs  Lab 10/19/21 1838  AMMONIA 23    ABG    Component Value Date/Time   PHART 7.39 06/16/2021 2010   PCO2ART 71 (HH) 06/16/2021 2010   PO2ART 77 (L) 06/16/2021 2010   HCO3 35.2 (H) 10/19/2021 1835   TCO2 35 (H) 06/25/2020 2043   O2SAT 73.8 10/19/2021 1835     Coagulation Profile: No results for input(s): "INR", "PROTIME" in the last 168 hours.  Cardiac Enzymes: No results for input(s): "CKTOTAL", "CKMB", "CKMBINDEX", "TROPONINI" in the last 168 hours.  HbA1C: Hgb A1c MFr Bld  Date/Time Value Ref  Range Status  06/18/2021 04:29 AM 6.2 (H) 4.8 - 5.6 % Final    Comment:    (NOTE) Pre diabetes:          5.7%-6.4%  Diabetes:              >6.4%  Glycemic control for   <7.0% adults with diabetes   06/26/2020 03:20 AM 6.3 (H) 4.8 - 5.6 % Final    Comment:    (NOTE) Pre diabetes:          5.7%-6.4%  Diabetes:              >6.4%  Glycemic control for   <7.0% adults with diabetes     CBG: Recent Labs  Lab 10/19/21 1658  GLUCAP 90  Review of Systems:   As above, unobtainable 2/2 mental status  Past Medical History:  She,  has a past medical history of Acute respiratory failure with hypoxia and hypercarbia (HCC), AKI (acute kidney injury) (HCC), Community acquired pneumonia, Demand ischemia (HCC), Diarrhea, Hypercapnic respiratory failure (HCC) (06/25/2020), Hypoglycemia, Hypokalemia, Morbid obesity (HCC), Normocytic anemia, OSA treated with BiPAP, Pulmonary embolism (HCC), and Transaminitis.   Surgical History:  History reviewed. No pertinent surgical history.   Social History:   reports that she has never smoked. She has never used smokeless tobacco. She reports that she does not currently use alcohol. She reports that she does not currently use drugs.   Family History:  Her family history is not on file.   Allergies No Known Allergies   Home Medications  Prior to Admission medications   Medication Sig Start Date End Date Taking? Authorizing Provider  Multiple Vitamin (MULTIVITAMIN WITH MINERALS) TABS tablet Take 1 tablet by mouth daily. 07/26/20   Pennie Banter, DO  pantoprazole (PROTONIX) 40 MG tablet Take 1 tablet (40 mg total) by mouth at bedtime. 07/25/20   Pennie Banter, DO  torsemide (DEMADEX) 20 MG tablet Take 1 tablet (20 mg total) by mouth daily. 06/26/21   Lanae Boast, MD     Critical care time: 49 min excluding procedures. From 2100-0000

## 2021-10-19 NOTE — ED Provider Notes (Signed)
[facility]-EMERGENCY DEPT Provider Note   CSN: [id number] Arrival date & time: 10/19/21  1618     History {Add pertinent medical, surgical, social history, OB history to HPI:1} Chief Complaint  Patient presents with   Altered Mental Status    Felicia Warren is a 51 y.o. female.  Patient with history of obesity and history of respiratory failure secondary to obstructive sleep apnea and obesity hypoventilation syndrome.  She was found altered living in her hotel room   Altered Mental Status      Home Medications Prior to Admission medications   Medication Sig Start Date End Date Taking? Authorizing Provider  Multiple Vitamin (MULTIVITAMIN WITH MINERALS) TABS tablet Take 1 tablet by mouth daily. 07/26/20   Pennie Banter, DO  pantoprazole (PROTONIX) 40 MG tablet Take 1 tablet (40 mg total) by mouth at bedtime. 07/25/20   Pennie Banter, DO  torsemide (DEMADEX) 20 MG tablet Take 1 tablet (20 mg total) by mouth daily. 06/26/21   Lanae Boast, MD      Allergies    Patient has no known allergies.    Review of Systems   Review of Systems  Physical Exam Updated Vital Signs BP (!) 148/82   Pulse 83   Temp (!) 97.5 F (36.4 C)   Resp (!) 22   SpO2 95%  Physical Exam  ED Results / Procedures / Treatments   Labs (all labs ordered are listed, but only abnormal results are displayed) Labs Reviewed  CBC WITH DIFFERENTIAL/PLATELET - Abnormal; Notable for the following components:      Result Value   RBC 5.75 (*)    HCT 48.5 (*)    MCH 22.3 (*)    MCHC 26.4 (*)    RDW 21.7 (*)    nRBC 0.3 (*)    Abs Immature Granulocytes 0.13 (*)    All other components within normal limits  COMPREHENSIVE METABOLIC PANEL - Abnormal; Notable for the following components:   Sodium 146 (*)    Glucose, Bld 100 (*)    BUN 27 (*)    Creatinine, Ser 2.38 (*)    Calcium 8.5 (*)    AST 127 (*)    ALT 65 (*)    Total Bilirubin 4.3 (*)    GFR, Estimated 24 (*)    All  other components within normal limits  BLOOD GAS, VENOUS - Abnormal; Notable for the following components:   pH, Ven 7.23 (*)    pCO2, Ven 84 (*)    pO2, Ven 53 (*)    Bicarbonate 35.2 (*)    Acid-Base Excess 4.7 (*)    All other components within normal limits  BRAIN NATRIURETIC PEPTIDE - Abnormal; Notable for the following components:   B Natriuretic Peptide 778.8 (*)    All other components within normal limits  URINALYSIS, ROUTINE W REFLEX MICROSCOPIC - Abnormal; Notable for the following components:   Color, Urine AMBER (*)    APPearance CLOUDY (*)    Hgb urine dipstick SMALL (*)    Protein, ur 100 (*)    Bacteria, UA RARE (*)    All other components within normal limits  LACTIC ACID, PLASMA - Abnormal; Notable for the following components:   Lactic Acid, Venous 2.2 (*)    All other components within normal limits  TROPONIN I (HIGH SENSITIVITY) - Abnormal; Notable for the following components:   Troponin I (High Sensitivity) 199 (*)    All other components within normal limits  URINE CULTURE  RESPIRATORY PANEL BY PCR  SARS CORONAVIRUS 2 BY RT PCR  LACTIC ACID, PLASMA  RAPID URINE DRUG SCREEN, HOSP PERFORMED  AMMONIA  BLOOD GAS, ARTERIAL  HIV ANTIBODY (ROUTINE TESTING W REFLEX)  CBC  CREATININE, SERUM  CBC  BASIC METABOLIC PANEL  BLOOD GAS, ARTERIAL  MAGNESIUM  PHOSPHORUS  CBG MONITORING, ED  TROPONIN I (HIGH SENSITIVITY)    EKG EKG Interpretation  Date/Time:  Monday October 19 2021 17:22:12 EDT Ventricular Rate:  87 PR Interval:  137 QRS Duration: 82 QT Interval:  349 QTC Calculation: 420 R Axis:   120 Text Interpretation: Sinus rhythm Low voltage with right axis deviation Confirmed by Bethann Berkshire [phone]) on 10/19/2021 8:46:37 PM  Radiology DG Chest Port 1 View  Result Date: 10/19/2021 CLINICAL DATA:  Shortness of breath, failure to thrive, altered mental status EXAM: PORTABLE CHEST 1 VIEW COMPARISON:  06/16/2021 FINDINGS: Low lung volumes. Perihilar  vascular crowding. No focal consolidation. No pleural effusion or pneumothorax. The heart is top-normal in size for inspiration. IMPRESSION: No evidence of acute cardiopulmonary disease. Electronically Signed   By: Charline Bills M.D.   On: 10/19/2021 17:43    Procedures Procedures  {Document cardiac monitor, telemetry assessment procedure when appropriate:1}  Medications Ordered in ED Medications  cefTRIAXone (ROCEPHIN) 2 g in sodium chloride 0.9 % 100 mL IVPB (2 g Intravenous New Bag/Given 10/19/21 2115)  docusate sodium (COLACE) capsule 100 mg (has no administration in time range)  polyethylene glycol (MIRALAX / GLYCOLAX) packet 17 g (has no administration in time range)  heparin injection 5,000 Units (has no administration in time range)  0.9 %  sodium chloride infusion (has no administration in time range)  cefTRIAXone (ROCEPHIN) 1 g in sodium chloride 0.9 % 100 mL IVPB (has no administration in time range)  methylPREDNISolone sodium succinate (SOLU-MEDROL) 125 mg/2 mL injection 125 mg (125 mg Intravenous Given 10/19/21 1912)  sodium chloride 0.9 % bolus 1,000 mL (0 mLs Intravenous Stopped 10/19/21 2117)    ED Course/ Medical Decision Making/ A&P  CRITICAL CARE Performed by: Bethann Berkshire Total critical care time: 45 minutes Critical care time was exclusive of separately billable procedures and treating other patients. Critical care was necessary to treat or prevent imminent or life-threatening deterioration. Critical care was time spent personally by me on the following activities: development of treatment plan with patient and/or surrogate as well as nursing, discussions with consultants, evaluation of patient's response to treatment, examination of patient, obtaining history from patient or surrogate, ordering and performing treatments and interventions, ordering and review of laboratory studies, ordering and review of radiographic studies, pulse oximetry and re-evaluation of  patient's condition.   Patient with respiratory failure and hypercarbia.  Patient was seen by critical care who will admit the patient.  Patient is on BiPAP                         Medical Decision Making Amount and/or Complexity of Data Reviewed Labs: ordered. Radiology: ordered. ECG/medicine tests: ordered.  Risk Prescription drug management. Decision regarding hospitalization.   Significant respiratory failure and hypercarbia  {Document critical care time when appropriate:1} {Document review of labs and clinical decision tools ie heart score, Chads2Vasc2 etc:1}  {Document your independent review of radiology images, and any outside records:1} {Document your discussion with family members, caretakers, and with consultants:1} {Document social determinants of health affecting pt's care:1} {Document your decision making why or why not admission, treatments were needed:1} Final Clinical Impression(s) / ED  Diagnoses Final diagnoses:  Acute respiratory failure with hypercapnia (HCC)    Rx / DC Orders ED Discharge Orders     None

## 2021-10-19 NOTE — Social Work (Signed)
CSW added shelters resources to this Pt chart, per consult Pt has no housing after today. TOC can offer shelters at this time.

## 2021-10-19 NOTE — ED Triage Notes (Signed)
Patient presents from [address] due to failure to thrive for 3 weeks and AMS. She has not been eating or drinking  and has been relieving her bowels and urinating on herself. Police were called to the motel because the patient was getting kicked out today. EMS reports the patient being A&O x 1. Family would like both a medical and psych evaluation.     HX: Previous IVC in June, CPAP (non compliant)    EMS vitals: 130/77 BP 80 HR 22 RR 64% SPO2 on room air, 92% SPO2 on 8 L nasal canula 100 CBG

## 2021-10-20 ENCOUNTER — Inpatient Hospital Stay (HOSPITAL_COMMUNITY): Payer: Medicaid Other

## 2021-10-20 DIAGNOSIS — I5033 Acute on chronic diastolic (congestive) heart failure: Secondary | ICD-10-CM

## 2021-10-20 DIAGNOSIS — J9602 Acute respiratory failure with hypercapnia: Secondary | ICD-10-CM | POA: Diagnosis not present

## 2021-10-20 LAB — RESPIRATORY PANEL BY PCR

## 2021-10-20 LAB — BLOOD GAS, ARTERIAL
Acid-Base Excess: 2.9 mmol/L — ABNORMAL HIGH (ref 0.0–2.0)
Acid-Base Excess: 1.6 mmol/L (ref 0.0–2.0)
Acid-Base Excess: 6 mmol/L — ABNORMAL HIGH (ref 0.0–2.0)
Bicarbonate: 34.3 mmol/L — ABNORMAL HIGH (ref 20.0–28.0)
Bicarbonate: 34.1 mmol/L — ABNORMAL HIGH (ref 20.0–28.0)
Bicarbonate: 30.6 mmol/L — ABNORMAL HIGH (ref 20.0–28.0)
Drawn by: 86037
FIO2: 50 %
MECHVT: 380 mL
O2 Saturation: 98.6 %
O2 Saturation: 99.3 %
O2 Saturation: 96.9 %
PEEP: 8 cmH2O
Patient temperature: 36.2
Patient temperature: 36.4
Patient temperature: 37
RATE: 30 resp/min
pCO2 arterial: 89 mmHg (ref 32–48)
pCO2 arterial: 102 mmHg (ref 32–48)
pCO2 arterial: 43 mmHg (ref 32–48)
pH, Arterial: 7.19 — CL (ref 7.35–7.45)
pH, Arterial: 7.13 — CL (ref 7.35–7.45)
pH, Arterial: 7.46 — ABNORMAL HIGH (ref 7.35–7.45)
pO2, Arterial: 102 mmHg (ref 83–108)
pO2, Arterial: 113 mmHg — ABNORMAL HIGH (ref 83–108)
pO2, Arterial: 80 mmHg — ABNORMAL LOW (ref 83–108)

## 2021-10-20 LAB — BASIC METABOLIC PANEL
Anion gap: 14 (ref 5–15)
BUN: 28 mg/dL — ABNORMAL HIGH (ref 6–20)
CO2: 29 mmol/L (ref 22–32)
Calcium: 8.2 mg/dL — ABNORMAL LOW (ref 8.9–10.3)
Chloride: 102 mmol/L (ref 98–111)
Creatinine, Ser: 2.2 mg/dL — ABNORMAL HIGH (ref 0.44–1.00)
GFR, Estimated: 26 mL/min — ABNORMAL LOW (ref >60)
Glucose, Bld: 111 mg/dL — ABNORMAL HIGH (ref 70–99)
Potassium: 4.5 mmol/L (ref 3.5–5.1)
Sodium: 145 mmol/L (ref 135–145)

## 2021-10-20 LAB — HIV ANTIBODY (ROUTINE TESTING W REFLEX): HIV Screen 4th Generation wRfx: NONREACTIVE

## 2021-10-20 LAB — CBC
HCT: 50.2 % — ABNORMAL HIGH (ref 36.0–46.0)
Hemoglobin: 12.9 g/dL (ref 12.0–15.0)
MCH: 22.4 pg — ABNORMAL LOW (ref 26.0–34.0)
MCHC: 25.7 g/dL — ABNORMAL LOW (ref 30.0–36.0)
MCV: 87.2 fL (ref 80.0–100.0)
Platelets: 261 10*3/uL (ref 150–400)
RBC: 5.76 MIL/uL — ABNORMAL HIGH (ref 3.87–5.11)
RDW: 21.6 % — ABNORMAL HIGH (ref 11.5–15.5)
WBC: 8.1 10*3/uL (ref 4.0–10.5)
nRBC: 0.5 % — ABNORMAL HIGH (ref 0.0–0.2)

## 2021-10-20 LAB — ECHOCARDIOGRAM COMPLETE
AR max vel: 2.7 cm2
AV Peak grad: 17.4 mmHg
Ao pk vel: 2.09 m/s
Area-P 1/2: 2.83 cm2
Height: 61 in
S' Lateral: 2.9 cm
Weight: 7200 oz

## 2021-10-20 LAB — GLUCOSE, CAPILLARY
Glucose-Capillary: 136 mg/dL — ABNORMAL HIGH (ref 70–99)
Glucose-Capillary: 120 mg/dL — ABNORMAL HIGH (ref 70–99)
Glucose-Capillary: 142 mg/dL — ABNORMAL HIGH (ref 70–99)
Glucose-Capillary: 145 mg/dL — ABNORMAL HIGH (ref 70–99)
Glucose-Capillary: 160 mg/dL — ABNORMAL HIGH (ref 70–99)

## 2021-10-20 LAB — PHOSPHORUS: Phosphorus: 6 mg/dL — ABNORMAL HIGH (ref 2.5–4.6)

## 2021-10-20 LAB — MAGNESIUM: Magnesium: 2.3 mg/dL (ref 1.7–2.4)

## 2021-10-20 LAB — MRSA NEXT GEN BY PCR, NASAL: MRSA by PCR Next Gen: NOT DETECTED

## 2021-10-20 MED ORDER — ORAL CARE MOUTH RINSE: 15.0000 mL | OROMUCOSAL | Status: DC | PRN

## 2021-10-20 MED ORDER — PANTOPRAZOLE 2 MG/ML SUSPENSION
40.0000 mg | Freq: Every day | ORAL | Status: DC
  Administered 2021-10-20 – 2021-10-22 (×3): 40 mg
  Filled 2021-10-20 (×3): qty 20

## 2021-10-20 MED ORDER — DOCUSATE SODIUM 50 MG/5ML PO LIQD
100.0000 mg | Freq: Two times a day (BID) | ORAL | Status: DC
Start: 2021-10-20 — End: 2021-10-23
  Administered 2021-10-20 – 2021-10-22 (×5): 100 mg
  Filled 2021-10-20 (×5): qty 10

## 2021-10-20 MED ORDER — CHLORHEXIDINE GLUCONATE CLOTH 2 % EX PADS
6.0000 | MEDICATED_PAD | Freq: Every day | CUTANEOUS | Status: DC
  Administered 2021-10-20 – 2021-10-28 (×9): 6 via TOPICAL

## 2021-10-20 MED ORDER — FENTANYL BOLUS VIA INFUSION
50.0000 ug | INTRAVENOUS | Status: DC | PRN
  Administered 2021-10-21 (×2): 50 ug via INTRAVENOUS

## 2021-10-20 MED ORDER — FENTANYL CITRATE PF 50 MCG/ML IJ SOSY
50.0000 ug | PREFILLED_SYRINGE | INTRAMUSCULAR | Status: DC | PRN
  Administered 2021-10-20: 50 ug via INTRAVENOUS

## 2021-10-20 MED ORDER — POLYETHYLENE GLYCOL 3350 17 G PO PACK
17.0000 g | PACK | Freq: Every day | ORAL | Status: DC
Start: 2021-10-20 — End: 2021-10-23
  Administered 2021-10-20 – 2021-10-22 (×3): 17 g
  Filled 2021-10-20 (×3): qty 1

## 2021-10-20 MED ORDER — ORAL CARE MOUTH RINSE
15.0000 mL | OROMUCOSAL | Status: DC
  Administered 2021-10-20 (×2): 15 mL via OROMUCOSAL

## 2021-10-20 MED ORDER — FENTANYL CITRATE PF 50 MCG/ML IJ SOSY
50.0000 ug | PREFILLED_SYRINGE | INTRAMUSCULAR | Status: DC | PRN
  Administered 2021-10-20: 200 ug via INTRAVENOUS
  Administered 2021-10-20: 100 ug via INTRAVENOUS
  Filled 2021-10-20: qty 4

## 2021-10-20 MED ORDER — FENTANYL 2500MCG IN NS 250ML (10MCG/ML) PREMIX INFUSION
50.0000 ug/h | INTRAVENOUS | Status: DC
  Administered 2021-10-20: 50 ug/h via INTRAVENOUS
  Filled 2021-10-20: qty 250

## 2021-10-20 MED ORDER — ORAL CARE MOUTH RINSE
15.0000 mL | OROMUCOSAL | Status: DC
  Administered 2021-10-20 – 2021-10-22 (×25): 15 mL via OROMUCOSAL

## 2021-10-20 MED ORDER — ALBUTEROL SULFATE (2.5 MG/3ML) 0.083% IN NEBU
2.5000 mg | INHALATION_SOLUTION | RESPIRATORY_TRACT | Status: DC | PRN
  Administered 2021-10-20: 2.5 mg via RESPIRATORY_TRACT

## 2021-10-20 MED ORDER — PROPOFOL 1000 MG/100ML IV EMUL
0.0000 ug/kg/min | INTRAVENOUS | Status: DC
  Administered 2021-10-20: 15 ug/kg/min via INTRAVENOUS
  Administered 2021-10-20: 45 ug/kg/min via INTRAVENOUS
  Administered 2021-10-20 (×2): 15 ug/kg/min via INTRAVENOUS
  Administered 2021-10-20: 25 ug/kg/min via INTRAVENOUS
  Administered 2021-10-20 (×2): 45 ug/kg/min via INTRAVENOUS
  Administered 2021-10-21: 10 ug/kg/min via INTRAVENOUS
  Administered 2021-10-21: 15 ug/kg/min via INTRAVENOUS
  Administered 2021-10-21 (×2): 10 ug/kg/min via INTRAVENOUS
  Filled 2021-10-20 (×4): qty 100
  Filled 2021-10-20: qty 200
  Filled 2021-10-20 (×5): qty 100

## 2021-10-20 MED ORDER — ETOMIDATE 2 MG/ML IV SOLN
40.0000 mg | Freq: Once | INTRAVENOUS | Status: AC
  Administered 2021-10-20: 40 mg via INTRAVENOUS

## 2021-10-20 MED ORDER — MIDAZOLAM HCL 2 MG/2ML IJ SOLN
4.0000 mg | Freq: Once | INTRAMUSCULAR | Status: AC
  Administered 2021-10-20: 4 mg via INTRAVENOUS

## 2021-10-20 MED ORDER — MIDAZOLAM HCL (PF) 10 MG/2ML IJ SOLN: 4.0000 mg | Freq: Once | INTRAMUSCULAR | Status: DC

## 2021-10-20 MED ORDER — FENTANYL CITRATE PF 50 MCG/ML IJ SOSY
50.0000 ug | PREFILLED_SYRINGE | Freq: Once | INTRAMUSCULAR | Status: AC
  Administered 2021-10-20: 50 ug via INTRAVENOUS

## 2021-10-20 NOTE — Progress Notes (Signed)
eLink Physician-Brief Progress Note Patient Name: Felicia Warren DOB: 1970-11-06 MRN: [id number]   Date of Service  10/20/2021  HPI/Events of Note  CXR reviewed to verify ETT placement. ETT terminated about 1cm above the carina.    eICU Interventions  Pull back ETT by 2cm and repeat CXR. Follow up ABG post intubation.      Intervention Category Intermediate Interventions: Diagnostic test evaluation  [name] 10/20/2021, 7:03 AM

## 2021-10-20 NOTE — Procedures (Signed)
Intubation Procedure Note  Felicia Warren  [id number]  [birth year]  Date:10/20/21  Time:6:24 AM   Provider Performing:Brya Simerly Gaynell Face    Procedure: Intubation (31500)  Indication(s) Respiratory Failure  Consent Risks of the procedure as well as the alternatives and risks of each were explained to the patient and/or caregiver.  Consent for the procedure was obtained and is signed in the bedside chart   Anesthesia Etomidate, Versed, Fentanyl, and see mar   Time Out Verified patient identification, verified procedure, site/side was marked, verified correct patient position, special equipment/implants available, medications/allergies/relevant history reviewed, required imaging and test results available.   Sterile Technique Usual hand hygeine, masks, and gloves were used   Procedure Description Patient positioned in bed supine.  Sedation given as noted above.  Patient was intubated with endotracheal tube using Glidescope.  View was Grade 2 only posterior commissure .  Number of attempts was 1.  Colorimetric CO2 detector was consistent with tracheal placement.   Complications/Tolerance None; patient tolerated the procedure well. Chest X-ray is ordered to verify placement.   EBL Minimal   Specimen(s) None

## 2021-10-20 NOTE — Progress Notes (Signed)
eLink Physician-Brief Progress Note Patient Name: Felicia Warren DOB: 1970/07/22 MRN: [id number]   Date of Service  10/20/2021  HPI/Events of Note  51/F with morbid obesity, DM, HFpEF, OSA non-compliant with NIV, failure to thrive, brought in from [address] when she was about to be evicted and she was found by police confused in room with feces and urine.   In the ED, she was noted to be drowsy, with hypercapnic with pCOs in the 80s.  She was placed on BIPAP.    BP HR 88, RR 23, O2 sats 96%  CXR with perihilar vascular crowding, low lung volumes.  BNP 778, crea 2.38 <-- 0.7 COVID negative  eICU Interventions  Acute hypercapnic respiratory failure HFpEF Possible COPD exacerbation AKI Elevated troponin Lactic acidosis  BIPAP settings changed.  Awaiting repeat ABG results --> 7.19/89/102.  Pt started on steroids and neb treatments but also may be due to CHF exacerbation.  Hold off on fluids.  Increase BIPAP to 24/8, increase RR to 24. Repeat ABG in 1 hour.  Pt at risk for intubation.  Heparin for DVT prophylaxis.       Intervention Category Evaluation Type: New Patient Evaluation  [name] 10/20/2021, 1:00 AM

## 2021-10-20 NOTE — ED Notes (Signed)
Per ICU charge Denny Peon, 1222 ready for patient.

## 2021-10-20 NOTE — Progress Notes (Signed)
BLE venous duplex has been completed.   Results can be found under chart review under CV PROC. 10/20/2021 11:38 AM Pranav Lince RVT, RDMS

## 2021-10-20 NOTE — Progress Notes (Signed)
ETT pulled back from 25cm to 23cm per MD order

## 2021-10-20 NOTE — Progress Notes (Addendum)
NAME:  Felicia Warren, MRN:  [id number], DOB:  Mar 02, 1970, LOS: 1 ADMISSION DATE:  10/19/2021, CONSULTATION DATE:  10/19/21 REFERRING MD:  EDP, CHIEF COMPLAINT:  acute hypercarbic resp failure   History of Present Illness:  51 yo female with pmh dm2, hfpEF, chronic osa non adherent with home niv. Pt has  been living in [address] with failure to thrive for approximately 3 weeks per EDP report.  Patient's sister called police to perform welfare check and found pt confused in room covered in feces and urine.   In ER, she was drowsy found to be hypercarbic with AKI, acute liver injury, and elevated bnp/trop with known hfpef.  She was placed on niv and admitted to [facility].    Pertinent  Medical History  HFpEF Morbid obesity PE Normocytic anemia Osa non adherent to bipap at home.   Significant Hospital Events: Including procedures, antibiotic start and stop dates in addition to other pertinent events   8/28: admitted to ICU 2/2 resp failure 8/29 failed NIV, intubated   Interim History / Subjective:  Failed NIV requiring intubation this morning.  Woke up agitated this morning, not f/c, reaching for ETT  Objective   Blood pressure 129/74, pulse 82, temperature (!) 97.2 F (36.2 C), temperature source Bladder, resp. rate 12, height 5\' 1"  (1.549 m), weight (!) 204.1 kg, SpO2 100 %.    Vent Mode: PRVC FiO2 (%):  [100 %] 100 % Set Rate:  [30 bmp] 30 bmp PEEP:  [8 cmH20] 8 cmH20 Plateau Pressure:  [27 cmH20] 27 cmH20   Intake/Output Summary (Last 24 hours) at 10/20/2021 0715 Last data filed at 10/20/2021 0036 Gross per 24 hour  Intake 1150.5 ml  Output 100 ml  Net 1050.5 ml   Filed Weights   10/20/21 0700  Weight: (!) 204.1 kg    Examination: General:  Morbidly obese female sedated/ intubated on MV, smells of urinary incontinence  HEENT: MM pink/moist, pupils 2/ reactive, anicteric , ETT/ OGT Neuro:  sedated CV: NSR, rr, no obv murmur PULM:  MV supported breaths, clear anteriorly,  diminished in bases/ lg body habitus GI: obese, NT, +bs, foley amber Extremities: warm/dry, RLE edema> mostly in thigh/ knee area, otherwise no non pitting edema Skin: no rashes   Resolved Hospital Problem list   Lactic acidosis   Assessment & Plan:  Acute on chronic hypercarbic resp failure Possible AECOPD OSA/ OHS non-compliant with home NIV - failed NIV overnight requiring intubation - cont MV support, 4-8cc/kg IBW with goal Pplat <30 and DP<15  - VAP prevention protocol/ PPI - PAD protocol for sedation> propofol/ prn fentanyl, RASS goal 0/-1 - wean FiO2 as able for SpO2 >92%  - daily SAT & SBT - pending repeat CXR after ETT retraction and post intubation ABG - prn duonebs - continue steroids for now - pending TTE as below  - COVID/ flu/ RVP all negative   Hx LLL PE 08/2020 - not on AC - pending echo, no major O2 requirements thus far> weaning this am - check LE dopplers given RLE edema   HFpEF  Elevated troponin  - Pending repeat TTE.  Prior echo 05/2021 EF 60-65%, G2DD, norm RV and PASP - trop 199> 209, BNP 778, EKG non acute thus far> suspect trop elevated 2/2 demand  - on diuretics for now pending echo, if RV ok, will diurese  AKI with oliguria  - baseline Cr 0.7 - sCr 2.38> 2.2 - hold further MIVF for now - consider renal US/ urine studies  -  strict I/Os  Suspected UTI  - pending UC - continue empiric ceftriaxone for now  Transaminitis - recheck LFTs in am, ? Congestion - ammonia 23 - if up, consider RUQ [place]  Other> family/ sister Arline Asp voices concern over her sister's ability to take care of herself.  She is a very private person and rarely reaches out, except for money at times.  Arline Asp reports she signed herself out of SNF/ rehab in the spring and "disappeared".  Back in April, she was IVC'd by her family for self neglect and care concerns.  Evaluated by psych then, found to not have any psych issues.   Admitted 06/2020 as [name] for several days until  family reached with similar presentation of non-compliance, self neglect, but required intubation that admission for resp failure.  Arline Asp is working on/ interested in getting guardianship over her sister.    Best Practice (right click and "Reselect all SmartList Selections" daily)   Diet/type: NPO DVT prophylaxis: SCD / heparin SQ for now GI prophylaxis: PPI Lines: N/A Foley:  Yes, and it is still needed Code Status:  full code Last date of multidisciplinary goals of care discussion [with sister Mrs, [name], awaiting call back after speaking with siblings. ]  Pending update 8/29  Labs   CBC: Recent Labs  Lab 10/19/21 1836 10/20/21 0247  WBC 9.7 8.1  NEUTROABS 6.8  --   HGB 12.8 12.9  HCT 48.5* 50.2*  MCV 84.3 87.2  PLT 308 261    Basic Metabolic Panel: Recent Labs  Lab 10/19/21 1836 10/20/21 0247  NA 146* 145  K 4.2 4.5  CL 102 102  CO2 30 29  GLUCOSE 100* 111*  BUN 27* 28*  CREATININE 2.38* 2.20*  CALCIUM 8.5* 8.2*  MG  --  2.3  PHOS  --  6.0*   GFR: Estimated Creatinine Clearance: 51.5 mL/min (A) (by C-G formula based on SCr of 2.2 mg/dL (H)). Recent Labs  Lab 10/19/21 1836 10/19/21 1837 10/19/21 2040 10/20/21 0247  WBC 9.7  --   --  8.1  LATICACIDVEN  --  2.2* 1.6  --     Liver Function Tests: Recent Labs  Lab 10/19/21 1836  AST 127*  ALT 65*  ALKPHOS 80  BILITOT 4.3*  PROT 7.8  ALBUMIN 4.0   No results for input(s): "LIPASE", "AMYLASE" in the last 168 hours. Recent Labs  Lab 10/19/21 1838  AMMONIA 23    ABG    Component Value Date/Time   PHART 7.13 (LL) 10/20/2021 0425   PCO2ART 102 (HH) 10/20/2021 0425   PO2ART 113 (H) 10/20/2021 0425   HCO3 34.1 (H) 10/20/2021 0425   TCO2 35 (H) 06/25/2020 2043   O2SAT 99.3 10/20/2021 0425     Coagulation Profile: No results for input(s): "INR", "PROTIME" in the last 168 hours.  Cardiac Enzymes: No results for input(s): "CKTOTAL", "CKMB", "CKMBINDEX", "TROPONINI" in the last 168  hours.  HbA1C: Hgb A1c MFr Bld  Date/Time Value Ref Range Status  06/18/2021 04:29 AM 6.2 (H) 4.8 - 5.6 % Final    Comment:    (NOTE) Pre diabetes:          5.7%-6.4%  Diabetes:              >6.4%  Glycemic control for   <7.0% adults with diabetes   06/26/2020 03:20 AM 6.3 (H) 4.8 - 5.6 % Final    Comment:    (NOTE) Pre diabetes:  5.7%-6.4%  Diabetes:              >6.4%  Glycemic control for   <7.0% adults with diabetes     CBG: Recent Labs  Lab 10/19/21 1658  GLUCAP 90    Critical care time: 38 mins      Posey Boyer, MSN, AG-ACNP-BC [facility] 10/20/2021, 7:15 AM  See Amion for pager If no response to pager, please call PCCM consult pager After 7:00 pm call Elink

## 2021-10-20 NOTE — ED Notes (Signed)
ED TO INPATIENT HANDOFF REPORT  ED Care Team Name and Phone Pieter Partridge EMT-P [phone]  S Name/Age/Gender Felicia Warren 51 y.o. female Room/Bed: WA04/WA04  Code Status   Code Status: Full Code  Home/SNF/Other Home    Triage Complete: Triage complete  Chief Complaint Acute hypercapnic respiratory failure (HCC) [J96.02]  Triage Note Patient presents from [address] due to failure to thrive for 3 weeks and AMS. She has not been eating or drinking  and has been relieving her bowels and urinating on herself. Police were called to the motel because the patient was getting kicked out today. EMS reports the patient being A&O x 1. Family would like both a medical and psych evaluation.     HX: Previous IVC in June, CPAP (non compliant)    EMS vitals: 130/77 BP 80 HR 22 RR 64% SPO2 on room air, 92% SPO2 on 8 L nasal canula 100 CBG    Allergies No Known Allergies  Level of Care/Admitting Diagnosis ED Disposition     ED Disposition  Admit   Condition  --   Comment  Hospital Area: [facility] [[id number]]  Level of Care: ICU [6]  May admit patient to [facility] if equivalent level of care is available:: Yes  Covid Evaluation: Symptomatic Person Under Investigation (PUI) or recent exposure (last 10 days) *Testing Required*  Diagnosis: Acute hypercapnic respiratory failure [facility]) [[id number]]  Admitting Physician: Briant Sites [[id number]]  Attending Physician: Briant Sites [phone]  Certification:: I certify this patient will need inpatient services for at least 2 midnights  Estimated Length of Stay: 5          B Medical/Surgery History Past Medical History:  Diagnosis Date   Acute respiratory failure with hypoxia and hypercarbia (HCC)    AKI (acute kidney injury) (HCC)    Community acquired pneumonia    Demand ischemia (HCC)    Diarrhea    Hypercapnic respiratory failure (HCC) 06/25/2020   Hypoglycemia    Hypokalemia     Morbid obesity (HCC)    Normocytic anemia    OSA treated with BiPAP    Pulmonary embolism (HCC)    Transaminitis    History reviewed. No pertinent surgical history.   A IV Location/Drains/Wounds Patient Lines/Drains/Airways Status     Active Line/Drains/Airways     Name Placement date Placement time Site Days   Peripheral IV 10/19/21 20 G Right Antecubital 10/19/21  1827  Antecubital  1   Peripheral IV 10/19/21 20 G Anterior;Left;Proximal Forearm 10/19/21  2045  Forearm  1   Urethral Catheter Kerri Straight-tip;Latex 14 Fr. 10/19/21  1753  Straight-tip;Latex  1   Wound / Incision (Open or Dehisced) 07/04/20 (MASD) Moisture Associated Skin Damage;Non-pressure wound;(MARSI) Medical Adhesive-Related Skin Injury Lumbar Right;Left open skin in back folds  MASD 07/04/20  1200  Lumbar  473            Intake/Output Last 24 hours  Intake/Output Summary (Last 24 hours) at 10/20/2021 0013 Last data filed at 10/20/2021 0012 Gross per 24 hour  Intake 1100.5 ml  Output 100 ml  Net 1000.5 ml    Labs/Imaging Results for orders placed or performed during the hospital encounter of 10/19/21 (from the past 48 hour(s))  CBG monitoring, ED     Status: None   Collection Time: 10/19/21  4:58 PM  Result Value Ref Range   Glucose-Capillary 90 70 - 99 mg/dL    Comment: Glucose reference range applies only to samples taken after  fasting for at least 8 hours.  Blood gas, venous (at [facility], not at [facility])     Status: Abnormal   Collection Time: 10/19/21  6:35 PM  Result Value Ref Range   pH, Ven 7.23 (L) 7.25 - 7.43   pCO2, Ven 84 (HH) 44 - 60 mmHg    Comment: CRITICAL RESULT CALLED TO, READ BACK BY AND VERIFIED WITH: SHAW S. RN @1843  ON 10/19/21 BY KERLANDIA C.    pO2, Ven 53 (H) 32 - 45 mmHg   Bicarbonate 35.2 (H) 20.0 - 28.0 mmol/L   Acid-Base Excess 4.7 (H) 0.0 - 2.0 mmol/L   O2 Saturation 73.8 %   Patient temperature 37.0     Comment: Performed at [facility], [address], [place], [place]  CBC with Differential     Status: Abnormal   Collection Time: 10/19/21  6:36 PM  Result Value Ref Range   WBC 9.7 4.0 - 10.5 K/uL   RBC 5.75 (H) 3.87 - 5.11 MIL/uL   Hemoglobin 12.8 12.0 - 15.0 g/dL   HCT 10/21/21 (H) 78.9 - 38.1 %   MCV 84.3 80.0 - 100.0 fL   MCH 22.3 (L) 26.0 - 34.0 pg   MCHC 26.4 (L) 30.0 - 36.0 g/dL   RDW 01.7 (H) 51.0 - 25.8 %   Platelets 308 150 - 400 K/uL   nRBC 0.3 (H) 0.0 - 0.2 %   Neutrophils Relative % 71 %   Neutro Abs 6.8 1.7 - 7.7 K/uL   Lymphocytes Relative 18 %   Lymphs Abs 1.8 0.7 - 4.0 K/uL   Monocytes Relative 10 %   Monocytes Absolute 1.0 0.1 - 1.0 K/uL   Eosinophils Relative 0 %   Eosinophils Absolute 0.0 0.0 - 0.5 K/uL   Basophils Relative 0 %   Basophils Absolute 0.0 0.0 - 0.1 K/uL   WBC Morphology VACUOLATED NEUTROPHILS    Immature Granulocytes 1 %   Abs Immature Granulocytes 0.13 (H) 0.00 - 0.07 K/uL   Tear Drop Cells PRESENT    Polychromasia PRESENT    Target Cells PRESENT    Ovalocytes PRESENT     Comment: Performed at [facility], [address], [place], [place]  Comprehensive metabolic panel     Status: Abnormal   Collection Time: 10/19/21  6:36 PM  Result Value Ref Range   Sodium 146 (H) 135 - 145 mmol/L   Potassium 4.2 3.5 - 5.1 mmol/L   Chloride 102 98 - 111 mmol/L   CO2 30 22 - 32 mmol/L   Glucose, Bld 100 (H) 70 - 99 mg/dL    Comment: Glucose reference range applies only to samples taken after fasting for at least 8 hours.   BUN 27 (H) 6 - 20 mg/dL   Creatinine, Ser 10/21/21 (H) 0.44 - 1.00 mg/dL   Calcium 8.5 (L) 8.9 - 10.3 mg/dL   Total Protein 7.8 6.5 - 8.1 g/dL   Albumin 4.0 3.5 - 5.0 g/dL   AST 3.53 (H) 15 - 41 U/L   ALT 65 (H) 0 - 44 U/L   Alkaline Phosphatase 80 38 - 126 U/L   Total Bilirubin 4.3 (H) 0.3 - 1.2 mg/dL   GFR, Estimated 24 (L) >60 mL/min    Comment: (NOTE) Calculated using the CKD-EPI Creatinine Equation (2021)    Anion gap 14 5 - 15     Comment: Performed at [facility], [address] [place], [place]  [id number]  Troponin I (High Sensitivity)     Status: Abnormal   Collection Time: 10/19/21  6:36 PM  Result Value Ref Range   Troponin I (High Sensitivity) 199 (HH) <18 ng/L    Comment: CRITICAL RESULT CALLED TO, READ BACK BY AND VERIFIED WITH LAND S. RN @1927  ON 10/19/21 BY KERLANDIA C. (NOTE) Elevated high sensitivity troponin I (hsTnI) values and significant  changes across serial measurements may suggest ACS but many other  chronic and acute conditions are known to elevate hsTnI results.  Refer to the "Links" section for chest pain algorithms and additional  guidance. Performed at [facility], [address] [place], [place]   Brain natriuretic peptide     Status: Abnormal   Collection Time: 10/19/21  6:37 PM  Result Value Ref Range   B Natriuretic Peptide 778.8 (H) 0.0 - 100.0 pg/mL    Comment: Performed at [facility], [address], [place], [place]  Lactic acid, plasma     Status: Abnormal   Collection Time: 10/19/21  6:37 PM  Result Value Ref Range   Lactic Acid, Venous 2.2 (HH) 0.5 - 1.9 mmol/L    Comment: CRITICAL RESULT CALLED TO, READ BACK BY AND VERIFIED WITH LAND S. RN @1927  ON 10/19/21 BY KERLANDIA C. Performed at [facility], [address] [place], [place]   Ammonia     Status: None   Collection Time: 10/19/21  6:38 PM  Result Value Ref Range   Ammonia 23 9 - 35 umol/L    Comment: Performed at [facility], [address], [place], [place]  Urinalysis, Routine w reflex microscopic     Status: Abnormal   Collection Time: 10/19/21  6:48 PM  Result Value Ref Range   Color, Urine AMBER (A) YELLOW    Comment: BIOCHEMICALS MAY BE AFFECTED BY COLOR   APPearance CLOUDY (A) CLEAR   Specific Gravity, Urine 1.014 1.005 - 1.030   pH 5.0 5.0 - 8.0   Glucose, UA  NEGATIVE NEGATIVE mg/dL   Hgb urine dipstick SMALL (A) NEGATIVE   Bilirubin Urine NEGATIVE NEGATIVE   Ketones, ur NEGATIVE NEGATIVE mg/dL   Protein, [place] [address] (A) NEGATIVE mg/dL   Nitrite NEGATIVE NEGATIVE   Leukocytes,Ua NEGATIVE NEGATIVE   RBC / HPF 6-10 0 - 5 RBC/hpf   WBC, UA 21-50 0 - 5 WBC/hpf   Bacteria, UA RARE (A) NONE SEEN   Squamous Epithelial / LPF 0-5 0 - 5   Mucus PRESENT    Hyaline Casts, UA PRESENT     Comment: Performed at [facility], [address] [place], [place]  Rapid urine drug screen (hospital performed)     Status: None   Collection Time: 10/19/21  6:48 PM  Result Value Ref Range   Opiates NONE DETECTED NONE DETECTED   Cocaine NONE DETECTED NONE DETECTED   Benzodiazepines NONE DETECTED NONE DETECTED   Amphetamines NONE DETECTED NONE DETECTED   Tetrahydrocannabinol NONE DETECTED NONE DETECTED   Barbiturates NONE DETECTED NONE DETECTED    Comment: (NOTE) DRUG SCREEN FOR MEDICAL PURPOSES ONLY.  IF CONFIRMATION IS NEEDED FOR ANY PURPOSE, NOTIFY LAB WITHIN 5 DAYS.  LOWEST DETECTABLE LIMITS FOR URINE DRUG SCREEN Drug Class                     Cutoff (ng/mL) Amphetamine and metabolites    1000 Barbiturate and metabolites  200 Benzodiazepine                 200 Tricyclics and metabolites     300 Opiates and metabolites        300 Cocaine and metabolites        300 THC                            50 Performed at [facility], [address] [place], [place] [address]   Lactic acid, plasma     Status: None   Collection Time: 10/19/21  8:40 PM  Result Value Ref Range   Lactic Acid, Venous 1.6 0.5 - 1.9 mmol/L    Comment: Performed at [facility], [address] [place], [place] [address]  Troponin I (High Sensitivity)     Status: Abnormal   Collection Time: 10/19/21  8:40 PM  Result Value Ref Range   Troponin I (High Sensitivity) 209 (HH) <18 ng/L    Comment: CRITICAL RESULT CALLED  TO, READ BACK BY AND VERIFIED WITH Kathlene NovemberALKINGTON, JAKE RN @ 2129 10/19/2021 PER ENGL[place], K (NOTE) Elevated high sensitivity troponin I (hsTnI) values and significant  changes across serial measurements may suggest ACS but many other  chronic and acute conditions are known to elevate hsTnI results.  Refer to the "Links" section for chest pain algorithms and additional  guidance. Performed at [facility], [address] [place], [place] [address]   Blood gas, arterial     Status: Abnormal   Collection Time: 10/19/21  9:50 PM  Result Value Ref Range   Inspiratory PAP 20 cmH2O   Expiratory PAP 10 cmH2O   pH, Arterial 7.21 (L) 7.35 - 7.45   pCO2 arterial 84 (HH) 32 - 48 mmHg    Comment: CRITICAL RESULT CALLED TO, READ BACK BY AND VERIFIED WITH: LAMB, SONYA RN @ 2210 10/19/2021 PER ENGLAND, K    pO2, Arterial 78 (L) 83 - 108 mmHg   Bicarbonate 33.6 (H) 20.0 - 28.0 mmol/L   Acid-Base Excess 2.7 (H) 0.0 - 2.0 mmol/L   O2 Saturation 94.1 %   Patient temperature 36.4    Collection site LEFT RADIAL    Drawn by [phone]    Allens test (pass/fail) PASS PASS    Comment: Performed at [facility], [address] [place], [place] [address]  SARS Coronavirus 2 by RT PCR (hospital order, performed in [facility] hospital lab) *cepheid single result test*     Status: None   Collection Time: 10/19/21 11:12 PM  Result Value Ref Range   SARS Coronavirus 2 by RT PCR NEGATIVE NEGATIVE    Comment: (NOTE) SARS-CoV-2 target nucleic acids are NOT DETECTED.  The SARS-CoV-2 RNA is generally detectable in upper and lower respiratory specimens during the acute phase of infection. The lowest concentration of SARS-CoV-2 viral copies this assay can detect is 250 copies / mL. A negative result does not preclude SARS-CoV-2 infection and should not be used as the sole basis for treatment or other patient management decisions.  A negative result may occur with improper specimen  collection / handling, submission of specimen other than nasopharyngeal swab, presence of viral mutation(s) within the areas targeted by this assay, and inadequate number of viral copies (<250 copies / mL). A negative result must be combined with clinical observations, patient history, and epidemiological information.  Fact Sheet for Patients:   RoadLapTop.co.zahttps://www.fda.gov/media/158405/download  Fact Sheet  for Healthcare Providers: http://kim-miller.com/  This test is not yet approved or  cleared by the [place] and has been authorized for detection and/or diagnosis of SARS-CoV-2 by FDA under an Emergency Use Authorization (EUA).  This EUA will remain in effect (meaning this test can be used) for the duration of the COVID-19 declaration under Section 564(b)(1) of the Act, 21 U.S.C. section 360bbb-3(b)(1), unless the authorization is terminated or revoked sooner.  Performed at [facility], [address] [place], [place] [address]    DG Chest Port 1 View  Result Date: 10/19/2021 CLINICAL DATA:  Check central line placement EXAM: PORTABLE CHEST 1 VIEW COMPARISON:  Film from earlier in the same day. FINDINGS: Cardiac shadow is mildly prominent accentuated by the portable technique. The lungs are well aerated bilaterally without focal infiltrate. Increasing central vascular congestion is noted without significant edema. No confluent infiltrate or effusion is noted. No bony abnormality is seen. No central line is identified. Correlate with clinical history. Endotracheal tube is not well visualized as well. IMPRESSION: Endotracheal tube and central line are not well appreciated. Correlate with clinical history. Increasing central vascular congestion. Electronically Signed   By: Alcide Clever M.D.   On: 10/19/2021 21:56   DG Chest Port 1 View  Result Date: 10/19/2021 CLINICAL DATA:  Shortness of breath, failure to thrive, altered mental status EXAM:  PORTABLE CHEST 1 VIEW COMPARISON:  06/16/2021 FINDINGS: Low lung volumes. Perihilar vascular crowding. No focal consolidation. No pleural effusion or pneumothorax. The heart is top-normal in size for inspiration. IMPRESSION: No evidence of acute cardiopulmonary disease. Electronically Signed   By: Charline Bills M.D.   On: 10/19/2021 17:43    Pending Labs Unresulted Labs (From admission, onward)     Start     Ordered   10/20/21 0500  CBC  Tomorrow morning,   R        10/19/21 2118   10/20/21 0500  Basic metabolic panel  Tomorrow morning,   R        10/19/21 2118   10/20/21 0500  Blood gas, arterial  Tomorrow morning,   R        10/19/21 2118   10/20/21 0500  Magnesium  Tomorrow morning,   R        10/19/21 2118   10/20/21 0500  Phosphorus  Tomorrow morning,   R        10/19/21 2118   10/20/21 0000  Blood gas, arterial  Once,   R        10/19/21 2254   10/19/21 2121  Respiratory (~20 pathogens) panel by PCR  (Respiratory panel by PCR (~20 pathogens, ~24 hr TAT)  w precautions)  Once,   R        10/19/21 2121   10/19/21 2106  HIV Antibody (routine testing w rflx)  (HIV Antibody (Routine testing w reflex) panel)  Once,   R        10/19/21 2118   10/19/21 2046  Urine Culture  Once,   URGENT       Question:  Indication  Answer:  Dysuria   10/19/21 2045            Vitals/Pain Today's Vitals   10/19/21 2320 10/19/21 2320 10/19/21 2345 10/20/21 0000  BP:  118/67  124/72  Pulse:  83  84  Resp:  (!) 21 16 14   Temp:   (!) 97.3 F (36.3 C) (!) 97.2 F (36.2 C)  TempSrc:  Bladder  SpO2:  99%    PainSc: Asleep Asleep      Isolation Precautions Airborne and Contact precautions  Medications Medications  docusate sodium (COLACE) capsule 100 mg (has no administration in time range)  polyethylene glycol (MIRALAX / GLYCOLAX) packet 17 g (has no administration in time range)  heparin injection 5,000 Units (5,000 Units Subcutaneous Given 10/19/21 2235)  0.9 %  sodium chloride  infusion ( Intravenous New Bag/Given 10/19/21 2235)  cefTRIAXone (ROCEPHIN) 1 g in sodium chloride 0.9 % 100 mL IVPB (has no administration in time range)  methylPREDNISolone sodium succinate (SOLU-MEDROL) 125 mg/2 mL injection 125 mg (125 mg Intravenous Given 10/19/21 1912)  sodium chloride 0.9 % bolus 1,000 mL (0 mLs Intravenous Stopped 10/19/21 2117)  cefTRIAXone (ROCEPHIN) 2 g in sodium chloride 0.9 % 100 mL IVPB (0 g Intravenous Stopped 10/19/21 2238)    Mobility non-ambulatory High fall risk   Focused Assessments Pulmonary Assessment Handoff:  Lung sounds: Bilateral Breath Sounds: Diminished L Breath Sounds: Diminished R Breath Sounds: Diminished O2 Device: Bi-PAP      R Recommendations: See Admitting Provider Note  Report given to:   Additional Notes:

## 2021-10-20 NOTE — Progress Notes (Signed)
Initial Nutrition Assessment  DOCUMENTATION CODES:   Morbid obesity  INTERVENTION:   If expected to remain intubated >24 hours, recommend nutrition support: -Initiate Vital HP @ 20 ml/hr, advance by 10 ml every 12 hours to goal rate of 40 ml/hr. -60 ml Prosource TF 20 BID, each provides 80 kcals and 20g protein  Prior to any tube feeding please verify OGT tip position as was not identified on last abdominal x-ray 8/29.  Will be at refeeding syndrome risk.   Will monitor for plan  NUTRITION DIAGNOSIS:   Inadequate oral intake related to inability to eat as evidenced by NPO status.  GOAL:   Provide needs based on ASPEN/SCCM guidelines  MONITOR:   Vent status, Labs, Weight trends, I & O's, Skin  REASON FOR ASSESSMENT:   Malnutrition Screening Tool, Ventilator    ASSESSMENT:   51 yo female with pmh dm2, hfpEF, chronic osa non adherent with home niv. Pt has  been living in [address] with failure to thrive for approximately 3 weeks per EDP report.  Patient's sister called police to perform welfare check and found pt confused in room covered in feces and urine.   In ER, she was drowsy found to be hypercarbic with AKI, acute liver injury, and elevated bnp/trop with known hfpef.  Pt sedated, no family present, RN at bedside. Pt expected to remain intubated today.  Per chart review, pt was found covered in feces and urine, was likely not taking eating as well as not taking care of herself.   Pt with OGT, unable to verify tip position per abd x-ray.  Patient is currently intubated on ventilator support MV: 10.9 L/min Temp (24hrs), Avg:97.6 F (36.4 C), Min:96.3 F (35.7 C), Max:98.2 F (36.8 C) MAP: 75  Propofol: 55.1 ml/hr -> provides 1454 fat kcals   Medications: Colace, Miralax  Labs reviewed: CBGs: 120-136 Elevated Phos (6.0)  NUTRITION - FOCUSED PHYSICAL EXAM:  Flowsheet Row Most Recent Value  Orbital Region No depletion  Upper Arm Region No depletion   Thoracic and Lumbar Region No depletion  Buccal Region No depletion  Temple Region Mild depletion  Clavicle Bone Region No depletion  Clavicle and Acromion Bone Region No depletion  Scapular Bone Region No depletion  Dorsal Hand No depletion  Patellar Region No depletion  Anterior Thigh Region No depletion  Posterior Calf Region No depletion  Edema (RD Assessment) None  Hair Reviewed  Eyes Unable to assess  Mouth Unable to assess  Skin Reviewed       Diet Order:   Diet Order             Diet NPO time specified  Diet effective now                   EDUCATION NEEDS:   Not appropriate for education at this time  Skin:  Skin Assessment: Skin Integrity Issues: Skin Integrity Issues:: Other (Comment) Other: MASD back skin folds  Last BM:  PTA  Height:   Ht Readings from Last 1 Encounters:  10/20/21 5\' 1"  (1.549 m)  Height on 06/26/21: 5'4"  Weight:   Wt Readings from Last 1 Encounters:  10/20/21 (!) 204.1 kg    BMI:  Body mass index is 85.03 kg/m.  Estimated Nutritional Needs:   Kcal:  1250-1450  -height discrepancy in chart  Protein:  >119g  Fluid:  2L/day  10/22/21, MS, RD, LDN Inpatient Clinical Dietitian Contact information available via Amion

## 2021-10-20 NOTE — Progress Notes (Addendum)
[place] Progress Note Patient Name: Felicia Warren DOB: [birth year] MRN: [id number]   Date of Service  10/20/2021  HPI/Events of Note  Notified of ABG results - 7.13/102/113 despite changing BIPAP settings.   eICU Interventions  Ground team made aware of worsening respiratory acidosis and need for intubation.   Intubation kit at the bedside.     Intervention Category Intermediate Interventions: Other:  Elsie Lincoln 10/20/2021, 5:17 AM

## 2021-10-21 ENCOUNTER — Other Ambulatory Visit: Payer: Self-pay

## 2021-10-21 DIAGNOSIS — E662 Morbid (severe) obesity with alveolar hypoventilation: Secondary | ICD-10-CM

## 2021-10-21 DIAGNOSIS — E119 Type 2 diabetes mellitus without complications: Secondary | ICD-10-CM | POA: Diagnosis not present

## 2021-10-21 DIAGNOSIS — J9602 Acute respiratory failure with hypercapnia: Secondary | ICD-10-CM | POA: Diagnosis not present

## 2021-10-21 DIAGNOSIS — N390 Urinary tract infection, site not specified: Secondary | ICD-10-CM | POA: Diagnosis present

## 2021-10-21 DIAGNOSIS — I5033 Acute on chronic diastolic (congestive) heart failure: Secondary | ICD-10-CM | POA: Diagnosis not present

## 2021-10-21 LAB — GLUCOSE, CAPILLARY
Glucose-Capillary: 146 mg/dL — ABNORMAL HIGH (ref 70–99)
Glucose-Capillary: 149 mg/dL — ABNORMAL HIGH (ref 70–99)
Glucose-Capillary: 150 mg/dL — ABNORMAL HIGH (ref 70–99)
Glucose-Capillary: 153 mg/dL — ABNORMAL HIGH (ref 70–99)
Glucose-Capillary: 165 mg/dL — ABNORMAL HIGH (ref 70–99)
Glucose-Capillary: 167 mg/dL — ABNORMAL HIGH (ref 70–99)

## 2021-10-21 LAB — RENAL FUNCTION PANEL
Albumin: 3.2 g/dL — ABNORMAL LOW (ref 3.5–5.0)
Anion gap: 14 (ref 5–15)
BUN: 37 mg/dL — ABNORMAL HIGH (ref 6–20)
CO2: 28 mmol/L (ref 22–32)
Calcium: 8.3 mg/dL — ABNORMAL LOW (ref 8.9–10.3)
Chloride: 105 mmol/L (ref 98–111)
Creatinine, Ser: 1.95 mg/dL — ABNORMAL HIGH (ref 0.44–1.00)
GFR, Estimated: 30 mL/min — ABNORMAL LOW (ref >60)
Glucose, Bld: 145 mg/dL — ABNORMAL HIGH (ref 70–99)
Phosphorus: 3.4 mg/dL (ref 2.5–4.6)
Potassium: 4.1 mmol/L (ref 3.5–5.1)
Sodium: 147 mmol/L — ABNORMAL HIGH (ref 135–145)

## 2021-10-21 LAB — HEPATIC FUNCTION PANEL
ALT: 108 U/L — ABNORMAL HIGH (ref 0–44)
AST: 199 U/L — ABNORMAL HIGH (ref 15–41)
Albumin: 3.1 g/dL — ABNORMAL LOW (ref 3.5–5.0)
Alkaline Phosphatase: 61 U/L (ref 38–126)
Bilirubin, Direct: 1 mg/dL — ABNORMAL HIGH (ref 0.0–0.2)
Indirect Bilirubin: 1.1 mg/dL — ABNORMAL HIGH (ref 0.3–0.9)
Total Bilirubin: 2.1 mg/dL — ABNORMAL HIGH (ref 0.3–1.2)
Total Protein: 6.4 g/dL — ABNORMAL LOW (ref 6.5–8.1)

## 2021-10-21 LAB — URINE CULTURE: Culture: 70000 — AB

## 2021-10-21 LAB — MAGNESIUM
Magnesium: 2.3 mg/dL (ref 1.7–2.4)
Magnesium: 2.2 mg/dL (ref 1.7–2.4)
Magnesium: 2.2 mg/dL (ref 1.7–2.4)

## 2021-10-21 LAB — CBC
HCT: 42.1 % (ref 36.0–46.0)
Hemoglobin: 11.5 g/dL — ABNORMAL LOW (ref 12.0–15.0)
MCH: 22.4 pg — ABNORMAL LOW (ref 26.0–34.0)
MCHC: 27.3 g/dL — ABNORMAL LOW (ref 30.0–36.0)
MCV: 82.1 fL (ref 80.0–100.0)
Platelets: 220 10*3/uL (ref 150–400)
RBC: 5.13 MIL/uL — ABNORMAL HIGH (ref 3.87–5.11)
RDW: 21.8 % — ABNORMAL HIGH (ref 11.5–15.5)
WBC: 5.5 10*3/uL (ref 4.0–10.5)
nRBC: 0.5 % — ABNORMAL HIGH (ref 0.0–0.2)

## 2021-10-21 LAB — PHOSPHORUS
Phosphorus: 3.1 mg/dL (ref 2.5–4.6)
Phosphorus: 2.9 mg/dL (ref 2.5–4.6)

## 2021-10-21 LAB — TRIGLYCERIDES: Triglycerides: 157 mg/dL — ABNORMAL HIGH (ref <150)

## 2021-10-21 MED ORDER — PROSOURCE TF20 ENFIT COMPATIBL EN LIQD
60.0000 mL | Freq: Every day | ENTERAL | Status: DC
  Administered 2021-10-21 – 2021-10-22 (×2): 60 mL
  Filled 2021-10-21 (×2): qty 60

## 2021-10-21 MED ORDER — VITAL HIGH PROTEIN PO LIQD
1000.0000 mL | ORAL | Status: DC
  Administered 2021-10-21: 1000 mL
  Administered 2021-10-22: 1 mL

## 2021-10-21 MED ORDER — INSULIN ASPART 100 UNIT/ML IJ SOLN
0.0000 [IU] | INTRAMUSCULAR | Status: DC
  Administered 2021-10-21: 2 [IU] via SUBCUTANEOUS
  Administered 2021-10-21: 3 [IU] via SUBCUTANEOUS
  Administered 2021-10-22: 1 [IU] via SUBCUTANEOUS
  Administered 2021-10-22 (×2): 2 [IU] via SUBCUTANEOUS
  Administered 2021-10-22: 3 [IU] via SUBCUTANEOUS
  Administered 2021-10-22 – 2021-10-23 (×3): 2 [IU] via SUBCUTANEOUS
  Administered 2021-10-24: 3 [IU] via SUBCUTANEOUS
  Administered 2021-10-24: 2 [IU] via SUBCUTANEOUS
  Administered 2021-10-24: 3 [IU] via SUBCUTANEOUS
  Administered 2021-10-25 – 2021-10-26 (×2): 2 [IU] via SUBCUTANEOUS

## 2021-10-21 MED ORDER — FUROSEMIDE 10 MG/ML IJ SOLN
40.0000 mg | Freq: Once | INTRAMUSCULAR | Status: AC
  Administered 2021-10-21: 40 mg via INTRAVENOUS
  Filled 2021-10-21: qty 4

## 2021-10-21 MED ORDER — VITAL HIGH PROTEIN PO LIQD: 1000.0000 mL | ORAL | Status: DC

## 2021-10-21 NOTE — Progress Notes (Signed)
Nutrition Follow-up  DOCUMENTATION CODES:   Morbid obesity  INTERVENTION:   Monitor magnesium, potassium, and phosphorus BID for at least 3 days, MD to replete as needed, as pt is at risk for refeeding syndrome.  -Initiate trickle feeds of Vital HP @ 20 ml/hr via OGT -Once tolerating, recommend advance by 10 ml every 12 hours to  goal rate of 40 ml/hr. -60 ml Prosource TF 20 BID -Goal provides 1120 kcals, 123g protein and 802 ml H2O  NUTRITION DIAGNOSIS:   Inadequate oral intake related to inability to eat as evidenced by NPO status.  Ongoing.  GOAL:   Provide needs based on ASPEN/SCCM guidelines  Not meeting but TF being started today.  MONITOR:   PO intake, Supplement acceptance, Labs, Weight trends, I & O's  REASON FOR ASSESSMENT:   Consult Enteral/tube feeding initiation and management  ASSESSMENT:   51 yo female with pmh dm2, hfpEF, chronic osa non adherent with home niv. Pt has  been living in [address] with failure to thrive for approximately 3 weeks per EDP report.  Patient's sister called police to perform welfare check and found pt confused in room covered in feces and urine.   In ER, she was drowsy found to be hypercarbic with AKI, acute liver injury, and elevated bnp/trop with known hfpef.  Patient remains intubated. MD consulted for trickle feeds to be started today.  Patient is currently intubated on ventilator support MV: 7.4 L/min Temp (24hrs), Avg:99.4 F (37.4 C), Min:98.6 F (37 C), Max:99.7 F (37.6 C)  Propofol: 12.25 ml/hr -provides ~ 323 fat kcals  Medications: Colace, Lasix, Miralax, Fentanyl  Labs reviewed: CBGs: 142-167 Elevated Na TG 157   Diet Order:   Diet Order             Diet NPO time specified  Diet effective now                   EDUCATION NEEDS:   Not appropriate for education at this time  Skin:  Skin Assessment: Skin Integrity Issues: Skin Integrity Issues:: Other (Comment) Other: MASD back skin  folds  Last BM:  PTA  Height:   Ht Readings from Last 1 Encounters:  10/20/21 5\' 1"  (1.549 m)    Weight:   Wt Readings from Last 1 Encounters:  10/21/21 (!) 204.1 kg    BMI:  Body mass index is 85.03 kg/m.  Estimated Nutritional Needs:   Kcal:  1250-1450  -height discrepancy in chart  Protein:  >119g  Fluid:  2L/day   10/23/21, MS, RD, LDN Inpatient Clinical Dietitian Contact information available via Amion

## 2021-10-21 NOTE — Progress Notes (Addendum)
NAME:  Felicia Warren, MRN:  [id number], DOB:  [birth year], LOS: 2 ADMISSION DATE:  10/19/2021, CONSULTATION DATE:  10/19/21 REFERRING MD:  EDP, CHIEF COMPLAINT:  acute hypercarbic resp failure   History of Present Illness:  51 yo female with pmh dm2, hfpEF, chronic osa non adherent with home niv. Pt has  been living in [address] with failure to thrive for approximately 3 weeks per EDP report.  Patient's sister called police to perform welfare check and found pt confused in room covered in feces and urine.   In ER, she was drowsy found to be hypercarbic with AKI, acute liver injury, and elevated bnp/trop with known hfpef.  She was placed on niv and admitted to [facility].    Pertinent  Medical History  HFpEF Morbid obesity PE Normocytic anemia OSA non adherent to bipap at home.   Significant Hospital Events: Including procedures, antibiotic start and stop dates in addition to other pertinent events   8/28: admitted to ICU 2/2 resp failure 8/29 failed NIV, intubated   Interim History / Subjective:  Intubated yesterday. No acute events overnight.   Objective   Blood pressure (!) 101/47, pulse 87, temperature 99.5 F (37.5 C), temperature source Bladder, resp. rate 18, height 5\' 1"  (1.549 m), weight (!) 204.1 kg, SpO2 95 %.    Vent Mode: PRVC FiO2 (%):  [50 %-100 %] 50 % Set Rate:  [20 bmp-30 bmp] 20 bmp Vt Set:  [380 mL-400 mL] 380 mL PEEP:  [8 cmH20] 8 cmH20 Plateau Pressure:  [21 cmH20-23 cmH20] 22 cmH20   Intake/Output Summary (Last 24 hours) at 10/21/2021 10/23/2021 Last data filed at 10/21/2021 0600 Gross per 24 hour  Intake 607.58 ml  Output 975 ml  Net -367.42 ml    Filed Weights   10/20/21 0700 10/21/21 0339  Weight: (!) 204.1 kg (!) 204.1 kg    Examination: General:  Morbidly obese female in NAD on vent HEENT: [place]/AT, PERRL, unable to appreciate JVD Neuro:  RASS -3 CV: RRR, no MRG PULM:  Clear bilateral breath sounds. Vent assisted breaths.  GI: Soft, NT, ND Extremities: No  acute deformity  Skin: no rashes   Echo 8/29 LVEF 60-65%, grade 2 DD.  LE dopplers 8/29 No DVT.    Resolved Hospital Problem list   Lactic acidosis   Assessment & Plan:  Acute on chronic hypercarbic resp failure Possible AECOPD ? Pulmonary edema. BNP up from baseline CXR consistent with mild edema.  OSA/ OHS non-compliant with home NIV - Full vent support - SBT today - VAP prevention protocol/ PPI - PAD protocol for sedation> propofol/ prn fentanyl, RASS goal 0/-1 (TG 157) - wean FiO2 as able for SpO2 >88-95%  - Steroids off  Acute on chronic HFpEF: echo with normal EF and grade 2 DD.  Elevated troponin  - Tele - Holding home torsemide. IV diuresis  AKI with oliguria - baseline Cr 0.7 - sCr still elevated continued to trend down - continue diuresis - strict I/Os - Foley  Suspected UTI  - pending UC - continue empiric ceftriaxone for now, low threshold to DC  Transaminitis - trending up - recheck LFTs in am, ? Congestion - ammonia 23 - if continue to rise, consider RUQ 9/29  DM2 - CBG monitoring and SSI  Hx LLL PE 08/2020: not on AC. Dopplers negative. Echo negative for RV strain.  - supportive care - VTE ppx  Nutrition: - If unable to extubate today we will plan for TF  Other> family/ sister  Arline Asp voices concern over her sister's ability to take care of herself.  She is a very private person and rarely reaches out, except for money at times.  Arline Asp reports she signed herself out of SNF/ rehab in the spring and "disappeared".  Back in April, she was IVC'd by her family for self neglect and care concerns.  Evaluated by psych then, found to not have any psych issues.   Admitted 06/2020 as Felicia Warren for several days until family reached with similar presentation of non-compliance, self neglect, but required intubation that admission for resp failure.  Arline Asp is working on/ interested in getting guardianship over her sister.    Best Practice (right click and "Reselect all  SmartList Selections" daily)   Diet/type: NPO DVT prophylaxis: SCD / heparin SQ for now GI prophylaxis: PPI Lines: N/A Foley:  Yes, and it is still needed Code Status:  full code Last date of multidisciplinary goals of care discussion [with sister Mrs, [name], awaiting call back after speaking with siblings. ]   Labs   CBC: Recent Labs  Lab 10/19/21 1836 10/20/21 0247 10/21/21 0259  WBC 9.7 8.1 5.5  NEUTROABS 6.8  --   --   HGB 12.8 12.9 11.5*  HCT 48.5* 50.2* 42.1  MCV 84.3 87.2 82.1  PLT 308 261 220     Basic Metabolic Panel: Recent Labs  Lab 10/19/21 1836 10/20/21 0247 10/21/21 0258 10/21/21 0259  NA 146* 145 147*  --   K 4.2 4.5 4.1  --   CL 102 102 105  --   CO2 30 29 28   --   GLUCOSE 100* 111* 145*  --   BUN 27* 28* 37*  --   CREATININE 2.38* 2.20* 1.95*  --   CALCIUM 8.5* 8.2* 8.3*  --   MG  --  2.3  --  2.3  PHOS  --  6.0* 3.4  --     GFR: Estimated Creatinine Clearance: 58.1 mL/min (A) (by C-G formula based on SCr of 1.95 mg/dL (H)). Recent Labs  Lab 10/19/21 1836 10/19/21 1837 10/19/21 2040 10/20/21 0247 10/21/21 0259  WBC 9.7  --   --  8.1 5.5  LATICACIDVEN  --  2.2* 1.6  --   --      Liver Function Tests: Recent Labs  Lab 10/19/21 1836 10/21/21 0258 10/21/21 0259  AST 127*  --  199*  ALT 65*  --  108*  ALKPHOS 80  --  61  BILITOT 4.3*  --  2.1*  PROT 7.8  --  6.4*  ALBUMIN 4.0 3.2* 3.1*    No results for input(s): "LIPASE", "AMYLASE" in the last 168 hours. Recent Labs  Lab 10/19/21 1838  AMMONIA 23     ABG    Component Value Date/Time   PHART 7.46 (H) 10/20/2021 1041   PCO2ART 43 10/20/2021 1041   PO2ART 80 (L) 10/20/2021 1041   HCO3 30.6 (H) 10/20/2021 1041   TCO2 35 (H) 06/25/2020 2043   O2SAT 96.9 10/20/2021 1041     Coagulation Profile: No results for input(s): "INR", "PROTIME" in the last 168 hours.  Cardiac Enzymes: No results for input(s): "CKTOTAL", "CKMB", "CKMBINDEX", "TROPONINI" in the last 168  hours.  HbA1C: Hgb A1c MFr Bld  Date/Time Value Ref Range Status  06/18/2021 04:29 AM 6.2 (H) 4.8 - 5.6 % Final    Comment:    (NOTE) Pre diabetes:          5.7%-6.4%  Diabetes:              >  6.4%  Glycemic control for   <7.0% adults with diabetes   06/26/2020 03:20 AM 6.3 (H) 4.8 - 5.6 % Final    Comment:    (NOTE) Pre diabetes:          5.7%-6.4%  Diabetes:              >6.4%  Glycemic control for   <7.0% adults with diabetes     CBG: Recent Labs  Lab 10/20/21 1127 10/20/21 1512 10/20/21 1947 10/20/21 2328 10/21/21 0414  GLUCAP 120* 142* 145* 160* 165*     Critical care time: 39 minutes necessary due to acute on chronic hypercarbia requiring vent.       Joneen Roach, AGACNP-BC [facility]  See Amion for personal pager PCCM on call pager [phone] until 7pm. Please call Elink 7p-7a. [phone]  10/21/2021 7:22 AM

## 2021-10-21 NOTE — Progress Notes (Signed)
Spoke to patients sister Arline Asp concerning patient placement once discharged. Patient before admission per family was unable to properly care for herself, and signs herself out of rehab before she is ready. Patient had been living at a motel that family was paying for prior to admit. Family would like information on assisting the patient in applying for government financial assistance, as well as information concerning medical power of attorney. I was unable to speak to social work when they stopped by. Family would appreciate call or scheduled meeting concerning above mentioned things so that they can complete necessary paperwork prior to patient discharge. Will pass on to night shift nurse so that this is something that can potentially be addressed tomorrow.

## 2021-10-21 NOTE — Progress Notes (Signed)
RT placed bite block at 0903. Pt does move her tongue when you mess with her mouth. RT will continue to monitor

## 2021-10-21 NOTE — TOC Progression Note (Signed)
Transition of Care [facility]) - Progression Note    Patient Details  Name: Felicia Warren MRN: [id number] Date of Birth: 07-Nov-1970  Transition of Care [facility]) CM/SW Contact  Geni Bers, RN Phone Number: 10/21/2021, 11:27 AM  Clinical Narrative:    Pt on Vent at present time. TOC will continue to follow for discharge plan.    Expected Discharge Plan: Homeless Shelter Barriers to Discharge: No Barriers Identified  Expected Discharge Plan and Services Expected Discharge Plan: Homeless Shelter       Living arrangements for the past 2 months: Homeless                                       Social Determinants of Health (SDOH) Interventions    Readmission Risk Interventions     No data to display

## 2021-10-22 ENCOUNTER — Inpatient Hospital Stay (HOSPITAL_COMMUNITY): Payer: Medicaid Other

## 2021-10-22 DIAGNOSIS — I5033 Acute on chronic diastolic (congestive) heart failure: Secondary | ICD-10-CM | POA: Diagnosis not present

## 2021-10-22 DIAGNOSIS — N390 Urinary tract infection, site not specified: Secondary | ICD-10-CM | POA: Diagnosis not present

## 2021-10-22 DIAGNOSIS — E119 Type 2 diabetes mellitus without complications: Secondary | ICD-10-CM | POA: Diagnosis not present

## 2021-10-22 DIAGNOSIS — J9602 Acute respiratory failure with hypercapnia: Secondary | ICD-10-CM | POA: Diagnosis not present

## 2021-10-22 LAB — BLOOD GAS, ARTERIAL
Acid-Base Excess: 12.2 mmol/L — ABNORMAL HIGH (ref 0.0–2.0)
Bicarbonate: 38.2 mmol/L — ABNORMAL HIGH (ref 20.0–28.0)
FIO2: 40 %
MECHVT: 380 mL
O2 Saturation: 96.1 %
PEEP: 8 cmH2O
Patient temperature: 37.3
RATE: 20 resp/min
pCO2 arterial: 56 mmHg — ABNORMAL HIGH (ref 32–48)
pH, Arterial: 7.45 (ref 7.35–7.45)
pO2, Arterial: 73 mmHg — ABNORMAL LOW (ref 83–108)

## 2021-10-22 LAB — GLUCOSE, CAPILLARY
Glucose-Capillary: 131 mg/dL — ABNORMAL HIGH (ref 70–99)
Glucose-Capillary: 96 mg/dL (ref 70–99)
Glucose-Capillary: 153 mg/dL — ABNORMAL HIGH (ref 70–99)
Glucose-Capillary: 140 mg/dL — ABNORMAL HIGH (ref 70–99)
Glucose-Capillary: 132 mg/dL — ABNORMAL HIGH (ref 70–99)
Glucose-Capillary: 118 mg/dL — ABNORMAL HIGH (ref 70–99)
Glucose-Capillary: 105 mg/dL — ABNORMAL HIGH (ref 70–99)

## 2021-10-22 LAB — CBC
HCT: 41.4 % (ref 36.0–46.0)
Hemoglobin: 11.5 g/dL — ABNORMAL LOW (ref 12.0–15.0)
MCH: 22.5 pg — ABNORMAL LOW (ref 26.0–34.0)
MCHC: 27.8 g/dL — ABNORMAL LOW (ref 30.0–36.0)
MCV: 81 fL (ref 80.0–100.0)
Platelets: 190 10*3/uL (ref 150–400)
RBC: 5.11 MIL/uL (ref 3.87–5.11)
RDW: 21.9 % — ABNORMAL HIGH (ref 11.5–15.5)
WBC: 5.2 10*3/uL (ref 4.0–10.5)
nRBC: 0.4 % — ABNORMAL HIGH (ref 0.0–0.2)

## 2021-10-22 LAB — COMPREHENSIVE METABOLIC PANEL
ALT: 121 U/L — ABNORMAL HIGH (ref 0–44)
AST: 152 U/L — ABNORMAL HIGH (ref 15–41)
Albumin: 3.3 g/dL — ABNORMAL LOW (ref 3.5–5.0)
Alkaline Phosphatase: 57 U/L (ref 38–126)
Anion gap: 11 (ref 5–15)
BUN: 47 mg/dL — ABNORMAL HIGH (ref 6–20)
CO2: 31 mmol/L (ref 22–32)
Calcium: 8.3 mg/dL — ABNORMAL LOW (ref 8.9–10.3)
Chloride: 105 mmol/L (ref 98–111)
Creatinine, Ser: 1.59 mg/dL — ABNORMAL HIGH (ref 0.44–1.00)
GFR, Estimated: 39 mL/min — ABNORMAL LOW (ref >60)
Glucose, Bld: 147 mg/dL — ABNORMAL HIGH (ref 70–99)
Potassium: 3.8 mmol/L (ref 3.5–5.1)
Sodium: 147 mmol/L — ABNORMAL HIGH (ref 135–145)
Total Bilirubin: 1.6 mg/dL — ABNORMAL HIGH (ref 0.3–1.2)
Total Protein: 6.7 g/dL (ref 6.5–8.1)

## 2021-10-22 LAB — MAGNESIUM
Magnesium: 2.3 mg/dL (ref 1.7–2.4)
Magnesium: 2.4 mg/dL (ref 1.7–2.4)

## 2021-10-22 LAB — PHOSPHORUS
Phosphorus: 2.4 mg/dL — ABNORMAL LOW (ref 2.5–4.6)
Phosphorus: 2.8 mg/dL (ref 2.5–4.6)

## 2021-10-22 MED ORDER — SODIUM CHLORIDE 0.9 % IV SOLN
1.0000 g | INTRAVENOUS | Status: AC
  Administered 2021-10-22: 1 g via INTRAVENOUS
  Filled 2021-10-22: qty 10

## 2021-10-22 MED ORDER — DIPHENHYDRAMINE-ZINC ACETATE 2-0.1 % EX CREA
TOPICAL_CREAM | Freq: Two times a day (BID) | CUTANEOUS | Status: DC | PRN
  Filled 2021-10-22 (×2): qty 28

## 2021-10-22 MED ORDER — POTASSIUM PHOSPHATES 15 MMOLE/5ML IV SOLN
15.0000 mmol | Freq: Once | INTRAVENOUS | Status: AC
  Administered 2021-10-22: 15 mmol via INTRAVENOUS
  Filled 2021-10-22: qty 5

## 2021-10-22 MED ORDER — FUROSEMIDE 10 MG/ML IJ SOLN: 40.0000 mg | Freq: Once | INTRAMUSCULAR | Status: DC

## 2021-10-22 MED ORDER — ORAL CARE MOUTH RINSE: 15.0000 mL | OROMUCOSAL | Status: DC | PRN

## 2021-10-22 MED ORDER — ORAL CARE MOUTH RINSE
15.0000 mL | OROMUCOSAL | Status: DC
  Administered 2021-10-22 – 2021-11-05 (×50): 15 mL via OROMUCOSAL

## 2021-10-22 NOTE — Progress Notes (Signed)
PT placed back on bipap, tolerating well at this time.

## 2021-10-22 NOTE — Progress Notes (Signed)
BSE completed, full report to follow.  Pt surprisingly passed 3 ounce Yale swallow screen when able to be aroused.  She does demonstrate intermittent delay in swallowing and subtle throat clearing after delayed swallow.  No overt indication of aspiration despite sequential swallows of water - 3 ounce water challenge x2.  Pt is dysarthric and thus did not provide solids requiring mastication.  Anticipate swallow to return to normal ability as pt's mentation improves.  Floor stock only clears advised with strict precautions.  Will follow up next date for tolerance, readiness for advancement in diet.  Rolena Infante, MS [facility] SLP Acute Rehab Services Office [phone] Pager [phone]

## 2021-10-22 NOTE — Evaluation (Signed)
Clinical/Bedside Swallow Evaluation Patient Details  Name: Felicia Warren MRN: [id number] Date of Birth: [birth year]  Today's Date: 10/22/2021 Time: SLP Start Time (ACUTE ONLY): 1735 SLP Stop Time (ACUTE ONLY): 1809 SLP Time Calculation (min) (ACUTE ONLY): 34 min  Past Medical History:  Past Medical History:  Diagnosis Date   Acute respiratory failure with hypoxia and hypercarbia (HCC)    AKI (acute kidney injury) (HCC)    Community acquired pneumonia    Demand ischemia (HCC)    Diarrhea    Hypercapnic respiratory failure (HCC) 06/25/2020   Hypoglycemia    Hypokalemia    Morbid obesity (HCC)    Normocytic anemia    OSA treated with BiPAP    Pulmonary embolism (HCC)    Transaminitis    Past Surgical History: History reviewed. No pertinent surgical history. HPI:  51 yo female with pmh dm2, hfpEF, chronic osa non adherent with home niv. [employer], FTT - drowsy found to be hypercarbic with AKI, acute liver injury, and elevated bnp/trop with known HFpef, morbid obesity.   Intubated 8/28, self extubated 8/31. Pt's CXR negative for pna --.  Swallow eval ordered.  Per MD note, pt at high risk of requiring reintubation- RN reports pt wants to eat.    Assessment / Plan / Recommendation  Clinical Impression  Patient's largest barrier to adequate po at this time is her mentation/lethargy.  She also has low vocal intensity and weak volitional cough.  Pt did follow directions for oral motor exam and was noted to be quite dysarthric. Placed bed in reverse trendelenburg position for optimal position for airway safety.  Significant Delay in swallow noted (up to 20 seconds) initially that improved as intake continued.  Subtle throat clearing noted post-swallow of liquids but no overt indication of aspiration. She passed the 3 ounce Yale swallow screen.    Due to pt self extubation possibly contributing to laryngeal/pharyngeal edema, thus impacting airway closure and pharyngeal muscle contraction - recommend  floor stock clear liquids tonight with strict precautions. Medicine with puree. Anticipate rapid resolution of acute dysphagia.  Will follow up next date 10/23/2021 for dysphagia management, readiness for advancement in diet.  Pt educated, RN informed of recommendations and precaution signs posted in pt's room.  Thanks for this consult. SLP Visit Diagnosis: Dysphagia, unspecified (R13.10)    Aspiration Risk  Mild aspiration risk    Diet Recommendation Thin liquid;Other (Comment) (clears floor stock)   Liquid Administration via: Straw Medication Administration: Whole meds with puree Supervision: Full supervision/cueing for compensatory strategies Compensations: Slow rate;Small sips/bites Postural Changes: Seated upright at 90 degrees;Remain upright for at least 30 minutes after po intake    Other  Recommendations Oral Care Recommendations: Oral care BID    Recommendations for follow up therapy are one component of a multi-disciplinary discharge planning process, led by the attending physician.  Recommendations may be updated based on patient status, additional functional criteria and insurance authorization.  Follow up Recommendations No SLP follow up      Assistance Recommended at Discharge None  Functional Status Assessment Patient has had a recent decline in their functional status and demonstrates the ability to make significant improvements in function in a reasonable and predictable amount of time.  Frequency and Duration min 1 x/week  1 week       Prognosis Prognosis for Safe Diet Advancement: Good      Swallow Study   General Date of Onset: 10/22/21 HPI: 51 yo female with pmh dm2, hfpEF, chronic osa non adherent  with home niv. [employer], FTT - drowsy found to be hypercarbic with AKI, acute liver injury, and elevated bnp/trop with known HFpef, morbid obesity.   Intubated 8/28, self extubated 8/31. Pt's CXR negative for pna --.  Swallow eval ordered.  Per MD note, pt at high risk  of requiring reintubation- RN reports pt wants to eat. Type of Study: Bedside Swallow Evaluation Previous Swallow Assessment: 06/2020 after intubation for AMS - found down at hotel - CXR concerning for multifocal pna Diet Prior to this Study: NPO Temperature Spikes Noted: No Respiratory Status: Nasal cannula History of Recent Intubation: Yes Length of Intubations (days): 4 days Date extubated: 10/22/21 (self extubated at approx 1300) Behavior/Cognition: Lethargic/Drowsy;Cooperative;Requires cueing Oral Cavity Assessment: Dry Oral Care Completed by SLP: Yes Oral Cavity - Dentition: Poor condition;Other (Comment) (poor condition, appearance of several caries) Vision: Functional for self-feeding Self-Feeding Abilities: Total assist Patient Positioning: Upright in bed Baseline Vocal Quality: Hoarse;Low vocal intensity Volitional Cough: Weak Volitional Swallow: Able to elicit (with extended time)    Oral/Motor/Sensory Function Overall Oral Motor/Sensory Function: Generalized oral weakness (pt is dysarthric)   Ice Chips Ice chips: Impaired Pharyngeal Phase Impairments: Suspected delayed Swallow   Thin Liquid Thin Liquid: Impaired Presentation: Straw Pharyngeal  Phase Impairments: Throat Clearing - Immediate    Nectar Thick Nectar Thick Liquid: Impaired Presentation: Cup;Straw Pharyngeal Phase Impairments: Suspected delayed Swallow   Honey Thick Honey Thick Liquid: Not tested   Puree Puree: Impaired Presentation: Spoon Pharyngeal Phase Impairments: Suspected delayed Swallow   Solid     Solid: Not tested (due to pt's dysarthria, did not test)      Chales Abrahams 10/22/2021,7:12 PM Rolena Infante, MS [facility] SLP Acute Rehab Services Office [phone] Pager [phone]

## 2021-10-22 NOTE — Progress Notes (Signed)
PCCM INTERVAL PROGRESS NOTE   Self extubated. Had been on an SBT since this morning, but was borderline and still pretty somnolent. Seems to have tolerated extubation so far. Will keep a close eye as she is a risk for re-intubation. PRN BiPAP also an option.    Felicia Warren, AGACNP-BC [facility]  See Amion for personal pager PCCM on call pager [phone] until 7pm. Please call Elink 7p-7a. [phone]  10/22/2021 1:02 PM

## 2021-10-22 NOTE — Progress Notes (Signed)
Per RN pt placed on CPAP 8/+8/40% for wean, Joneen Roach NP. per RN pt tolerating well VT's ranging around 350

## 2021-10-22 NOTE — Progress Notes (Signed)
[facility] requests phone call from hospital social worker before patient discharged.  Contact information for assigned social worker is [place] [phone].  Ulis Rias, RN

## 2021-10-22 NOTE — Progress Notes (Signed)
NAME:  Felicia Warren, MRN:  [id number], DOB:  05/31/1970, LOS: 3 ADMISSION DATE:  10/19/2021, CONSULTATION DATE:  10/19/21 REFERRING MD:  EDP, CHIEF COMPLAINT:  acute hypercarbic resp failure   History of Present Illness:  51 yo female with pmh dm2, hfpEF, chronic osa non adherent with home niv. Pt has  been living in [address] with failure to thrive for approximately 3 weeks per EDP report.  Patient's sister called police to perform welfare check and found pt confused in room covered in feces and urine.   In ER, she was drowsy found to be hypercarbic with AKI, acute liver injury, and elevated bnp/trop with known hfpef.  She was placed on niv and admitted to [facility].    Pertinent  Medical History  HFpEF Morbid obesity PE Normocytic anemia OSA non adherent to bipap at home.   Significant Hospital Events: Including procedures, antibiotic start and stop dates in addition to other pertinent events   8/28: admitted to ICU 2/2 resp failure 8/29 failed NIV, intubated  8/30 did not tolerate wean, LFT slowly rising.   Interim History / Subjective:  Unable to wean yesterday. No acute events overnight. Diuresed well 1600 mL out last 24 hours.   Objective   Blood pressure (!) 101/53, pulse 76, temperature 98.6 F (37 C), resp. rate 20, height 5\' 1"  (1.549 m), weight (!) 203.2 kg, SpO2 97 %.    Vent Mode: PRVC FiO2 (%):  [40 %-50 %] 40 % Set Rate:  [20 bmp] 20 bmp Vt Set:  [380 mL] 380 mL PEEP:  [8 cmH20] 8 cmH20 Plateau Pressure:  [23 cmH20-24 cmH20] 23 cmH20   Intake/Output Summary (Last 24 hours) at 10/22/2021 0708 Last data filed at 10/22/2021 10/24/2021 Gross per 24 hour  Intake 677.01 ml  Output 1600 ml  Net -922.99 ml    Filed Weights   10/20/21 0700 10/21/21 0339 10/22/21 0452  Weight: (!) 204.1 kg (!) 204.1 kg (!) 203.2 kg    Examination:  General:  Morbidly obese female on vent HEENT: [place]/AT, PERRL, no JVD. Poor dentition.  Neuro:  RASS -1.  CV: RRR, no MRG. No obvious edema.  PULM:   Clear bilateral breath sounds. Scant secretions.  GI: Soft, NT, ND Extremities: No acute deformity Skin: no rashes   Echo 8/29 LVEF 60-65%, grade 2 DD.  LE dopplers 8/29 No DVT.    RUQ 9/29 >>>  Resolved Hospital Problem list   Lactic acidosis   Assessment & Plan:  Acute on chronic hypercarbic resp failure  Pulmonary edema. BNP up from baseline, CXR consistent with mild edema.  Decompensated OSA/OHS non-compliant with home NIV Possible AECOPD > less likely - SBT today, hopeful for extubation. Consider extubate to BiPAP - VAP prevention protocol/ PPI - PAD protocol for sedation> Fentanyl > off 0715. Would start precedex if needed to facilitate weaning.  - wean FiO2 as able for SpO2 >88-95%  - PRN albuterol - Resume QHS CPAP once extubated. I wonder if she would benefit/qualify for NIV when the time comes.   Acute on chronic HFpEF: echo with normal EF and grade 2 DD.  Elevated troponin  - Tele - Holding home torsemide. IV diuresis. - BUN bump today, so will hold diuresis.   AKI with oliguria - baseline Cr 0.7 - sCr still elevated, continued to trend down - strict I/Os - Foley, consider DC today  Suspected UTI  - pending UC > 70K colonies lactobacillus  - DC CTX after today's dose  Transaminitis -  still trending up, mild - Check RUQ [place]  - repeat ammonia in AM  DM2 - CBG monitoring and SSI  Hx LLL PE 08/2020: not on AC. Dopplers negative. Echo negative for RV strain.  - supportive care - VTE ppx  Nutrition: - TF on hold for possible extubation.   Other> family/ sister Arline Asp voices concern over her sister's ability to take care of herself.  She is a very private person and rarely reaches out, except for money at times.  Arline Asp reports she signed herself out of SNF/ rehab in the spring and "disappeared".  Back in April, she was IVC'd by her family for self neglect and care concerns.  Evaluated by psych then, found to not have any psych issues.   Admitted 06/2020 as [facility] Squib[facility]  D[facility] for several days until family reached with similar presentation of non-compliance, self neglect, but required intubation that admission for resp failure.  Arline Asp is working on/ interested in getting guardianship over her sister.    Best Practice (right click and "Reselect all SmartList Selections" daily)   Diet/type: NPO  DVT prophylaxis: SCD / heparin SQ for now GI prophylaxis: PPI Lines: N/A Foley:  Yes, and it is still needed Code Status:  full code Last date of multidisciplinary goals of care discussion [with sister Mrs, [name], awaiting call back after speaking with siblings. ]   Labs   CBC: Recent Labs  Lab 10/19/21 1836 10/20/21 0247 10/21/21 0259 10/22/21 0307  WBC 9.7 8.1 5.5 5.2  NEUTROABS 6.8  --   --   --   HGB 12.8 12.9 11.5* 11.5*  HCT 48.5* 50.2* 42.1 41.4  MCV 84.3 87.2 82.1 81.0  PLT 308 261 220 190     Basic Metabolic Panel: Recent Labs  Lab 10/19/21 1836 10/20/21 0247 10/21/21 0258 10/21/21 0259 10/21/21 1306 10/21/21 1642 10/22/21 0307  NA 146* 145 147*  --   --   --  147*  K 4.2 4.5 4.1  --   --   --  3.8  CL 102 102 105  --   --   --  105  CO2 30 29 28   --   --   --  31  GLUCOSE 100* 111* 145*  --   --   --  147*  BUN 27* 28* 37*  --   --   --  47*  CREATININE 2.38* 2.20* 1.95*  --   --   --  1.59*  CALCIUM 8.5* 8.2* 8.3*  --   --   --  8.3*  MG  --  2.3  --  2.3 2.2 2.2 2.3  PHOS  --  6.0* 3.4  --  3.1 2.9 2.4*    GFR: Estimated Creatinine Clearance: 71.1 mL/min (A) (by C-G formula based on SCr of 1.59 mg/dL (H)). Recent Labs  Lab 10/19/21 1836 10/19/21 1837 10/19/21 2040 10/20/21 0247 10/21/21 0259 10/22/21 0307  WBC 9.7  --   --  8.1 5.5 5.2  LATICACIDVEN  --  2.2* 1.6  --   --   --      Liver Function Tests: Recent Labs  Lab 10/19/21 1836 10/21/21 0258 10/21/21 0259 10/22/21 0307  AST 127*  --  199* 152*  ALT 65*  --  108* 121*  ALKPHOS 80  --  61 57  BILITOT 4.3*  --  2.1* 1.6*  PROT 7.8  --  6.4* 6.7   ALBUMIN 4.0 3.2* 3.1* 3.3*    No results for  input(s): "LIPASE", "AMYLASE" in the last 168 hours. Recent Labs  Lab 10/19/21 1838  AMMONIA 23     ABG    Component Value Date/Time   PHART 7.45 10/22/2021 0340   PCO2ART 56 (H) 10/22/2021 0340   PO2ART 73 (L) 10/22/2021 0340   HCO3 38.2 (H) 10/22/2021 0340   TCO2 35 (H) 06/25/2020 2043   O2SAT 96.1 10/22/2021 0340     Coagulation Profile: No results for input(s): "INR", "PROTIME" in the last 168 hours.  Cardiac Enzymes: No results for input(s): "CKTOTAL", "CKMB", "CKMBINDEX", "TROPONINI" in the last 168 hours.  HbA1C: Hgb A1c MFr Bld  Date/Time Value Ref Range Status  06/18/2021 04:29 AM 6.2 (H) 4.8 - 5.6 % Final    Comment:    (NOTE) Pre diabetes:          5.7%-6.4%  Diabetes:              >6.4%  Glycemic control for   <7.0% adults with diabetes   06/26/2020 03:20 AM 6.3 (H) 4.8 - 5.6 % Final    Comment:    (NOTE) Pre diabetes:          5.7%-6.4%  Diabetes:              >6.4%  Glycemic control for   <7.0% adults with diabetes     CBG: Recent Labs  Lab 10/21/21 1537 10/21/21 1946 10/21/21 2344 10/22/21 0357 10/22/21 0359  GLUCAP 146* 153* 149* 96 131*     Critical care time: 39 minutes necessary due to acute on chronic hypercarbia requiring vent.       Georgann Housekeeper, AGACNP-BC  Pulmonary & Critical Care  See Amion for personal pager PCCM on call pager [phone] until 7pm. Please call Elink 7p-7a. [phone]  10/22/2021 7:08 AM

## 2021-10-23 DIAGNOSIS — J9602 Acute respiratory failure with hypercapnia: Secondary | ICD-10-CM | POA: Diagnosis not present

## 2021-10-23 DIAGNOSIS — E662 Morbid (severe) obesity with alveolar hypoventilation: Secondary | ICD-10-CM | POA: Diagnosis not present

## 2021-10-23 LAB — CBC
HCT: 42.4 % (ref 36.0–46.0)
HCT: 41.8 % (ref 36.0–46.0)
Hemoglobin: 11.4 g/dL — ABNORMAL LOW (ref 12.0–15.0)
Hemoglobin: 11.5 g/dL — ABNORMAL LOW (ref 12.0–15.0)
MCH: 22.4 pg — ABNORMAL LOW (ref 26.0–34.0)
MCH: 22.6 pg — ABNORMAL LOW (ref 26.0–34.0)
MCHC: 26.9 g/dL — ABNORMAL LOW (ref 30.0–36.0)
MCHC: 27.5 g/dL — ABNORMAL LOW (ref 30.0–36.0)
MCV: 83.1 fL (ref 80.0–100.0)
MCV: 82.3 fL (ref 80.0–100.0)
Platelets: 151 10*3/uL (ref 150–400)
Platelets: 160 10*3/uL (ref 150–400)
RBC: 5.1 MIL/uL (ref 3.87–5.11)
RBC: 5.08 MIL/uL (ref 3.87–5.11)
RDW: 21.6 % — ABNORMAL HIGH (ref 11.5–15.5)
RDW: 21.8 % — ABNORMAL HIGH (ref 11.5–15.5)
WBC: 5.4 10*3/uL (ref 4.0–10.5)
WBC: 5.3 10*3/uL (ref 4.0–10.5)
nRBC: 0 % (ref 0.0–0.2)
nRBC: 0 % (ref 0.0–0.2)

## 2021-10-23 LAB — GLUCOSE, CAPILLARY
Glucose-Capillary: 107 mg/dL — ABNORMAL HIGH (ref 70–99)
Glucose-Capillary: 94 mg/dL (ref 70–99)
Glucose-Capillary: 89 mg/dL (ref 70–99)
Glucose-Capillary: 124 mg/dL — ABNORMAL HIGH (ref 70–99)
Glucose-Capillary: 133 mg/dL — ABNORMAL HIGH (ref 70–99)

## 2021-10-23 LAB — COMPREHENSIVE METABOLIC PANEL
ALT: 103 U/L — ABNORMAL HIGH (ref 0–44)
AST: 92 U/L — ABNORMAL HIGH (ref 15–41)
Albumin: 3.2 g/dL — ABNORMAL LOW (ref 3.5–5.0)
Alkaline Phosphatase: 55 U/L (ref 38–126)
Anion gap: 7 (ref 5–15)
BUN: 38 mg/dL — ABNORMAL HIGH (ref 6–20)
CO2: 34 mmol/L — ABNORMAL HIGH (ref 22–32)
Calcium: 8 mg/dL — ABNORMAL LOW (ref 8.9–10.3)
Chloride: 106 mmol/L (ref 98–111)
Creatinine, Ser: 1.08 mg/dL — ABNORMAL HIGH (ref 0.44–1.00)
GFR, Estimated: >60 mL/min (ref >60)
Glucose, Bld: 106 mg/dL — ABNORMAL HIGH (ref 70–99)
Potassium: 3.6 mmol/L (ref 3.5–5.1)
Sodium: 147 mmol/L — ABNORMAL HIGH (ref 135–145)
Total Bilirubin: 1.7 mg/dL — ABNORMAL HIGH (ref 0.3–1.2)
Total Protein: 6.6 g/dL (ref 6.5–8.1)

## 2021-10-23 LAB — PROTIME-INR
INR: 1.3 — ABNORMAL HIGH (ref 0.8–1.2)
Prothrombin Time: 16.3 seconds — ABNORMAL HIGH (ref 11.4–15.2)

## 2021-10-23 LAB — AMMONIA: Ammonia: 31 umol/L (ref 9–35)

## 2021-10-23 MED ORDER — ADULT MULTIVITAMIN W/MINERALS CH
1.0000 | ORAL_TABLET | Freq: Every day | ORAL | Status: DC
  Administered 2021-10-23 – 2021-11-10 (×19): 1 via ORAL
  Filled 2021-10-23 (×19): qty 1

## 2021-10-23 MED ORDER — CALCIUM GLUCONATE-NACL 1-0.675 GM/50ML-% IV SOLN
1.0000 g | Freq: Once | INTRAVENOUS | Status: AC
Start: 2021-10-23 — End: 2021-10-23
  Administered 2021-10-23: 1000 mg via INTRAVENOUS
  Filled 2021-10-23: qty 50

## 2021-10-23 MED ORDER — PANTOPRAZOLE SODIUM 40 MG PO TBEC
40.0000 mg | DELAYED_RELEASE_TABLET | Freq: Every day | ORAL | Status: DC
  Administered 2021-10-23: 40 mg via ORAL
  Filled 2021-10-23: qty 1

## 2021-10-23 MED ORDER — RIVAROXABAN 10 MG PO TABS
10.0000 mg | ORAL_TABLET | Freq: Every day | ORAL | Status: DC
  Administered 2021-10-23 – 2021-11-27 (×36): 10 mg via ORAL
  Filled 2021-10-23 (×37): qty 1

## 2021-10-23 MED ORDER — PROSOURCE PLUS PO LIQD
30.0000 mL | Freq: Two times a day (BID) | ORAL | Status: DC
  Administered 2021-10-23 – 2021-11-27 (×17): 30 mL via ORAL
  Filled 2021-10-23 (×31): qty 30

## 2021-10-23 MED ORDER — ENSURE MAX PROTEIN PO LIQD
11.0000 [oz_av] | Freq: Two times a day (BID) | ORAL | Status: DC
  Administered 2021-10-23 – 2021-11-27 (×48): 11 [oz_av] via ORAL
  Filled 2021-10-23 (×72): qty 330

## 2021-10-23 MED ORDER — POTASSIUM CHLORIDE 10 MEQ/100ML IV SOLN
10.0000 meq | INTRAVENOUS | Status: AC
  Administered 2021-10-23 (×4): 10 meq via INTRAVENOUS
  Filled 2021-10-23 (×4): qty 100

## 2021-10-23 MED ORDER — POLYETHYLENE GLYCOL 3350 17 G PO PACK
17.0000 g | PACK | Freq: Every day | ORAL | Status: DC
  Administered 2021-10-23: 17 g via ORAL
  Filled 2021-10-23: qty 1

## 2021-10-23 MED ORDER — DOCUSATE SODIUM 100 MG PO CAPS
100.0000 mg | ORAL_CAPSULE | Freq: Two times a day (BID) | ORAL | Status: DC
  Administered 2021-10-23: 100 mg via ORAL
  Filled 2021-10-23: qty 1

## 2021-10-23 NOTE — Progress Notes (Signed)
Pt set up on bipap. Pt is not ready to wear it yet. No resp distress noted.

## 2021-10-23 NOTE — Progress Notes (Signed)
Nutrition Follow-up  DOCUMENTATION CODES:   Morbid obesity  INTERVENTION:   -Ensure MAX Protein po BID, each supplement provides 150 kcal and 30 grams of protein   -Prosource Plus PO BID, each provides 100 kcals and 15g protein  -Multivitamin with minerals daily  NUTRITION DIAGNOSIS:   Inadequate oral intake related to inability to eat as evidenced by NPO status.  Now on CHO modified diet.  GOAL:   Patient will meet greater than or equal to 90% of their needs  Not meeting.  MONITOR:   PO intake, Supplement acceptance, Labs, Weight trends, I & O's   ASSESSMENT:   51 yo female with pmh dm2, hfpEF, chronic osa non adherent with home niv. Pt has  been living in [address] with failure to thrive for approximately 3 weeks per EDP report.  Patient's sister called police to perform welfare check and found pt confused in room covered in feces and urine.   In ER, she was drowsy found to be hypercarbic with AKI, acute liver injury, and elevated bnp/trop with known hfpef.  8/28: admitted 8/29: intubated 8/31: extubated  Patient self extubated 8/31. SLP evaluating, now on CHO modified diet. Will order Ensure Max, Prosource Plus and daily MVI given likely poor nutrition PTA. Pt only received trickle tube feeds while on vent.   Admission weight: 450 lbs Current weight: 448 lbs  Medications: Colace, Miralax, KCl  Labs reviewed: CBGs: 94-153 Elevated Na  Diet Order:   Diet Order             Diet Carb Modified Fluid consistency: Thin; Room service appropriate? Yes  Diet effective now                   EDUCATION NEEDS:   Not appropriate for education at this time  Skin:  Skin Assessment: Skin Integrity Issues: Skin Integrity Issues:: Other (Comment) Other: MASD back skin folds  Last BM:  PTA  Height:   Ht Readings from Last 1 Encounters:  10/20/21 5\' 1"  (1.549 m)    Weight:   Wt Readings from Last 1 Encounters:  10/22/21 (!) 203.2 kg    BMI:  Body mass  index is 84.65 kg/m.  Estimated Nutritional Needs:   Kcal:  1450-1650  Protein:  75-90g  Fluid:  1.7L/day   10/24/21, MS, RD, LDN Inpatient Clinical Dietitian Contact information available via Amion

## 2021-10-23 NOTE — Progress Notes (Signed)
Speech Language Pathology Treatment: Dysphagia  Patient Details Name: Felicia Warren MRN: [id number] DOB: [birth year] Today's Date: 10/23/2021 Time: 1005-1015 SLP Time Calculation (min) (ACUTE ONLY): 10 min  Assessment / Plan / Recommendation Clinical Impression  Pt seen for dysphagia management, readiness for po intake. Pt with clear voice and inconsistent throat clear at baseline as well as with po intake.  Occasional eructation noted which pt states is baseline and she denies reflux.  She consumed saltines, applesauce, apple juice at least 8 ounces of water via straw. No indication of aspiration noted during intake nor negative change in vitals. Recommend advance diet to regular/thin, meds as tolerated. No SLP follow up indicated. Educated pt to recommendations/precautions, posted sign, wrote order and provided pt with menu. Thanks for this consult.    HPI HPI: 51 yo female with pmh dm2, hfpEF, chronic osa non adherent with home niv. [address], FTT - drowsy found to be hypercarbic with AKI, acute liver injury, and elevated bnp/trop with known HFpef, morbid obesity.   Intubated 8/28, self extubated 8/31. Pt's CXR negative for pna --.  Swallow eval ordered.  Per MD note, pt at high risk of requiring reintubation- RN reports pt wants to eat.  Pt was sleepy last night but woke adequatley today for self feeding.  Voice is strong and she reports this is normal at this time.  Intermittent throat clearing observed at baseline and with intake.      SLP Plan  All goals met      Recommendations for follow up therapy are one component of a multi-disciplinary discharge planning process, led by the attending physician.  Recommendations may be updated based on patient status, additional functional criteria and insurance authorization.    Recommendations  Diet recommendations: Dysphagia 3 (mechanical soft);Thin liquid Liquids provided via: Straw;Cup Medication Administration: Whole meds with  liquid Compensations: Slow rate;Small sips/bites                Oral Care Recommendations: Oral care BID Follow Up Recommendations: No SLP follow up Assistance recommended at discharge: None SLP Visit Diagnosis: Dysphagia, unspecified (R13.10) Plan: All goals met         Kathleen Lime, MS [facility] SLP Acute Rehab Services Office [phone] Pager [phone]   Macario Golds  10/23/2021, 10:33 AM

## 2021-10-23 NOTE — Progress Notes (Signed)
[facility] ADULT ICU REPLACEMENT PROTOCOL   The patient does apply for the [facility] Adult ICU Electrolyte Replacment Protocol based on the criteria listed below:   1.Exclusion criteria: TCTS patients, ECMO patients, and Dialysis patients 2. Is GFR >/= 30 ml/min? Yes.    Patient's GFR today is >60 3. Is SCr </= 2? Yes.   Patient's SCr is 1.08 mg/dL 4. Did SCr increase >/= 0.5 in 24 hours? No. 5.Pt's weight >40kg  Yes.   6. Abnormal electrolyte(s):   K 3.6  7. Electrolytes replaced per protocol 8.  Call MD STAT for K+ </= 2.5, Phos </= 1, or Mag </= 1 Physician:  S. Bobbye Morton R Janna Oak 10/23/2021 5:25 AM

## 2021-10-23 NOTE — Consult Note (Signed)
Collateral from [facility], she verbalized more concerns with the level of inability to care for herself, confusion, worsening cognitive impairment. She states her sister called her on Saturday and said " I just cant do this anymore." However she would not elaborate what she meant by this statement. She reports this is her sisters 3 rd admission to the hospital, and she continues to refuse care despite severity of medical conditions requiring life support.  Sister provided information leading up to this hospitalization. She reports her sister has signed herself out of the nursing facility AMA, and returned to the hotel. Arline Asp further reports that the sister is unable to take care of self, and needed funds to stay in the hotel room. She refused to check out of the hotel, and law enforcement and EMT had to break the lock on the room to access the patient as she refused to open the door. Patient was not using her cpap and had confusion, due to her carbon dioxide build up.  Sister has inquired about guardianship, however is unsure of the next steps to proceed. She has been advised this process may take up to 15-16 days however she needs more of an immediate solution, as she has required medical care including life support.   Support, encouragement, and reassurance offered.  Advice given on completing APS report for lack of self care and negligence.  Review guardianship and what to expect with legal proceedings (capacity vs competency)., emergency petition, and identification of needs in terms of community resources.   Patient to be assessed by psychiatry consult team tomorrow.

## 2021-10-23 NOTE — Evaluation (Signed)
Physical Therapy Evaluation Patient Details Name: Felicia Warren MRN: [id number] DOB: 09/04/1970 Today's Date: 10/23/2021  History of Present Illness  HPI: 51 yo female with pmh dm2, hfpEF, chronic osa non adherent with home niv. [address], FTT - drowsy found to be hypercarbic with AKI, acute liver injury, and elevated bnp/trop with known HFpef, morbid obesity.   Intubated 8/28, self extubated 8/31. Pt's CXR negative for pna  Clinical Impression  The patient is very   lethargic and minimally participating in bed mobility, patient is on a bari air mattress which impedes some mobility. Evaluation limited. Will continue PT  and evaluate needs as patient able. Pt admitted with above diagnosis.  Pt currently with functional limitations due to the deficits listed below (see PT Problem List). Pt will benefit from skilled PT to increase their independence and safety with mobility to allow discharge to the venue listed below.       Recommendations for follow up therapy are one component of a multi-disciplinary discharge planning process, led by the attending physician.  Recommendations may be updated based on patient status, additional functional criteria and insurance authorization.  Follow Up Recommendations Skilled nursing-short term rehab (<3 hours/day) Can patient physically be transported by private vehicle: No    Assistance Recommended at Discharge Frequent or constant Supervision/Assistance  Patient can return home with the following  Two people to help with walking and/or transfers;Two people to help with bathing/dressing/bathroom;Assistance with cooking/housework;Assist for transportation    Equipment Recommendations None recommended by PT  Recommendations for Other Services       Functional Status Assessment Patient has had a recent decline in their functional status and demonstrates the ability to make significant improvements in function in a reasonable and predictable amount of time.      Precautions / Restrictions Precautions Precautions: Fall      Mobility  Bed Mobility Overal bed mobility: Needs Assistance Bed Mobility: Rolling Rolling: Total assist, +2 for physical assistance, +2 for safety/equipment         General bed mobility comments: patient required much cueing to initiate rolling. Patient lethargic and did not participate  farther than roll to her back    Transfers                   General transfer comment: NT    Ambulation/Gait                  Stairs            Wheelchair Mobility    Modified Rankin (Stroke Patients Only)       Balance                                             Pertinent Vitals/Pain      Home Living Family/patient expects to be discharged to:: Unsure                   Additional Comments: Pt unreliable historian.  . Chart states she was staying at hotel    Prior Function Prior Level of Function : Independent/Modified Independent                     Hand Dominance   Dominant Hand: Right    Extremity/Trunk Assessment   Upper Extremity Assessment Upper Extremity Assessment: Generalized weakness    Lower Extremity Assessment Lower Extremity  Assessment: Generalized weakness (difficult to assess, limited patient participation, although RN reports patient had rolled herself to her side, on bari air bed.)    Cervical / Trunk Assessment Cervical / Trunk Assessment:  (body habitus, not able to assess.)  Communication   Communication: No difficulties  Cognition Arousal/Alertness: Lethargic   Overall Cognitive Status: No family/caregiver present to determine baseline cognitive functioning Area of Impairment: Orientation                 Orientation Level: Time, Situation             General Comments: "I don't know why I am here. Oriented to place and date.        General Comments General comments (skin integrity, edema, etc.): unable to  assess    Exercises     Assessment/Plan    PT Assessment Patient needs continued PT services  PT Problem List Decreased strength;Decreased mobility;Decreased safety awareness;Decreased activity tolerance;Decreased knowledge of use of DME;Obesity;Decreased cognition       PT Treatment Interventions DME instruction;Therapeutic activities;Cognitive remediation;Gait training;Therapeutic exercise;Patient/family education;Functional mobility training    PT Goals (Current goals can be found in the Care Plan section)  Acute Rehab PT Goals Patient Stated Goal: None stated PT Goal Formulation: Patient unable to participate in goal setting Time For Goal Achievement: 11/06/21 Potential to Achieve Goals: Fair    Frequency Min 2X/week     Co-evaluation               AM-PAC PT "6 Clicks" Mobility  Outcome Measure Help needed turning from your back to your side while in a flat bed without using bedrails?: Total Help needed moving from lying on your back to sitting on the side of a flat bed without using bedrails?: Total Help needed moving to and from a bed to a chair (including a wheelchair)?: Total Help needed standing up from a chair using your arms (e.g., wheelchair or bedside chair)?: Total Help needed to walk in hospital room?: Total Help needed climbing 3-5 steps with a railing? : Total 6 Click Score: 6    End of Session   Activity Tolerance: Patient limited by fatigue;Patient limited by lethargy Patient left: in bed Nurse Communication: Mobility status;Need for lift equipment PT Visit Diagnosis: Difficulty in walking, not elsewhere classified (R26.2)    Time: 9371-6967 PT Time Calculation (min) (ACUTE ONLY): 32 min   Charges:   PT Evaluation $PT Eval Low Complexity: 1 Low PT Treatments $Therapeutic Activity: 8-22 mins       [name] PT Acute Rehabilitation Services Office [phone] Weekend [phone]  [name] 10/23/2021, 5:29 PM

## 2021-10-23 NOTE — Progress Notes (Signed)
Pt placed on bipap and she is tolerating it well at this time. No resp distress noted. Pt placed on 18/6, 30% to get optimal VT. Pt is resting well at this time. RN aware to place pt on continuous bedside pulse ox.

## 2021-10-23 NOTE — Progress Notes (Signed)
Pt taken off bipap and placed on 3lpm [place], tolerating well, no WOB or distress noted at this time, RT will continue to monitor prn

## 2021-10-23 NOTE — Progress Notes (Signed)
PCCM INTERVAL PROGRESS NOTE  The patient's sister is attempting to obtain guardianship. Psychiatry consult is pending and will likely be completed 9/2.    Joneen Roach, AGACNP-BC [facility]  See Amion for personal pager PCCM on call pager [phone] until 7pm. Please call Elink 7p-7a. [phone]  10/23/2021 5:16 PM

## 2021-10-23 NOTE — Progress Notes (Addendum)
NAME:  Felicia Warren, MRN:  [id number], DOB:  Apr 25, 1970, LOS: 4 ADMISSION DATE:  10/19/2021, CONSULTATION DATE:  10/19/21 REFERRING MD:  EDP, CHIEF COMPLAINT:  acute hypercarbic resp failure   History of Present Illness:  51 yo female with pmh dm2, hfpEF, chronic osa non adherent with home niv. Pt has  been living in [address] with failure to thrive for approximately 3 weeks per EDP report.  Patient's sister called police to perform welfare check and found pt confused in room covered in feces and urine.   In ER, she was drowsy found to be hypercarbic with AKI, acute liver injury, and elevated bnp/trop with known hfpef.  She was placed on niv and admitted to [facility].    Pertinent  Medical History  HFpEF Morbid obesity PE Normocytic anemia OSA non adherent to bipap at home.   Significant Hospital Events: Including procedures, antibiotic start and stop dates in addition to other pertinent events   8/28: admitted to ICU 2/2 resp failure 8/29 failed NIV, intubated  8/30 did not tolerate wean, LFT slowly rising.  8/31 extubated  Interim History / Subjective:  Extubated yesterday. Tolerated well. Did well with BiPAP QHS.   Objective   Blood pressure (!) 121/90, pulse 79, temperature (!) 97.2 F (36.2 C), temperature source Axillary, resp. rate (!) 23, height 5\' 1"  (1.549 m), weight (!) 203.2 kg, SpO2 96 %.    Vent Mode: BIPAP FiO2 (%):  [32 %-40 %] 32 % Set Rate:  [0 bmp-15 bmp] 15 bmp PEEP:  [5 cmH20-8 cmH20] 6 cmH20 Pressure Support:  [5 cmH20-14 cmH20] 12 cmH20 Plateau Pressure:  [20 cmH20] 20 cmH20   Intake/Output Summary (Last 24 hours) at 10/23/2021 1011 Last data filed at 10/23/2021 0800 Gross per 24 hour  Intake 601.31 ml  Output 1400 ml  Net -798.69 ml    Filed Weights   10/20/21 0700 10/21/21 0339 10/22/21 0452  Weight: (!) 204.1 kg (!) 204.1 kg (!) 203.2 kg    Examination:  General:  Morbidly obese female resting comfortably.  HEENT: [place]/AT, PERRL, No JVD Neuro: Alert,  oriented, non-focal. Still some mild confusion as to recent events.  CV: RRR, no MRG PULM:  Distant breath sounds. Clear. No distress. Sats 96% on 3L.  GI: Soft, NT, ND Extremities: No acute deformity Skin: No rash  Echo 8/29 LVEF 60-65%, grade 2 DD.  LE dopplers 8/29 No DVT.    RUQ 9/29 > Fatty liver, cholelithiasis without cholecystitis. Hepatic steatosis.   Resolved Hospital Problem list   Lactic acidosis   Assessment & Plan:  Acute on chronic hypercarbic resp failure  Pulmonary edema. BNP up from baseline, CXR consistent with mild edema.  Decompensated OSA/OHS non-compliant with home NIV Possible AECOPD > less likely - Tolerating extubation quite well - Will continue with BiPAP QHS. With chronically elevated serum bicarb she will meet criteria for home NIV. Will need to arrange for this at discharge.  - wean FiO2 as able for SpO2 >88-95%  - PRN albuterol - Stable for transfer to PCU.   Acute on chronic HFpEF: echo with normal EF and grade 2 DD.  Elevated troponin  - Tele - Holding home torsemide for now.  - Probably restart tomorrow.   AKI with oliguria - baseline Cr 0.7 - sCr continues to trend down - strict I/Os - Foley out  Suspected UTI  - pending UC > 70K colonies lactobacillus  - 3 day course CTX complete.   Transaminitis: hepatic steatosis and cholelithiasis documented  on [place] - Trending down, ammonia 31  DM2 - CBG monitoring and SSI - Advancing diet. Sugars on the low side still, so will defer basal for now  Hx LLL PE 08/2020: not on AC. Dopplers negative. Echo negative for RV strain.  - supportive care - VTE ppx  Nutrition: - carb modified diet  Other> family/ sister Arline Asp voices concern over her sister's ability to take care of herself.  She is a very private person and rarely reaches out, except for money at times.  Arline Asp reports she signed herself out of SNF/ rehab in the spring and "disappeared".  Back in April, she was IVC'd by her family for self  neglect and care concerns.  Evaluated by psych then, found to not have any psych issues.   Admitted 06/2020 as [name] for several days until family reached with similar presentation of non-compliance, self neglect, but required intubation that admission for resp failure.  Arline Asp is working on/ interested in getting guardianship over her sister.    Best Practice (right click and "Reselect all SmartList Selections" daily)   Diet/type: Regular consistency (see orders)  DVT prophylaxis: SCD / heparin SQ for now GI prophylaxis: PPI Lines: N/A Foley:  N/A Code Status:  full code Last date of multidisciplinary goals of care discussion [with sister Mrs, [name], awaiting call back after speaking with siblings. ]   Labs   CBC: Recent Labs  Lab 10/19/21 1836 10/20/21 0247 10/21/21 0259 10/22/21 0307 10/23/21 0241 10/23/21 0409  WBC 9.7 8.1 5.5 5.2 5.4 5.3  NEUTROABS 6.8  --   --   --   --   --   HGB 12.8 12.9 11.5* 11.5* 11.4* 11.5*  HCT 48.5* 50.2* 42.1 41.4 42.4 41.8  MCV 84.3 87.2 82.1 81.0 83.1 82.3  PLT 308 261 220 190 151 160     Basic Metabolic Panel: Recent Labs  Lab 10/19/21 1836 10/20/21 0247 10/20/21 0247 10/21/21 0258 10/21/21 0259 10/21/21 1306 10/21/21 1642 10/22/21 0307 10/22/21 1659 10/23/21 0409  NA 146* 145  --  147*  --   --   --  147*  --  147*  K 4.2 4.5  --  4.1  --   --   --  3.8  --  3.6  CL 102 102  --  105  --   --   --  105  --  106  CO2 30 29  --  28  --   --   --  31  --  34*  GLUCOSE 100* 111*  --  145*  --   --   --  147*  --  106*  BUN 27* 28*  --  37*  --   --   --  47*  --  38*  CREATININE 2.38* 2.20*  --  1.95*  --   --   --  1.59*  --  1.08*  CALCIUM 8.5* 8.2*  --  8.3*  --   --   --  8.3*  --  8.0*  MG  --  2.3   < >  --  2.3 2.2 2.2 2.3 2.4  --   PHOS  --  6.0*   < > 3.4  --  3.1 2.9 2.4* 2.8  --    < > = values in this interval not displayed.    GFR: Estimated Creatinine Clearance: 104.6 mL/min (A) (by C-G formula based on SCr of  1.08 mg/dL (H)). Recent Labs  Lab  10/19/21 1837 10/19/21 2040 10/20/21 0247 10/21/21 0259 10/22/21 0307 10/23/21 0241 10/23/21 0409  WBC  --   --    < > 5.5 5.2 5.4 5.3  LATICACIDVEN 2.2* 1.6  --   --   --   --   --    < > = values in this interval not displayed.     Liver Function Tests: Recent Labs  Lab 10/19/21 1836 10/21/21 0258 10/21/21 0259 10/22/21 0307 10/23/21 0409  AST 127*  --  199* 152* 92*  ALT 65*  --  108* 121* 103*  ALKPHOS 80  --  61 57 55  BILITOT 4.3*  --  2.1* 1.6* 1.7*  PROT 7.8  --  6.4* 6.7 6.6  ALBUMIN 4.0 3.2* 3.1* 3.3* 3.2*    No results for input(s): "LIPASE", "AMYLASE" in the last 168 hours. Recent Labs  Lab 10/19/21 1838 10/23/21 0241  AMMONIA 23 31     ABG    Component Value Date/Time   PHART 7.45 10/22/2021 0340   PCO2ART 56 (H) 10/22/2021 0340   PO2ART 73 (L) 10/22/2021 0340   HCO3 38.2 (H) 10/22/2021 0340   TCO2 35 (H) 06/25/2020 2043   O2SAT 96.1 10/22/2021 0340     Coagulation Profile: Recent Labs  Lab 10/23/21 0241  INR 1.3*    Cardiac Enzymes: No results for input(s): "CKTOTAL", "CKMB", "CKMBINDEX", "TROPONINI" in the last 168 hours.  HbA1C: Hgb A1c MFr Bld  Date/Time Value Ref Range Status  06/18/2021 04:29 AM 6.2 (H) 4.8 - 5.6 % Final    Comment:    (NOTE) Pre diabetes:          5.7%-6.4%  Diabetes:              >6.4%  Glycemic control for   <7.0% adults with diabetes   06/26/2020 03:20 AM 6.3 (H) 4.8 - 5.6 % Final    Comment:    (NOTE) Pre diabetes:          5.7%-6.4%  Diabetes:              >6.4%  Glycemic control for   <7.0% adults with diabetes     CBG: Recent Labs  Lab 10/22/21 1537 10/22/21 2003 10/22/21 2323 10/23/21 0337 10/23/21 0734  GLUCAP 132* 118* 105* 107* 94     Critical care time:  NA     Joneen Roach, AGACNP-BC [facility]  See Amion for personal pager PCCM on call pager [phone] until 7pm. Please call Elink 7p-7a.  [phone]  10/23/2021 10:11 AM   I personally saw the patient and performed a substantive portion of this encounter, including a complete performance of at least one of the key components (MDM, Hx and/or Exam), in conjunction with the Advanced Practice Provider.    Extubated yesterday and tolerating well Used BiPAP overnight.  On exam morbidly obese woman, able to roll by herself in bed, no distress, decreased breath sounds bilateral, S1-S2 distant , 1+ edema.  Labs show stable hypernatremia , improving renal function, bicarbonate 34, no leukocytosis, stable anemia.  Impression/plan Acute on chronic hypercarbic respiratory failure Decompensated OSA/OHS -noncompliant with home NIV  Unclear if she has CPAP at home or NIV -At minimum, she needs BiPAP on discharge which she would qualify with a diagnosis of obesity hypoventilation.  If unable to obtain BiPAP then NIV can be prescribed  HFpEF -diuretics on hold, resume home torsemide tomorrow now that creatinine improved  History of left lower lobe  PE 08/2020 -on provoked, not on anticoagulation but likely Should be on low-dose Xarelto for prophylaxis given her poor mobility  Other social issues including homelessness noted, social work aware and assisting.  Can transfer to stepdown unit and to [facility] 9/2  Hamlet Lasecki V. Vassie Loll MD

## 2021-10-23 NOTE — TOC Progression Note (Signed)
Transition of Care [facility]) - Progression Note    Patient Details  Name: Felicia Warren MRN: [id number] Date of Birth: 1970/11/04  [facility] CM/SW Contact  Felicia Warren, [place] Phone Number: 10/23/2021, 9:05 AM  Clinical Narrative:  TOC notified by floor RN that family contacted [facility] and are pursuing guardianship. RN also stated [employer]HS social worker would like a call back as noted in patient chart.  [facility] called DHHS social worker, Felicia Warren [phone]. Felicia Warren informed TOC that he was on call last night and is no longer covering patient. He suggest to call the main number to [facility] [phone]. TOC called APS mainline [phone]. Provider asked who made the report and TOC stated family. TOC was told that no information could be disclosed to them.   TOC also called sister, Felicia Warren and left a voicemail with call back number. According to notes sister would like SNF placement. Currently patient does not have PT/OT eval notes.   TOC will continue to follow for dc needs.     Expected Discharge Plan: Homeless Shelter Barriers to Discharge: No Barriers Identified  Expected Discharge Plan and Services Expected Discharge Plan: Homeless Shelter       Living arrangements for the past 2 months: Homeless                                       Social Determinants of Health (SDOH) Interventions    Readmission Risk Interventions     No data to display

## 2021-10-23 NOTE — TOC Progression Note (Signed)
Transition of Care [facility]) - Progression Note    Patient Details  Name: Felicia Warren MRN: [id number] Date of Birth: 08/07/1970  Transition of Care [facility]) CM/SW Contact  Coralyn Helling, [place] Phone Number: 10/23/2021, 2:34 PM  Clinical Narrative:   Spoke with patients sisters Arline Asp and [name]. Patients niece, [name] is on the call too. Arline Asp would like for patient to be evaluated by psych while in the hospital. [facility] notified attending via secure chat. She reports that patient is not able to care for herself. Sister reports that patient was previously discharged to SNF and signed herself out AMA. Family would like SNF placement for patient at discharge. Arline Asp is also pursuing guardianship reports it will take 10-16 days to process. Arline Asp had questions regarding disability benefits for patient. TOC directed [name] to [facility] office and the SSI website to complete application.   Patient has been homeless since in [place]. She has been living in hotels and kicked out of multiple hotels due to living conditions. Arline Asp reports sister has had multiple admissions following confusion, falls and other medical issues. She reports patient came to [place] from [place] 2 years ago for a graduation and she was confused and came on the wrong date. She reports that patient has declined since then. She reports patient does not use CPAP or take medication. Family concerned about patient checking out AMA. They are concerned that they will not be able to find her if she leaves AMA.   PLAN: SNF vs Shelter. TOC will follow for dc needs.    Expected Discharge Plan: Homeless Shelter Barriers to Discharge: No Barriers Identified  Expected Discharge Plan and Services Expected Discharge Plan: Homeless Shelter       Living arrangements for the past 2 months: Homeless                                       Social Determinants of Health (SDOH) Interventions    Readmission Risk Interventions     No data to display

## 2021-10-24 ENCOUNTER — Other Ambulatory Visit: Payer: Self-pay

## 2021-10-24 DIAGNOSIS — I2699 Other pulmonary embolism without acute cor pulmonale: Secondary | ICD-10-CM

## 2021-10-24 DIAGNOSIS — R4689 Other symptoms and signs involving appearance and behavior: Secondary | ICD-10-CM | POA: Diagnosis not present

## 2021-10-24 DIAGNOSIS — J9602 Acute respiratory failure with hypercapnia: Secondary | ICD-10-CM | POA: Diagnosis not present

## 2021-10-24 DIAGNOSIS — E119 Type 2 diabetes mellitus without complications: Secondary | ICD-10-CM | POA: Diagnosis not present

## 2021-10-24 DIAGNOSIS — I5033 Acute on chronic diastolic (congestive) heart failure: Secondary | ICD-10-CM | POA: Diagnosis not present

## 2021-10-24 DIAGNOSIS — N390 Urinary tract infection, site not specified: Secondary | ICD-10-CM | POA: Diagnosis not present

## 2021-10-24 LAB — BASIC METABOLIC PANEL
Anion gap: 7 (ref 5–15)
BUN: 29 mg/dL — ABNORMAL HIGH (ref 6–20)
CO2: 35 mmol/L — ABNORMAL HIGH (ref 22–32)
Calcium: 8.1 mg/dL — ABNORMAL LOW (ref 8.9–10.3)
Chloride: 103 mmol/L (ref 98–111)
Creatinine, Ser: 0.89 mg/dL (ref 0.44–1.00)
GFR, Estimated: >60 mL/min (ref >60)
Glucose, Bld: 91 mg/dL (ref 70–99)
Potassium: 4.1 mmol/L (ref 3.5–5.1)
Sodium: 145 mmol/L (ref 135–145)

## 2021-10-24 LAB — TSH: TSH: 3.98 u[IU]/mL (ref 0.350–4.500)

## 2021-10-24 LAB — GLUCOSE, CAPILLARY
Glucose-Capillary: 100 mg/dL — ABNORMAL HIGH (ref 70–99)
Glucose-Capillary: 108 mg/dL — ABNORMAL HIGH (ref 70–99)
Glucose-Capillary: 120 mg/dL — ABNORMAL HIGH (ref 70–99)
Glucose-Capillary: 122 mg/dL — ABNORMAL HIGH (ref 70–99)
Glucose-Capillary: 157 mg/dL — ABNORMAL HIGH (ref 70–99)
Glucose-Capillary: 164 mg/dL — ABNORMAL HIGH (ref 70–99)

## 2021-10-24 LAB — VITAMIN B12: Vitamin B-12: 615 pg/mL (ref 180–914)

## 2021-10-24 MED ORDER — FUROSEMIDE 10 MG/ML IJ SOLN
40.0000 mg | Freq: Every day | INTRAMUSCULAR | Status: DC
  Administered 2021-10-24 – 2021-10-29 (×6): 40 mg via INTRAVENOUS
  Filled 2021-10-24 (×6): qty 4

## 2021-10-24 NOTE — Progress Notes (Signed)
Pt was able to move to the edge of bed with a 2 person assist. Pt dangled on edge of bed for approximately 10 minutes. She did not feel ready to stand and pivot to [facility] so she was helped back into bed to use bedpan.

## 2021-10-24 NOTE — Assessment & Plan Note (Addendum)
BMI 81.2

## 2021-10-24 NOTE — Hospital Course (Addendum)
Mrs. Felicia Warren is a 51 y.o. F with MO/OHS/OSA, COPD, dCHF, and DM who presented because EMS were called by the motel where she was living because she could not exit the room so they could clean it and she had soiled herself for several days.     In the ER she was disoriented, acidotic, pCO2 80, Cr >2, BNP elevated, CXR without infiltrates.        8/28: Admitted for COPD 8/29: Failed BiPAP and intubated 8/31: Extubated 9/2: Transferred [facility], Psychiatry consulted, felt patient does not have capacity  9/7: Transitioned to oral diuretics

## 2021-10-24 NOTE — Consult Note (Signed)
[facility] New Face-to-Face Psychiatric Evaluation   Service Date: October 24, 2021 LOS:  LOS: 5 days    Assessment  Ettie Nogueira is a 51 y.o. female admitted medically for 10/19/2021  4:29 PM for hypoxic resp failure and failure to thrive. She was found covered in urine and feces when [employer] attempted to evict her. She carries no known psychiatric diagnoses and has a past medical history of  obesity hypoventilation syndrome, morbid obesity, OSA, prior PE. Psychiatry was consulted for capacity to leave AMA by Dr. Elsworth Soho.    In short, patient does not have capacity to leave AMA.  In an evaluation of capacity, each of the following criteria must be met based on medical necessity in order for a patient to have capacity to make the decision in question. Of note, the capacity evaluation assesses only for the specified decision documented above and is not a determination of the patient's overall competency, which can only be adjudicated. I understand her sister is going through the guardianship process; feel this is likely for the best.   Criterion 1: The patient demonstrates a clear and consistent voluntary choice with regard to treatment options. Yes (generally wants to leave the hospital to stay alone)  Criterion 2: The patient adequately understands the disease they have, the treatment proposed, the risks of treatment, and the risks of other treatment (including no treatment). No  Criterion 3: The patient acknowledges that the details of Criterion 2 apply to them specifically and the likely consequences of treatment options proposed. No  Criterion 4: The patient demonstrates adequate reasoning/rationality within the context of their decision and can provide justification for their choice. No  In this case, the patient lacks capacity to decide to leave AMA. See patient interview below for details.   Her current presentation of being found covered in urine and feces is most  consistent with extreme self-neglect. I am familiar with her from a prior eval in 05/2021 - this presentation is very similar. At that time she was found to not have capacity to leave AMA and ultimately dc to SNF. She does not appear to have followed up with any outpt medical care since this time. Per sister she eventually signed herself out of there - exact timeline unclear but seems to be about a month ago. Her condition seems to be getting worse as she lasted much less time taking care of herself this time than last time - prior to 05/2021 had been out of the hospital almost a year and was engaging with outpt care through 2022. As above, she displays almost no insight into her medical condition and remains focused on becoming employed. She denies all sx of depression (as she did in 05/2021) - suspect there is at least some mood component but as pt currently her own decision maker and this is not a life-or-death decision will defer psychotropics at this time. There is no indication for forced (psychotropic) meds over objection. We are going to sign off - please re-consult if anything changes.    Diagnoses:  Active Hospital problems: Principal Problem:   Acute hypercapnic respiratory failure (HCC) Active Problems:   Acute on chronic heart failure with preserved ejection fraction (HCC)   Type 2 diabetes mellitus without complication (HCC)   Urinary tract infection without hematuria   Class 3 obesity ([place])   Pulmonary embolism ([place])     Plan  ## Safety and Observation Level:  - Based on my clinical evaluation, I estimate  the patient to be at low risk of self harm in the current setting - At this time, we recommend a routine level of observation. This decision is based on my review of the chart including patient's history and current presentation, interview of the patient, mental status examination, and consideration of suicide risk including evaluating suicidal ideation, plan, intent, suicidal or  self-harm behaviors, risk factors, and protective factors. This judgment is based on our ability to directly address suicide risk, implement suicide prevention strategies and develop a safety plan while the patient is in the clinical setting. Please contact our team if there is a concern that risk level has changed.   ## Medications:  -- none recommended  -- would consider wellbutrin (also suppresses appetite/causes wt loss) if pt consents    ## Medical Decision Making Capacity:  Lacks capacity to leave AMA at this time.   ## Further Work-up:  -- TSH, B12, vit D  (ordered)   -- PT, OT, speech ([facility]?)  -- most recent EKG on 8/28 had QtC of 420 -- Pertinent labwork reviewed earlier this admission includes: mild anemia, ammonia wnl  ## Disposition:  -- per primary  ##Legal Status -- vol, would IVC if tries to leave at this point in time  Thank you for this consult request. Recommendations have been communicated to the primary team.  We will sign off at this time.   [place] A Zhyon Antenucci   New history   Relevant Aspects of Hospital Course:  Admitted on 10/19/2021 for acute respiratory failure after being found on the floor of the [employer], unable to get up, covered in feces and urine.  Patient Report:  Pt seen lying on her side in bed (coincidentally walked past her room an hour or two later and was in exact same position). She is malodorous (smelling of urine) and has visible caries on most to all of her teeth. Her mentation is a little better than initial consult note during prior presentation on 06/16/21 - oriented to year, month, date, and is able to do the day of the weeks backwards.   When asked why she is in the hospital, she begins discussing the job that she lost in 2022 and her difficulties paying rent. She does not remember much of last week but flatly denies being found on the floor covered in excrement. She is concerned about whether her sister and DSS are doing the right  thing. She is focused on getting her clothes back before she leaves the hospital although is able to acknowledge she is not ready to go. She denies signing out of the SNF facility and states that she left because she "finished their program". She thinks she got out about a month ago, but it is unclear if this timeline is accurate. She claims no memory of the hospitalization in April.    She again denies any psychiatric history, She denies any current or historic SI, HI, or AH/VH. She describes her energy as "good" (which is likely false) and denies any issues with concentration or appetite. She denies any feelings of guilt or worthlessness. Overall she has mild psychomotor slowing. She enjoys watching movies for fun, but hasn't done it recently because it's not a "necessity". States her sleep "could be better" but it depends on the day; unclear if she is sleeping too much or too little. She is made aware that the TV in the hospital has movies on demand and declines assistance. She declines any and all psychotropic medications,  stating she prefers to take as few pills as possible and that she "used to take a vitamin, but doesn't anymore". She does say she would take pills "if I saw a need".   She describes her goal as "saying healthy"   ROS:  Psych specific ROS incorporated into HPI.   Collateral information:  See note by Sheran Fava NP on 10/23/21  Psychiatric History:  Information collected from prior note. Generally no personal or family psych hx, pt with no hx of substance use  Social History:  Living in [address]  From previous: Tobacco use: no Alcohol use: no Drug use: no  Family History:  The patient's family history is not on file.  Medical History: Past Medical History:  Diagnosis Date   Acute respiratory failure with hypoxia and hypercarbia (HCC)    AKI (acute kidney injury) ([place])    Community acquired pneumonia    Demand ischemia ([place])    Diarrhea    Hypercapnic  respiratory failure ([place]) 06/25/2020   Hypoglycemia    Hypokalemia    Morbid obesity (HCC)    Normocytic anemia    OSA treated with BiPAP    Pulmonary embolism ([place])    Transaminitis     Surgical History: History reviewed. No pertinent surgical history.  Medications:   Current Facility-Administered Medications:    (feeding supplement) PROSource Plus liquid 30 mL, 30 mL, Oral, BID BM, Ruthann Cancer, Jessica, DO, 30 mL at 10/24/21 0953   albuterol (PROVENTIL) (2.5 MG/3ML) 0.083% nebulizer solution 2.5 mg, 2.5 mg, Nebulization, Q4H PRN, Audria Nine, DO, 2.5 mg at 10/20/21 0247   Chlorhexidine Gluconate Cloth 2 % PADS 6 each, 6 each, Topical, Q0600, Audria Nine, DO, 6 each at 10/24/21 0517   diphenhydrAMINE-zinc acetate (BENADRYL) 2-0.1 % cream, , Topical, BID PRN, Corey Harold, NP, Given at 10/22/21 1457   docusate sodium (COLACE) capsule 100 mg, 100 mg, Oral, BID PRN, Audria Nine, DO   furosemide (LASIX) injection 40 mg, 40 mg, Intravenous, Daily, Arrien, Jimmy Picket, MD, 40 mg at 10/24/21 1634   insulin aspart (novoLOG) injection 0-15 Units, 0-15 Units, Subcutaneous, Q4H, Corey Harold, NP, 3 Units at 10/24/21 1157   multivitamin with minerals tablet 1 tablet, 1 tablet, Oral, Daily, Audria Nine, DO, 1 tablet at 10/24/21 2297   Oral care mouth rinse, 15 mL, Mouth Rinse, 4 times per day, Olalere, Adewale A, MD, 15 mL at 10/24/21 1159   polyethylene glycol (MIRALAX / GLYCOLAX) packet 17 g, 17 g, Oral, Daily PRN, Audria Nine, DO   protein supplement (ENSURE MAX) liquid, 11 oz, Oral, BID, Audria Nine, DO, 11 oz at 10/24/21 9892   rivaroxaban (XARELTO) tablet 10 mg, 10 mg, Oral, Daily, Kara Mead V, MD, 10 mg at 10/24/21 1194  Allergies: No Known Allergies     Objective  Vital signs:  Temp:  [97.9 F (36.6 C)-99.2 F (37.3 C)] 98.6 F (37 C) (09/02 1222) Pulse Rate:  [72-85] 74 (09/02 1546) Resp:  [18-23] 21 (09/02 1546) BP: (109-143)/(63-70)  143/70 (09/02 1222) SpO2:  [92 %-98 %] 95 % (09/02 1546)  Psychiatric Specialty Exam:  Presentation  General Appearance: -- (obese, disheveled, malodorous, visible caries on most to all teeth)  Eye Contact:Fair  Speech:Garbled (difficult to understand)  Speech Volume:Decreased  Handedness:No data recorded  Mood and Affect  Mood:-- (pt stated good)  Affect:-- (detached)   Thought Process  Thought Processes:Irrevelant (frequently discusses job)  Descriptions of Associations:Loose  Orientation:Partial  Thought Content:-- (less paranoid than prior evals,  somewhat suspiciosu of hospital/APS/sister)  History of Schizophrenia/Schizoaffective disorder:No  Duration of Psychotic Symptoms:N/A  Hallucinations:Hallucinations: None  Ideas of Reference:None  Suicidal Thoughts:Suicidal Thoughts: No  Homicidal Thoughts:Homicidal Thoughts: No   Sensorium  Memory:Immediate Fair; Recent Fair; Remote [place]   Executive Functions  Concentration:Poor  Attention Span:Good  Recall:Poor  Fund of Knowledge:Fair  Language:Fair   Psychomotor Activity  Psychomotor Activity:Psychomotor Activity: Decreased (when walked by an hour or two later, lying in exactly the same position)  Assets  Assets:Social Support   Sleep  Sleep:Sleep: -- ("could be better" (did not offer details))   Physical Exam: Physical Exam Constitutional:      Appearance: She is obese.     Comments: malodorous  HENT:     Head: Normocephalic.  Pulmonary:     Comments: Sometimes appears to be struggling to breathe Neurological:     Mental Status: She is alert and oriented to person, place, and time.  Psychiatric:     Comments: Poor judgment    Blood pressure (!) 143/70, pulse 74, temperature 98.6 F (37 C), temperature source Oral, resp. rate (!) 21, height 5' 1" (1.549 m), weight (!) 203.2 kg, SpO2 95 %. Body mass index is 84.65 kg/m.

## 2021-10-24 NOTE — Assessment & Plan Note (Addendum)
Has history of prior VTE - Continue rivaroxaban

## 2021-10-24 NOTE — Assessment & Plan Note (Addendum)
Glucose well controlled. Last hemoglobin A1c 6.2%   - Continue sliding scale corrections - Stop Jardiance

## 2021-10-24 NOTE — Progress Notes (Addendum)
Progress Note   [name] MEQ:[id number] DOB: 1970/03/24 DOA: 10/19/2021     5 DOS: the patient was seen and examined on 10/24/2021   Brief hospital course: Felicia Warren was admitted to the hospital with the working diagnosis of acute hypercapnic and hypoxemic respiratory failure.  51 yo female with the past medical history of obesity hypoventilation syndrome, T2DM and heart failure who presented with respiratory failure. Patient was noted to be confused 48 hrs before her admission. Apparently she lives in a motel for the past 3 weeks, but no able to take care of herself or pay the bills. Her sister call the police to to a welfare check, she was found confused, covered in feces and urine. She was transported to the ED. On her initial physical examination she was able to speak but noted to be somnolent. She was placed on non invasive mechanical ventilation. Her blood pressure was 148/82, RR 22, HR 83 and 02 saturation 95%, patient somnolent but arousable to painful and verbal stimuli, lungs with decreased breath sounds bilaterally, heart with S1 and S2 present and rhythmic, abdomen not distended and positive lower extremity edema.   VBG 7,23/84/53/35/73% Na 146. K 4.2 Cl 102 bicarbonate 30 glucose 100 bun 27 cr 2,38  High sensitive troponin 199 and 209 BNP 778  Lactic acid 2,2  Wbc 9,7 hgb 12.8 plt 308  Urine analysis with SG 1,014, 100 protein, 21-50 wbc  Sars covid 19 negative  Toxicology screen negative   Chest radiograph with hypoinflation, positive cardiomegaly. No infiltrates.  EKG with 109 bpm, right axis, normal intervals, sinus rhythm, no significant ST segment or T wave changes, low voltage.   Patient failed non invasive mechanical ventilation and was intubated on 08/29. 08/31 patient extubated with good toleration 09/02 transferred to [facility].    Assessment and Plan: * Acute hypercapnic respiratory failure (HCC) Obesity hypoventilation syndrome.   Patient using  intermittent Bipap with good toleration.  Plan to continue Bipap as needed during the day and continuous at night.  Check VBG in am. Continue with Pt and Ot.  Patient will need outpatient non invasive mechanical ventilation.    Acute on chronic heart failure with preserved ejection fraction (HCC) Echocardiogram with LV systolic function is preserved 60 to 65%, moderate LVH. Preserved RV systolic function. No significant valvular disease.   Her volume status has improved.  Patient was diuresed on admission.   Urine output is documented to be 1,350 ml Blood pressure 120 to 140 mmHg.   Plan to continue diuresis with IV furosemide.   Type 2 diabetes mellitus without complication (HCC) Continue glucose cover and monitoring with insulin sliding scale.  Her fasting glucose this am is 91  Urinary tract infection without hematuria Patient has completed antibiotic therapy for urinary tract infection.   Pulmonary embolism (HCC) History of pulmonary embolism, continue anticoagulation with rivaroxaban.   Class 3 obesity (HCC) Calculated BMI is 84,6         Subjective: Patient with intermittent use of Bipap, no chest pain, mild dyspnea, very weak and deconditioned   Physical Exam: Vitals:   10/24/21 0025 10/24/21 0245 10/24/21 0422 10/24/21 1222  BP: 109/67  121/64 (!) 143/70  Pulse: 82 76 72 82  Resp: 20 20 20 18   Temp: 98.5 F (36.9 C)  99.2 F (37.3 C) 98.6 F (37 C)  TempSrc: Axillary   Oral  SpO2: 96% 94% 98% 92%  Weight:      Height:  Neurology Slow to respond. Mild somnolence  ENT with no pallor Cardiovascular with S1 and S2 present with no gallops, rubs or murmurs Respiratory with no rales or wheezing Abdomen with no distention Non pitting lower extremity edema  Data Reviewed:    Family Communication: no family at the bedside   Disposition: Status is: Inpatient Remains inpatient appropriate because: heart failure and respiratory failure   Planned  Discharge Destination: Home     Author: Coralie Keens, MD 10/24/2021 3:46 PM  For on call review [email or web address].

## 2021-10-24 NOTE — Assessment & Plan Note (Addendum)
Echo shows EF 60 to 65%, LVH, no significant valvular disease.  She was diuresed 10 L and is now stable on 3 L supplemental oxygen. - Continue furosemide and potassium - Stop Jardiance

## 2021-10-24 NOTE — Assessment & Plan Note (Addendum)
Completed course of antibiotics for UTI.

## 2021-10-24 NOTE — Assessment & Plan Note (Addendum)
Obesity hypoventilation syndrome.   Patient using intermittent Bipap with good toleration.  VBG this am with pH at 7.42/ pC02 66 and bicarbonate at 42.  Plan to continue with non invasive bilevel mechanical ventilation.  Will need home ventilator at the time of her discharge.  Continue with Pt and Ot.  Patient will need placement.

## 2021-10-25 DIAGNOSIS — I5033 Acute on chronic diastolic (congestive) heart failure: Secondary | ICD-10-CM | POA: Diagnosis not present

## 2021-10-25 DIAGNOSIS — I2782 Chronic pulmonary embolism: Secondary | ICD-10-CM | POA: Diagnosis not present

## 2021-10-25 DIAGNOSIS — E119 Type 2 diabetes mellitus without complications: Secondary | ICD-10-CM | POA: Diagnosis not present

## 2021-10-25 DIAGNOSIS — J9602 Acute respiratory failure with hypercapnia: Secondary | ICD-10-CM | POA: Diagnosis not present

## 2021-10-25 LAB — BLOOD GAS, VENOUS
Acid-Base Excess: 15 mmol/L — ABNORMAL HIGH (ref 0.0–2.0)
Bicarbonate: 42.8 mmol/L — ABNORMAL HIGH (ref 20.0–28.0)
O2 Saturation: 97.1 %
Patient temperature: 37
pCO2, Ven: 66 mmHg — ABNORMAL HIGH (ref 44–60)
pH, Ven: 7.42 (ref 7.25–7.43)
pO2, Ven: 74 mmHg — ABNORMAL HIGH (ref 32–45)

## 2021-10-25 LAB — BASIC METABOLIC PANEL
Anion gap: 7 (ref 5–15)
BUN: 23 mg/dL — ABNORMAL HIGH (ref 6–20)
CO2: 35 mmol/L — ABNORMAL HIGH (ref 22–32)
Calcium: 8.3 mg/dL — ABNORMAL LOW (ref 8.9–10.3)
Chloride: 101 mmol/L (ref 98–111)
Creatinine, Ser: 0.83 mg/dL (ref 0.44–1.00)
GFR, Estimated: >60 mL/min (ref >60)
Glucose, Bld: 106 mg/dL — ABNORMAL HIGH (ref 70–99)
Potassium: 3.5 mmol/L (ref 3.5–5.1)
Sodium: 143 mmol/L (ref 135–145)

## 2021-10-25 LAB — GLUCOSE, CAPILLARY
Glucose-Capillary: 103 mg/dL — ABNORMAL HIGH (ref 70–99)
Glucose-Capillary: 104 mg/dL — ABNORMAL HIGH (ref 70–99)
Glucose-Capillary: 115 mg/dL — ABNORMAL HIGH (ref 70–99)
Glucose-Capillary: 115 mg/dL — ABNORMAL HIGH (ref 70–99)
Glucose-Capillary: 130 mg/dL — ABNORMAL HIGH (ref 70–99)
Glucose-Capillary: 96 mg/dL (ref 70–99)

## 2021-10-25 LAB — VITAMIN D 25 HYDROXY (VIT D DEFICIENCY, FRACTURES): Vit D, 25-Hydroxy: 21.41 ng/mL — ABNORMAL LOW (ref 30–100)

## 2021-10-25 MED ORDER — POTASSIUM CHLORIDE CRYS ER 20 MEQ PO TBCR
40.0000 meq | EXTENDED_RELEASE_TABLET | Freq: Once | ORAL | Status: AC
Start: 2021-10-25 — End: 2021-10-25
  Administered 2021-10-25: 40 meq via ORAL
  Filled 2021-10-25: qty 2

## 2021-10-25 MED ORDER — CARVEDILOL 3.125 MG PO TABS
3.1250 mg | ORAL_TABLET | Freq: Two times a day (BID) | ORAL | Status: DC
  Administered 2021-10-25 – 2021-11-19 (×29): 3.125 mg via ORAL
  Filled 2021-10-25 (×37): qty 1

## 2021-10-25 MED ORDER — EMPAGLIFLOZIN 10 MG PO TABS
10.0000 mg | ORAL_TABLET | Freq: Every day | ORAL | Status: DC
  Administered 2021-10-26 – 2021-10-31 (×4): 10 mg via ORAL
  Filled 2021-10-25 (×9): qty 1

## 2021-10-25 NOTE — Progress Notes (Signed)
Progress Note   PatientSevin Farone Warren QQV:[id number] DOB: 1970-06-22 DOA: 10/19/2021     6 DOS: the patient was seen and examined on 10/25/2021   Brief hospital course: Felicia Warren was admitted to the hospital with the working diagnosis of acute hypercapnic and hypoxemic respiratory failure.  51 yo female with the past medical history of obesity hypoventilation syndrome, T2DM and heart failure who presented with respiratory failure. Patient was noted to be confused 48 hrs before her admission. Apparently she lives in a motel for the past 3 weeks, but no able to take care of herself or pay the bills. Her sister call the police to to a welfare check, she was found confused, covered in feces and urine. She was transported to the ED. On her initial physical examination she was able to speak but noted to be somnolent. She was placed on non invasive mechanical ventilation. Her blood pressure was 148/82, RR 22, HR 83 and 02 saturation 95%, patient somnolent but arousable to painful and verbal stimuli, lungs with decreased breath sounds bilaterally, heart with S1 and S2 present and rhythmic, abdomen not distended and positive lower extremity edema.   VBG 7,23/84/53/35/73% Na 146. K 4.2 Cl 102 bicarbonate 30 glucose 100 bun 27 cr 2,38  High sensitive troponin 199 and 209 BNP 778  Lactic acid 2,2  Wbc 9,7 hgb 12.8 plt 308  Urine analysis with SG 1,014, 100 protein, 21-50 wbc  Sars covid 19 negative  Toxicology screen negative   Chest radiograph with hypoinflation, positive cardiomegaly. No infiltrates.  EKG with 109 bpm, right axis, normal intervals, sinus rhythm, no significant ST segment or T wave changes, low voltage.   Patient failed non invasive mechanical ventilation and was intubated on 08/29. 08/31 patient extubated with good toleration 09/02 transferred to [facility].  09/03 patient tolerating well Bipap, at night and as needed during the day. She needs placement.  Per psychiatry patient does not  have capacity to leave the hospital ama.   Assessment and Plan: * Acute hypercapnic respiratory failure (HCC) Obesity hypoventilation syndrome.   Patient using intermittent Bipap with good toleration.  VBG this am with pH at 7.42/ pC02 66 and bicarbonate at 42.  Plan to continue with non invasive bilevel mechanical ventilation.  Will need home ventilator at the time of her discharge.  Continue with Pt and Ot.  Patient will need placement.   Acute on chronic heart failure with preserved ejection fraction (HCC) Echocardiogram with LV systolic function is preserved 60 to 65%, moderate LVH. Preserved RV systolic function. No significant valvular disease.    Urine output is documented to be 2,400 ml Blood pressure 111 to 119  mmHg.   Plan to continue diuresis with IV furosemide.  Add SGLt 2 inh and B blockade.  If blood pressure tolerated will add ARB.  Add 40 meq Kcl to prevent hypokalemia.   Type 2 diabetes mellitus without complication (HCC) Continue glucose cover and monitoring with insulin sliding scale.  Patient is tolerating po well, fasting glucose is 106 mg/dl.   Urinary tract infection without hematuria Patient has completed antibiotic therapy for urinary tract infection.   Pulmonary embolism (HCC) History of pulmonary embolism, continue anticoagulation with rivaroxaban.   Class 3 obesity (HCC) Calculated BMI is 84,6         Subjective: Patient with no chest pain, dyspnea has improved, she is feeling better than yesterday, her mobility continue to be very limited.   Physical Exam: Vitals:   10/24/21 2300 10/25/21 3329  10/25/21 1054 10/25/21 1359  BP:  130/71 (!) 119/58 (!) 111/52  Pulse:  82 89 90  Resp: 20 19 20 20   Temp:  98.1 F (36.7 C) 98.5 F (36.9 C) 98.7 F (37.1 C)  TempSrc:  Oral Oral Oral  SpO2:  93% 96% 95%  Weight:      Height:       Neurology awake and alert ENT with no pallor Cardiovascular with S1 and S2 present and rhythmic with no  gallops, rubs or murmurs Respiratory with no wheezing or rhonchi on anterior auscultation  Abdomen protuberant but not tender Positive lower extremity edema non pitting  Data Reviewed:    Family Communication: no family at the bedside   Disposition: Status is: Inpatient Remains inpatient appropriate because: pending placement, and continue diuresis.   Planned Discharge Destination: SNF       Author: , MD 10/25/2021 2:47 PM  For on call review [email or web address].

## 2021-10-26 DIAGNOSIS — J9602 Acute respiratory failure with hypercapnia: Secondary | ICD-10-CM | POA: Diagnosis not present

## 2021-10-26 LAB — GLUCOSE, CAPILLARY
Glucose-Capillary: 103 mg/dL — ABNORMAL HIGH (ref 70–99)
Glucose-Capillary: 111 mg/dL — ABNORMAL HIGH (ref 70–99)
Glucose-Capillary: 122 mg/dL — ABNORMAL HIGH (ref 70–99)
Glucose-Capillary: 85 mg/dL (ref 70–99)
Glucose-Capillary: 91 mg/dL (ref 70–99)
Glucose-Capillary: 91 mg/dL (ref 70–99)
Glucose-Capillary: 99 mg/dL (ref 70–99)

## 2021-10-26 LAB — BASIC METABOLIC PANEL
Anion gap: 5 (ref 5–15)
BUN: 16 mg/dL (ref 6–20)
CO2: 37 mmol/L — ABNORMAL HIGH (ref 22–32)
Calcium: 8.4 mg/dL — ABNORMAL LOW (ref 8.9–10.3)
Chloride: 101 mmol/L (ref 98–111)
Creatinine, Ser: 0.79 mg/dL (ref 0.44–1.00)
GFR, Estimated: >60 mL/min (ref >60)
Glucose, Bld: 93 mg/dL (ref 70–99)
Potassium: 3.3 mmol/L — ABNORMAL LOW (ref 3.5–5.1)
Sodium: 143 mmol/L (ref 135–145)

## 2021-10-26 LAB — MAGNESIUM: Magnesium: 1.9 mg/dL (ref 1.7–2.4)

## 2021-10-26 MED ORDER — POTASSIUM CHLORIDE CRYS ER 20 MEQ PO TBCR
40.0000 meq | EXTENDED_RELEASE_TABLET | Freq: Once | ORAL | Status: AC
Start: 2021-10-26 — End: 2021-10-26
  Administered 2021-10-26: 40 meq via ORAL
  Filled 2021-10-26: qty 2

## 2021-10-26 NOTE — Progress Notes (Signed)
Physical Therapy Treatment Patient Details Name: Felicia Warren MRN: [id number] DOB: [birth year] Today's Date: 10/26/2021   History of Present Illness HPI: 51 yo female with pmh dm2, hfpEF, chronic osa non adherent with home niv. [employer], FTT - drowsy found to be hypercarbic with AKI, acute liver injury, and elevated bnp/trop with known HFpef, morbid obesity.   Intubated 8/28, self extubated 8/31. Pt's CXR negative for pna    PT Comments    Pt with marked improvement in activity tolerance and ability to participate.  Pt requiring min assist of 2 for safety to exit bed and ambulate limited distance in hall with RW.  Pt pleased with progress and with being able to sit up in chair for first time.   Recommendations for follow up therapy are one component of a multi-disciplinary discharge planning process, led by the attending physician.  Recommendations may be updated based on patient status, additional functional criteria and insurance authorization.  Follow Up Recommendations  Skilled nursing-short term rehab (<3 hours/day) Can patient physically be transported by private vehicle: No   Assistance Recommended at Discharge Frequent or constant Supervision/Assistance  Patient can return home with the following Two people to help with walking and/or transfers;Two people to help with bathing/dressing/bathroom;Assistance with cooking/housework;Assist for transportation;Help with stairs or ramp for entrance   Equipment Recommendations  None recommended by PT    Recommendations for Other Services       Precautions / Restrictions Precautions Precautions: Fall Restrictions Weight Bearing Restrictions: No     Mobility  Bed Mobility Overal bed mobility: Needs Assistance Bed Mobility: Rolling, Supine to Sit Rolling: Independent   Supine to sit: Min guard, +2 for safety/equipment     General bed mobility comments: cues for safety 2* precarious nature of bari bed    Transfers Overall  transfer level: Needs assistance Equipment used: Rolling walker (2 wheels) Transfers: Sit to/from Stand Sit to Stand: Min guard, +2 safety/equipment, From elevated surface           General transfer comment: Steady assist with safety cues    Ambulation/Gait Ambulation/Gait assistance: Min assist, Min guard Gait Distance (Feet): 58 Feet Assistive device: Rolling walker (2 wheels) Gait Pattern/deviations: Step-to pattern, Step-through pattern, Decreased step length - right, Decreased step length - left, Shuffle, Trunk flexed, Wide base of support Gait velocity: decr     General Gait Details: Steady assist with RW; cues for posture, safety  and position from RW; multiple standing rest stops to complete task; pt on 3L O2   Stairs             Wheelchair Mobility    Modified Rankin (Stroke Patients Only)       Balance Overall balance assessment: Needs assistance Sitting-balance support: No upper extremity supported, Feet supported Sitting balance-Leahy Scale: Good     Standing balance support: Bilateral upper extremity supported Standing balance-Leahy Scale: Poor                              Cognition Arousal/Alertness: Awake/alert Behavior During Therapy: WFL for tasks assessed/performed                                   General Comments: Pt is oriented x 3        Exercises      General Comments        Pertinent Vitals/Pain Pain Assessment  Pain Assessment: No/denies pain    Home Living                          Prior Function            PT Goals (current goals can now be found in the care plan section) Acute Rehab PT Goals Patient Stated Goal: Walk PT Goal Formulation: With patient Time For Goal Achievement: 11/06/21 Potential to Achieve Goals: Fair Progress towards PT goals: Progressing toward goals    Frequency    Min 3X/week      PT Plan Current plan remains appropriate    Co-evaluation               AM-PAC PT "6 Clicks" Mobility   Outcome Measure  Help needed turning from your back to your side while in a flat bed without using bedrails?: None Help needed moving from lying on your back to sitting on the side of a flat bed without using bedrails?: A Little Help needed moving to and from a bed to a chair (including a wheelchair)?: A Little Help needed standing up from a chair using your arms (e.g., wheelchair or bedside chair)?: A Little Help needed to walk in hospital room?: A Lot Help needed climbing 3-5 steps with a railing? : Total 6 Click Score: 16    End of Session   Activity Tolerance: Patient tolerated treatment well Patient left: in chair;with call bell/phone within reach;with chair alarm set Nurse Communication: Mobility status PT Visit Diagnosis: Difficulty in walking, not elsewhere classified (R26.2)     Time: 4128-7867 PT Time Calculation (min) (ACUTE ONLY): 30 min  Charges:  $Gait Training: 23-37 mins                     Mauro Kaufmann PT Acute Rehabilitation Services Pager [phone] Office [phone]    Sussie Minor 10/26/2021, 12:39 PM

## 2021-10-26 NOTE — Progress Notes (Signed)
PROGRESS NOTE    Felicia Warren  NIO:[id number] DOB: 1970/02/28 DOA: 10/19/2021 PCP: Storm Frisk, MD   Brief Narrative:  Mrs. Barlowe was admitted to the hospital with the working diagnosis of acute hypercapnic and hypoxemic respiratory failure.   51 yo female with the past medical history of obesity hypoventilation syndrome, T2DM and heart failure who presented with respiratory failure. Patient was noted to be confused 48 hrs before her admission. Apparently she lives in a motel for the past 3 weeks, but no able to take care of herself or pay the bills. Her sister call the police to to a welfare check, she was found confused, covered in feces and urine. She was transported to the ED. On her initial physical examination she was able to speak but noted to be somnolent. She was placed on non invasive mechanical ventilation. Her blood pressure was 148/82, RR 22, HR 83 and 02 saturation 95%, patient somnolent but arousable to painful and verbal stimuli, lungs with decreased breath sounds bilaterally, heart with S1 and S2 present and rhythmic, abdomen not distended and positive lower extremity edema.    VBG 7,23/84/53/35/73% Na 146. K 4.2 Cl 102 bicarbonate 30 glucose 100 bun 27 cr 2,38  High sensitive troponin 199 and 209 BNP 778  Lactic acid 2,2  Wbc 9,7 hgb 12.8 plt 308  Urine analysis with SG 1,014, 100 protein, 21-50 wbc  Sars covid 19 negative  Toxicology screen negative    Chest radiograph with hypoinflation, positive cardiomegaly. No infiltrates.   EKG with 109 bpm, right axis, normal intervals, sinus rhythm, no significant ST segment or T wave changes, low voltage.    Patient failed non invasive mechanical ventilation and was intubated on 08/29. 08/31 patient extubated with good toleration 09/02 transferred to [facility].  09/03 patient tolerating well Bipap, at night and as needed during the day. She needs placement.  Per psychiatry patient does not have capacity to leave the hospital  ama.   Assessment & Plan:   Principal Problem:   Acute hypercapnic respiratory failure (HCC) Active Problems:   Acute on chronic heart failure with preserved ejection fraction (HCC)   Type 2 diabetes mellitus without complication (HCC)   Urinary tract infection without hematuria   Pulmonary embolism (HCC)   Class 3 obesity (HCC)  Acute hypoxic and hypercapnic respiratory failure (HCC) Obesity hypoventilation syndrome.  Continue BiPAP at night. Plan to continue with non invasive bilevel mechanical ventilation.  Will need home ventilator at the time of her discharge.  Continue with PT/OT.  Continue 3 L oxygen via nasal.  During the daytime Patient will need placement.    Acute on chronic heart failure with preserved ejection fraction (HCC) Echocardiogram with LV systolic function is preserved 60 to 65%, moderate LVH. Preserved RV systolic function. No significant valvular disease.  40 mg IV Lasix, coreg, jardiance.   Type 2 diabetes mellitus without complication (HCC): Controlled.  Continue SSI.   Urinary tract infection without hematuria Patient has completed antibiotic therapy for urinary tract infection.    Pulmonary embolism (HCC) History of pulmonary embolism, continue anticoagulation with rivaroxaban.    Class 3 obesity (HCC) Calculated BMI is 84,6 weight loss counseling provided.  Capacity: She was assessed by psychiatry and per them, she does not have the capacity to leave AGAINST MEDICAL ADVICE or make any medical decisions for self.  Family was concerned that she might try to leave AGAINST MEDICAL ADVICE again as she did from skilled nursing facility recently.  She in fact  tried to do so as well.  Patient is however not under IVC.  However, if she were to try to be aggressive and tried to leave, IVC should be activated immediately.   DVT prophylaxis: rivaroxaban (XARELTO) tablet 10 mg Start: 10/23/21 1300 SCDs Start: 10/19/21 2107   Code Status: Full Code  Family  Communication:  None present at bedside.  Updated Sister Arline Asp about current status that she is medically stable and I am waiting for placement.  Status is: Inpatient Remains inpatient appropriate because: Medically stable, waiting for placement.   Estimated body mass index is 84.65 kg/m as calculated from the following:   Height as of this encounter: 5\' 1"  (1.549 m).   Weight as of this encounter: 203.2 kg.    Nutritional Assessment: Body mass index is 84.65 kg/m. Seen by dietician.  I agree with the assessment and plan as outlined below: Nutrition Status: Nutrition Problem: Inadequate oral intake Etiology: inability to eat Signs/Symptoms: NPO status Interventions: Premier Protein, MVI, Prostat  . Skin Assessment: I have examined the patient's skin and I agree with the wound assessment as performed by the wound care RN as outlined below:    Consultants:  Psychiatry  Procedures:  None  Antimicrobials:  Anti-infectives (From admission, onward)    Start     Dose/Rate Route Frequency Ordered Stop   10/22/21 2100  cefTRIAXone (ROCEPHIN) 1 g in sodium chloride 0.9 % 100 mL IVPB        1 g 200 mL/hr over 30 Minutes Intravenous Every 24 hours 10/22/21 0748 10/22/21 2203   10/20/21 2100  cefTRIAXone (ROCEPHIN) 1 g in sodium chloride 0.9 % 100 mL IVPB  Status:  Discontinued        1 g 200 mL/hr over 30 Minutes Intravenous Every 24 hours 10/19/21 2118 10/22/21 0748   10/19/21 2100  cefTRIAXone (ROCEPHIN) 2 g in sodium chloride 0.9 % 100 mL IVPB        2 g 200 mL/hr over 30 Minutes Intravenous  Once 10/19/21 2046 10/19/21 2238         Subjective:  Patient seen and examined.  She is alert and oriented.  She has no complaints.  Objective: Vitals:   10/25/21 1359 10/25/21 2028 10/26/21 0659 10/26/21 0854  BP: (!) 111/52 (!) 116/56 (!) 110/59 (!) 140/53  Pulse: 90 91 77 74  Resp: 20 20 20    Temp: 98.7 F (37.1 C) 98 F (36.7 C) 98 F (36.7 C)   TempSrc: Oral Oral  Oral   SpO2: 95% 99% 95%   Weight:      Height:        Intake/Output Summary (Last 24 hours) at 10/26/2021 1317 Last data filed at 10/26/2021 1252 Gross per 24 hour  Intake 460 ml  Output 1700 ml  Net -1240 ml   Filed Weights   10/20/21 0700 10/21/21 0339 10/22/21 0452  Weight: (!) 204.1 kg (!) 204.1 kg (!) 203.2 kg    Examination:  General exam: Appears calm and comfortable, morbidly obese Respiratory system: Clear to auscultation. Respiratory effort normal. Cardiovascular system: S1 & S2 heard, RRR. No JVD, murmurs, rubs, gallops or clicks.  1 pitting edema bilateral lower extremity. Gastrointestinal system: Abdomen is nondistended, soft and nontender. No organomegaly or masses felt. Normal bowel sounds heard. Central nervous system: Alert and oriented. No focal neurological deficits. Extremities: Symmetric 5 x 5 power. Skin: No rashes, lesions or ulcers Psychiatry: Judgement and insight appear normal. Mood & affect appropriate.    Data  Reviewed: I have personally reviewed following labs and imaging studies  CBC: Recent Labs  Lab 10/19/21 1836 10/20/21 0247 10/21/21 0259 10/22/21 0307 10/23/21 0241 10/23/21 0409  WBC 9.7 8.1 5.5 5.2 5.4 5.3  NEUTROABS 6.8  --   --   --   --   --   HGB 12.8 12.9 11.5* 11.5* 11.4* 11.5*  HCT 48.5* 50.2* 42.1 41.4 42.4 41.8  MCV 84.3 87.2 82.1 81.0 83.1 82.3  PLT 308 261 220 190 151 160   Basic Metabolic Panel: Recent Labs  Lab 10/21/21 0258 10/21/21 0259 10/21/21 1306 10/21/21 1642 10/22/21 0307 10/22/21 1659 10/23/21 0409 10/24/21 0502 10/25/21 0518 10/26/21 0416  NA 147*  --   --   --  147*  --  147* 145 143 143  K 4.1  --   --   --  3.8  --  3.6 4.1 3.5 3.3*  CL 105  --   --   --  105  --  106 103 101 101  CO2 28  --   --   --  31  --  34* 35* 35* 37*  GLUCOSE 145*  --   --   --  147*  --  106* 91 106* 93  BUN 37*  --   --   --  47*  --  38* 29* 23* 16  CREATININE 1.95*  --   --   --  1.59*  --  1.08* 0.89 0.83 0.79   CALCIUM 8.3*  --   --   --  8.3*  --  8.0* 8.1* 8.3* 8.4*  MG  --    < > 2.2 2.2 2.3 2.4  --   --   --  1.9  PHOS 3.4  --  3.1 2.9 2.4* 2.8  --   --   --   --    < > = values in this interval not displayed.   GFR: Estimated Creatinine Clearance: 141.2 mL/min (by C-G formula based on SCr of 0.79 mg/dL). Liver Function Tests: Recent Labs  Lab 10/19/21 1836 10/21/21 0258 10/21/21 0259 10/22/21 0307 10/23/21 0409  AST 127*  --  199* 152* 92*  ALT 65*  --  108* 121* 103*  ALKPHOS 80  --  61 57 55  BILITOT 4.3*  --  2.1* 1.6* 1.7*  PROT 7.8  --  6.4* 6.7 6.6  ALBUMIN 4.0 3.2* 3.1* 3.3* 3.2*   No results for input(s): "LIPASE", "AMYLASE" in the last 168 hours. Recent Labs  Lab 10/19/21 1838 10/23/21 0241  AMMONIA 23 31   Coagulation Profile: Recent Labs  Lab 10/23/21 0241  INR 1.3*   Cardiac Enzymes: No results for input(s): "CKTOTAL", "CKMB", "CKMBINDEX", "TROPONINI" in the last 168 hours. BNP (last 3 results) No results for input(s): "PROBNP" in the last 8760 hours. HbA1C: No results for input(s): "HGBA1C" in the last 72 hours. CBG: Recent Labs  Lab 10/25/21 2024 10/26/21 0019 10/26/21 0438 10/26/21 0734 10/26/21 1129  GLUCAP 115* 103* 99 85 122*   Lipid Profile: No results for input(s): "CHOL", "HDL", "LDLCALC", "TRIG", "CHOLHDL", "LDLDIRECT" in the last 72 hours. Thyroid Function Tests: Recent Labs    10/24/21 1934  TSH 3.980   Anemia Panel: Recent Labs    10/24/21 1934  VITAMINB12 615   Sepsis Labs: Recent Labs  Lab 10/19/21 1837 10/19/21 2040  LATICACIDVEN 2.2* 1.6    Recent Results (from the past 240 hour(s))  Respiratory (~20 pathogens) panel by PCR  Status: None   Collection Time: 10/19/21  9:21 PM   Specimen: Nasopharyngeal Swab; Respiratory  Result Value Ref Range Status   Adenovirus NOT DETECTED NOT DETECTED Final   Coronavirus 229E NOT DETECTED NOT DETECTED Final    Comment: (NOTE) The Coronavirus on the Respiratory Panel,  DOES NOT test for the novel  Coronavirus (2019 nCoV)    Coronavirus HKU1 NOT DETECTED NOT DETECTED Final   Coronavirus NL63 NOT DETECTED NOT DETECTED Final   Coronavirus OC43 NOT DETECTED NOT DETECTED Final   Metapneumovirus NOT DETECTED NOT DETECTED Final   Rhinovirus / Enterovirus NOT DETECTED NOT DETECTED Final   Influenza A NOT DETECTED NOT DETECTED Final   Influenza B NOT DETECTED NOT DETECTED Final   Parainfluenza Virus 1 NOT DETECTED NOT DETECTED Final   Parainfluenza Virus 2 NOT DETECTED NOT DETECTED Final   Parainfluenza Virus 3 NOT DETECTED NOT DETECTED Final   Parainfluenza Virus 4 NOT DETECTED NOT DETECTED Final   Respiratory Syncytial Virus NOT DETECTED NOT DETECTED Final   Bordetella pertussis NOT DETECTED NOT DETECTED Final   Bordetella Parapertussis NOT DETECTED NOT DETECTED Final   Chlamydophila pneumoniae NOT DETECTED NOT DETECTED Final   Mycoplasma pneumoniae NOT DETECTED NOT DETECTED Final    Comment: Performed at [facility], [address], [place], [place] [address]  Urine Culture     Status: Abnormal   Collection Time: 10/19/21  9:39 PM   Specimen: Urine, Catheterized  Result Value Ref Range Status   Specimen Description   Final    URINE, CATHETERIZED Performed at [facility], [address] [place], [place] [address]    Special Requests   Final    NONE Performed at [facility], [address] [place], [place] [address]    Culture (A)  Final    70,000 COLONIES/mL LACTOBACILLUS SPECIES Standardized susceptibility testing for this organism is not available. Performed at [facility], [address], [place], [place] [address]    Report Status 10/21/2021 FINAL  Final  SARS Coronavirus 2 by RT PCR (hospital order, performed in [facility] hospital lab) *cepheid single result test*     Status: None   Collection Time: 10/19/21 11:12 PM  Result Value Ref Range Status   SARS Coronavirus 2 by RT PCR  NEGATIVE NEGATIVE Final    Comment: (NOTE) SARS-CoV-2 target nucleic acids are NOT DETECTED.  The SARS-CoV-2 RNA is generally detectable in upper and lower respiratory specimens during the acute phase of infection. The lowest concentration of SARS-CoV-2 viral copies this assay can detect is 250 copies / mL. A negative result does not preclude SARS-CoV-2 infection and should not be used as the sole basis for treatment or other patient management decisions.  A negative result may occur with improper specimen collection / handling, submission of specimen other than nasopharyngeal swab, presence of viral mutation(s) within the areas targeted by this assay, and inadequate number of viral copies (<250 copies / mL). A negative result must be combined with clinical observations, patient history, and epidemiological information.  Fact Sheet for Patients:   RoadLapTop.co.za  Fact Sheet for Healthcare Providers: http://kim-miller.com/  This test is not yet approved or  cleared by the [place] FDA and has been authorized for detection and/or diagnosis of SARS-CoV-2 by FDA under an Emergency Use Authorization (EUA).  This EUA will remain in effect (meaning this test can be used) for the duration of the COVID-19 declaration under Section 564(b)(1) of the Act,  21 U.S.C. section 360bbb-3(b)(1), unless the authorization is terminated or revoked sooner.  Performed at [facility], [address], [place], [place] [address]   MRSA Next Gen by PCR, Nasal     Status: None   Collection Time: 10/20/21  1:33 AM   Specimen: Nasal Mucosa; Nasal Swab  Result Value Ref Range Status   MRSA by PCR Next Gen NOT DETECTED NOT DETECTED Final    Comment: (NOTE) The GeneXpert MRSA Assay (FDA approved for NASAL specimens only), is one component of a comprehensive MRSA colonization surveillance program. It is not intended to diagnose MRSA  infection nor to guide or monitor treatment for MRSA infections. Test performance is not FDA approved in patients less than 67 years old. Performed at [facility], [address], [place], [place] [address]      Radiology Studies: No results found.  Scheduled Meds:  (feeding supplement) PROSource Plus  30 mL Oral BID BM   carvedilol  3.125 mg Oral BID WC   Chlorhexidine Gluconate Cloth  6 each Topical Q0600   empagliflozin  10 mg Oral Daily   furosemide  40 mg Intravenous Daily   insulin aspart  0-15 Units Subcutaneous Q4H   multivitamin with minerals  1 tablet Oral Daily   mouth rinse  15 mL Mouth Rinse 4 times per day   Ensure Max Protein  11 oz Oral BID   rivaroxaban  10 mg Oral Daily   Continuous Infusions:   LOS: 7 days   Hughie Closs, MD [facility]  10/26/2021, 1:17 PM   *Please note that this is a verbal dictation therefore any spelling or grammatical errors are due to the "Dragon Medical One" system interpretation.  Please page via Amion and do not message via secure chat for urgent patient care matters. Secure chat can be used for non urgent patient care matters.  How to contact the [facility] Attending or Consulting provider 7A - 7P or covering provider during after hours 7P -7A, for this patient?  Check the care team in [facility] and look for a) attending/consulting TR[facility] provider listed and b) the [facility] team listed. Page or secure chat 7A-7P. Log into [email or web address] and use [place]'s universal password to access. If you do not have the password, please contact the hospital operator. Locate the [facility] provider you are looking for under [facility] and page to a number that you can be directly reached. If you still have difficulty reaching the provider, please page the [facility] (Director on Call) for the Hospitalists listed on amion for assistance.

## 2021-10-27 DIAGNOSIS — J9602 Acute respiratory failure with hypercapnia: Secondary | ICD-10-CM | POA: Diagnosis not present

## 2021-10-27 LAB — GLUCOSE, CAPILLARY
Glucose-Capillary: 97 mg/dL (ref 70–99)
Glucose-Capillary: 87 mg/dL (ref 70–99)
Glucose-Capillary: 117 mg/dL — ABNORMAL HIGH (ref 70–99)
Glucose-Capillary: 119 mg/dL — ABNORMAL HIGH (ref 70–99)
Glucose-Capillary: 93 mg/dL (ref 70–99)
Glucose-Capillary: 100 mg/dL — ABNORMAL HIGH (ref 70–99)

## 2021-10-27 NOTE — Plan of Care (Signed)

## 2021-10-27 NOTE — Progress Notes (Signed)
Mobility Specialist - Progress Note    10/27/21 1500  Mobility  Activity Transferred from chair to bed  Range of Motion/Exercises Active  Level of Assistance +2 (takes two people)  Company secretary asked for assistance with transfer. She was left with all necessities in reach.  Billey Chang Mobility Specialist

## 2021-10-27 NOTE — Progress Notes (Signed)
Pt seen, awake, alert, willing to wear bipap later tonight.  No increased wob/respiratory distress noted or voiced at this time.  Bipap remains in room on standby.

## 2021-10-27 NOTE — TOC Progression Note (Signed)
Transition of Care [facility]) - Progression Note    Patient Details  Name: Felicia Warren MRN: [id number] Date of Birth: 10/30/1970  Transition of Care [facility]) CM/SW Contact  Selyna Klahn, Olegario Messier, RN Phone Number: 10/27/2021, 1:01 PM  Clinical Narrative: Sherron Monday to APS worker Jerry-provided info on update-he has my contact info & will keep me informed of his findings. Will fax out for SNF.Continue to monitor.      Expected Discharge Plan: Homeless Shelter Barriers to Discharge: No Barriers Identified  Expected Discharge Plan and Services Expected Discharge Plan: Homeless Shelter       Living arrangements for the past 2 months: Homeless                                       Social Determinants of Health (SDOH) Interventions    Readmission Risk Interventions     No data to display

## 2021-10-27 NOTE — Plan of Care (Signed)
  Problem: Education: Goal: Knowledge of General Education information will improve Description: Including pain rating scale, medication(s)/side effects and non-pharmacologic comfort measures Outcome: Progressing   Problem: Health Behavior/Discharge Planning: Goal: Ability to manage health-related needs will improve Outcome: Progressing   Problem: Clinical Measurements: Goal: Ability to maintain clinical measurements within normal limits will improve Outcome: Progressing Goal: Will remain free from infection Outcome: Progressing Goal: Diagnostic test results will improve Outcome: Progressing Goal: Respiratory complications will improve Outcome: Progressing Goal: Cardiovascular complication will be avoided Outcome: Progressing   Problem: Activity: Goal: Risk for activity intolerance will decrease Outcome: Progressing  Pt ambulated today w assist x 2 & walker to & from restroom, then sat in chair for over an hour.

## 2021-10-27 NOTE — Progress Notes (Signed)
Pt refused Coreg, Dr. Jacqulyn Bath notified

## 2021-10-27 NOTE — Progress Notes (Signed)
PROGRESS NOTE    Mckaylah Gammell  NIO:[id number] DOB: 1970/02/28 DOA: 10/19/2021 PCP: Storm Frisk, MD   Brief Narrative:  Mrs. Barlowe was admitted to the hospital with the working diagnosis of acute hypercapnic and hypoxemic respiratory failure.   51 yo female with the past medical history of obesity hypoventilation syndrome, T2DM and heart failure who presented with respiratory failure. Patient was noted to be confused 48 hrs before her admission. Apparently she lives in a motel for the past 3 weeks, but no able to take care of herself or pay the bills. Her sister call the police to to a welfare check, she was found confused, covered in feces and urine. She was transported to the ED. On her initial physical examination she was able to speak but noted to be somnolent. She was placed on non invasive mechanical ventilation. Her blood pressure was 148/82, RR 22, HR 83 and 02 saturation 95%, patient somnolent but arousable to painful and verbal stimuli, lungs with decreased breath sounds bilaterally, heart with S1 and S2 present and rhythmic, abdomen not distended and positive lower extremity edema.    VBG 7,23/84/53/35/73% Na 146. K 4.2 Cl 102 bicarbonate 30 glucose 100 bun 27 cr 2,38  High sensitive troponin 199 and 209 BNP 778  Lactic acid 2,2  Wbc 9,7 hgb 12.8 plt 308  Urine analysis with SG 1,014, 100 protein, 21-50 wbc  Sars covid 19 negative  Toxicology screen negative    Chest radiograph with hypoinflation, positive cardiomegaly. No infiltrates.   EKG with 109 bpm, right axis, normal intervals, sinus rhythm, no significant ST segment or T wave changes, low voltage.    Patient failed non invasive mechanical ventilation and was intubated on 08/29. 08/31 patient extubated with good toleration 09/02 transferred to [facility].  09/03 patient tolerating well Bipap, at night and as needed during the day. She needs placement.  Per psychiatry patient does not have capacity to leave the hospital  ama.   Assessment & Plan:   Principal Problem:   Acute hypercapnic respiratory failure (HCC) Active Problems:   Acute on chronic heart failure with preserved ejection fraction (HCC)   Type 2 diabetes mellitus without complication (HCC)   Urinary tract infection without hematuria   Pulmonary embolism (HCC)   Class 3 obesity (HCC)  Acute hypoxic and hypercapnic respiratory failure (HCC) Obesity hypoventilation syndrome.  Continue BiPAP at night. Plan to continue with non invasive bilevel mechanical ventilation.  Will need home ventilator at the time of her discharge.  Continue with PT/OT.  Continue 3 L oxygen via nasal.  During the daytime Patient will need placement.    Acute on chronic heart failure with preserved ejection fraction (HCC) Echocardiogram with LV systolic function is preserved 60 to 65%, moderate LVH. Preserved RV systolic function. No significant valvular disease.  40 mg IV Lasix, coreg, jardiance.   Type 2 diabetes mellitus without complication (HCC): Controlled.  Continue SSI.   Urinary tract infection without hematuria Patient has completed antibiotic therapy for urinary tract infection.    Pulmonary embolism (HCC) History of pulmonary embolism, continue anticoagulation with rivaroxaban.    Class 3 obesity (HCC) Calculated BMI is 84,6 weight loss counseling provided.  Capacity: She was assessed by psychiatry and per them, she does not have the capacity to leave AGAINST MEDICAL ADVICE or make any medical decisions for self.  Family was concerned that she might try to leave AGAINST MEDICAL ADVICE again as she did from skilled nursing facility recently.  She in fact  tried to do so as well.  Patient is however not under IVC.  However, if she were to try to be aggressive and tried to leave, IVC should be activated immediately.   DVT prophylaxis: rivaroxaban (XARELTO) tablet 10 mg Start: 10/23/21 1300 SCDs Start: 10/19/21 2107   Code Status: Full Code  Family  Communication:  None present at bedside.  Updated Sister Arline Asp about current status yesterday.  No new updates today..  Status is: Inpatient Remains inpatient appropriate because: Medically stable, waiting for placement.   Estimated body mass index is 82.06 kg/m as calculated from the following:   Height as of this encounter: 5\' 1"  (1.549 m).   Weight as of this encounter: 197 kg.    Nutritional Assessment: Body mass index is 82.06 kg/m. Seen by dietician.  I agree with the assessment and plan as outlined below: Nutrition Status: Nutrition Problem: Inadequate oral intake Etiology: inability to eat Signs/Symptoms: NPO status Interventions: Premier Protein, MVI, Prostat  . Skin Assessment: I have examined the patient's skin and I agree with the wound assessment as performed by the wound care RN as outlined below:    Consultants:  Psychiatry  Procedures:  None  Antimicrobials:  Anti-infectives (From admission, onward)    Start     Dose/Rate Route Frequency Ordered Stop   10/22/21 2100  cefTRIAXone (ROCEPHIN) 1 g in sodium chloride 0.9 % 100 mL IVPB        1 g 200 mL/hr over 30 Minutes Intravenous Every 24 hours 10/22/21 0748 10/22/21 2203   10/20/21 2100  cefTRIAXone (ROCEPHIN) 1 g in sodium chloride 0.9 % 100 mL IVPB  Status:  Discontinued        1 g 200 mL/hr over 30 Minutes Intravenous Every 24 hours 10/19/21 2118 10/22/21 0748   10/19/21 2100  cefTRIAXone (ROCEPHIN) 2 g in sodium chloride 0.9 % 100 mL IVPB        2 g 200 mL/hr over 30 Minutes Intravenous  Once 10/19/21 2046 10/19/21 2238         Subjective:  Patient seen and examined.  She has no complaints.  Objective: Vitals:   10/26/21 2226 10/27/21 0335 10/27/21 0500 10/27/21 0945  BP:  (!) 110/55  (!) 116/52  Pulse: 79 81  79  Resp: (!) 24 18    Temp:  98.2 F (36.8 C)    TempSrc:  Oral    SpO2: 100% 95%  93%  Weight:   (!) 197 kg   Height:        Intake/Output Summary (Last 24 hours) at  10/27/2021 1135 Last data filed at 10/27/2021 0955 Gross per 24 hour  Intake 820 ml  Output 400 ml  Net 420 ml    Filed Weights   10/21/21 0339 10/22/21 0452 10/27/21 0500  Weight: (!) 204.1 kg (!) 203.2 kg (!) 197 kg    Examination:  General exam: Appears calm and comfortable, morbidly obese Respiratory system: Clear to auscultation. Respiratory effort normal. Cardiovascular system: S1 & S2 heard, RRR. No JVD, murmurs, rubs, gallops or clicks. No pedal edema. Gastrointestinal system: Abdomen is nondistended, soft and nontender. No organomegaly or masses felt. Normal bowel sounds heard. Central nervous system: Alert and oriented. No focal neurological deficits. Extremities: Symmetric 5 x 5 power. Skin: No rashes, lesions or ulcers.  Psychiatry: Judgement and insight appear normal. Mood & affect appropriate.     Data Reviewed: I have personally reviewed following labs and imaging studies  CBC: Recent Labs  Lab 10/21/21  [id number] 10/22/21 0307 10/23/21 0241 10/23/21 0409  WBC 5.5 5.2 5.4 5.3  HGB 11.5* 11.5* 11.4* 11.5*  HCT 42.1 41.4 42.4 41.8  MCV 82.1 81.0 83.1 82.3  PLT 220 190 151 160    Basic Metabolic Panel: Recent Labs  Lab 10/21/21 0258 10/21/21 0259 10/21/21 1306 10/21/21 1642 10/22/21 0307 10/22/21 1659 10/23/21 0409 10/24/21 0502 10/25/21 0518 10/26/21 0416  NA 147*  --   --   --  147*  --  147* 145 143 143  K 4.1  --   --   --  3.8  --  3.6 4.1 3.5 3.3*  CL 105  --   --   --  105  --  106 103 101 101  CO2 28  --   --   --  31  --  34* 35* 35* 37*  GLUCOSE 145*  --   --   --  147*  --  106* 91 106* 93  BUN 37*  --   --   --  47*  --  38* 29* 23* 16  CREATININE 1.95*  --   --   --  1.59*  --  1.08* 0.89 0.83 0.79  CALCIUM 8.3*  --   --   --  8.3*  --  8.0* 8.1* 8.3* 8.4*  MG  --    < > 2.2 2.2 2.3 2.4  --   --   --  1.9  PHOS 3.4  --  3.1 2.9 2.4* 2.8  --   --   --   --    < > = values in this interval not displayed.    GFR: Estimated Creatinine  Clearance: 138 mL/min (by C-G formula based on SCr of 0.79 mg/dL). Liver Function Tests: Recent Labs  Lab 10/21/21 0258 10/21/21 0259 10/22/21 0307 10/23/21 0409  AST  --  199* 152* 92*  ALT  --  108* 121* 103*  ALKPHOS  --  61 57 55  BILITOT  --  2.1* 1.6* 1.7*  PROT  --  6.4* 6.7 6.6  ALBUMIN 3.2* 3.1* 3.3* 3.2*    No results for input(s): "LIPASE", "AMYLASE" in the last 168 hours. Recent Labs  Lab 10/23/21 0241  AMMONIA 31    Coagulation Profile: Recent Labs  Lab 10/23/21 0241  INR 1.3*    Cardiac Enzymes: No results for input(s): "CKTOTAL", "CKMB", "CKMBINDEX", "TROPONINI" in the last 168 hours. BNP (last 3 results) No results for input(s): "PROBNP" in the last 8760 hours. HbA1C: No results for input(s): "HGBA1C" in the last 72 hours. CBG: Recent Labs  Lab 10/26/21 1942 10/26/21 2327 10/27/21 0331 10/27/21 0803 10/27/21 1126  GLUCAP 111* 91 97 87 117*    Lipid Profile: No results for input(s): "CHOL", "HDL", "LDLCALC", "TRIG", "CHOLHDL", "LDLDIRECT" in the last 72 hours. Thyroid Function Tests: Recent Labs    10/24/21 1934  TSH 3.980    Anemia Panel: Recent Labs    10/24/21 1934  VITAMINB12 615    Sepsis Labs: No results for input(s): "PROCALCITON", "LATICACIDVEN" in the last 168 hours.   Recent Results (from the past 240 hour(s))  Respiratory (~20 pathogens) panel by PCR     Status: None   Collection Time: 10/19/21  9:21 PM   Specimen: Nasopharyngeal Swab; Respiratory  Result Value Ref Range Status   Adenovirus NOT DETECTED NOT DETECTED Final   Coronavirus 229E NOT DETECTED NOT DETECTED Final    Comment: (NOTE) The Coronavirus on the Respiratory Panel, DOES  NOT test for the novel  Coronavirus (2019 nCoV)    Coronavirus HKU1 NOT DETECTED NOT DETECTED Final   Coronavirus NL63 NOT DETECTED NOT DETECTED Final   Coronavirus OC43 NOT DETECTED NOT DETECTED Final   Metapneumovirus NOT DETECTED NOT DETECTED Final   Rhinovirus / Enterovirus  NOT DETECTED NOT DETECTED Final   Influenza A NOT DETECTED NOT DETECTED Final   Influenza B NOT DETECTED NOT DETECTED Final   Parainfluenza Virus 1 NOT DETECTED NOT DETECTED Final   Parainfluenza Virus 2 NOT DETECTED NOT DETECTED Final   Parainfluenza Virus 3 NOT DETECTED NOT DETECTED Final   Parainfluenza Virus 4 NOT DETECTED NOT DETECTED Final   Respiratory Syncytial Virus NOT DETECTED NOT DETECTED Final   Bordetella pertussis NOT DETECTED NOT DETECTED Final   Bordetella Parapertussis NOT DETECTED NOT DETECTED Final   Chlamydophila pneumoniae NOT DETECTED NOT DETECTED Final   Mycoplasma pneumoniae NOT DETECTED NOT DETECTED Final    Comment: Performed at [facility], [address], [place], [place] [address]  Urine Culture     Status: Abnormal   Collection Time: 10/19/21  9:39 PM   Specimen: Urine, Catheterized  Result Value Ref Range Status   Specimen Description   Final    URINE, CATHETERIZED Performed at [facility], [address], [place], [place] [address]    Special Requests   Final    NONE Performed at [facility], [address], [place], [place] [address]    Culture (A)  Final    70,000 COLONIES/mL LACTOBACILLUS SPECIES Standardized susceptibility testing for this organism is not available. Performed at [facility], [address] [place], [place] [address]    Report Status 10/21/2021 FINAL  Final  SARS Coronavirus 2 by RT PCR (hospital order, performed in [facility] hospital lab) *cepheid single result test*     Status: None   Collection Time: 10/19/21 11:12 PM  Result Value Ref Range Status   SARS Coronavirus 2 by RT PCR NEGATIVE NEGATIVE Final    Comment: (NOTE) SARS-CoV-2 target nucleic acids are NOT DETECTED.  The SARS-CoV-2 RNA is generally detectable in upper and lower respiratory specimens during the acute phase of infection. The lowest concentration of SARS-CoV-2 viral copies this assay can detect is  250 copies / mL. A negative result does not preclude SARS-CoV-2 infection and should not be used as the sole basis for treatment or other patient management decisions.  A negative result may occur with improper specimen collection / handling, submission of specimen other than nasopharyngeal swab, presence of viral mutation(s) within the areas targeted by this assay, and inadequate number of viral copies (<250 copies / mL). A negative result must be combined with clinical observations, patient history, and epidemiological information.  Fact Sheet for Patients:   RoadLapTop.co.za  Fact Sheet for Healthcare Providers: http://kim-miller.com/  This test is not yet approved or  cleared by the [place] FDA and has been authorized for detection and/or diagnosis of SARS-CoV-2 by FDA under an Emergency Use Authorization (EUA).  This EUA will remain in effect (meaning this test can be used) for the duration of the COVID-19 declaration under Section 564(b)(1) of the Act, 21 U.S.C. section 360bbb-3(b)(1), unless the authorization is terminated or revoked sooner.  Performed at [facility], [address] [place], [place] [address]   MRSA Next Gen by PCR, Nasal     Status: None   Collection Time: 10/20/21  1:33 AM   Specimen:  Nasal Mucosa; Nasal Swab  Result Value Ref Range Status   MRSA by PCR Next Gen NOT DETECTED NOT DETECTED Final    Comment: (NOTE) The GeneXpert MRSA Assay (FDA approved for NASAL specimens only), is one component of a comprehensive MRSA colonization surveillance program. It is not intended to diagnose MRSA infection nor to guide or monitor treatment for MRSA infections. Test performance is not FDA approved in patients less than 79 years old. Performed at [facility], [address], [place], [place] [address]      Radiology Studies: No results found.  Scheduled Meds:   (feeding supplement) PROSource Plus  30 mL Oral BID BM   carvedilol  3.125 mg Oral BID WC   Chlorhexidine Gluconate Cloth  6 each Topical Q0600   empagliflozin  10 mg Oral Daily   furosemide  40 mg Intravenous Daily   insulin aspart  0-15 Units Subcutaneous Q4H   multivitamin with minerals  1 tablet Oral Daily   mouth rinse  15 mL Mouth Rinse 4 times per day   Ensure Max Protein  11 oz Oral BID   rivaroxaban  10 mg Oral Daily   Continuous Infusions:   LOS: 8 days   Hughie Closs, MD [facility]  10/27/2021, 11:35 AM   *Please note that this is a verbal dictation therefore any spelling or grammatical errors are due to the "Dragon Medical One" system interpretation.  Please page via Amion and do not message via secure chat for urgent patient care matters. Secure chat can be used for non urgent patient care matters.  How to contact the [facility] Attending or Consulting provider 7A - 7P or covering provider during after hours 7P -7A, for this patient?  Check the care team in [facility] and look for a) attending/consulting TRH provider listed and b) the [facility] team listed. Page or secure chat 7A-7P. Log into [email or web address] and use [place]'s universal password to access. If you do not have the password, please contact the hospital operator. Locate the [facility] provider you are looking for under [facility] and page to a number that you can be directly reached. If you still have difficulty reaching the provider, please page the [facility] (Director on Call) for the Hospitalists listed on amion for assistance.

## 2021-10-27 NOTE — [place] FL2 (Signed)
[place] MEDICAID FL2 LEVEL OF CARE SCREENING TOOL     IDENTIFICATION  Patient Name: Felicia Warren Birthdate: January 21, 1971 Sex: female Admission Date (Current Location): 10/19/2021  [facility] and [place] Number:  Producer, television/film/video and Address:  [facility],  [address] [place] [address]      Provider Number: [id number]  Attending Physician Name and Address:  Hughie Closs, MD  Relative Name and Phone Number:  Filiberto Pinks, [phone]    Current Level of Care: Hospital Recommended Level of Care: Skilled Nursing Facility Prior Approval Number:    Date Approved/Denied:   PASRR Number: [id number]  Discharge Plan: SNF    Current Diagnoses: Patient Active Problem List   Diagnosis Date Noted   Class 3 obesity (HCC) 10/24/2021   Pulmonary embolism (HCC) 10/24/2021   Acute on chronic heart failure with preserved ejection fraction (HCC)    Type 2 diabetes mellitus without complication (HCC)    Urinary tract infection without hematuria    Acute hypercapnic respiratory failure (HCC) 10/19/2021   Hypokalemia and hypomagnesemia 06/20/2021   Hypomagnesemia 06/20/2021   Physical deconditioning 06/18/2021   Iron deficiency anemia 06/18/2021   Chronic diastolic CHF (congestive heart failure) (HCC) 06/18/2021   Involuntary commitment 06/17/2021   Self neglect 06/17/2021   OSA/OHS on CPAP 06/17/2021   Acute respiratory failure with hypoxia (HCC) 06/16/2021   Acute on chronic respiratory failure with hypoxia and hypercapnia (HCC) 06/16/2021   BMI 60.0-69.9, adult (HCC) 09/17/2020   Prediabetes 09/17/2020   Hypokalemia    Acute metabolic encephalopathy 07/05/2020   History of pulmonary embolism 07/05/2020    Orientation RESPIRATION BLADDER Height & Weight     Self, Time, Situation, Place  Other (Comment), O2 (3LNC continous, bipap at night) Continent Weight: (!) 197 kg Height:  5\' 1"  (154.9 cm)  BEHAVIORAL SYMPTOMS/MOOD NEUROLOGICAL BOWEL NUTRITION  STATUS     (n/a) Continent (S) Diet (carb modified)  AMBULATORY STATUS COMMUNICATION OF NEEDS Skin   Extensive Assist Verbally Skin abrasions (abrasion to coccyx, barrier cream; rash bil arms and hips)                       Personal Care Assistance Level of Assistance  Bathing, Dressing Bathing Assistance: Maximum assistance Feeding assistance: Independent Dressing Assistance: Limited assistance     Functional Limitations Info  Sight, Hearing, Speech Sight Info: Adequate Hearing Info: Adequate Speech Info: Adequate    SPECIAL CARE FACTORS FREQUENCY  PT (By licensed PT)     PT Frequency: 5 x week OT Frequency: 5 x week            Contractures Contractures Info: Not present    Additional Factors Info  Code Status, Allergies Code Status Info:  (full code) Allergies Info: nka Psychotropic Info: none         Current Medications (10/27/2021):  This is the current hospital active medication list Current Facility-Administered Medications  Medication Dose Route Frequency Provider Last Rate Last Admin   (feeding supplement) PROSource Plus liquid 30 mL  30 mL Oral BID BM 12/27/2021, DO   30 mL at 10/26/21 0901   albuterol (PROVENTIL) (2.5 MG/3ML) 0.083% nebulizer solution 2.5 mg  2.5 mg Nebulization Q4H PRN 12/26/21, DO   2.5 mg at 10/20/21 0247   carvedilol (COREG) tablet 3.125 mg  3.125 mg Oral BID WC 10/22/21, MD   3.125 mg at 10/27/21 0949   Chlorhexidine Gluconate Cloth 2 % PADS 6 each  6 each Topical Q0600 Briant Sites, DO   6 each at 10/27/21 0500   diphenhydrAMINE-zinc acetate (BENADRYL) 2-0.1 % cream   Topical BID PRN Duayne Cal, NP   Given at 10/22/21 1457   docusate sodium (COLACE) capsule 100 mg  100 mg Oral BID PRN Briant Sites, DO       empagliflozin (JARDIANCE) tablet 10 mg  10 mg Oral Daily Arrien, York Ram, MD   10 mg at 10/27/21 0949   furosemide (LASIX) injection 40 mg  40 mg Intravenous Daily Coralie Keens, MD   40 mg at 10/27/21 0949   insulin aspart (novoLOG) injection 0-15 Units  0-15 Units Subcutaneous Q4H Duayne Cal, NP   2 Units at 10/26/21 1254   multivitamin with minerals tablet 1 tablet  1 tablet Oral Daily Briant Sites, DO   1 tablet at 10/27/21 0102   Oral care mouth rinse  15 mL Mouth Rinse 4 times per day Olalere, Adewale A, MD   15 mL at 10/27/21 0951   polyethylene glycol (MIRALAX / GLYCOLAX) packet 17 g  17 g Oral Daily PRN Briant Sites, DO       protein supplement (ENSURE MAX) liquid  11 oz Oral BID Briant Sites, DO   11 oz at 10/27/21 0950   rivaroxaban (XARELTO) tablet 10 mg  10 mg Oral Daily Oretha Milch, MD   10 mg at 10/27/21 7253     Discharge Medications: Please see discharge summary for a list of discharge medications.  Relevant Imaging Results:  Relevant Lab Results:   Additional Information  (SSN [id number])  Adrian Prows, RN

## 2021-10-27 NOTE — TOC Progression Note (Signed)
Transition of Care (TOC) - Progression Note :   Patient Details  Name: Judene Logue MRN: [id number] Date of Birth: May 27, 1970  Transition of Care [facility]) CM/SW Contact  Adrian Prows, RN Phone Number: 10/27/2021, 3:30 PM  Clinical Narrative:   Clovis Cao, facesheet, and H&P faxed; [facility] [place] Admit Coordinator, Amelia requested information be faxed; confirmation received; awaiting bed offers    Expected Discharge Plan: Homeless Shelter Barriers to Discharge: No Barriers Identified  Expected Discharge Plan and Services Expected Discharge Plan: Homeless Shelter       Living arrangements for the past 2 months: Homeless                                       Social Determinants of Health (SDOH) Interventions    Readmission Risk Interventions     No data to display

## 2021-10-28 DIAGNOSIS — J9602 Acute respiratory failure with hypercapnia: Secondary | ICD-10-CM | POA: Diagnosis not present

## 2021-10-28 LAB — GLUCOSE, CAPILLARY
Glucose-Capillary: 84 mg/dL (ref 70–99)
Glucose-Capillary: 86 mg/dL (ref 70–99)
Glucose-Capillary: 107 mg/dL — ABNORMAL HIGH (ref 70–99)
Glucose-Capillary: 89 mg/dL (ref 70–99)
Glucose-Capillary: 98 mg/dL (ref 70–99)

## 2021-10-28 NOTE — Progress Notes (Signed)
Physical Therapy Treatment Patient Details Name: Felicia Warren MRN: [id number] DOB: 12-01-1970 Today's Date: 10/28/2021   History of Present Illness HPI: 51 yo female with pmh dm2, hfpEF, chronic osa non adherent with home niv. [employer], FTT - drowsy found to be hypercarbic with AKI, acute liver injury, and elevated bnp/trop with known HFpef, morbid obesity.   Intubated 8/28, self extubated 8/31. Pt's CXR negative for pna    PT Comments    Pt in bed on 3 lts sats 99%.  Trial removed monitoring RA.  Assisted OOB to amb to bathroom RA decreased to 83% required 2 lts to achieve sats >90%.  General Gait Details: pt was able to amb to and from bathroom a total of 24 feet in room with walker. Pt self able to perform peri care standing.   Pt couls have amb more in hallway but her Breakfast just arrived and she didn't want it ti get cold. Per LPT eval, rec SNF  Recommendations for follow up therapy are one component of a multi-disciplinary discharge planning process, led by the attending physician.  Recommendations may be updated based on patient status, additional functional criteria and insurance authorization.  Follow Up Recommendations  Skilled nursing-short term rehab (<3 hours/day) Can patient physically be transported by private vehicle: No   Assistance Recommended at Discharge Frequent or constant Supervision/Assistance  Patient can return home with the following Two people to help with walking and/or transfers;Two people to help with bathing/dressing/bathroom;Assistance with cooking/housework;Assist for transportation;Help with stairs or ramp for entrance   Equipment Recommendations  None recommended by PT    Recommendations for Other Services       Precautions / Restrictions Precautions Precautions: Fall Restrictions Weight Bearing Restrictions: No     Mobility  Bed Mobility Overal bed mobility: Needs Assistance Bed Mobility: Rolling, Supine to Sit     Supine to sit:  Supervision     General bed mobility comments: self able with increased time    Transfers Overall transfer level: Needs assistance Equipment used: Rolling walker (2 wheels) Transfers: Sit to/from Stand Sit to Stand: Supervision           General transfer comment: self able to rise from bed as well as toilet in bathroom    Ambulation/Gait Ambulation/Gait assistance: Supervision Gait Distance (Feet): 24 Feet (12 feet x 2) Assistive device: Rolling walker (2 wheels) Gait Pattern/deviations: Step-to pattern, Step-through pattern, Decreased step length - right, Decreased step length - left, Shuffle, Trunk flexed, Wide base of support Gait velocity: decr     General Gait Details: pt was able to amb to and from bathroom a total of 24 feet in room with walker. Pt self able to perform peri care standing.   Pt couls have amb more in hallway but her Breakfast just arrived and she didn't want it ti get cold.   Stairs             Wheelchair Mobility    Modified Rankin (Stroke Patients Only)       Balance                                            Cognition Arousal/Alertness: Awake/alert Behavior During Therapy: WFL for tasks assessed/performed Overall Cognitive Status: Within Functional Limits for tasks assessed  General Comments: AxO x 3 pleasant        Exercises      General Comments        Pertinent Vitals/Pain Pain Assessment Pain Assessment: Faces Faces Pain Scale: Hurts a little bit Pain Location: general / stiffness Pain Descriptors / Indicators: Tightness, Aching Pain Intervention(s): Monitored during session    Home Living                          Prior Function            PT Goals (current goals can now be found in the care plan section) Progress towards PT goals: Progressing toward goals    Frequency    Min 3X/week      PT Plan Current plan remains  appropriate    Co-evaluation              AM-PAC PT "6 Clicks" Mobility   Outcome Measure  Help needed turning from your back to your side while in a flat bed without using bedrails?: None Help needed moving from lying on your back to sitting on the side of a flat bed without using bedrails?: None Help needed moving to and from a bed to a chair (including a wheelchair)?: None Help needed standing up from a chair using your arms (e.g., wheelchair or bedside chair)?: A Little Help needed to walk in hospital room?: A Little Help needed climbing 3-5 steps with a railing? : A Lot 6 Click Score: 20    End of Session   Activity Tolerance: Patient tolerated treatment well Patient left: in chair;with call bell/phone within reach;with chair alarm set Nurse Communication: Mobility status PT Visit Diagnosis: Difficulty in walking, not elsewhere classified (R26.2)     Time: 1115-1140 PT Time Calculation (min) (ACUTE ONLY): 25 min  Charges:  $Gait Training: 8-22 mins $Therapeutic Activity: 8-22 mins                     {Tonja Jezewski  PTA Acute  [employer] M-F          [phone] Weekend pager [phone]

## 2021-10-28 NOTE — Progress Notes (Signed)
PROGRESS NOTE    Felicia Warren  NIO:[id number] DOB: 1970/02/28 DOA: 10/19/2021 PCP: Storm Frisk, MD   Brief Narrative:  Mrs. Barlowe was admitted to the hospital with the working diagnosis of acute hypercapnic and hypoxemic respiratory failure.   51 yo female with the past medical history of obesity hypoventilation syndrome, T2DM and heart failure who presented with respiratory failure. Patient was noted to be confused 48 hrs before her admission. Apparently she lives in a motel for the past 3 weeks, but no able to take care of herself or pay the bills. Her sister call the police to to a welfare check, she was found confused, covered in feces and urine. She was transported to the ED. On her initial physical examination she was able to speak but noted to be somnolent. She was placed on non invasive mechanical ventilation. Her blood pressure was 148/82, RR 22, HR 83 and 02 saturation 95%, patient somnolent but arousable to painful and verbal stimuli, lungs with decreased breath sounds bilaterally, heart with S1 and S2 present and rhythmic, abdomen not distended and positive lower extremity edema.    VBG 7,23/84/53/35/73% Na 146. K 4.2 Cl 102 bicarbonate 30 glucose 100 bun 27 cr 2,38  High sensitive troponin 199 and 209 BNP 778  Lactic acid 2,2  Wbc 9,7 hgb 12.8 plt 308  Urine analysis with SG 1,014, 100 protein, 21-50 wbc  Sars covid 19 negative  Toxicology screen negative    Chest radiograph with hypoinflation, positive cardiomegaly. No infiltrates.   EKG with 109 bpm, right axis, normal intervals, sinus rhythm, no significant ST segment or T wave changes, low voltage.    Patient failed non invasive mechanical ventilation and was intubated on 08/29. 08/31 patient extubated with good toleration 09/02 transferred to [facility].  09/03 patient tolerating well Bipap, at night and as needed during the day. She needs placement.  Per psychiatry patient does not have capacity to leave the hospital  ama.   Assessment & Plan:   Principal Problem:   Acute hypercapnic respiratory failure (HCC) Active Problems:   Acute on chronic heart failure with preserved ejection fraction (HCC)   Type 2 diabetes mellitus without complication (HCC)   Urinary tract infection without hematuria   Pulmonary embolism (HCC)   Class 3 obesity (HCC)  Acute hypoxic and hypercapnic respiratory failure (HCC) Obesity hypoventilation syndrome.  Continue BiPAP at night. Plan to continue with non invasive bilevel mechanical ventilation.  Will need home ventilator at the time of her discharge.  Continue with PT/OT.  Continue 3 L oxygen via nasal.  During the daytime Patient will need placement.    Acute on chronic heart failure with preserved ejection fraction (HCC) Echocardiogram with LV systolic function is preserved 60 to 65%, moderate LVH. Preserved RV systolic function. No significant valvular disease.  40 mg IV Lasix, coreg, jardiance.   Type 2 diabetes mellitus without complication (HCC): Controlled.  Continue SSI.   Urinary tract infection without hematuria Patient has completed antibiotic therapy for urinary tract infection.    Pulmonary embolism (HCC) History of pulmonary embolism, continue anticoagulation with rivaroxaban.    Class 3 obesity (HCC) Calculated BMI is 84,6 weight loss counseling provided.  Capacity: She was assessed by psychiatry and per them, she does not have the capacity to leave AGAINST MEDICAL ADVICE or make any medical decisions for self.  Family was concerned that she might try to leave AGAINST MEDICAL ADVICE again as she did from skilled nursing facility recently.  She in fact  tried to do so as well.  Patient is however not under IVC.  However, if she were to try to be aggressive and tried to leave, IVC should be activated immediately.   DVT prophylaxis: rivaroxaban (XARELTO) tablet 10 mg Start: 10/23/21 1300 SCDs Start: 10/19/21 2107   Code Status: Full Code  Family  Communication:  None present at bedside.    Status is: Inpatient Remains inpatient appropriate because: Medically stable, waiting for placement.   Estimated body mass index is 82.06 kg/m as calculated from the following:   Height as of this encounter: 5\' 1"  (1.549 m).   Weight as of this encounter: 197 kg.    Nutritional Assessment: Body mass index is 82.06 kg/m. Seen by dietician.  I agree with the assessment and plan as outlined below: Nutrition Status: Nutrition Problem: Inadequate oral intake Etiology: inability to eat Signs/Symptoms: NPO status Interventions: Premier Protein, MVI, Prostat  . Skin Assessment: I have examined the patient's skin and I agree with the wound assessment as performed by the wound care RN as outlined below:    Consultants:  Psychiatry  Procedures:  None  Antimicrobials:  Anti-infectives (From admission, onward)    Start     Dose/Rate Route Frequency Ordered Stop   10/22/21 2100  cefTRIAXone (ROCEPHIN) 1 g in sodium chloride 0.9 % 100 mL IVPB        1 g 200 mL/hr over 30 Minutes Intravenous Every 24 hours 10/22/21 0748 10/22/21 2203   10/20/21 2100  cefTRIAXone (ROCEPHIN) 1 g in sodium chloride 0.9 % 100 mL IVPB  Status:  Discontinued        1 g 200 mL/hr over 30 Minutes Intravenous Every 24 hours 10/19/21 2118 10/22/21 0748   10/19/21 2100  cefTRIAXone (ROCEPHIN) 2 g in sodium chloride 0.9 % 100 mL IVPB        2 g 200 mL/hr over 30 Minutes Intravenous  Once 10/19/21 2046 10/19/21 2238         Subjective:  I seen and examined.  No complaints.  Objective: Vitals:   10/27/21 2034 10/27/21 2321 10/28/21 0212 10/28/21 0350  BP: (!) 132/51   (!) 110/45  Pulse: 85 82 66 72  Resp: 18 18 16 20   Temp: 98.2 F (36.8 C)   97.6 F (36.4 C)  TempSrc:    Oral  SpO2: 92% 97% 94% 99%  Weight:      Height:        Intake/Output Summary (Last 24 hours) at 10/28/2021 1112 Last data filed at 10/28/2021 0406 Gross per 24 hour  Intake 240 ml   Output 1600 ml  Net -1360 ml    Filed Weights   10/21/21 0339 10/22/21 0452 10/27/21 0500  Weight: (!) 204.1 kg (!) 203.2 kg (!) 197 kg    Examination:  General exam: Appears calm and comfortable, morbidly obese Respiratory system: Clear to auscultation. Respiratory effort normal. Cardiovascular system: S1 & S2 heard, RRR. No JVD, murmurs, rubs, gallops or clicks.  Massive bilateral lower extremity lymphedema Gastrointestinal system: Abdomen is nondistended, soft and nontender. No organomegaly or masses felt. Normal bowel sounds heard. Central nervous system: Alert and oriented. No focal neurological deficits. Extremities: Symmetric 5 x 5 power. Skin: No rashes, lesions or ulcers.  Psychiatry: Judgement and insight appear normal. Mood & affect appropriate.    Data Reviewed: I have personally reviewed following labs and imaging studies  CBC: Recent Labs  Lab 10/22/21 0307 10/23/21 0241 10/23/21 0409  WBC 5.2 5.4 5.3  HGB 11.5* 11.4* 11.5*  HCT 41.4 42.4 41.8  MCV 81.0 83.1 82.3  PLT 190 151 160    Basic Metabolic Panel: Recent Labs  Lab 10/21/21 1306 10/21/21 1642 10/22/21 0307 10/22/21 1659 10/23/21 0409 10/24/21 0502 10/25/21 0518 10/26/21 0416  NA  --   --  147*  --  147* 145 143 143  K  --   --  3.8  --  3.6 4.1 3.5 3.3*  CL  --   --  105  --  106 103 101 101  CO2  --   --  31  --  34* 35* 35* 37*  GLUCOSE  --   --  147*  --  106* 91 106* 93  BUN  --   --  47*  --  38* 29* 23* 16  CREATININE  --   --  1.59*  --  1.08* 0.89 0.83 0.79  CALCIUM  --   --  8.3*  --  8.0* 8.1* 8.3* 8.4*  MG 2.2 2.2 2.3 2.4  --   --   --  1.9  PHOS 3.1 2.9 2.4* 2.8  --   --   --   --     GFR: Estimated Creatinine Clearance: 138 mL/min (by C-G formula based on SCr of 0.79 mg/dL). Liver Function Tests: Recent Labs  Lab 10/22/21 0307 10/23/21 0409  AST 152* 92*  ALT 121* 103*  ALKPHOS 57 55  BILITOT 1.6* 1.7*  PROT 6.7 6.6  ALBUMIN 3.3* 3.2*    No results for  input(s): "LIPASE", "AMYLASE" in the last 168 hours. Recent Labs  Lab 10/23/21 0241  AMMONIA 31    Coagulation Profile: Recent Labs  Lab 10/23/21 0241  INR 1.3*    Cardiac Enzymes: No results for input(s): "CKTOTAL", "CKMB", "CKMBINDEX", "TROPONINI" in the last 168 hours. BNP (last 3 results) No results for input(s): "PROBNP" in the last 8760 hours. HbA1C: No results for input(s): "HGBA1C" in the last 72 hours. CBG: Recent Labs  Lab 10/27/21 1645 10/27/21 2031 10/27/21 2307 10/28/21 0345 10/28/21 0807  GLUCAP 119* 93 100* 84 86    Lipid Profile: No results for input(s): "CHOL", "HDL", "LDLCALC", "TRIG", "CHOLHDL", "LDLDIRECT" in the last 72 hours. Thyroid Function Tests: No results for input(s): "TSH", "T4TOTAL", "FREET4", "T3FREE", "THYROIDAB" in the last 72 hours.  Anemia Panel: No results for input(s): "VITAMINB12", "FOLATE", "FERRITIN", "TIBC", "IRON", "RETICCTPCT" in the last 72 hours.  Sepsis Labs: No results for input(s): "PROCALCITON", "LATICACIDVEN" in the last 168 hours.   Recent Results (from the past 240 hour(s))  Respiratory (~20 pathogens) panel by PCR     Status: None   Collection Time: 10/19/21  9:21 PM   Specimen: Nasopharyngeal Swab; Respiratory  Result Value Ref Range Status   Adenovirus NOT DETECTED NOT DETECTED Final   Coronavirus 229E NOT DETECTED NOT DETECTED Final    Comment: (NOTE) The Coronavirus on the Respiratory Panel, DOES NOT test for the novel  Coronavirus (2019 nCoV)    Coronavirus HKU1 NOT DETECTED NOT DETECTED Final   Coronavirus NL63 NOT DETECTED NOT DETECTED Final   Coronavirus OC43 NOT DETECTED NOT DETECTED Final   Metapneumovirus NOT DETECTED NOT DETECTED Final   Rhinovirus / Enterovirus NOT DETECTED NOT DETECTED Final   Influenza A NOT DETECTED NOT DETECTED Final   Influenza B NOT DETECTED NOT DETECTED Final   Parainfluenza Virus 1 NOT DETECTED NOT DETECTED Final   Parainfluenza Virus 2 NOT DETECTED NOT DETECTED  Final   Parainfluenza  Virus 3 NOT DETECTED NOT DETECTED Final   Parainfluenza Virus 4 NOT DETECTED NOT DETECTED Final   Respiratory Syncytial Virus NOT DETECTED NOT DETECTED Final   Bordetella pertussis NOT DETECTED NOT DETECTED Final   Bordetella Parapertussis NOT DETECTED NOT DETECTED Final   Chlamydophila pneumoniae NOT DETECTED NOT DETECTED Final   Mycoplasma pneumoniae NOT DETECTED NOT DETECTED Final    Comment: Performed at [facility], [address], [place], [place] [address]  Urine Culture     Status: Abnormal   Collection Time: 10/19/21  9:39 PM   Specimen: Urine, Catheterized  Result Value Ref Range Status   Specimen Description   Final    URINE, CATHETERIZED Performed at [facility], [address] [place], [place] [address]    Special Requests   Final    NONE Performed at [facility], [address] [place], [place] [address]    Culture (A)  Final    70,000 COLONIES/mL LACTOBACILLUS SPECIES Standardized susceptibility testing for this organism is not available. Performed at [facility], [address], [place], [place] [address]    Report Status 10/21/2021 FINAL  Final  SARS Coronavirus 2 by RT PCR (hospital order, performed in [facility] hospital lab) *cepheid single result test*     Status: None   Collection Time: 10/19/21 11:12 PM  Result Value Ref Range Status   SARS Coronavirus 2 by RT PCR NEGATIVE NEGATIVE Final    Comment: (NOTE) SARS-CoV-2 target nucleic acids are NOT DETECTED.  The SARS-CoV-2 RNA is generally detectable in upper and lower respiratory specimens during the acute phase of infection. The lowest concentration of SARS-CoV-2 viral copies this assay can detect is 250 copies / mL. A negative result does not preclude SARS-CoV-2 infection and should not be used as the sole basis for treatment or other patient management decisions.  A negative result may occur with improper specimen collection  / handling, submission of specimen other than nasopharyngeal swab, presence of viral mutation(s) within the areas targeted by this assay, and inadequate number of viral copies (<250 copies / mL). A negative result must be combined with clinical observations, patient history, and epidemiological information.  Fact Sheet for Patients:   RoadLapTop.co.za  Fact Sheet for Healthcare Providers: http://kim-miller.com/  This test is not yet approved or  cleared by the [place] FDA and has been authorized for detection and/or diagnosis of SARS-CoV-2 by FDA under an Emergency Use Authorization (EUA).  This EUA will remain in effect (meaning this test can be used) for the duration of the COVID-19 declaration under Section 564(b)(1) of the Act, 21 U.S.C. section 360bbb-3(b)(1), unless the authorization is terminated or revoked sooner.  Performed at [facility], [address] [place], [place] [address]   MRSA Next Gen by PCR, Nasal     Status: None   Collection Time: 10/20/21  1:33 AM   Specimen: Nasal Mucosa; Nasal Swab  Result Value Ref Range Status   MRSA by PCR Next Gen NOT DETECTED NOT DETECTED Final    Comment: (NOTE) The GeneXpert MRSA Assay (FDA approved for NASAL specimens only), is one component of a comprehensive MRSA colonization surveillance program. It is not intended to diagnose MRSA infection nor to guide or monitor treatment for MRSA infections. Test performance is not FDA approved in patients less than 14 years old. Performed at [facility], [address], [place], [place] [address]      Radiology Studies:  No results found.  Scheduled Meds:  (feeding supplement) PROSource Plus  30 mL Oral BID BM   carvedilol  3.125 mg Oral BID WC   Chlorhexidine Gluconate Cloth  6 each Topical Q0600   empagliflozin  10 mg Oral Daily   furosemide  40 mg Intravenous Daily   insulin aspart   0-15 Units Subcutaneous Q4H   multivitamin with minerals  1 tablet Oral Daily   mouth rinse  15 mL Mouth Rinse 4 times per day   Ensure Max Protein  11 oz Oral BID   rivaroxaban  10 mg Oral Daily   Continuous Infusions:   LOS: 9 days   Hughie Closs, MD [facility]  10/28/2021, 11:12 AM   *Please note that this is a verbal dictation therefore any spelling or grammatical errors are due to the "Dragon Medical One" system interpretation.  Please page via Amion and do not message via secure chat for urgent patient care matters. Secure chat can be used for non urgent patient care matters.  How to contact the [facility] Attending or Consulting provider 7A - 7P or covering provider during after hours 7P -7A, for this patient?  Check the care team in [facility] and look for a) attending/consulting TR[facility] provider listed and b) the [facility] team listed. Page or secure chat 7A-7P. Log into [email or web address] and use [place] universal password to access. If you do not have the password, please contact the hospital operator. Locate the [facility] provider you are looking for under [facility] and page to a number that you can be directly reached. If you still have difficulty reaching the provider, please page the [facility] (Director on Call) for the Hospitalists listed on amion for assistance.

## 2021-10-29 DIAGNOSIS — J9602 Acute respiratory failure with hypercapnia: Secondary | ICD-10-CM | POA: Diagnosis not present

## 2021-10-29 LAB — BASIC METABOLIC PANEL
Anion gap: 7 (ref 5–15)
BUN: 11 mg/dL (ref 6–20)
CO2: 37 mmol/L — ABNORMAL HIGH (ref 22–32)
Calcium: 8 mg/dL — ABNORMAL LOW (ref 8.9–10.3)
Chloride: 99 mmol/L (ref 98–111)
Creatinine, Ser: 0.79 mg/dL (ref 0.44–1.00)
GFR, Estimated: >60 mL/min (ref >60)
Glucose, Bld: 102 mg/dL — ABNORMAL HIGH (ref 70–99)
Potassium: 2.9 mmol/L — ABNORMAL LOW (ref 3.5–5.1)
Sodium: 143 mmol/L (ref 135–145)

## 2021-10-29 LAB — GLUCOSE, CAPILLARY
Glucose-Capillary: 103 mg/dL — ABNORMAL HIGH (ref 70–99)
Glucose-Capillary: 106 mg/dL — ABNORMAL HIGH (ref 70–99)
Glucose-Capillary: 79 mg/dL (ref 70–99)
Glucose-Capillary: 84 mg/dL (ref 70–99)
Glucose-Capillary: 87 mg/dL (ref 70–99)
Glucose-Capillary: 94 mg/dL (ref 70–99)

## 2021-10-29 LAB — CBC WITH DIFFERENTIAL/PLATELET
Abs Immature Granulocytes: 0.03 10*3/uL (ref 0.00–0.07)
Basophils Absolute: 0 10*3/uL (ref 0.0–0.1)
Basophils Relative: 0 %
Eosinophils Absolute: 0.4 10*3/uL (ref 0.0–0.5)
Eosinophils Relative: 5 %
HCT: 41.6 % (ref 36.0–46.0)
Hemoglobin: 11.5 g/dL — ABNORMAL LOW (ref 12.0–15.0)
Immature Granulocytes: 1 %
Lymphocytes Relative: 13 %
Lymphs Abs: 0.8 10*3/uL (ref 0.7–4.0)
MCH: 22.4 pg — ABNORMAL LOW (ref 26.0–34.0)
MCHC: 27.6 g/dL — ABNORMAL LOW (ref 30.0–36.0)
MCV: 80.9 fL (ref 80.0–100.0)
Monocytes Absolute: 0.5 10*3/uL (ref 0.1–1.0)
Monocytes Relative: 7 %
Neutro Abs: 4.9 10*3/uL (ref 1.7–7.7)
Neutrophils Relative %: 74 %
Platelets: 163 10*3/uL (ref 150–400)
RBC: 5.14 MIL/uL — ABNORMAL HIGH (ref 3.87–5.11)
RDW: 22.7 % — ABNORMAL HIGH (ref 11.5–15.5)
WBC: 6.6 10*3/uL (ref 4.0–10.5)
nRBC: 0 % (ref 0.0–0.2)

## 2021-10-29 LAB — MAGNESIUM: Magnesium: 1.6 mg/dL — ABNORMAL LOW (ref 1.7–2.4)

## 2021-10-29 MED ORDER — FUROSEMIDE 40 MG PO TABS
40.0000 mg | ORAL_TABLET | Freq: Two times a day (BID) | ORAL | Status: DC
  Administered 2021-10-29 – 2021-11-27 (×58): 40 mg via ORAL
  Filled 2021-10-29 (×58): qty 1

## 2021-10-29 MED ORDER — SODIUM CHLORIDE 0.9 % IV SOLN: INTRAVENOUS | Status: DC | PRN

## 2021-10-29 MED ORDER — MAGNESIUM SULFATE 2 GM/50ML IV SOLN
2.0000 g | Freq: Once | INTRAVENOUS | Status: AC
Start: 2021-10-29 — End: 2021-10-30
  Administered 2021-10-29: 2 g via INTRAVENOUS
  Filled 2021-10-29: qty 50

## 2021-10-29 MED ORDER — POTASSIUM CHLORIDE CRYS ER 20 MEQ PO TBCR
40.0000 meq | EXTENDED_RELEASE_TABLET | ORAL | Status: AC
  Administered 2021-10-29 (×3): 40 meq via ORAL
  Filled 2021-10-29 (×3): qty 2

## 2021-10-29 NOTE — Progress Notes (Addendum)
PROGRESS NOTE    Fredna Stegall  OBS:[id number] DOB: [birth year] DOA: 10/19/2021 PCP: Storm Frisk, MD   Brief Narrative:  Mrs. Mccarrell was admitted to the hospital with the working diagnosis of acute hypercapnic and hypoxemic respiratory failure.   51 yo female with the past medical history of obesity hypoventilation syndrome, T2DM and heart failure who presented with respiratory failure. Patient was noted to be confused 48 hrs before her admission. Apparently she lives in a motel for the past 3 weeks, but no able to take care of herself or pay the bills. Her sister call the police to to a welfare check, she was found confused, covered in feces and urine. She was transported to the ED. On her initial physical examination she was able to speak but noted to be somnolent. She was placed on non invasive mechanical ventilation. Her blood pressure was 148/82, RR 22, HR 83 and 02 saturation 95%, patient somnolent but arousable to painful and verbal stimuli, lungs with decreased breath sounds bilaterally, heart with S1 and S2 present and rhythmic, abdomen not distended and positive lower extremity edema.    VBG 7,23/84/53/35/73% Na 146. K 4.2 Cl 102 bicarbonate 30 glucose 100 bun 27 cr 2,38  High sensitive troponin 199 and 209 BNP 778  Lactic acid 2,2  Wbc 9,7 hgb 12.8 plt 308  Urine analysis with SG 1,014, 100 protein, 21-50 wbc  Sars covid 19 negative  Toxicology screen negative    Chest radiograph with hypoinflation, positive cardiomegaly. No infiltrates.   EKG with 109 bpm, right axis, normal intervals, sinus rhythm, no significant ST segment or T wave changes, low voltage.    Patient failed non invasive mechanical ventilation and was intubated on 08/29. 08/31 patient extubated with good toleration 09/02 transferred to [facility].  09/03 patient tolerating well Bipap, at night and as needed during the day. She needs placement.  Per psychiatry patient does not have capacity to leave the hospital  ama.   Hypomagnesemia/hypokalemia: We will replace both of them and recheck tomorrow.  Assessment & Plan:   Principal Problem:   Acute hypercapnic respiratory failure (HCC) Active Problems:   Acute on chronic heart failure with preserved ejection fraction (HCC)   Type 2 diabetes mellitus without complication (HCC)   Urinary tract infection without hematuria   Pulmonary embolism (HCC)   Class 3 obesity (HCC)  Acute hypoxic and hypercapnic respiratory failure (HCC) Obesity hypoventilation syndrome.  Continue BiPAP at night. Plan to continue with non invasive bilevel mechanical ventilation.  Will need home ventilator at the time of her discharge.  Continue with PT/OT.  Continue 3 L oxygen via nasal.  During the daytime Patient will need placement.    Acute on chronic heart failure with preserved ejection fraction (HCC) Echocardiogram with LV systolic function is preserved 60 to 65%, moderate LVH. Preserved RV systolic function. No significant valvular disease.  We will transition from IV Lasix to oral Lasix 40 mg p.o. twice daily, continue coreg, jardiance.   Type 2 diabetes mellitus without complication (HCC): Slightly hyperglycemic.  Continue SSI.   Urinary tract infection without hematuria Patient has completed antibiotic therapy for urinary tract infection.    Pulmonary embolism (HCC) History of pulmonary embolism, continue anticoagulation with rivaroxaban.    Class 3 obesity (HCC) Calculated BMI is 84,6 weight loss counseling provided.  Capacity: She was assessed by psychiatry and per them, she does not have the capacity to leave AGAINST MEDICAL ADVICE or make any medical decisions for self.  Family was  concerned that she might try to leave AGAINST MEDICAL ADVICE again as she did from skilled nursing facility recently.  She in fact tried to do so as well.  Patient is however not under IVC.  However, if she were to try to be aggressive and tried to leave, IVC should be activated  immediately.   DVT prophylaxis: rivaroxaban (XARELTO) tablet 10 mg Start: 10/23/21 1300 SCDs Start: 10/19/21 2107   Code Status: Full Code  Family Communication:  None present at bedside.    Status is: Inpatient Remains inpatient appropriate because: Medically stable, waiting for placement.   Estimated body mass index is 82.06 kg/m as calculated from the following:   Height as of this encounter: 5\' 1"  (1.549 m).   Weight as of this encounter: 197 kg.    Nutritional Assessment: Body mass index is 82.06 kg/m. Seen by dietician.  I agree with the assessment and plan as outlined below: Nutrition Status: Nutrition Problem: Inadequate oral intake Etiology: inability to eat Signs/Symptoms: NPO status Interventions: Premier Protein, MVI, Prostat  . Skin Assessment: I have examined the patient's skin and I agree with the wound assessment as performed by the wound care RN as outlined below:    Consultants:  Psychiatry  Procedures:  None  Antimicrobials:  Anti-infectives (From admission, onward)    Start     Dose/Rate Route Frequency Ordered Stop   10/22/21 2100  cefTRIAXone (ROCEPHIN) 1 g in sodium chloride 0.9 % 100 mL IVPB        1 g 200 mL/hr over 30 Minutes Intravenous Every 24 hours 10/22/21 0748 10/22/21 2203   10/20/21 2100  cefTRIAXone (ROCEPHIN) 1 g in sodium chloride 0.9 % 100 mL IVPB  Status:  Discontinued        1 g 200 mL/hr over 30 Minutes Intravenous Every 24 hours 10/19/21 2118 10/22/21 0748   10/19/21 2100  cefTRIAXone (ROCEPHIN) 2 g in sodium chloride 0.9 % 100 mL IVPB        2 g 200 mL/hr over 30 Minutes Intravenous  Once 10/19/21 2046 10/19/21 2238         Subjective:  Seen and examined.  She has no complaints.  Objective: Vitals:   10/28/21 2151 10/29/21 0316 10/29/21 0432 10/29/21 0800  BP: (!) 145/62  (!) 133/51 (!) 110/50  Pulse: 89 83 75 64  Resp: 18 (!) 22  19  Temp: 97.6 F (36.4 C)  98.7 F (37.1 C) 98.9 F (37.2 C)  TempSrc:  Oral  Oral Axillary  SpO2: 98%  98% 95%  Weight:      Height:        Intake/Output Summary (Last 24 hours) at 10/29/2021 0925 Last data filed at 10/29/2021 0606 Gross per 24 hour  Intake 360 ml  Output 1300 ml  Net -940 ml    Filed Weights   10/21/21 0339 10/22/21 0452 10/27/21 0500  Weight: (!) 204.1 kg (!) 203.2 kg (!) 197 kg    Examination:  General exam: Appears calm and comfortable, morbidly obese Respiratory system: Clear to auscultation. Respiratory effort normal. Cardiovascular system: S1 & S2 heard, RRR. No JVD, murmurs, rubs, gallops or clicks.  Massive lymphedema bilateral lower extremity Gastrointestinal system: Abdomen is nondistended, soft and nontender. No organomegaly or masses felt. Normal bowel sounds heard. Central nervous system: Alert and oriented. No focal neurological deficits. Extremities: Symmetric 5 x 5 power. Skin: No rashes, lesions or ulcers.    Data Reviewed: I have personally reviewed following labs and imaging studies  CBC: Recent Labs  Lab 10/23/21 0241 10/23/21 0409  WBC 5.4 5.3  HGB 11.4* 11.5*  HCT 42.4 41.8  MCV 83.1 82.3  PLT 151 160    Basic Metabolic Panel: Recent Labs  Lab 10/22/21 1659 10/23/21 0409 10/24/21 0502 10/25/21 0518 10/26/21 0416  NA  --  147* 145 143 143  K  --  3.6 4.1 3.5 3.3*  CL  --  106 103 101 101  CO2  --  34* 35* 35* 37*  GLUCOSE  --  106* 91 106* 93  BUN  --  38* 29* 23* 16  CREATININE  --  1.08* 0.89 0.83 0.79  CALCIUM  --  8.0* 8.1* 8.3* 8.4*  MG 2.4  --   --   --  1.9  PHOS 2.8  --   --   --   --     GFR: Estimated Creatinine Clearance: 138 mL/min (by C-G formula based on SCr of 0.79 mg/dL). Liver Function Tests: Recent Labs  Lab 10/23/21 0409  AST 92*  ALT 103*  ALKPHOS 55  BILITOT 1.7*  PROT 6.6  ALBUMIN 3.2*    No results for input(s): "LIPASE", "AMYLASE" in the last 168 hours. Recent Labs  Lab 10/23/21 0241  AMMONIA 31    Coagulation Profile: Recent Labs  Lab  10/23/21 0241  INR 1.3*    Cardiac Enzymes: No results for input(s): "CKTOTAL", "CKMB", "CKMBINDEX", "TROPONINI" in the last 168 hours. BNP (last 3 results) No results for input(s): "PROBNP" in the last 8760 hours. HbA1C: No results for input(s): "HGBA1C" in the last 72 hours. CBG: Recent Labs  Lab 10/28/21 1620 10/28/21 2025 10/29/21 0000 10/29/21 0423 10/29/21 0728  GLUCAP 89 98 103* 79 84    Lipid Profile: No results for input(s): "CHOL", "HDL", "LDLCALC", "TRIG", "CHOLHDL", "LDLDIRECT" in the last 72 hours. Thyroid Function Tests: No results for input(s): "TSH", "T4TOTAL", "FREET4", "T3FREE", "THYROIDAB" in the last 72 hours.  Anemia Panel: No results for input(s): "VITAMINB12", "FOLATE", "FERRITIN", "TIBC", "IRON", "RETICCTPCT" in the last 72 hours.  Sepsis Labs: No results for input(s): "PROCALCITON", "LATICACIDVEN" in the last 168 hours.   Recent Results (from the past 240 hour(s))  Respiratory (~20 pathogens) panel by PCR     Status: None   Collection Time: 10/19/21  9:21 PM   Specimen: Nasopharyngeal Swab; Respiratory  Result Value Ref Range Status   Adenovirus NOT DETECTED NOT DETECTED Final   Coronavirus 229E NOT DETECTED NOT DETECTED Final    Comment: (NOTE) The Coronavirus on the Respiratory Panel, DOES NOT test for the novel  Coronavirus (2019 nCoV)    Coronavirus HKU1 NOT DETECTED NOT DETECTED Final   Coronavirus NL63 NOT DETECTED NOT DETECTED Final   Coronavirus OC43 NOT DETECTED NOT DETECTED Final   Metapneumovirus NOT DETECTED NOT DETECTED Final   Rhinovirus / Enterovirus NOT DETECTED NOT DETECTED Final   Influenza A NOT DETECTED NOT DETECTED Final   Influenza B NOT DETECTED NOT DETECTED Final   Parainfluenza Virus 1 NOT DETECTED NOT DETECTED Final   Parainfluenza Virus 2 NOT DETECTED NOT DETECTED Final   Parainfluenza Virus 3 NOT DETECTED NOT DETECTED Final   Parainfluenza Virus 4 NOT DETECTED NOT DETECTED Final   Respiratory Syncytial Virus  NOT DETECTED NOT DETECTED Final   Bordetella pertussis NOT DETECTED NOT DETECTED Final   Bordetella Parapertussis NOT DETECTED NOT DETECTED Final   Chlamydophila pneumoniae NOT DETECTED NOT DETECTED Final   Mycoplasma pneumoniae NOT DETECTED NOT DETECTED Final  Comment: Performed at [facility], [address] [place], [place] [address]  Urine Culture     Status: Abnormal   Collection Time: 10/19/21  9:39 PM   Specimen: Urine, Catheterized  Result Value Ref Range Status   Specimen Description   Final    URINE, CATHETERIZED Performed at [facility], [address] [place], [place] [address]    Special Requests   Final    NONE Performed at [facility], [address] [place], [place] [address]    Culture (A)  Final    70,000 COLONIES/mL LACTOBACILLUS SPECIES Standardized susceptibility testing for this organism is not available. Performed at [facility], [address] [place], [place] [address]    Report Status 10/21/2021 FINAL  Final  SARS Coronavirus 2 by RT PCR (hospital order, performed in [facility] hospital lab) *cepheid single result test*     Status: None   Collection Time: 10/19/21 11:12 PM  Result Value Ref Range Status   SARS Coronavirus 2 by RT PCR NEGATIVE NEGATIVE Final    Comment: (NOTE) SARS-CoV-2 target nucleic acids are NOT DETECTED.  The SARS-CoV-2 RNA is generally detectable in upper and lower respiratory specimens during the acute phase of infection. The lowest concentration of SARS-CoV-2 viral copies this assay can detect is 250 copies / mL. A negative result does not preclude SARS-CoV-2 infection and should not be used as the sole basis for treatment or other patient management decisions.  A negative result may occur with improper specimen collection / handling, submission of specimen other than nasopharyngeal swab, presence of viral mutation(s) within the areas targeted by this assay, and  inadequate number of viral copies (<250 copies / mL). A negative result must be combined with clinical observations, patient history, and epidemiological information.  Fact Sheet for Patients:   RoadLapTop.co.zahttps://www.fda.gov/media/158405/download  Fact Sheet for Healthcare Providers: http://kim-miller.com/https://www.fda.gov/media/158404/download  This test is not yet approved or  cleared by the [place] FDA and has been authorized for detection and/or diagnosis of SARS-CoV-2 by FDA under an Emergency Use Authorization (EUA).  This EUA will remain in effect (meaning this test can be used) for the duration of the COVID-19 declaration under Section 564(b)(1) of the Act, 21 U.S.C. section 360bbb-3(b)(1), unless the authorization is terminated or revoked sooner.  Performed at [facility], [address] [place], [place] [address]   MRSA Next Gen by PCR, Nasal     Status: None   Collection Time: 10/20/21  1:33 AM   Specimen: Nasal Mucosa; Nasal Swab  Result Value Ref Range Status   MRSA by PCR Next Gen NOT DETECTED NOT DETECTED Final    Comment: (NOTE) The GeneXpert MRSA Assay (FDA approved for NASAL specimens only), is one component of a comprehensive MRSA colonization surveillance program. It is not intended to diagnose MRSA infection nor to guide or monitor treatment for MRSA infections. Test performance is not FDA approved in patients less than 53 years old. Performed at [facility], [address] [place], [place] [address]      Radiology Studies: No results found.  Scheduled Meds:  (feeding supplement) PROSource Plus  30 mL Oral BID BM   carvedilol  3.125 mg Oral BID WC   Chlorhexidine Gluconate Cloth  6 each Topical Q0600   empagliflozin  10 mg Oral Daily   furosemide  40 mg Intravenous Daily   insulin aspart  0-15 Units Subcutaneous Q4H   multivitamin with minerals  1 tablet Oral Daily   mouth rinse  15 mL Mouth Rinse 4 times per day   Ensure Max  Protein  11 oz Oral BID   rivaroxaban  10 mg Oral Daily   Continuous Infusions:   LOS: 10 days   Hughie Closs, MD [facility]  10/29/2021, 9:25 AM   *Please note that this is a verbal dictation therefore any spelling or grammatical errors are due to the "Dragon Medical One" system interpretation.  Please page via Amion and do not message via secure chat for urgent patient care matters. Secure chat can be used for non urgent patient care matters.  How to contact the [facility] Attending or Consulting provider 7A - 7P or covering provider during after hours 7P -7A, for this patient?  Check the care team in [facility] and look for a) attending/consulting TRH provider listed and b) the [facility] team listed. Page or secure chat 7A-7P. Log into [email or web address] and use [place]'s universal password to access. If you do not have the password, please contact the hospital operator. Locate the [facility] provider you are looking for under [facility] and page to a number that you can be directly reached. If you still have difficulty reaching the provider, please page the [facility] (Director on Call) for the Hospitalists listed on amion for assistance.

## 2021-10-29 NOTE — TOC Progression Note (Signed)
Transition of Care [facility]) - Progression Note    Patient Details  Name: Felicia Warren MRN: [id number] Date of Birth: January 23, 1971  [facility] CM/SW Contact  Viaan Knippenberg, Olegario Messier, RN Phone Number: 10/29/2021, 3:55 PM  Clinical Narrative: Re faxed signed fl2 to [employer] fax#[phone]-spoke to Rosa-unit secy. Continue to monitor.      Expected Discharge Plan: Skilled Nursing Facility Barriers to Discharge: Continued Medical Work up  Expected Discharge Plan and Services Expected Discharge Plan: Skilled Nursing Facility       Living arrangements for the past 2 months: Homeless                                       Social Determinants of Health (SDOH) Interventions    Readmission Risk Interventions     No data to display

## 2021-10-29 NOTE — Progress Notes (Signed)
Blood sugar of 79. Juice given to patient

## 2021-10-29 NOTE — Progress Notes (Signed)
Pt placed on bipap for the night. °

## 2021-10-30 DIAGNOSIS — J9602 Acute respiratory failure with hypercapnia: Secondary | ICD-10-CM | POA: Diagnosis not present

## 2021-10-30 LAB — BASIC METABOLIC PANEL
Anion gap: 7 (ref 5–15)
BUN: 9 mg/dL (ref 6–20)
CO2: 34 mmol/L — ABNORMAL HIGH (ref 22–32)
Calcium: 7.8 mg/dL — ABNORMAL LOW (ref 8.9–10.3)
Chloride: 97 mmol/L — ABNORMAL LOW (ref 98–111)
Creatinine, Ser: 0.82 mg/dL (ref 0.44–1.00)
GFR, Estimated: >60 mL/min (ref >60)
Glucose, Bld: 104 mg/dL — ABNORMAL HIGH (ref 70–99)
Potassium: 3.4 mmol/L — ABNORMAL LOW (ref 3.5–5.1)
Sodium: 138 mmol/L (ref 135–145)

## 2021-10-30 LAB — MAGNESIUM: Magnesium: 1.7 mg/dL (ref 1.7–2.4)

## 2021-10-30 LAB — GLUCOSE, CAPILLARY
Glucose-Capillary: 101 mg/dL — ABNORMAL HIGH (ref 70–99)
Glucose-Capillary: 86 mg/dL (ref 70–99)
Glucose-Capillary: 89 mg/dL (ref 70–99)
Glucose-Capillary: 92 mg/dL (ref 70–99)
Glucose-Capillary: 94 mg/dL (ref 70–99)
Glucose-Capillary: 96 mg/dL (ref 70–99)
Glucose-Capillary: 99 mg/dL (ref 70–99)

## 2021-10-30 NOTE — Progress Notes (Signed)
Pt placed on bipap for the night. °

## 2021-10-30 NOTE — Progress Notes (Signed)
PROGRESS NOTE    Felicia Warren  PTW:[id number] DOB: 1970-07-24 DOA: 10/19/2021 PCP: Storm Frisk, MD   Brief Narrative:  Felicia Warren was admitted to the hospital with the working diagnosis of acute hypercapnic and hypoxemic respiratory failure.   51 yo female with the past medical history of obesity hypoventilation syndrome, T2DM and heart failure who presented with respiratory failure. Patient was noted to be confused 48 hrs before her admission. Apparently she lives in a motel for the past 3 weeks, but no able to take care of herself or pay the bills. Her sister call the police to to a welfare check, she was found confused, covered in feces and urine. She was transported to the ED. On her initial physical examination she was able to speak but noted to be somnolent. She was placed on non invasive mechanical ventilation. Her blood pressure was 148/82, RR 22, HR 83 and 02 saturation 95%, patient somnolent but arousable to painful and verbal stimuli, lungs with decreased breath sounds bilaterally, heart with S1 and S2 present and rhythmic, abdomen not distended and positive lower extremity edema.    VBG 7,23/84/53/35/73% Na 146. K 4.2 Cl 102 bicarbonate 30 glucose 100 bun 27 cr 2,38  High sensitive troponin 199 and 209 BNP 778  Lactic acid 2,2  Wbc 9,7 hgb 12.8 plt 308  Urine analysis with SG 1,014, 100 protein, 21-50 wbc  Sars covid 19 negative  Toxicology screen negative    Chest radiograph with hypoinflation, positive cardiomegaly. No infiltrates.   EKG with 109 bpm, right axis, normal intervals, sinus rhythm, no significant ST segment or T wave changes, low voltage.    Patient failed non invasive mechanical ventilation and was intubated on 08/29. 08/31 patient extubated with good toleration 09/02 transferred to [facility].  09/03 patient tolerating well Bipap, at night and as needed during the day. She needs placement.  Per psychiatry patient does not have capacity to leave the hospital  ama.   Hypomagnesemia/hypokalemia: We will replace both of them and recheck tomorrow.  Assessment & Plan:   Principal Problem:   Acute hypercapnic respiratory failure (HCC) Active Problems:   Acute on chronic heart failure with preserved ejection fraction (HCC)   Type 2 diabetes mellitus without complication (HCC)   Urinary tract infection without hematuria   Pulmonary embolism (HCC)   Class 3 obesity (HCC)  Acute hypoxic and hypercapnic respiratory failure (HCC) Obesity hypoventilation syndrome.  Continue BiPAP at night. Plan to continue with non invasive bilevel mechanical ventilation.  Will need home ventilator at the time of her discharge.  Continue with PT/OT.  Continue 3 L oxygen via nasal.  During the daytime. Patient will need placement.    Acute on chronic heart failure with preserved ejection fraction (HCC) Echocardiogram with LV systolic function is preserved 60 to 65%, moderate LVH. Preserved RV systolic function. No significant valvular disease.  I transitioned her from IV Lasix to oral Lasix 40 mg p.o. twice daily, continue coreg, jardiance.   Type 2 diabetes mellitus without complication (HCC): Slightly hypoglycemic at times.  Continue SSI.   Urinary tract infection without hematuria Patient has completed antibiotic therapy for urinary tract infection.    Pulmonary embolism (HCC) History of pulmonary embolism, continue anticoagulation with rivaroxaban.    Class 3 obesity (HCC) Calculated BMI is 84,6 weight loss counseling provided.  Capacity: She was assessed by psychiatry and per them, she does not have the capacity to leave AGAINST MEDICAL ADVICE or make any medical decisions for self.  Family was concerned that she might try to leave Felicia Warren again as she did from skilled nursing facility recently.  She in fact tried to do so as well.  Patient is however not under IVC.  However, if she were to try to be aggressive and tried to leave, IVC should be  activated immediately.   DVT prophylaxis: rivaroxaban (XARELTO) tablet 10 mg Start: 10/23/21 1300 SCDs Start: 10/19/21 2107   Code Status: Full Code  Family Communication:  None present at bedside.    Status is: Inpatient Remains inpatient appropriate because: Medically stable, waiting for placement.   Estimated body mass index is 82.06 kg/m as calculated from the following:   Height as of this encounter: 5\' 1"  (1.549 m).   Weight as of this encounter: 197 kg.    Nutritional Assessment: Body mass index is 82.06 kg/m.Marland Kitchen Seen by dietician.  I agree with the assessment and plan as outlined below: Nutrition Status: Nutrition Problem: Inadequate oral intake Etiology: inability to eat Signs/Symptoms: NPO status Interventions: Premier Protein, MVI, Prostat  . Skin Assessment: I have examined the patient's skin and I agree with the wound assessment as performed by the wound care RN as outlined below:    Consultants:  Psychiatry  Procedures:  None  Antimicrobials:  Anti-infectives (From admission, onward)    Start     Dose/Rate Route Frequency Ordered Stop   10/22/21 2100  cefTRIAXone (ROCEPHIN) 1 g in sodium chloride 0.9 % 100 mL IVPB        1 g 200 mL/hr over 30 Minutes Intravenous Every 24 hours 10/22/21 0748 10/22/21 2203   10/20/21 2100  cefTRIAXone (ROCEPHIN) 1 g in sodium chloride 0.9 % 100 mL IVPB  Status:  Discontinued        1 g 200 mL/hr over 30 Minutes Intravenous Every 24 hours 10/19/21 2118 10/22/21 0748   10/19/21 2100  cefTRIAXone (ROCEPHIN) 2 g in sodium chloride 0.9 % 100 mL IVPB        2 g 200 mL/hr over 30 Minutes Intravenous  Once 10/19/21 2046 10/19/21 2238         Subjective:  Patient seen and examined.  She has no complaints.  Objective: Vitals:   10/29/21 0800 10/29/21 1239 10/29/21 2012 10/30/21 0412  BP: (!) 110/50 (!) 113/55 (!) 121/56 (!) 120/51  Pulse: 64 82 89 87  Resp: 19 19 20 18   Temp: 98.9 F (37.2 C) 98 F (36.7 C) 98.6 F  (37 C) 99.6 F (37.6 C)  TempSrc: Axillary Oral Oral   SpO2: 95% 100% 100% 100%  Weight:      Height:        Intake/Output Summary (Last 24 hours) at 10/30/2021 1038 Last data filed at 10/29/2021 1800 Gross per 24 hour  Intake 414.69 ml  Output 1130 ml  Net -715.31 ml    Filed Weights   10/21/21 0339 10/22/21 0452 10/27/21 0500  Weight: (!) 204.1 kg (!) 203.2 kg (!) 197 kg    Examination:  General exam: Appears calm and comfortable, morbidly obese Respiratory system: Clear to auscultation. Respiratory effort normal. Cardiovascular system: S1 & S2 heard, RRR. No JVD, murmurs, rubs, gallops or clicks.  Massive lymphedema bilateral lower extremity Gastrointestinal system: Abdomen is nondistended, soft and nontender. No organomegaly or masses felt. Normal bowel sounds heard. Central nervous system: Alert and oriented. No focal neurological deficits. Extremities: Symmetric 5 x 5 power. Skin: No rashes, lesions or ulcers.  Psychiatry: Judgement and insight appear normal. Mood &  affect appropriate.    Data Reviewed: I have personally reviewed following labs and imaging studies  CBC: Recent Labs  Lab 10/29/21 0937  WBC 6.6  NEUTROABS 4.9  HGB 11.5*  HCT 41.6  MCV 80.9  PLT 163    Basic Metabolic Panel: Recent Labs  Lab 10/24/21 0502 10/25/21 0518 10/26/21 0416 10/29/21 0937  NA 145 143 143 143  K 4.1 3.5 3.3* 2.9*  CL 103 101 101 99  CO2 35* 35* 37* 37*  GLUCOSE 91 106* 93 102*  BUN 29* 23* 16 11  CREATININE 0.89 0.83 0.79 0.79  CALCIUM 8.1* 8.3* 8.4* 8.0*  MG  --   --  1.9 1.6*    GFR: Estimated Creatinine Clearance: 138 mL/min (by C-G formula based on SCr of 0.79 mg/dL). Liver Function Tests: No results for input(s): "AST", "ALT", "ALKPHOS", "BILITOT", "PROT", "ALBUMIN" in the last 168 hours.  No results for input(s): "LIPASE", "AMYLASE" in the last 168 hours. No results for input(s): "AMMONIA" in the last 168 hours.  Coagulation Profile: No results  for input(s): "INR", "PROTIME" in the last 168 hours.  Cardiac Enzymes: No results for input(s): "CKTOTAL", "CKMB", "CKMBINDEX", "TROPONINI" in the last 168 hours. BNP (last 3 results) No results for input(s): "PROBNP" in the last 8760 hours. HbA1C: No results for input(s): "HGBA1C" in the last 72 hours. CBG: Recent Labs  Lab 10/29/21 1606 10/29/21 2008 10/30/21 0107 10/30/21 0406 10/30/21 0734  GLUCAP 106* 94 94 89 86    Lipid Profile: No results for input(s): "CHOL", "HDL", "LDLCALC", "TRIG", "CHOLHDL", "LDLDIRECT" in the last 72 hours. Thyroid Function Tests: No results for input(s): "TSH", "T4TOTAL", "FREET4", "T3FREE", "THYROIDAB" in the last 72 hours.  Anemia Panel: No results for input(s): "VITAMINB12", "FOLATE", "FERRITIN", "TIBC", "IRON", "RETICCTPCT" in the last 72 hours.  Sepsis Labs: No results for input(s): "PROCALCITON", "LATICACIDVEN" in the last 168 hours.   No results found for this or any previous visit (from the past 240 hour(s)).    Radiology Studies: No results found.  Scheduled Meds:  (feeding supplement) PROSource Plus  30 mL Oral BID BM   carvedilol  3.125 mg Oral BID WC   Chlorhexidine Gluconate Cloth  6 each Topical Q0600   empagliflozin  10 mg Oral Daily   furosemide  40 mg Oral BID   insulin aspart  0-15 Units Subcutaneous Q4H   multivitamin with minerals  1 tablet Oral Daily   mouth rinse  15 mL Mouth Rinse 4 times per day   Ensure Max Protein  11 oz Oral BID   rivaroxaban  10 mg Oral Daily   Continuous Infusions:  sodium chloride 10 mL/hr at 10/29/21 1307     LOS: 11 days   Hughie Closs, MD [facility]  10/30/2021, 10:38 AM   *Please note that this is a verbal dictation therefore any spelling or grammatical errors are due to the "Dragon Medical One" system interpretation.  Please page via Amion and do not message via secure chat for urgent patient care matters. Secure chat can be used for non urgent patient care  matters.  How to contact the [facility] Attending or Consulting provider 7A - 7P or covering provider during after hours 7P -7A, for this patient?  Check the care team in [facility] and look for a) attending/consulting TRH provider listed and b) the [facility] team listed. Page or secure chat 7A-7P. Log into [email or web address] and use [place]'s universal password to access. If you do not have the password, please contact  the hospital operator. Locate the [facility] provider you are looking for under [facility] and page to a number that you can be directly reached. If you still have difficulty reaching the provider, please page the [facility] (Director on Call) for the Hospitalists listed on amion for assistance.

## 2021-10-30 NOTE — TOC Progression Note (Signed)
Transition of Care [facility]) - Progression Note    Patient Details  Name: Felicia Warren MRN: [id number] Date of Birth: June 15, 1970  Transition of Care [facility]: Lambert Rhonda W) CM/SW Contact  Madalee Altmann, Olegario Messier, RN Phone Number: 10/30/2021, 12:17 PM  Clinical Narrative: spoke to patient about current status w/bed offers-no bed offers/patient voiced understanding. Left vm w/[employer] rep Carol(Adm coordinator) awaiting call back about if able to accept.      Expected Discharge Plan: Skilled Nursing Facility Barriers to Discharge: Continued Medical Work up  Expected Discharge Plan and Services Expected Discharge Plan: Skilled Nursing Facility       Living arrangements for the past 2 months: Homeless                                       Social Determinants of Health (SDOH) Interventions Housing Interventions: Inpatient TOC  Readmission Risk Interventions     No data to display

## 2021-10-30 NOTE — Progress Notes (Signed)
Mobility Specialist - Progress Note    10/30/21 1513  Mobility  HOB Elevated/Bed Position Self regulated  Activity Ambulated with assistance to bathroom;Ambulated independently in room  Range of Motion/Exercises Active  Level of Assistance Modified independent, requires aide device or extra time  Assistive Device Front wheel walker  Distance Ambulated (ft) 10 ft  Activity Response Tolerated well  Transport method Ambulatory  $Mobility charge 1 Mobility   Pt received in bed and agreeable to mobility in the room. Pt walked around the bed 2x. Assisted pt w/ ADLs in restroom.  Pt mentioned being interested in doing in bed/ EOB exercises. Pt to bed after session with all needs met.    [facility]

## 2021-10-31 DIAGNOSIS — I2782 Chronic pulmonary embolism: Secondary | ICD-10-CM | POA: Diagnosis not present

## 2021-10-31 DIAGNOSIS — I5033 Acute on chronic diastolic (congestive) heart failure: Secondary | ICD-10-CM | POA: Diagnosis not present

## 2021-10-31 DIAGNOSIS — J9602 Acute respiratory failure with hypercapnia: Secondary | ICD-10-CM | POA: Diagnosis not present

## 2021-10-31 LAB — GLUCOSE, CAPILLARY
Glucose-Capillary: 75 mg/dL (ref 70–99)
Glucose-Capillary: 85 mg/dL (ref 70–99)
Glucose-Capillary: 84 mg/dL (ref 70–99)
Glucose-Capillary: 92 mg/dL (ref 70–99)
Glucose-Capillary: 86 mg/dL (ref 70–99)

## 2021-10-31 NOTE — Progress Notes (Signed)
PROGRESS NOTE    Felicia Warren  GTX:[id number] DOB: 07-29-1970 DOA: 10/19/2021 PCP: Storm Frisk, MD   Brief Narrative:  Felicia Warren was admitted to the hospital with the working diagnosis of acute hypercapnic and hypoxemic respiratory failure.   51 yo female with the past medical history of obesity hypoventilation syndrome, T2DM and heart failure who presented with respiratory failure. Patient was noted to be confused 48 hrs before her admission. Apparently she lives in a motel for the past 3 weeks, but no able to take care of herself or pay the bills. Her sister call the police to to a welfare check, she was found confused, covered in feces and urine. She was transported to the ED. On her initial physical examination she was able to speak but noted to be somnolent. She was placed on non invasive mechanical ventilation. Her blood pressure was 148/82, RR 22, HR 83 and 02 saturation 95%, patient somnolent but arousable to painful and verbal stimuli, lungs with decreased breath sounds bilaterally, heart with S1 and S2 present and rhythmic, abdomen not distended and positive lower extremity edema.    VBG 7,23/84/53/35/73% Na 146. K 4.2 Cl 102 bicarbonate 30 glucose 100 bun 27 cr 2,38  High sensitive troponin 199 and 209 BNP 778  Lactic acid 2,2  Wbc 9,7 hgb 12.8 plt 308  Urine analysis with SG 1,014, 100 protein, 21-50 wbc  Sars covid 19 negative  Toxicology screen negative    Chest radiograph with hypoinflation, positive cardiomegaly. No infiltrates.   EKG with 109 bpm, right axis, normal intervals, sinus rhythm, no significant ST segment or T wave changes, low voltage.    Patient failed non invasive mechanical ventilation and was intubated on 08/29. 08/31 patient extubated with good toleration 09/02 transferred to [facility].  09/03 patient tolerating well Bipap, at night and as needed during the day. She needs placement.  Per psychiatry patient does not have capacity to leave the hospital  ama.   Assessment & Plan:   Principal Problem:   Acute hypercapnic respiratory failure (HCC) Active Problems:   Acute on chronic heart failure with preserved ejection fraction (HCC)   Type 2 diabetes mellitus without complication (HCC)   Urinary tract infection without hematuria   Pulmonary embolism (HCC)   Class 3 obesity (HCC)  Acute hypoxic and hypercapnic respiratory failure (HCC) Obesity hypoventilation syndrome.   -Continue BiPAP at night. -Plan to continue with non invasive bilevel mechanical ventilation at time of discharge.  -Continue 3 L nasal cannula supplementation during the day. -Found significantly weak/deconditioned and in need for nursing home placement to pursued rehabilitation prior to discharge home.   Acute on chronic heart failure with preserved ejection fraction (HCC) -Echocardiogram with LV systolic function is preserved 60 to 65%, moderate LVH. Preserved RV systolic function. No significant valvular disease.  -Appears to be currently compensated/stable after receiving IV diuresis. -Continue current dose of oral diuretics, continue the use of Coreg and Jardiance. -Heart healthy diet/low-sodium and adequate hydration discussed with patient. -Continue daily weights and strict I's and O's.  Hypomagnesemia/hypokalemia:  -`Repleted w -Will continue to follow electrolytes trend to further replete as needed.  -Patient advised to maintain adequate hydration.   Type 2 diabetes mellitus without complication (HCC): -Slightly hypoglycemic at times when skipping meals.  -Continue SSI.   Urinary tract infection without hematuria -Patient has completed antibiotic therapy for urinary tract infection.  -Reports no dysuria or frequency currently.   Pulmonary embolism (HCC) -Continue treatment with Xarelto.   Class 3  obesity (HCC) -Body mass index is 81.23 kg/m. -Low calorie diet, portion control and increase physical activity discussed with patient.  Capacity: She  was assessed by psychiatry and per them, she has not capacity to leave AGAINST MEDICAL ADVICE or make any medical decisions for herself.  Family was concerned that she might try to leave AGAINST MEDICAL ADVICE again as she did from skilled nursing facility recently.  She in fact tried to do so as well.  Patient is however not under IVC.  However, if she were to try to be aggressive and tried to leave, IVC should be activated immediately.   DVT prophylaxis: rivaroxaban (XARELTO) tablet 10 mg Start: 10/23/21 1300 SCDs Start: 10/19/21 2107   Code Status: Full Code  Family Communication:  None present at bedside.    Status is: Inpatient Remains inpatient appropriate because: Medically stable, waiting for placement.   Estimated body mass index is 81.23 kg/m as calculated from the following:   Height as of this encounter: 5\' 1"  (1.549 m).   Weight as of this encounter: 195 kg.    Nutritional Assessment: Body mass index is 81.23 kg/m. Seen by dietician.  I agree with the assessment and plan as outlined below:  Nutrition Status: Nutrition Problem: Inadequate oral intake Etiology: inability to eat Signs/Symptoms: NPO status Interventions: Premier Protein, MVI, Prostat  . Skin Assessment: I have examined the patient's skin and I agree with the wound assessment as performed by the wound care RN as outlined below:    Consultants:  Psychiatry  Procedures:  None  Antimicrobials:  Anti-infectives (From admission, onward)    Start     Dose/Rate Route Frequency Ordered Stop   10/22/21 2100  cefTRIAXone (ROCEPHIN) 1 g in sodium chloride 0.9 % 100 mL IVPB        1 g 200 mL/hr over 30 Minutes Intravenous Every 24 hours 10/22/21 0748 10/22/21 2203   10/20/21 2100  cefTRIAXone (ROCEPHIN) 1 g in sodium chloride 0.9 % 100 mL IVPB  Status:  Discontinued        1 g 200 mL/hr over 30 Minutes Intravenous Every 24 hours 10/19/21 2118 10/22/21 0748   10/19/21 2100  cefTRIAXone (ROCEPHIN) 2 g in  sodium chloride 0.9 % 100 mL IVPB        2 g 200 mL/hr over 30 Minutes Intravenous  Once 10/19/21 2046 10/19/21 2238         Subjective: No overnight events; no chest pain, no nausea, no vomiting, no shortness of breath.  Good saturation on 3 L nasal cannula supplementation.  Patient is afebrile.  Objective: Vitals:   10/30/21 1337 10/30/21 2016 10/31/21 0410 10/31/21 0415  BP: (!) 118/51 (!) 110/54 (!) 128/48   Pulse: 74 86 74   Resp: 18 19 18    Temp: (!) 97.4 F (36.3 C) 98.2 F (36.8 C) 98 F (36.7 C)   TempSrc: Oral Oral Axillary   SpO2: 100% 99% 95%   Weight:    (!) 195 kg  Height:        Intake/Output Summary (Last 24 hours) at 10/31/2021 1039 Last data filed at 10/31/2021 0410 Gross per 24 hour  Intake 360 ml  Output 950 ml  Net -590 ml   Filed Weights   10/22/21 0452 10/27/21 0500 10/31/21 0415  Weight: (!) 203.2 kg (!) 197 kg (!) 195 kg    Examination: General exam: Alert, awake, oriented x 3; in no acute distress and expressing feeling weak and tired.  Patient following commands  appropriately and was very pleasant.  No chest pain, no nausea, no vomiting, no abdominal pain.  Patient was afebrile. Respiratory system: Positive scattered rhonchi appreciated ; No using accessory muscles.  Good saturation on 3 L nasal cannula supplementation. Cardiovascular system:RRR. No rubs or gallops; unable to assess JVD with body habitus. Gastrointestinal system: Abdomen is obese, nondistended, soft and nontender. No organomegaly or masses felt. Normal bowel sounds heard. Central nervous system: Alert and oriented. No focal neurological deficits. Extremities: No cyanosis or clubbing; chronic swelling/lymphedema at baseline per patient reports; Seen on both legs bilaterally. Skin: No petechiae. Psychiatry: Judgement and insight appear normal. Mood & affect appropriate.    Data Reviewed: I have personally reviewed following labs and imaging studies  CBC: Recent Labs  Lab  10/29/21 0937  WBC 6.6  NEUTROABS 4.9  HGB 11.5*  HCT 41.6  MCV 80.9  PLT 163   Basic Metabolic Panel: Recent Labs  Lab 10/25/21 0518 10/26/21 0416 10/29/21 0937 10/30/21 1236  NA 143 143 143 138  K 3.5 3.3* 2.9* 3.4*  CL 101 101 99 97*  CO2 35* 37* 37* 34*  GLUCOSE 106* 93 102* 104*  BUN 23* 16 11 9   CREATININE 0.83 0.79 0.79 0.82  CALCIUM 8.3* 8.4* 8.0* 7.8*  MG  --  1.9 1.6* 1.7   GFR: Estimated Creatinine Clearance: 133.6 mL/min (by C-G formula based on SCr of 0.82 mg/dL).  CBG: Recent Labs  Lab 10/30/21 1614 10/30/21 2013 10/30/21 2347 10/31/21 0404 10/31/21 0736  GLUCAP 101* 92 96 75 85   Radiology Studies: No results found.  Scheduled Meds:  (feeding supplement) PROSource Plus  30 mL Oral BID BM   carvedilol  3.125 mg Oral BID WC   empagliflozin  10 mg Oral Daily   furosemide  40 mg Oral BID   insulin aspart  0-15 Units Subcutaneous Q4H   multivitamin with minerals  1 tablet Oral Daily   mouth rinse  15 mL Mouth Rinse 4 times per day   Ensure Max Protein  11 oz Oral BID   rivaroxaban  10 mg Oral Daily   Continuous Infusions:  sodium chloride 10 mL/hr at 10/29/21 1307     LOS: 12 days   12/29/21, MD [facility]  10/31/2021, 10:39 AM   *Please note that this is a verbal dictation therefore any spelling or grammatical errors are due to the "Dragon Medical One" system interpretation.  Please page via Amion and do not message via secure chat for urgent patient care matters. Secure chat can be used for non urgent patient care matters.  How to contact the [facility] Attending or Consulting provider 7A - 7P or covering provider during after hours 7P -7A, for this patient?  Check the care team in [facility] and look for a) attending/consulting TRH provider listed and b) the [facility] team listed. Page or secure chat 7A-7P. Log into [email or web address] and use [place]'s universal password to access. If you do not have the password, please contact the hospital  operator. Locate the [facility] provider you are looking for under [facility] and page to a number that you can be directly reached. If you still have difficulty reaching the provider, please page the [facility] (Director on Call) for the Hospitalists listed on amion for assistance.

## 2021-10-31 NOTE — Progress Notes (Signed)
Physical Therapy Treatment Patient Details Name: Keyonta Madrid MRN: [id number] DOB: Apr 04, 1970 Today's Date: 10/31/2021   History of Present Illness HPI: 51 yo female with pmh dm2, hfpEF, chronic osa non adherent with home niv. [employer], FTT - drowsy found to be hypercarbic with AKI, acute liver injury, and elevated bnp/trop with known HFpef, morbid obesity.   Intubated 8/28, self extubated 8/31. Pt's CXR negative for pna    PT Comments    Pt tolerated significant increase in activity level today. She ambulated 100' with bariatric RW, no loss of balance. SaO2 86% on room air, 96% on 3L O2 walking. Updated DC recommendations to reflect improved mobility status.     Recommendations for follow up therapy are one component of a multi-disciplinary discharge planning process, led by the attending physician.  Recommendations may be updated based on patient status, additional functional criteria and insurance authorization.  Follow Up Recommendations  Home health PT Can patient physically be transported by private vehicle: Yes   Assistance Recommended at Discharge Intermittent Supervision/Assistance  Patient can return home with the following A little help with walking and/or transfers;A little help with bathing/dressing/bathroom;Assistance with cooking/housework;Assist for transportation;Help with stairs or ramp for entrance   Equipment Recommendations  None recommended by PT    Recommendations for Other Services       Precautions / Restrictions Precautions Precautions: Fall Precaution Comments: pt denies falls in past 6 months Restrictions Weight Bearing Restrictions: No     Mobility  Bed Mobility   Bed Mobility: Supine to Sit     Supine to sit: Supervision, HOB elevated     General bed mobility comments: HOB up, used bedrail    Transfers Overall transfer level: Needs assistance Equipment used: Rolling walker (2 wheels) Transfers: Sit to/from Stand Sit to Stand:  Supervision           General transfer comment: able to rise from bed as well as toilet in bathroom, relies on BUEs on bariRW    Ambulation/Gait Ambulation/Gait assistance: Supervision Gait Distance (Feet): 100 Feet Assistive device: Rolling walker (2 wheels) Gait Pattern/deviations: Step-through pattern, Decreased stride length, Wide base of support Gait velocity: decr     General Gait Details: steady, no loss of balance, SaO2 86% on room air walking, 96% on 3L walking   Stairs             Wheelchair Mobility    Modified Rankin (Stroke Patients Only)       Balance Overall balance assessment: Needs assistance Sitting-balance support: No upper extremity supported, Feet supported Sitting balance-Leahy Scale: Good     Standing balance support: Bilateral upper extremity supported, Reliant on assistive device for balance Standing balance-Leahy Scale: Poor                              Cognition Arousal/Alertness: Awake/alert Behavior During Therapy: WFL for tasks assessed/performed Overall Cognitive Status: Within Functional Limits for tasks assessed                                 General Comments: AxO x 3 pleasant        Exercises      General Comments        Pertinent Vitals/Pain Pain Assessment Pain Score: 0-No pain    Home Living  Prior Function            PT Goals (current goals can now be found in the care plan section) Acute Rehab PT Goals Patient Stated Goal: Walk PT Goal Formulation: With patient Time For Goal Achievement: 11/06/21 Potential to Achieve Goals: Fair Progress towards PT goals: Goals met and updated - see care plan    Frequency    Min 3X/week      PT Plan Discharge plan needs to be updated    Co-evaluation              AM-PAC PT "6 Clicks" Mobility   Outcome Measure  Help needed turning from your back to your side while in a flat bed  without using bedrails?: None Help needed moving from lying on your back to sitting on the side of a flat bed without using bedrails?: None Help needed moving to and from a bed to a chair (including a wheelchair)?: None Help needed standing up from a chair using your arms (e.g., wheelchair or bedside chair)?: None Help needed to walk in hospital room?: None Help needed climbing 3-5 steps with a railing? : A Little 6 Click Score: 23    End of Session Equipment Utilized During Treatment: Gait belt Activity Tolerance: Patient tolerated treatment well Patient left: in chair;with call bell/phone within reach;with chair alarm set Nurse Communication: Mobility status PT Visit Diagnosis: Difficulty in walking, not elsewhere classified (R26.2)     Time: 6270-3500 PT Time Calculation (min) (ACUTE ONLY): 25 min  Charges:  $Gait Training: 8-22 mins $Therapeutic Activity: 8-22 mins                    Blondell Reveal Kistler PT 10/31/2021  Acute Rehabilitation Services  Office [phone]

## 2021-10-31 NOTE — Progress Notes (Signed)
Pt instructed on use of Flutter Valve.  Pt demonstrated with good effort and technique.  

## 2021-10-31 NOTE — Plan of Care (Signed)

## 2021-10-31 NOTE — Progress Notes (Signed)
Pt refused oral care for noon

## 2021-10-31 NOTE — Progress Notes (Addendum)
Mobility Specialist - Progress Note   10/31/21 0917  Mobility  HOB Elevated/Bed Position Self regulated  Activity Ambulated with assistance in room;Ambulated with assistance to bathroom  Range of Motion/Exercises Active  Level of Assistance Standby assist, set-up cues, supervision of patient - no hands on  Assistive Device Front wheel walker  Distance Ambulated (ft) 20 ft  Activity Response Tolerated well  Transport method Ambulatory  $Mobility charge 1 Mobility   Pt received in bed and agreeable to mobility. Pt only tolerated walking around the bed 1x. Pt stated she doesn't normally use O2 at home & wanted to try without it for this session.  Pt O2 at 83% when attempting to get out of bed. O2 was then put back on & O2 got back to 95%.  Pt requested assistance to restroom at [facility].  Per pt request, pt left in restroom to "wash-up" & told to press call bell when done and needs assistance back to bed.  NT made aware of this.     Pre-mobility (getting out of bed): 111bpm , 83% SpO2  Post-mobility: 97bpm HR, 95% SPO2     Chief Technology Officer

## 2021-11-01 DIAGNOSIS — J9621 Acute and chronic respiratory failure with hypoxia: Secondary | ICD-10-CM

## 2021-11-01 DIAGNOSIS — I5033 Acute on chronic diastolic (congestive) heart failure: Secondary | ICD-10-CM | POA: Diagnosis not present

## 2021-11-01 DIAGNOSIS — E878 Other disorders of electrolyte and fluid balance, not elsewhere classified: Secondary | ICD-10-CM | POA: Diagnosis not present

## 2021-11-01 DIAGNOSIS — E662 Morbid (severe) obesity with alveolar hypoventilation: Secondary | ICD-10-CM

## 2021-11-01 DIAGNOSIS — J9622 Acute and chronic respiratory failure with hypercapnia: Secondary | ICD-10-CM

## 2021-11-01 DIAGNOSIS — Z86711 Personal history of pulmonary embolism: Secondary | ICD-10-CM

## 2021-11-01 LAB — BASIC METABOLIC PANEL
Anion gap: 9 (ref 5–15)
BUN: 7 mg/dL (ref 6–20)
CO2: 35 mmol/L — ABNORMAL HIGH (ref 22–32)
Calcium: 7.6 mg/dL — ABNORMAL LOW (ref 8.9–10.3)
Chloride: 99 mmol/L (ref 98–111)
Creatinine, Ser: 0.67 mg/dL (ref 0.44–1.00)
GFR, Estimated: >60 mL/min (ref >60)
Glucose, Bld: 84 mg/dL (ref 70–99)
Potassium: 3 mmol/L — ABNORMAL LOW (ref 3.5–5.1)
Sodium: 143 mmol/L (ref 135–145)

## 2021-11-01 LAB — GLUCOSE, CAPILLARY
Glucose-Capillary: 81 mg/dL (ref 70–99)
Glucose-Capillary: 74 mg/dL (ref 70–99)
Glucose-Capillary: 91 mg/dL (ref 70–99)
Glucose-Capillary: 83 mg/dL (ref 70–99)
Glucose-Capillary: 101 mg/dL — ABNORMAL HIGH (ref 70–99)

## 2021-11-01 LAB — MAGNESIUM: Magnesium: 1.5 mg/dL — ABNORMAL LOW (ref 1.7–2.4)

## 2021-11-01 LAB — PHOSPHORUS: Phosphorus: 3.3 mg/dL (ref 2.5–4.6)

## 2021-11-01 MED ORDER — CALCIUM CARBONATE ANTACID 500 MG PO CHEW
400.0000 mg | CHEWABLE_TABLET | Freq: Three times a day (TID) | ORAL | Status: AC
  Administered 2021-11-01 – 2021-11-02 (×4): 400 mg via ORAL
  Filled 2021-11-01 (×4): qty 2

## 2021-11-01 MED ORDER — MAGNESIUM OXIDE -MG SUPPLEMENT 400 (240 MG) MG PO TABS
400.0000 mg | ORAL_TABLET | Freq: Two times a day (BID) | ORAL | Status: AC
Start: 2021-11-01 — End: 2021-11-02
  Administered 2021-11-01 – 2021-11-02 (×4): 400 mg via ORAL
  Filled 2021-11-01 (×4): qty 1

## 2021-11-01 MED ORDER — POTASSIUM CHLORIDE CRYS ER 20 MEQ PO TBCR
40.0000 meq | EXTENDED_RELEASE_TABLET | Freq: Two times a day (BID) | ORAL | Status: DC
  Administered 2021-11-01 (×2): 40 meq via ORAL
  Filled 2021-11-01 (×2): qty 2

## 2021-11-01 NOTE — Assessment & Plan Note (Signed)
In discussion with me today, the patient states she is unaware that she has heart and lung disease.  Asks appropriate questions about whether it can be "cured" or not, what are her plans for disposition. - Consult TOC for APS coordination, guardianship

## 2021-11-01 NOTE — Assessment & Plan Note (Signed)
-   Supplemented and resolved 

## 2021-11-01 NOTE — Assessment & Plan Note (Addendum)
Has chronic hypercarbic respiratory failure due to OSA/OHS/COPD/dCHF and presented in respiratory distress requiring BiPAP, acidotic, PCO2 80.  Give steroids and bronchodilators and IV fluids given AKI, but respiratory status deteriorated and she was intubated.   Subsequently treated with steroids, bronchodilators, diuretics, and able to be extubated.  Weaned to 3 L, stable now.

## 2021-11-01 NOTE — Progress Notes (Signed)
Checked pt's O2 status resting on 3 L [place]. She was 99%. Decreased to 2 L.  Mobility tech ambulated pt on RA, per RN request.  Pt became dyspneic toward last 3rd of ambulation (approx. 100 ft) & O2 decreased to 89%. Once repositioned to comfort in bed, pt's O2 increased to 99% on 2 L [place].  Will attempt to wean; as pt is only on BiPAP HS @ home, & update Dr. Maryfrances Bunnell, pt's attending.

## 2021-11-01 NOTE — Assessment & Plan Note (Signed)
Continue rivaroxaban.

## 2021-11-01 NOTE — Progress Notes (Signed)
Pt OOB & ambulated with assist & walker to restroom, prior to lasix administration. She provided her own peri-care, & was dyspneic with activity. Assess pt's O2 91 % on 2 L [place] & HR increased to 104.  Assisted pt back to bed, & placed new purwick in position & pt to comfort.  Night shift RN informed of need to attempt weaning pt off O2 during day, as this is not her baseline.

## 2021-11-01 NOTE — Progress Notes (Signed)
  Progress Note   PatientOfelia Podolski Warren ZMO:[id number] DOB: [birth year] DOA: 10/19/2021     13 DOS: the patient was seen and examined on 11/01/2021        Brief hospital course: Felicia Warren is a 51 y.o. F with MO/OHS/OSA, COPD, dCHF, and DM who presented because EMS were called by the motel where she was living because she could not exit the room so they could clean it and she had soiled herself for several days.    In the ER she was disoriented, acidotic, pCO2 80, Cr >2, BNP elevated, CXR without infiltrates.          Assessment and Plan: * Acute on chronic respiratory failure with hypoxia and hypercapnia  Obesity hypoventilation syndrome and obstructive sleep apnea COPD Exacerbation Has chronic hypercarbic respiratory failure due to OSA/OHS/COPD/dCHF and presented in respiratory distress requiring BiPAP, acidotic, PCO2 80.  Give steroids and bronchodilators and IV fluids given AKI, but respiratory status deteriorated and she was intubated.   Subsequently treated with steroids, bronchodilators, diuretics, and able to be extubated.  Weaned to 3 L, stable now.    Acute on chronic diastolic CHF (congestive heart failure) (HCC) Echo shows EF 60 to 65%, LVH, no significant valvular disease.  She was diuresed 10 L and is now stable on 3 L supplemental oxygen. - Continue furosemide and potassium - Stop Jardiance   Type 2 diabetes mellitus without complication (HCC) Glucose well controlled. Last hemoglobin A1c 6.2%   - Continue sliding scale corrections - Stop Jardiance   Urinary tract infection without hematuria Completed course of antibiotics for UTI.  Class 3 obesity (HCC) BMI 81.2  Hypocalcemia - Supplement calcium  Hypokalemia and hypomagnesemia - Supplement potassium, magnesium  Self neglect and cognitive impairment In discussion with me today, the patient states she is unaware that she has heart and lung disease.  Asks appropriate questions about whether it can be  "cured" or not, what are her plans for disposition. - Consult TOC for APS coordination, guardianship  History of pulmonary embolism - Continue rivaroxaban          Subjective: Patient's breathing feels tolerable.  No fever, no sputum, no confusion, no vomiting.     Physical Exam: BP (!) 128/57 (BP Location: Right Arm)   Pulse 71   Temp 98.6 F (37 C) (Oral)   Resp 16   Ht 5\' 1"  (1.549 m)   Wt (!) 195 kg   SpO2 93%   BMI 81.23 kg/m   Morbidly obese adult female, sitting up in recliner, interactive RRR, heart sounds distant and difficult to auscultate, no murmurs, no pitting in the lower extremities Respiratory rate seems relatively normal, at rest, however lung sounds are difficult to auscultate given body habitus Attention normal, affect appropriate, judgment and insight appear impaired, face symmetric, speech fluent  Data Reviewed: Patient metabolic panel shows hypokalemia, hypocalcemia, hypomagnesemia       Disposition: Status is: Inpatient         Author: , MD 11/01/2021 2:20 PM  For on call review [email or web address].

## 2021-11-01 NOTE — TOC Progression Note (Addendum)
Transition of Care [facility]) - Progression Note    Patient Details  Name: Felicia Warren MRN: [id number] Date of Birth: [birth year]  Transition of Care [facility]) CM/SW Contact  Noeh Sparacino, Olegario Messier, RN Phone Number: 11/01/2021, 3:51 PM  Clinical Narrative: Noted PT recc HHPT;On 02;APS following guardianship;will continue to monitor for d/c needs.      Expected Discharge Plan: Home w Home Health Services Barriers to Discharge: Continued Medical Work up  Expected Discharge Plan and Services Expected Discharge Plan: Home w Home Health Services       Living arrangements for the past 2 months: Homeless                                       Social Determinants of Health (SDOH) Interventions Housing Interventions: Inpatient TOC  Readmission Risk Interventions     No data to display

## 2021-11-01 NOTE — Progress Notes (Signed)
Mobility Specialist - Progress Note   11/01/21 1527  Mobility  HOB Elevated/Bed Position Self regulated  Activity Ambulated with assistance in hallway  Range of Motion/Exercises Active  Level of Assistance Standby assist, set-up cues, supervision of patient - no hands on  Assistive Device Front wheel walker  Distance Ambulated (ft) 175 ft  Activity Response Tolerated well  Transport method Ambulatory  $Mobility charge 1 Mobility   Pt received in bed and agreeable to mobility. O2 stats where checked on room air ambulation & given to the nurse at [facility] to forward to Dr. Pt to bed after session with all needs met & call bell in reach.    Felicia Warren Mobility Specialist  

## 2021-11-01 NOTE — Progress Notes (Signed)
Mobility Specialist - Progress Note   11/01/21 1031  Mobility  HOB Elevated/Bed Position Self regulated  Activity Ambulated with assistance to bathroom;Ambulated with assistance in room  Range of Motion/Exercises Active  Level of Assistance Modified independent, requires aide device or extra time  Assistive Device Front wheel walker  Distance Ambulated (ft) 20 ft  Activity Response Tolerated well  Transport method Ambulatory  $Mobility charge 1 Mobility   Pt received in bed and agreeable to mobility. Assisted pt & NT w/ ADLs in restroom.  Pt to recliner after session with all needs met.    [facility]

## 2021-11-02 DIAGNOSIS — J9621 Acute and chronic respiratory failure with hypoxia: Secondary | ICD-10-CM | POA: Diagnosis not present

## 2021-11-02 DIAGNOSIS — E878 Other disorders of electrolyte and fluid balance, not elsewhere classified: Secondary | ICD-10-CM | POA: Diagnosis not present

## 2021-11-02 DIAGNOSIS — I5033 Acute on chronic diastolic (congestive) heart failure: Secondary | ICD-10-CM | POA: Diagnosis not present

## 2021-11-02 LAB — RENAL FUNCTION PANEL
Albumin: 2.9 g/dL — ABNORMAL LOW (ref 3.5–5.0)
Anion gap: 8 (ref 5–15)
BUN: 6 mg/dL (ref 6–20)
CO2: 34 mmol/L — ABNORMAL HIGH (ref 22–32)
Calcium: 7.9 mg/dL — ABNORMAL LOW (ref 8.9–10.3)
Chloride: 99 mmol/L (ref 98–111)
Creatinine, Ser: 0.67 mg/dL (ref 0.44–1.00)
GFR, Estimated: >60 mL/min (ref >60)
Glucose, Bld: 86 mg/dL (ref 70–99)
Phosphorus: 3.5 mg/dL (ref 2.5–4.6)
Potassium: 3.3 mmol/L — ABNORMAL LOW (ref 3.5–5.1)
Sodium: 141 mmol/L (ref 135–145)

## 2021-11-02 LAB — GLUCOSE, CAPILLARY
Glucose-Capillary: 106 mg/dL — ABNORMAL HIGH (ref 70–99)
Glucose-Capillary: 119 mg/dL — ABNORMAL HIGH (ref 70–99)
Glucose-Capillary: 81 mg/dL (ref 70–99)
Glucose-Capillary: 87 mg/dL (ref 70–99)
Glucose-Capillary: 89 mg/dL (ref 70–99)
Glucose-Capillary: 93 mg/dL (ref 70–99)
Glucose-Capillary: 99 mg/dL (ref 70–99)

## 2021-11-02 LAB — MAGNESIUM: Magnesium: 1.7 mg/dL (ref 1.7–2.4)

## 2021-11-02 MED ORDER — POTASSIUM CHLORIDE CRYS ER 20 MEQ PO TBCR
40.0000 meq | EXTENDED_RELEASE_TABLET | Freq: Two times a day (BID) | ORAL | Status: DC
  Administered 2021-11-04 – 2021-11-27 (×47): 40 meq via ORAL
  Filled 2021-11-02 (×47): qty 2

## 2021-11-02 MED ORDER — POTASSIUM CHLORIDE CRYS ER 20 MEQ PO TBCR
40.0000 meq | EXTENDED_RELEASE_TABLET | Freq: Three times a day (TID) | ORAL | Status: AC
  Administered 2021-11-02 – 2021-11-03 (×6): 40 meq via ORAL
  Filled 2021-11-02 (×6): qty 2

## 2021-11-02 MED ORDER — POTASSIUM CHLORIDE CRYS ER 20 MEQ PO TBCR: 40.0000 meq | EXTENDED_RELEASE_TABLET | Freq: Three times a day (TID) | ORAL | Status: DC

## 2021-11-02 NOTE — Progress Notes (Signed)
Physical Therapy Treatment Patient Details Name: Felicia Warren MRN: [id number] DOB: 06/19/1970 Today's Date: 11/02/2021   History of Present Illness Pt is a 51 yo female with PMHx MO/OHS/OSA, COPD, dCHF, and DM who presented because EMS were called by the motel where she was living because she could not exit the room so they could clean it and she had soiled herself for several days. Pt admitted 10/19/21 with Acute on chronic respiratory failure with hypoxia and hypercapnia.   Intubated 8/28, self extubated 8/31. Pt's CXR negative for pna    PT Comments    Pt ambulated in hallway and tolerated improved distance.  Pt still requiring supplemental oxygen (see saturation qualification progress note).    Recommendations for follow up therapy are one component of a multi-disciplinary discharge planning process, led by the attending physician.  Recommendations may be updated based on patient status, additional functional criteria and insurance authorization.  Follow Up Recommendations  Home health PT     Assistance Recommended at Discharge Intermittent Supervision/Assistance  Patient can return home with the following A little help with walking and/or transfers;A little help with bathing/dressing/bathroom;Assistance with cooking/housework;Assist for transportation;Help with stairs or ramp for entrance   Equipment Recommendations  None recommended by PT    Recommendations for Other Services       Precautions / Restrictions Precautions Precautions: Fall     Mobility  Bed Mobility                    Transfers Overall transfer level: Needs assistance Equipment used: Rolling walker (2 wheels) Transfers: Sit to/from Stand Sit to Stand: Supervision           General transfer comment: increased time and effort to rise from recliner, reliant on rocking and UEs assist    Ambulation/Gait Ambulation/Gait assistance: Supervision Gait Distance (Feet): 240 Feet Assistive device:  Rolling walker (2 wheels) Gait Pattern/deviations: Step-through pattern, Decreased stride length, Wide base of support Gait velocity: decr     General Gait Details: steady, no loss of balance, monitored SPO2 (see O2 saturation qualification progress note), pt required at least 2L O2 [place], took 3 standing rest breaks with pursed lip breathing Pt left on 1L O2 (per RN request)   Stairs             Wheelchair Mobility    Modified Rankin (Stroke Patients Only)       Balance                                            Cognition Arousal/Alertness: Awake/alert Behavior During Therapy: WFL for tasks assessed/performed Overall Cognitive Status: Within Functional Limits for tasks assessed                                          Exercises      General Comments        Pertinent Vitals/Pain Pain Assessment Pain Assessment: No/denies pain    Home Living     Available Help at Discharge: Family;Available PRN/intermittently Type of Home: Other(Comment) (hotel)                  Prior Function            PT Goals (current goals can now be found in the  care plan section) Progress towards PT goals: Progressing toward goals    Frequency    Min 3X/week      PT Plan Current plan remains appropriate    Co-evaluation              AM-PAC PT "6 Clicks" Mobility   Outcome Measure  Help needed turning from your back to your side while in a flat bed without using bedrails?: None Help needed moving from lying on your back to sitting on the side of a flat bed without using bedrails?: None Help needed moving to and from a bed to a chair (including a wheelchair)?: None Help needed standing up from a chair using your arms (e.g., wheelchair or bedside chair)?: None Help needed to walk in hospital room?: None Help needed climbing 3-5 steps with a railing? : A Little 6 Click Score: 23    End of Session   Activity Tolerance:  Patient tolerated treatment well Patient left: in chair;with call bell/phone within reach Nurse Communication: Mobility status PT Visit Diagnosis: Difficulty in walking, not elsewhere classified (R26.2)     Time: 3244-0102 PT Time Calculation (min) (ACUTE ONLY): 25 min  Charges:  $Gait Training: 23-37 mins                    Thomasene Mohair PT, DPT Physical Therapist Acute Rehabilitation Services Preferred contact method: Secure Chat Weekend Pager Only: [phone] Office: [phone]    Janan Halter [place] 11/02/2021, 1:39 PM

## 2021-11-02 NOTE — Progress Notes (Signed)
  Patient Details Name: Felicia Warren MRN: [id number] DOB: [birth year]    SATURATION QUALIFICATIONS: (This note is used to comply with regulatory documentation for home oxygen)  Patient Saturations on Room Air at Rest = 93%  Patient Saturations on Room Air while Ambulating = 86%  Patient Saturations on 2 Liters of oxygen while Ambulating = 90%  Please briefly explain why patient needs home oxygen: to improve oxygen saturations during physical activities such as ADLs and ambulation  Thomasene Mohair PT, DPT Physical Therapist Acute Rehabilitation Services Preferred contact method: Secure Chat Weekend Pager Only: [phone] Office: [phone]

## 2021-11-02 NOTE — Progress Notes (Signed)
  Progress Note   PatientMalayla Granberry Warren NLZ:[id number] DOB: Dec 02, 1970 DOA: 10/19/2021     14 DOS: the patient was seen and examined on 11/02/2021        Brief hospital course: Felicia Warren is a 51 y.o. F with MO/OHS/OSA, COPD, dCHF, and DM who presented because EMS were called by the motel where she was living because she could not exit the room so they could clean it and she had soiled herself for several days.    In the ER she was disoriented, acidotic, pCO2 80, Cr >2, BNP elevated, CXR without infiltrates.          Assessment and Plan: *Acute on chronic respiratory failure with hypoxia and hypercapnia  Obesity hypoventilation syndrome and obstructive sleep apnea COPD Exacerbation Stable.  Multiple attempts to wean oxygen over the last 2 days, show that she needs about 2 L oxygen at all times   Acute on chronic diastolic CHF (congestive heart failure) (HCC) Continue furosemide, Coreg and potassium  Type 2 diabetes mellitus without complication (HCC) Glucose well controlled - Continue sliding scale corrections     History of pulmonary embolism - Continue Xarelto   Cognitive impairment Discussed with the patient again her medical diagnoses.  She has no recollection of our conversation yesterday.  She is unable to volunteer that she has chronic disease of her heart and lungs.  She is not able to articulate that her furosemide, Coreg Xarelto, and BiPAP are integral to her health       Subjective: No complaints, no fever, sputum, confusion.     Physical Exam: BP 111/88 (BP Location: Right Arm)   Pulse 84   Temp 98 F (36.7 C) (Oral)   Resp 17   Ht 5\' 1"  (1.549 m)   Wt (!) 196 kg   SpO2 100%   BMI 81.64 kg/m   Morbidly obese female, sitting up in recliner, interactive and appropriate RRR, heart sounds distant, no pitting Respiratory rate normal, lung sounds diminished due to body habitus, no rales or wheezing appreciated Face symmetric, speech fluent,  strength seems symmetric.  Data Reviewed: Basic metabolic panel shows mild hypokalemia, corrected calcium, normal sodium creatinine       Disposition: Status is: Inpatient         Author: , MD 11/02/2021 6:35 PM  For on call review [email or web address].

## 2021-11-02 NOTE — Progress Notes (Signed)
At [phone] PT requested to come off BiPAP. Placed PT on 3 LPM nasal cannula. PT does not appear to be in respiratory distress and is awake and appropriate at this time.

## 2021-11-02 NOTE — Evaluation (Signed)
Speech Language Pathology Evaluation Patient Details Name: Felicia Warren MRN: [id number] DOB: Jul 04, 1970 Today's Date: 11/02/2021 Time: 3875-6433 SLP Time Calculation (min) (ACUTE ONLY): 20 min  Problem List:  Patient Active Problem List   Diagnosis Date Noted   Obesity hypoventilation syndrome and obstructive sleep apnea 11/01/2021   Hypocalcemia 11/01/2021   Class 3 obesity (HCC) 10/24/2021   Acute on chronic diastolic CHF (congestive heart failure) (HCC)    Type 2 diabetes mellitus without complication (HCC)    Urinary tract infection without hematuria    Hypokalemia and hypomagnesemia 06/20/2021   Hypomagnesemia 06/20/2021   Physical deconditioning 06/18/2021   Iron deficiency anemia 06/18/2021   Chronic diastolic CHF (congestive heart failure) (HCC) 06/18/2021   Involuntary commitment 06/17/2021   Self neglect and cognitive impairment 06/17/2021   OSA/OHS on CPAP 06/17/2021   Acute respiratory failure with hypoxia (HCC) 06/16/2021   Acute on chronic respiratory failure with hypoxia and hypercapnia (HCC) 06/16/2021   BMI 60.0-69.9, adult (HCC) 09/17/2020   Prediabetes 09/17/2020   Hypokalemia    Acute metabolic encephalopathy 07/05/2020   History of pulmonary embolism 07/05/2020   Past Medical History:  Past Medical History:  Diagnosis Date   Acute respiratory failure with hypoxia and hypercarbia (HCC)    AKI (acute kidney injury) (HCC)    Community acquired pneumonia    Demand ischemia (HCC)    Diarrhea    Hypercapnic respiratory failure (HCC) 06/25/2020   Hypoglycemia    Hypokalemia    Morbid obesity (HCC)    Normocytic anemia    OSA treated with BiPAP    Pulmonary embolism (HCC)    Transaminitis    Past Surgical History: History reviewed. No pertinent surgical history. HPI:  51 yo female with pmh dm2, hfpEF, chronic osa non adherent with home niv. [employer], FTT - drowsy found to be hypercarbic with AKI, acute liver injury, and elevated bnp/trop with known HFpef,  morbid obesity.   Intubated 8/28, self extubated 8/31. Pt's CXR negative for pna --.  Swallow eval ordered.  Per MD note, pt at high risk of requiring reintubation- RN reports pt wants to eat.  Pt was sleepy last night but woke adequatley today for self feeding.  Voice is strong and she reports this is normal at this time.  Intermittent throat clearing observed at baseline and with intake. MD placed order on 11/01/21 for cognitive evaluation.   Assessment / Plan / Recommendation Clinical Impression  Patient participated in completing cognitive assessment with SLP administering the SLUMS examination. 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 She exhibited deficit in delayed recall which appeared to be secondary to poor retrieval rather than storage as patient was able to recall two more of the five words presented when given extra time to process. When trying to recall words she did tell SLP, "you got me all jumbled up". Although patient appeared with a generally flat affect, she was adequately engaged in the testing, self-correcting, taking extra time to consider responses and asking SLP how she did with short story recall. She told SLP, "I have a lot on my mind". When asked how she felt she did, she told SLP she thinks she would have had difficulty with this test even prior to this hospitalization.  As per informal and formal testing, patient is exhibiting mild cognitive impairment impacting her delayed/short term recall and cognitive processing and higher level  attention. Her awareness, reasoning, and basic problem solving abilities all appear WFL. SLP is recommending HH SLP services to ensure that patient is able to accurately perform more complex ADL tasks such as medication management, financial management.    SLP Assessment  SLP Recommendation/Assessment: All further Speech Lanaguage  Pathology  needs can be addressed in the next venue of care SLP Visit Diagnosis: Cognitive communication deficit (R41.841)    Recommendations for follow up therapy are one component of a multi-disciplinary discharge planning process, led by the attending physician.  Recommendations may be updated based on patient status, additional functional criteria and insurance authorization.    Follow Up Recommendations  Home health SLP    Assistance Recommended at Discharge  Intermittent Supervision/Assistance  Functional Status Assessment Patient has had a recent decline in their functional status and demonstrates the ability to make significant improvements in function in a reasonable and predictable amount of time.  Frequency and Duration           SLP Evaluation Cognition  Overall Cognitive Status: No family/caregiver present to determine baseline cognitive functioning Arousal/Alertness: Awake/alert Orientation Level: Oriented X4 Year: 2023 Month: August Day of Week: Correct Attention: Sustained;Selective Sustained Attention: Appears intact Selective Attention: Impaired Selective Attention Impairment: Verbal complex Memory: Impaired Memory Impairment: Retrieval deficit Awareness: Appears intact Problem Solving: Impaired Problem Solving Impairment: Verbal complex Safety/Judgment: Appears intact       Comprehension  Auditory Comprehension Overall Auditory Comprehension: Appears within functional limits for tasks assessed    Expression Expression Primary Mode of Expression: Verbal Verbal Expression Overall Verbal Expression: Appears within functional limits for tasks assessed   Oral / Motor  Oral Motor/Sensory Function Overall Oral Motor/Sensory Function: Within functional limits Motor Speech Overall Motor Speech: Appears within functional limits for tasks assessed    Angela Nevin, MA, CCC-SLP Speech Therapy

## 2021-11-02 NOTE — Progress Notes (Signed)
Mobility Specialist - Progress Note   11/02/21 1148  Mobility  Activity Ambulated with assistance to bathroom  Range of Motion/Exercises Active  Level of Assistance Standby assist, set-up cues, supervision of patient - no hands on  Assistive Device Front wheel walker  Distance Ambulated (ft) 10 ft  Activity Response Tolerated well  $Mobility charge 1 Mobility   Pt was found in bed and needed assistance to the bathroom. Pt was left in the bathroom with NT.   Billey Chang Mobility Specialist

## 2021-11-02 NOTE — Progress Notes (Signed)
   11/02/21 0917 11/02/21 0918 11/02/21 0922  Oxygen Therapy  SpO2 (!) 85 % 90 % 92 %  O2 Device Room Air Nasal Cannula Nasal Cannula  O2 Flow Rate (L/min) 0 L/min 2 L/min 2.5 L/min  Patient Activity (if Appropriate) In bed In bed In bed  Pulse Oximetry Type Intermittent Intermittent Intermittent

## 2021-11-03 DIAGNOSIS — I5033 Acute on chronic diastolic (congestive) heart failure: Secondary | ICD-10-CM | POA: Diagnosis not present

## 2021-11-03 DIAGNOSIS — E878 Other disorders of electrolyte and fluid balance, not elsewhere classified: Secondary | ICD-10-CM | POA: Diagnosis not present

## 2021-11-03 DIAGNOSIS — J9621 Acute and chronic respiratory failure with hypoxia: Secondary | ICD-10-CM | POA: Diagnosis not present

## 2021-11-03 LAB — GLUCOSE, CAPILLARY
Glucose-Capillary: 81 mg/dL (ref 70–99)
Glucose-Capillary: 88 mg/dL (ref 70–99)
Glucose-Capillary: 91 mg/dL (ref 70–99)
Glucose-Capillary: 91 mg/dL (ref 70–99)
Glucose-Capillary: 102 mg/dL — ABNORMAL HIGH (ref 70–99)

## 2021-11-03 LAB — POTASSIUM: Potassium: 3.6 mmol/L (ref 3.5–5.1)

## 2021-11-03 NOTE — TOC Progression Note (Signed)
Transition of Care [facility]) - Progression Note    Patient Details  Name: Felicia Warren MRN: [id number] Date of Birth: [birth year]  [facility] CM/SW Contact  Nashla Althoff, Olegario Messier, RN Phone Number: 11/03/2021, 3:59 PM  Clinical Narrative:  Lowella Dell managed care rep for patient-Sherry (transition care mgr ) tel#[phone]/c#[phone]-she will f/u housing resources(1x $250 housing cost), & to contact APS rep Dorene Sorrow [phone]-in order to find out outcome of guardianship , & who she can talk to since patient lacks medical decision making skills.On 02 will monitor.    Expected Discharge Plan: Home w Home Health Services Barriers to Discharge: Continued Medical Work up  Expected Discharge Plan and Services Expected Discharge Plan: Home w Home Health Services       Living arrangements for the past 2 months: Homeless                                       Social Determinants of Health (SDOH) Interventions Housing Interventions: Inpatient TOC  Readmission Risk Interventions     No data to display

## 2021-11-03 NOTE — Progress Notes (Signed)
Progress Note   PatientReganne Messerschmidt Warren GYI:[id number] DOB: 15-Feb-1971 DOA: 10/19/2021     15 DOS: the patient was seen and examined on 11/03/2021 at 2:13PM      Brief hospital course: Felicia Warren is a 51 y.o. F with MO/OHS/OSA, COPD, dCHF, and DM who presented because EMS were called by the motel where she was living because she could not exit the room so they could clean it and she had soiled herself for several days.     In the ER she was disoriented, acidotic, pCO2 80, Cr >2, BNP elevated, CXR without infiltrates.        8/28: Admitted for COPD 8/29: Failed BiPAP and intubated 8/31: Extubated 9/2: Transferred [facility], Psychiatry consulted, felt patient does not have capacity  9/7: Transitioned to oral diuretics     Assessment and Plan: Obesity hypoventilation syndrome and obstructive sleep apnea Has chronic hypercarbic respiratory failure due to OSA/OHS/COPD/dCHF and presented in respiratory distress requiring BiPAP, acidotic, PCO2 80.  Give steroids and bronchodilators and IV fluids given AKI, but respiratory status deteriorated and she was intubated.   Subsequently treated with steroids, bronchodilators, diuretics, and able to be extubated.  Weaned to 3 L, stable now.   Acute on chronic diastolic CHF (congestive heart failure) (HCC) Echo shows EF 60 to 65%, LVH, no significant valvular disease.  She was diuresed 10 L and is now stable on 3 L supplemental oxygen. - Continue furosemide and potassium - Stop Jardiance   Type 2 diabetes mellitus without complication (HCC) Glucose well controlled. Last hemoglobin A1c 6.2%   - Continue sliding scale corrections - Stop Jardiance   Urinary tract infection without hematuria Completed course of antibiotics for UTI.  Class 3 obesity (HCC) BMI 81.2  Hypocalcemia Supplemented and resolved.  Hypokalemia and hypomagnesemia Supplemented and resolved.  Self neglect and cognitive impairment Earlier during this hospitalization,  Psychiatry were consulted and agreed with attending that patient did not appear to have capacity to safely manage her complex cardiopulmonary disease.  APS were engaged and guardianship is being sought.  In each of the last three days, I have engaged the patient is discussion about her health conditions and the plan of care.  Notwithstanding that she cannot recapitulate the key details of her health situation even in very broad terms (she has "heart failure" or "heart disease", she has "obesity related lung disease" or "chronic lung disease") as these are kind of abstract ideas, even when asked concrete and direct questions ("who is your primary care doctor?" "how will you get an appointment with your primary care doctor?" "how would you get a referral to a heart specialist?"), she has deficits in problem solving and executive function that do not appear sufficient to safely care for herself alone.  At no point has she been able to articulate coherent plans for treatment or alternatives to treatment.  I concur that she does not have capacity to refuse SNF placement. - TOC consult for APS referral and guardianship     History of pulmonary embolism - Continue rivaroxaban          Subjective: Patient has no complaints, she has some itching of the skin.  No fever, chest pain, confusion.     Physical Exam: BP 113/68 (BP Location: Right Arm)   Pulse 71   Temp (!) 97.4 F (36.3 C) (Oral)   Resp 20   Ht 5\' 1"  (1.549 m)   Wt (!) 196 kg   SpO2 98%   BMI 81.64 kg/m  Morbidly obese adult female, sitting up in recliner, interactive RRR, heart sounds distant, no pitting in the lower extremities Respiratory rate normal, lung sounds diminished due to body habitus, no rales or wheezing appreciated Attention normal, affect blunted, and flat, face symmetric, speech fluent, strength symmetric, gait shuffling and slow  Data Reviewed: Basic metabolic panel shows normal potassium, stable renal  function       Disposition: Status is: Inpatient The patient was admitted for respiratory failure, she was initially treated for COPD and given IV fluids, then she had worsening respiratory failure and was intubated and diuresis was started.  She was diuresed 10 L, and weaned to 3 L oxygen.  In the meantime psychiatry evaluated the patient, as there is a question that she lacks capacity.  Improved in fact, they feel that she does not have capacity to refuse SNF placement, and this seems to be persistent even at this time.  APS is working on guardianship, and the patient is medically stable to discharge when [facility] find a safe disposition.  There was a question at some point of whether the patient should be placed under IVC should she choose to leave AMA, although I think this is moot, as I believe she is physically incapable of leaving the hospital without help given her weight and fatigue and shortness of breath.        Author: Alberteen Sam, MD 11/03/2021 7:03 PM  For on call review [email or web address].

## 2021-11-03 NOTE — Progress Notes (Signed)
Mobility Specialist - Progress Note   11/03/21 1401  Mobility  HOB Elevated/Bed Position Self regulated  Activity Ambulated with assistance in hallway  Range of Motion/Exercises Active  Level of Assistance Standby assist, set-up cues, supervision of patient - no hands on  Assistive Device Front wheel walker  Distance Ambulated (ft) 150 ft  Activity Response Tolerated well  Transport method Ambulatory  $Mobility charge 1 Mobility   Pt received in recliner and agreeable to mobility. C/o knee stiffness during mobility which may have been allocated to the pt sitting the recliner all day.  Pt to recliner after session with all needs met.    [facility]

## 2021-11-03 NOTE — Progress Notes (Signed)
Mobility Specialist - Progress Note   11/03/21 0921  Mobility  HOB Elevated/Bed Position Self regulated  Activity Ambulated with assistance in hallway  Range of Motion/Exercises Active  Level of Assistance Standby assist, set-up cues, supervision of patient - no hands on  Assistive Device Front wheel walker  Distance Ambulated (ft) 145 ft  Activity Response Tolerated well  Transport method Ambulatory  $Mobility charge 1 Mobility   Pt received in recliner and agreeable to mobility.  Visible SOB towards EOS. Pt to recliner after session with all needs met.    [facility]

## 2021-11-04 DIAGNOSIS — J9622 Acute and chronic respiratory failure with hypercapnia: Secondary | ICD-10-CM | POA: Diagnosis not present

## 2021-11-04 DIAGNOSIS — J9621 Acute and chronic respiratory failure with hypoxia: Secondary | ICD-10-CM | POA: Diagnosis not present

## 2021-11-04 LAB — GLUCOSE, CAPILLARY
Glucose-Capillary: 108 mg/dL — ABNORMAL HIGH (ref 70–99)
Glucose-Capillary: 91 mg/dL (ref 70–99)
Glucose-Capillary: 93 mg/dL (ref 70–99)
Glucose-Capillary: 87 mg/dL (ref 70–99)
Glucose-Capillary: 87 mg/dL (ref 70–99)
Glucose-Capillary: 108 mg/dL — ABNORMAL HIGH (ref 70–99)

## 2021-11-04 NOTE — Progress Notes (Signed)
Mobility Specialist - Progress Note   11/04/21 0927  Mobility  HOB Elevated/Bed Position Self regulated  Activity Ambulated with assistance in hallway  Range of Motion/Exercises Active  Level of Assistance Standby assist, set-up cues, supervision of patient - no hands on  Assistive Device Front wheel walker  Distance Ambulated (ft) 190 ft  Activity Response Tolerated well  Transport method Ambulatory  $Mobility charge 1 Mobility   Pt received in room w/ nurse. Pt was eager to walk & pushed herself beyond the normal distance that we have been walking! Pt was sweating towards EOS, but no complaints of lightheadedness, or pain. Pt to recliner at EOS with necessities & call bell in reach.   [facility]

## 2021-11-04 NOTE — Progress Notes (Signed)
Mobility Specialist - Progress Note   11/04/21 1334  Mobility  HOB Elevated/Bed Position Self regulated  Activity Ambulated with assistance to bathroom (Recliner exercises)  Range of Motion/Exercises Active;Left arm;Right arm  Level of Assistance Modified independent, requires aide device or extra time  Assistive Device Front wheel walker  Distance Ambulated (ft) 20 ft  Activity Response Tolerated well  Transport method Ambulatory  $Mobility charge 1 Mobility   Pt received in recliner & agreeable to mobilize & do some band exercises with her arms. Pt did band exercises that worked on the biceps & triceps. Pt O2 dropped to 88% during exercise but in a matter of seconds O2 went back up to 98%. Assisted pt to restroom at EOS. Pt left in recliner w/ all needs met & call bell in reach.     During mobility: 88% SpO2 Post-mobility: 98% SPO2  Danelle Berry Mobility Specialist   [facility] Mobility Specialist

## 2021-11-04 NOTE — Plan of Care (Signed)
  Problem: Education: Goal: Knowledge of General Education information will improve Description Including pain rating scale, medication(s)/side effects and non-pharmacologic comfort measures Outcome: Progressing   Problem: Health Behavior/Discharge Planning: Goal: Ability to manage health-related needs will improve Outcome: Progressing   

## 2021-11-04 NOTE — Progress Notes (Signed)
Physical Therapy Treatment Patient Details Name: Felicia Warren MRN: [id number] DOB: [birth year] Today's Date: 11/04/2021   History of Present Illness Pt is a 51 yo female with PMHx MO/OHS/OSA, COPD, dCHF, and DM who presented because EMS were called by the motel where she was living because she could not exit the room so they could clean it and she had soiled herself for several days. Pt admitted 10/19/21 with Acute on chronic respiratory failure with hypoxia and hypercapnia.   Intubated 8/28, self extubated 8/31. Pt's CXR negative for pna    PT Comments    Pt used bathroom and then ambulated good distance in hallway.  Pt able to ambulate without oxygen today and SpO2 mostly 93% on room air.  RN notified.  Pt's mobility has improved and currently supervision level, and pt no longer requiring oxygen.  Per MD notes, "Self neglect and cognitive impairment -Earlier during this hospitalization, Psychiatry were consulted and agreed with attending that patient did not appear to have capacity to safely manage her complex cardiopulmonary disease.  APS were engaged and guardianship is being sought."  Uncertain of d/c plan however pt's mobility improved and no longer requiring f/u PT.  Pt would benefit from RW (may need bariatric) upon d/c. Recommend mobility specialists and/or nursing staff continue to encourage OOB activity and ambulation during remainder of hospital stay.   Recommendations for follow up therapy are one component of a multi-disciplinary discharge planning process, led by the attending physician.  Recommendations may be updated based on patient status, additional functional criteria and insurance authorization.  Follow Up Recommendations  No PT follow up Can patient physically be transported by private vehicle: Yes   Assistance Recommended at Discharge Intermittent Supervision/Assistance  Patient can return home with the following Assistance with cooking/housework;Direct supervision/assist for  medications management;Direct supervision/assist for financial management;Assist for transportation   Equipment Recommendations  Rolling walker (2 wheels)    Recommendations for Other Services       Precautions / Restrictions Precautions Precautions: Fall     Mobility  Bed Mobility Overal bed mobility: Needs Assistance       Supine to sit: Supervision, HOB elevated          Transfers Overall transfer level: Needs assistance Equipment used: Rolling walker (2 wheels) Transfers: Sit to/from Stand Sit to Stand: Supervision           General transfer comment: increased time and effort, reliant on rocking and UEs assist    Ambulation/Gait Ambulation/Gait assistance: Supervision Gait Distance (Feet): 280 Feet Assistive device: Rolling walker (2 wheels) Gait Pattern/deviations: Step-through pattern, Decreased stride length, Wide base of support Gait velocity: decr     General Gait Details: steady, no loss of balance, monitored SPO2 on room air and mostly 93% (did not go below 90%) and pt denies SOB; (RN aware of sats and okay for leaving pt on room air upon sitting in recliner)   Stairs             Wheelchair Mobility    Modified Rankin (Stroke Patients Only)       Balance                                            Cognition Arousal/Alertness: Awake/alert Behavior During Therapy: WFL for tasks assessed/performed Overall Cognitive Status: Within Functional Limits for tasks assessed  Exercises      General Comments        Pertinent Vitals/Pain Pain Assessment Pain Assessment: No/denies pain    Home Living                          Prior Function            PT Goals (current goals can now be found in the care plan section) Progress towards PT goals: Goals met/education completed, patient discharged from PT    Frequency           PT Plan  Discharge plan needs to be updated;Equipment recommendations need to be updated    Co-evaluation              AM-PAC PT "6 Clicks" Mobility   Outcome Measure  Help needed turning from your back to your side while in a flat bed without using bedrails?: None Help needed moving from lying on your back to sitting on the side of a flat bed without using bedrails?: None Help needed moving to and from a bed to a chair (including a wheelchair)?: None Help needed standing up from a chair using your arms (e.g., wheelchair or bedside chair)?: None Help needed to walk in hospital room?: None Help needed climbing 3-5 steps with a railing? : A Little 6 Click Score: 23    End of Session   Activity Tolerance: Patient tolerated treatment well Patient left: in chair;with call bell/phone within reach Nurse Communication: Mobility status PT Visit Diagnosis: Difficulty in walking, not elsewhere classified (R26.2)     Time: 2091-9802 PT Time Calculation (min) (ACUTE ONLY): 20 min  Charges:  $Gait Training: 8-22 mins                     Felicia Warren, Felicia Warren Physical Therapist Acute Rehabilitation Services Preferred contact method: Secure Chat Weekend Pager Only: [phone] Office: [phone]     Felicia Warren 11/04/2021, 3:59 PM

## 2021-11-04 NOTE — Plan of Care (Signed)
  Problem: Clinical Measurements: Goal: Ability to maintain clinical measurements within normal limits will improve Outcome: Progressing Goal: Respiratory complications will improve Outcome: Progressing Goal: Cardiovascular complication will be avoided Outcome: Progressing   Problem: Activity: Goal: Risk for activity intolerance will decrease Outcome: Progressing   

## 2021-11-04 NOTE — Progress Notes (Signed)
PROGRESS NOTE  Felicia Warren [id number]:[id number] DOB: Sep 24, 1970 DOA: 10/19/2021 PCP: Storm Frisk, MD   LOS: 16 days   Brief Narrative / Interim history: 51 year old female with morbid obesity, OHS/OSA, COPD, diastolic CHF, DM 2, presented to the hospital because EMS were called by the [facility] where she was living as she could not exit the room so they could clean it, and has soiled herself for several days.  She was disoriented in the ED, acidotic with a PCO2 of 80, acute kidney injury and was admitted to the hospital.  Significant events: 8/28: Admitted for COPD 8/29: Failed BiPAP and intubated 8/31: Extubated 9/2: Transferred out of the ICU, Psychiatry consulted, felt patient does not have capacity  9/7: Transitioned to oral diuretics  Significant imaging / results / micro data: CXR 8/28-no acute cardiopulmonary disease Urine culture 8/28-70,000 colonies lactobacillus COVID, RVP 8/28-negative  Subjective / 24h Interval events: BiPAP on, has no complaints and is feeling comfortable.  Assesement and Plan: Principal Problem:   Acute on chronic respiratory failure with hypoxia and hypercapnia (HCC) Active Problems:   Obesity hypoventilation syndrome and obstructive sleep apnea   Acute on chronic diastolic CHF (congestive heart failure) (HCC)   Type 2 diabetes mellitus without complication (HCC)   Urinary tract infection without hematuria   Class 3 obesity (HCC)   History of pulmonary embolism   Self neglect and cognitive impairment   Hypokalemia and hypomagnesemia   Hypocalcemia   Principal problem Obesity hypoventilation syndrome and obstructive sleep apnea -Has chronic hypercarbic respiratory failure due to OSA/OHS/COPD/dCHF and presented in respiratory distress requiring BiPAP, acidotic, PCO2 80.  Give steroids and bronchodilators and IV fluids given AKI, but respiratory status deteriorated and she was intubated.  She was treated with steroids, bronchodilators, diuretics and  eventually extubated.  She was weaned off to 3 L nasal cannula, and has remained stable since.   Active problems Acute on chronic diastolic CHF (congestive heart failure) (HCC)-Echo shows EF 60 to 65%, LVH, no significant valvular disease.  She was diuresed, net -11 L, respiratory status has remained stable.  Continue oral furosemide   Type 2 diabetes mellitus without complication (HCC) -Glucose well controlled. Last hemoglobin A1c 6.2%.  Continue sliding scale CBG (last 3)  Recent Labs    11/04/21 0434 11/04/21 0804 11/04/21 1225  GLUCAP 91 93 87     Urinary tract infection without hematuria -Completed course of antibiotics for UTI.   Class 3 obesity (HCC) -BMI 81.2   Hypocalcemia -Supplemented and resolved.   Hypokalemia and hypomagnesemia -Supplemented and resolved.   Self neglect and cognitive impairment -Earlier during this hospitalization, Psychiatry were consulted and agreed with attending that patient did not appear to have capacity to safely manage her complex cardiopulmonary disease.  APS were engaged and guardianship is being sought.   History of pulmonary embolism -Continue rivaroxaban  Scheduled Meds:  (feeding supplement) PROSource Plus  30 mL Oral BID BM   carvedilol  3.125 mg Oral BID WC   furosemide  40 mg Oral BID   insulin aspart  0-15 Units Subcutaneous Q4H   multivitamin with minerals  1 tablet Oral Daily   mouth rinse  15 mL Mouth Rinse 4 times per day   potassium chloride  40 mEq Oral BID   Ensure Max Protein  11 oz Oral BID   rivaroxaban  10 mg Oral Daily   Continuous Infusions:  sodium chloride 10 mL/hr at 10/29/21 1307   PRN Meds:.sodium chloride, albuterol, diphenhydrAMINE-zinc acetate, docusate sodium, polyethylene  glycol  Current Outpatient Medications  Medication Instructions   Multiple Vitamin (MULTIVITAMIN WITH MINERALS) TABS tablet 1 tablet, Oral, Daily   pantoprazole (PROTONIX) 40 mg, Oral, Daily at bedtime   torsemide (DEMADEX) 20 mg,  Oral, Daily    Diet Orders (From admission, onward)     Start     Ordered   10/25/21 1448  Diet Carb Modified Fluid consistency: Thin; Room service appropriate? Yes; Fluid restriction: 1200 mL Fluid  Diet effective now       Question Answer Comment  Diet-HS Snack? Nothing   Calorie Level Medium 1600-2000   Fluid consistency: Thin   Room service appropriate? Yes   Fluid restriction: 1200 mL Fluid      10/25/21 1447            DVT prophylaxis: rivaroxaban (XARELTO) tablet 10 mg Start: 10/23/21 1300 SCDs Start: 10/19/21 2107 rivaroxaban (XARELTO) tablet 10 mg   Lab Results  Component Value Date   PLT 163 10/29/2021      Code Status: Full Code  Family Communication: no family at bedside   Status is: Inpatient  Remains inpatient appropriate because: placement  Level of care: Med-Surg  Consultants:  Psychiatry  PCCM  Objective: Vitals:   11/03/21 2300 11/04/21 0438 11/04/21 0441 11/04/21 0900  BP:   118/60 121/71  Pulse: 74  69 72  Resp: (!) 24  18 19   Temp:   98.3 F (36.8 C) 98.7 F (37.1 C)  TempSrc:   Oral Oral  SpO2: 96%  100% 98%  Weight:  (!) 196 kg    Height:        Intake/Output Summary (Last 24 hours) at 11/04/2021 1240 Last data filed at 11/04/2021 1058 Gross per 24 hour  Intake 240 ml  Output 1152 ml  Net -912 ml   Wt Readings from Last 3 Encounters:  11/04/21 (!) 196 kg  09/17/20 (!) 177.4 kg  09/08/20 (!) 176.5 kg    Examination:  Constitutional: NAD Eyes: no scleral icterus ENMT: Mucous membranes are moist.  Neck: normal, supple Respiratory: clear to auscultation bilaterally, no wheezing, no crackles. Cardiovascular: Regular rate and rhythm, no murmurs / rubs / gallops. Abdomen: non distended, no tenderness. Bowel sounds positive.  Musculoskeletal: no clubbing / cyanosis.  Skin: no rashes Neurologic: non focal   Data Reviewed: I have independently reviewed following labs and imaging studies   CBC Recent Labs  Lab  10/29/21 0937  WBC 6.6  HGB 11.5*  HCT 41.6  PLT 163  MCV 80.9  MCH 22.4*  MCHC 27.6*  RDW 22.7*  LYMPHSABS 0.8  MONOABS 0.5  EOSABS 0.4  BASOSABS 0.0    Recent Labs  Lab 10/29/21 0937 10/30/21 1236 11/01/21 0529 11/02/21 0453 11/03/21 0456  NA 143 138 143 141  --   K 2.9* 3.4* 3.0* 3.3* 3.6  CL 99 97* 99 99  --   CO2 37* 34* 35* 34*  --   GLUCOSE 102* 104* 84 86  --   BUN 11 9 7 6   --   CREATININE 0.79 0.82 0.67 0.67  --   CALCIUM 8.0* 7.8* 7.6* 7.9*  --   ALBUMIN  --   --   --  2.9*  --   MG 1.6* 1.7 1.5* 1.7  --     ------------------------------------------------------------------------------------------------------------------ No results for input(s): "CHOL", "HDL", "LDLCALC", "TRIG", "CHOLHDL", "LDLDIRECT" in the last 72 hours.  Lab Results  Component Value Date   HGBA1C 6.2 (H) 06/18/2021   ------------------------------------------------------------------------------------------------------------------ No  results for input(s): "TSH", "T4TOTAL", "T3FREE", "THYROIDAB" in the last 72 hours.  Invalid input(s): "FREET3"  Cardiac Enzymes No results for input(s): "CKMB", "TROPONINI", "MYOGLOBIN" in the last 168 hours.  Invalid input(s): "CK" ------------------------------------------------------------------------------------------------------------------    Component Value Date/Time   BNP 778.8 (H) 10/19/2021 1837    CBG: Recent Labs  Lab 11/03/21 2024 11/04/21 0052 11/04/21 0434 11/04/21 0804 11/04/21 1225  GLUCAP 102* 108* 91 93 87    No results found for this or any previous visit (from the past 240 hour(s)).   Radiology Studies: No results found.   Pamella Pert, MD, PhD [facility]  Between 7 am - 7 pm I am available, please contact me via Amion (for emergencies) or Securechat (non urgent messages)  Between 7 pm - 7 am I am not available, please contact night coverage MD/APP via Amion

## 2021-11-05 DIAGNOSIS — J9622 Acute and chronic respiratory failure with hypercapnia: Secondary | ICD-10-CM | POA: Diagnosis not present

## 2021-11-05 DIAGNOSIS — J9621 Acute and chronic respiratory failure with hypoxia: Secondary | ICD-10-CM | POA: Diagnosis not present

## 2021-11-05 LAB — COMPREHENSIVE METABOLIC PANEL
ALT: 17 U/L (ref 0–44)
AST: 18 U/L (ref 15–41)
Albumin: 2.9 g/dL — ABNORMAL LOW (ref 3.5–5.0)
Alkaline Phosphatase: 62 U/L (ref 38–126)
Anion gap: 6 (ref 5–15)
BUN: 7 mg/dL (ref 6–20)
CO2: 32 mmol/L (ref 22–32)
Calcium: 8.6 mg/dL — ABNORMAL LOW (ref 8.9–10.3)
Chloride: 103 mmol/L (ref 98–111)
Creatinine, Ser: 0.7 mg/dL (ref 0.44–1.00)
GFR, Estimated: >60 mL/min (ref >60)
Glucose, Bld: 87 mg/dL (ref 70–99)
Potassium: 4 mmol/L (ref 3.5–5.1)
Sodium: 141 mmol/L (ref 135–145)
Total Bilirubin: 1.1 mg/dL (ref 0.3–1.2)
Total Protein: 5.6 g/dL — ABNORMAL LOW (ref 6.5–8.1)

## 2021-11-05 LAB — CBC
HCT: 37.2 % (ref 36.0–46.0)
Hemoglobin: 10.3 g/dL — ABNORMAL LOW (ref 12.0–15.0)
MCH: 22.4 pg — ABNORMAL LOW (ref 26.0–34.0)
MCHC: 27.7 g/dL — ABNORMAL LOW (ref 30.0–36.0)
MCV: 81 fL (ref 80.0–100.0)
Platelets: 183 10*3/uL (ref 150–400)
RBC: 4.59 MIL/uL (ref 3.87–5.11)
RDW: 23.8 % — ABNORMAL HIGH (ref 11.5–15.5)
WBC: 7 10*3/uL (ref 4.0–10.5)
nRBC: 0 % (ref 0.0–0.2)

## 2021-11-05 LAB — GLUCOSE, CAPILLARY
Glucose-Capillary: 86 mg/dL (ref 70–99)
Glucose-Capillary: 88 mg/dL (ref 70–99)
Glucose-Capillary: 103 mg/dL — ABNORMAL HIGH (ref 70–99)

## 2021-11-05 LAB — PHOSPHORUS: Phosphorus: 4.5 mg/dL (ref 2.5–4.6)

## 2021-11-05 LAB — MAGNESIUM: Magnesium: 1.8 mg/dL (ref 1.7–2.4)

## 2021-11-05 MED ORDER — ORAL CARE MOUTH RINSE
15.0000 mL | OROMUCOSAL | Status: DC
  Administered 2021-11-06 – 2021-11-27 (×73): 15 mL via OROMUCOSAL

## 2021-11-05 MED ORDER — ORAL CARE MOUTH RINSE: 15.0000 mL | OROMUCOSAL | Status: DC | PRN

## 2021-11-05 NOTE — Progress Notes (Signed)
Mobility Specialist - Progress Note   11/05/21 0927  Mobility  HOB Elevated/Bed Position Self regulated  Activity Ambulated with assistance in hallway  Range of Motion/Exercises Active  Level of Assistance Modified independent, requires aide device or extra time  Assistive Device Front wheel walker  Distance Ambulated (ft) 125 ft  Activity Response Tolerated well  Transport method Ambulatory  $Mobility charge 1 Mobility   Pt received in recliner and agreeable to mobility and Thera-band exercises. Pt was able to do 3 different seated leg exercises that included using the theraband. Pt then requested to ambulate in hallway. During ambulation pt O2  dropped to 88% but was able to recover back up to 97% in a matter of seconds. Pt requested to do exercises outside this afternoon. Nurse notified of this request & waiting approval. Pt left in recliner w/ all needs met & call bell in reach.    During mobility: 88% SpO2 Post-mobility: 97% SPO2   Set designer

## 2021-11-05 NOTE — Progress Notes (Signed)
Mobility Specialist - Progress Note   11/05/21 1427  Mobility  HOB Elevated/Bed Position Self regulated  Activity Ambulated with assistance in room (Outdoor arm exercises)  Range of Motion/Exercises Active;Right arm;Left arm  Level of Assistance Modified independent, requires aide device or extra time  [employer] wheel walker;Wheelchair  Distance Ambulated (ft) 25 ft  Activity Response Tolerated well  Transport method Ambulatory;Wheelchair  $Mobility charge 1 Mobility   Pt received in recliner and agreeable to mobility. Per pt request, we were able to wheelchair her down to go outside & do some theraband arm exercises. Pt did 3 different exercises w/ 10 reps on each arm making the session a total of 75mn. O2 stats remained stable at ~93-97%, dropped to 88% at EOS. Pt returned to room in time for lunch. Pt left in recliner with all needs met & call bell in reach.   [facility]

## 2021-11-05 NOTE — TOC Progression Note (Addendum)
Transition of Care [facility]) - Progression Note    Patient Details  Name: Felicia Warren MRN: [id number] Date of Birth: [birth year]  [facility]) CM/SW Contact  Anetta Olvera, Olegario Messier, RN Phone Number: 11/05/2021, 12:54 PM  Clinical Narrative: Noted patient lacks capacity for medical decisions per MD/Psych. Spoke to [employer]) about guardianship process (on speaker phone Kimber/Shanita)-answered all questions-they will work on gurardianship paperwork & they will f/u with [facility] csw. Per Arline Asp unsure of patient's financial status.Spoke to [place] [facility] csw will fax([phone]) info so they can continue to follow with [facility]. Arline Asp & her family agree to Group Home if appropriate. Noted no further 02, ambulating w/PT recc HHPT. Supv aware of current status. Continue to monitor.     Expected Discharge Plan: Home w Home Health Services Barriers to Discharge: Continued Medical Work up  Expected Discharge Plan and Services Expected Discharge Plan: Home w Home Health Services       Living arrangements for the past 2 months: Homeless                                       Social Determinants of Health (SDOH) Interventions Housing Interventions: Inpatient TOC  Readmission Risk Interventions     No data to display

## 2021-11-05 NOTE — Progress Notes (Signed)
PROGRESS NOTE  Felicia Warren WVP:[id number] DOB: 01-03-1971 DOA: 10/19/2021 PCP: Storm Frisk, MD   LOS: 17 days   Brief Narrative / Interim history: 51 year old female with morbid obesity, OHS/OSA, COPD, diastolic CHF, DM 2, presented to the hospital because EMS were called by the motel where she was living as she could not exit the room so they could clean it, and has soiled herself for several days.  She was disoriented in the ED, acidotic with a PCO2 of 80, acute kidney injury and was admitted to the hospital.  Significant events: 8/28: Admitted for COPD 8/29: Failed BiPAP and intubated 8/31: Extubated 9/2: Transferred out of the ICU, Psychiatry consulted, felt patient does not have capacity  9/7: Transitioned to oral diuretics  Significant imaging / results / micro data: CXR 8/28-no acute cardiopulmonary disease Urine culture 8/28-70,000 colonies lactobacillus COVID, RVP 8/28-negative  Subjective / 24h Interval events: Denies any shortness of breath, denies any chest pain.  Assesement and Plan: Principal Problem:   Acute on chronic respiratory failure with hypoxia and hypercapnia (HCC) Active Problems:   Obesity hypoventilation syndrome and obstructive sleep apnea   Acute on chronic diastolic CHF (congestive heart failure) (HCC)   Type 2 diabetes mellitus without complication (HCC)   Urinary tract infection without hematuria   Class 3 obesity (HCC)   History of pulmonary embolism   Self neglect and cognitive impairment   Hypokalemia and hypomagnesemia   Hypocalcemia   Principal problem Obesity hypoventilation syndrome and obstructive sleep apnea -Has chronic hypercarbic respiratory failure due to OSA/OHS/COPD/dCHF and presented in respiratory distress requiring BiPAP, acidotic, PCO2 80.  Give steroids and bronchodilators and IV fluids given AKI, but respiratory status deteriorated and she was intubated.  She was treated with steroids, bronchodilators, diuretics and  eventually extubated.  She was weaned off to 3 L nasal cannula, and has remained stable since.   Active problems Acute on chronic diastolic CHF (congestive heart failure) (HCC)-Echo shows EF 60 to 65%, LVH, no significant valvular disease.  She was diuresed, net -11 L, respiratory status has remained stable.  Continue oral furosemide   Type 2 diabetes mellitus without complication (HCC) -Glucose well controlled. Last hemoglobin A1c 6.2%.  Continue sliding scale CBG (last 3)  Recent Labs    11/04/21 2101 11/05/21 0559 11/05/21 0816  GLUCAP 108* 86 88     Urinary tract infection without hematuria -Completed course of antibiotics for UTI.   Class 3 obesity (HCC) -BMI 81.2   Hypocalcemia -Supplemented and resolved.   Hypokalemia and hypomagnesemia -Supplemented and resolved.   Self neglect and cognitive impairment -Earlier during this hospitalization, Psychiatry were consulted and agreed with attending that patient did not appear to have capacity to safely manage her complex cardiopulmonary disease.  APS were engaged and guardianship is being sought.  I agree with Dr. Sheryn Bison assessment 9/12, I have also approached and engage patient about discussions about her health conditions, she is unable to tell me what conditions she has except for the fact that they are "rare".  She has been persistently refusing Coreg, and when asked today she says "I do not need it".  She cannot elaborate as to why she does not need it or rationale for why she is refusing it.  She seems to have deficits in problem solving and executive function, she does not appear sufficiently safe to care for herself alone.  I agree with Dr. Sheryn Bison assessment   History of pulmonary embolism -Continue rivaroxaban  Scheduled Meds:  (feeding supplement)  PROSource Plus  30 mL Oral BID BM   carvedilol  3.125 mg Oral BID WC   furosemide  40 mg Oral BID   multivitamin with minerals  1 tablet Oral Daily   mouth rinse  15 mL Mouth  Rinse 4 times per day   potassium chloride  40 mEq Oral BID   Ensure Max Protein  11 oz Oral BID   rivaroxaban  10 mg Oral Daily   Continuous Infusions:  sodium chloride 10 mL/hr at 10/29/21 1307   PRN Meds:.sodium chloride, albuterol, diphenhydrAMINE-zinc acetate, docusate sodium, polyethylene glycol  Current Outpatient Medications  Medication Instructions   Multiple Vitamin (MULTIVITAMIN WITH MINERALS) TABS tablet 1 tablet, Oral, Daily   pantoprazole (PROTONIX) 40 mg, Oral, Daily at bedtime   torsemide (DEMADEX) 20 mg, Oral, Daily    Diet Orders (From admission, onward)     Start     Ordered   10/25/21 1448  Diet Carb Modified Fluid consistency: Thin; Room service appropriate? Yes; Fluid restriction: 1200 mL Fluid  Diet effective now       Question Answer Comment  Diet-HS Snack? Nothing   Calorie Level Medium 1600-2000   Fluid consistency: Thin   Room service appropriate? Yes   Fluid restriction: 1200 mL Fluid      10/25/21 1447            DVT prophylaxis: rivaroxaban (XARELTO) tablet 10 mg Start: 10/23/21 1300 SCDs Start: 10/19/21 2107 rivaroxaban (XARELTO) tablet 10 mg   Lab Results  Component Value Date   PLT 183 11/05/2021      Code Status: Full Code  Family Communication: no family at bedside   Status is: Inpatient  Remains inpatient appropriate because: placement  Level of care: Med-Surg  Consultants:  Psychiatry  PCCM  Objective: Vitals:   11/04/21 0900 11/04/21 1334 11/04/21 2023 11/05/21 0529  BP: 121/71  (!) 113/59 (!) 108/59  Pulse: 72  92 71  Resp: 19  18 20   Temp: 98.7 F (37.1 C)  97.9 F (36.6 C) 98.9 F (37.2 C)  TempSrc: Oral  Oral Oral  SpO2: 98% 96% 98% 100%  Weight:      Height:        Intake/Output Summary (Last 24 hours) at 11/05/2021 1218 Last data filed at 11/05/2021 0855 Gross per 24 hour  Intake 240 ml  Output --  Net 240 ml    Wt Readings from Last 3 Encounters:  11/04/21 (!) 196 kg  09/17/20 (!) 177.4 kg   09/08/20 (!) 176.5 kg    Examination:  Constitutional: NAD Eyes: lids and conjunctivae normal, no scleral icterus ENMT: mmm Neck: normal, supple Respiratory: clear to auscultation bilaterally, no wheezing, no crackles. Normal respiratory effort.  Cardiovascular: Regular rate and rhythm, no murmurs / rubs / gallops. No LE edema. Abdomen: soft, no distention, no tenderness. Bowel sounds positive.  Skin: no rashes Neurologic: no focal deficits, equal strength   Data Reviewed: I have independently reviewed following labs and imaging studies   CBC Recent Labs  Lab 11/05/21 0511  WBC 7.0  HGB 10.3*  HCT 37.2  PLT 183  MCV 81.0  MCH 22.4*  MCHC 27.7*  RDW 23.8*     Recent Labs  Lab 10/30/21 1236 11/01/21 0529 11/02/21 0453 11/03/21 0456 11/05/21 0511  NA 138 143 141  --  141  K 3.4* 3.0* 3.3* 3.6 4.0  CL 97* 99 99  --  103  CO2 34* 35* 34*  --  32  GLUCOSE  104* 84 86  --  87  BUN 9 7 6   --  7  CREATININE 0.82 0.67 0.67  --  0.70  CALCIUM 7.8* 7.6* 7.9*  --  8.6*  AST  --   --   --   --  18  ALT  --   --   --   --  17  ALKPHOS  --   --   --   --  62  BILITOT  --   --   --   --  1.1  ALBUMIN  --   --  2.9*  --  2.9*  MG 1.7 1.5* 1.7  --  1.8     ------------------------------------------------------------------------------------------------------------------ No results for input(s): "CHOL", "HDL", "LDLCALC", "TRIG", "CHOLHDL", "LDLDIRECT" in the last 72 hours.  Lab Results  Component Value Date   HGBA1C 6.2 (H) 06/18/2021   ------------------------------------------------------------------------------------------------------------------ No results for input(s): "TSH", "T4TOTAL", "T3FREE", "THYROIDAB" in the last 72 hours.  Invalid input(s): "FREET3"  Cardiac Enzymes No results for input(s): "CKMB", "TROPONINI", "MYOGLOBIN" in the last 168 hours.  Invalid input(s):  "CK" ------------------------------------------------------------------------------------------------------------------    Component Value Date/Time   BNP 778.8 (H) 10/19/2021 1837    CBG: Recent Labs  Lab 11/04/21 1225 11/04/21 1611 11/04/21 2101 11/05/21 0559 11/05/21 0816  GLUCAP 87 87 108* 86 88     No results found for this or any previous visit (from the past 240 hour(s)).   Radiology Studies: No results found.   11/07/21, MD, PhD [facility]  Between 7 am - 7 pm I am available, please contact me via Amion (for emergencies) or Securechat (non urgent messages)  Between 7 pm - 7 am I am not available, please contact night coverage MD/APP via Amion

## 2021-11-06 DIAGNOSIS — J9621 Acute and chronic respiratory failure with hypoxia: Secondary | ICD-10-CM | POA: Diagnosis not present

## 2021-11-06 DIAGNOSIS — J9622 Acute and chronic respiratory failure with hypercapnia: Secondary | ICD-10-CM | POA: Diagnosis not present

## 2021-11-06 LAB — COMPREHENSIVE METABOLIC PANEL
ALT: 15 U/L (ref 0–44)
AST: 18 U/L (ref 15–41)
Albumin: 2.9 g/dL — ABNORMAL LOW (ref 3.5–5.0)
Alkaline Phosphatase: 70 U/L (ref 38–126)
Anion gap: 6 (ref 5–15)
BUN: 9 mg/dL (ref 6–20)
CO2: 33 mmol/L — ABNORMAL HIGH (ref 22–32)
Calcium: 8.4 mg/dL — ABNORMAL LOW (ref 8.9–10.3)
Chloride: 101 mmol/L (ref 98–111)
Creatinine, Ser: 0.7 mg/dL (ref 0.44–1.00)
GFR, Estimated: >60 mL/min (ref >60)
Glucose, Bld: 89 mg/dL (ref 70–99)
Potassium: 3.9 mmol/L (ref 3.5–5.1)
Sodium: 140 mmol/L (ref 135–145)
Total Bilirubin: 0.9 mg/dL (ref 0.3–1.2)
Total Protein: 6.1 g/dL — ABNORMAL LOW (ref 6.5–8.1)

## 2021-11-06 LAB — GLUCOSE, CAPILLARY: Glucose-Capillary: 91 mg/dL (ref 70–99)

## 2021-11-06 LAB — MAGNESIUM: Magnesium: 1.6 mg/dL — ABNORMAL LOW (ref 1.7–2.4)

## 2021-11-06 LAB — PHOSPHORUS: Phosphorus: 5.1 mg/dL — ABNORMAL HIGH (ref 2.5–4.6)

## 2021-11-06 MED ORDER — MAGNESIUM SULFATE 2 GM/50ML IV SOLN
2.0000 g | Freq: Once | INTRAVENOUS | Status: AC
  Administered 2021-11-06: 2 g via INTRAVENOUS
  Filled 2021-11-06: qty 50

## 2021-11-06 NOTE — Progress Notes (Signed)
PROGRESS NOTE  Felicia Warren RJJ:[id number] DOB: February 07, 1971 DOA: 10/19/2021 PCP: Storm Frisk, MD   LOS: 18 days   Brief Narrative / Interim history: 51 year old female with morbid obesity, OHS/OSA, COPD, diastolic CHF, DM 2, presented to the hospital because EMS were called by the motel where she was living as she could not exit the room so they could clean it, and has soiled herself for several days.  She was disoriented in the ED, acidotic with a PCO2 of 80, acute kidney injury and was admitted to the hospital.  Significant events: 8/28: Admitted for COPD 8/29: Failed BiPAP and intubated 8/31: Extubated 9/2: Transferred out of the ICU, Psychiatry consulted, felt patient does not have capacity  9/7: Transitioned to oral diuretics 9/15: Stable, awaiting placement  Significant imaging / results / micro data: CXR 8/28-no acute cardiopulmonary disease Urine culture 8/28-70,000 colonies lactobacillus COVID, RVP 8/28-negative  Subjective / 24h Interval events: Feels well, no complaints  Assesement and Plan: Principal Problem:   Acute on chronic respiratory failure with hypoxia and hypercapnia (HCC) Active Problems:   Obesity hypoventilation syndrome and obstructive sleep apnea   Acute on chronic diastolic CHF (congestive heart failure) (HCC)   Type 2 diabetes mellitus without complication (HCC)   Urinary tract infection without hematuria   Class 3 obesity (HCC)   History of pulmonary embolism   Self neglect and cognitive impairment   Hypokalemia and hypomagnesemia   Hypocalcemia   Principal problem Obesity hypoventilation syndrome and obstructive sleep apnea -Has chronic hypercarbic respiratory failure due to OSA/OHS/COPD/dCHF and presented in respiratory distress requiring BiPAP, acidotic, PCO2 80, likely in the setting of nonadherence to nightly CPAP per patient.  Give steroids and bronchodilators and IV fluids given AKI, but respiratory status deteriorated and she was  intubated.  She was treated with steroids, bronchodilators, diuretics and eventually extubated.  She eventually came off oxygen and currently she is stable on room air.   Active problems Acute on chronic diastolic CHF (congestive heart failure) (HCC)-Echo shows EF 60 to 65%, LVH, no significant valvular disease.  She was diuresed, respiratory status improved, currently appears euvolemic.  Continue oral furosemide   Type 2 diabetes mellitus without complication (HCC) -Glucose well controlled. Last hemoglobin A1c 6.2%.  Continue sliding scale  CBG (last 3)  Recent Labs    11/05/21 0816 11/05/21 2129 11/06/21 0210  GLUCAP 88 103* 91     Urinary tract infection without hematuria -Completed course of antibiotics for UTI.   Class 3 obesity (HCC) -BMI 81.2   Hypocalcemia -Supplemented and resolved.   Hypokalemia and hypomagnesemia -Supplemented and resolved.   Self neglect and cognitive impairment -Earlier during this hospitalization, Psychiatry were consulted and agreed with attending that patient did not appear to have capacity to safely manage her complex cardiopulmonary disease.  APS were engaged and guardianship is being sought.  I agree with Dr. Sheryn Bison assessment 9/12, I have also approached and engage patient about discussions about her health conditions, she is unable to tell me what conditions she has except for the fact that they are "rare".  She has been persistently refusing Coreg, and when asked today she says "I do not need it".  She cannot elaborate as to why she does not need it or rationale for why she is refusing it.  She seems to have deficits in problem solving and executive function, she does not appear sufficiently safe to care for herself alone.  I agree with Dr. Sheryn Bison assessment   History of pulmonary embolism -  Continue rivaroxaban  Scheduled Meds:  (feeding supplement) PROSource Plus  30 mL Oral BID BM   carvedilol  3.125 mg Oral BID WC   furosemide  40 mg Oral  BID   multivitamin with minerals  1 tablet Oral Daily   mouth rinse  15 mL Mouth Rinse 4 times per day   potassium chloride  40 mEq Oral BID   Ensure Max Protein  11 oz Oral BID   rivaroxaban  10 mg Oral Daily   Continuous Infusions:  sodium chloride 10 mL/hr at 11/06/21 0300   magnesium sulfate bolus IVPB     PRN Meds:.sodium chloride, albuterol, diphenhydrAMINE-zinc acetate, docusate sodium, mouth rinse, polyethylene glycol  Current Outpatient Medications  Medication Instructions   Multiple Vitamin (MULTIVITAMIN WITH MINERALS) TABS tablet 1 tablet, Oral, Daily   pantoprazole (PROTONIX) 40 mg, Oral, Daily at bedtime   torsemide (DEMADEX) 20 mg, Oral, Daily    Diet Orders (From admission, onward)     Start     Ordered   10/25/21 1448  Diet Carb Modified Fluid consistency: Thin; Room service appropriate? Yes; Fluid restriction: 1200 mL Fluid  Diet effective now       Question Answer Comment  Diet-HS Snack? Nothing   Calorie Level Medium 1600-2000   Fluid consistency: Thin   Room service appropriate? Yes   Fluid restriction: 1200 mL Fluid      10/25/21 1447            DVT prophylaxis: rivaroxaban (XARELTO) tablet 10 mg Start: 10/23/21 1300 SCDs Start: 10/19/21 2107 rivaroxaban (XARELTO) tablet 10 mg   Lab Results  Component Value Date   PLT 183 11/05/2021      Code Status: Full Code  Family Communication: no family at bedside   Status is: Inpatient  Remains inpatient appropriate because: placement  Level of care: Med-Surg  Consultants:  Psychiatry  PCCM  Objective: Vitals:   11/06/21 0447 11/06/21 0543 11/06/21 0750 11/06/21 1245  BP:  135/66  (!) 120/57  Pulse:  64 (!) 55 74  Resp:  20 18 20   Temp:  98.4 F (36.9 C)  98.2 F (36.8 C)  TempSrc:    Oral  SpO2:  100% 96% 97%  Weight: (!) 188 kg     Height:        Intake/Output Summary (Last 24 hours) at 11/06/2021 1311 Last data filed at 11/06/2021 1245 Gross per 24 hour  Intake 1230 ml   Output 1000 ml  Net 230 ml    Wt Readings from Last 3 Encounters:  11/06/21 (!) 188 kg  09/17/20 (!) 177.4 kg  09/08/20 (!) 176.5 kg    Examination:  Constitutional: NAD Eyes: lids and conjunctivae normal, no scleral icterus ENMT: mmm Neck: normal, supple Respiratory: clear to auscultation bilaterally, no wheezing, no crackles. Normal respiratory effort.  Cardiovascular: Regular rate and rhythm, no murmurs / rubs / gallops.  Trace LE edema. Abdomen: soft, no distention, no tenderness. Bowel sounds positive.  Skin: no rashes Neurologic: no focal deficits, equal strength   Data Reviewed: I have independently reviewed following labs and imaging studies   CBC Recent Labs  Lab 11/05/21 0511  WBC 7.0  HGB 10.3*  HCT 37.2  PLT 183  MCV 81.0  MCH 22.4*  MCHC 27.7*  RDW 23.8*     Recent Labs  Lab 11/01/21 0529 11/02/21 0453 11/03/21 0456 11/05/21 0511 11/06/21 0442  NA 143 141  --  141 140  K 3.0* 3.3* 3.6 4.0 3.9  CL 99 99  --  103 101  CO2 35* 34*  --  32 33*  GLUCOSE 84 86  --  87 89  BUN 7 6  --  7 9  CREATININE 0.67 0.67  --  0.70 0.70  CALCIUM 7.6* 7.9*  --  8.6* 8.4*  AST  --   --   --  18 18  ALT  --   --   --  17 15  ALKPHOS  --   --   --  62 70  BILITOT  --   --   --  1.1 0.9  ALBUMIN  --  2.9*  --  2.9* 2.9*  MG 1.5* 1.7  --  1.8 1.6*     ------------------------------------------------------------------------------------------------------------------ No results for input(s): "CHOL", "HDL", "LDLCALC", "TRIG", "CHOLHDL", "LDLDIRECT" in the last 72 hours.  Lab Results  Component Value Date   HGBA1C 6.2 (H) 06/18/2021   ------------------------------------------------------------------------------------------------------------------ No results for input(s): "TSH", "T4TOTAL", "T3FREE", "THYROIDAB" in the last 72 hours.  Invalid input(s): "FREET3"  Cardiac Enzymes No results for input(s): "CKMB", "TROPONINI", "MYOGLOBIN" in the last 168  hours.  Invalid input(s): "CK" ------------------------------------------------------------------------------------------------------------------    Component Value Date/Time   BNP 778.8 (H) 10/19/2021 1837    CBG: Recent Labs  Lab 11/04/21 2101 11/05/21 0559 11/05/21 0816 11/05/21 2129 11/06/21 0210  GLUCAP 108* 86 88 103* 91     No results found for this or any previous visit (from the past 240 hour(s)).   Radiology Studies: No results found.   Marzetta Board, MD, PhD [facility]  Between 7 am - 7 pm I am available, please contact me via Amion (for emergencies) or Securechat (non urgent messages)  Between 7 pm - 7 am I am not available, please contact night coverage MD/APP via Amion

## 2021-11-06 NOTE — Progress Notes (Signed)
Patient taken off Bipap and placed on room air. No increased WOB or SOB at this time. RT will continue to follow.

## 2021-11-06 NOTE — NC FL2 (Signed)
[place] MEDICAID FL2 LEVEL OF CARE SCREENING TOOL     IDENTIFICATION  Patient Name: Felicia Warren Birthdate: 07-Jul-1970 Sex: female Admission Date (Current Location): 10/19/2021  [facility] and [place] Number:  Producer, television/film/video and Address:  [facility],  [address], [place] [address]      Provider Number: [id number]  Attending Physician Name and Address:  Leatha Gilding, MD  Relative Name and Phone Number:  Migdalia Dk, [phone]    Current Level of Care: Hospital Recommended Level of Care: [facility] (Group home) Prior Approval Number:    Date Approved/Denied:   PASRR Number: [id number]  Discharge Plan: Other (Comment) (group home/Family care home)    Current Diagnoses: Patient Active Problem List   Diagnosis Date Noted   Obesity hypoventilation syndrome and obstructive sleep apnea 11/01/2021   Hypocalcemia 11/01/2021   Class 3 obesity (HCC) 10/24/2021   Acute on chronic diastolic CHF (congestive heart failure) (HCC)    Type 2 diabetes mellitus without complication (HCC)    Urinary tract infection without hematuria    Hypokalemia and hypomagnesemia 06/20/2021   Hypomagnesemia 06/20/2021   Physical deconditioning 06/18/2021   Iron deficiency anemia 06/18/2021   Chronic diastolic CHF (congestive heart failure) (HCC) 06/18/2021   Involuntary commitment 06/17/2021   Self neglect and cognitive impairment 06/17/2021   OSA/OHS on CPAP 06/17/2021   Acute respiratory failure with hypoxia (HCC) 06/16/2021   Acute on chronic respiratory failure with hypoxia and hypercapnia (HCC) 06/16/2021   BMI 60.0-69.9, adult (HCC) 09/17/2020   Prediabetes 09/17/2020   Hypokalemia    Acute metabolic encephalopathy 07/05/2020   History of pulmonary embolism 07/05/2020    Orientation RESPIRATION BLADDER Height & Weight     Self, Time, Situation, Place  O2 (CPAP at night) Continent Weight: (!) 414 lb 7.4 oz (188 kg) Height:  5\' 1"  (154.9 cm)   BEHAVIORAL SYMPTOMS/MOOD NEUROLOGICAL BOWEL NUTRITION STATUS     (n/a) Continent Diet (see dc summary)  AMBULATORY STATUS COMMUNICATION OF NEEDS Skin   Supervision Verbally Skin abrasions                       Personal Care Assistance Level of Assistance  Dressing Bathing Assistance: Limited assistance Feeding assistance: Independent Dressing Assistance: Limited assistance     Functional Limitations Info  Sight, Hearing, Speech Sight Info: Adequate Hearing Info: Adequate Speech Info: Adequate    SPECIAL CARE FACTORS FREQUENCY   (no follow up)     PT Frequency: 5 x week OT Frequency: 5 x week            Contractures Contractures Info: Not present    Additional Factors Info  Insulin Sliding Scale Code Status Info: full Allergies Info: NKA Psychotropic Info: none Insulin Sliding Scale Info:  (MAR)       Current Medications (11/06/2021):  This is the current hospital active medication list Current Facility-Administered Medications  Medication Dose Route Frequency Provider Last Rate Last Admin   (feeding supplement) PROSource Plus liquid 30 mL  30 mL Oral BID BM 11/08/2021, DO   30 mL at 10/27/21 1451   0.9 %  sodium chloride infusion   Intravenous PRN 12/27/21, MD 10 mL/hr at 11/06/21 0300 Infusion Verify at 11/06/21 0300   albuterol (PROVENTIL) (2.5 MG/3ML) 0.083% nebulizer solution 2.5 mg  2.5 mg Nebulization Q4H PRN 11/08/21, DO   2.5 mg at 10/20/21 0247   carvedilol (COREG) tablet 3.125 mg  3.125 mg Oral  BID WC Arrien, York Ram, MD   3.125 mg at 10/30/21 0845   diphenhydrAMINE-zinc acetate (BENADRYL) 2-0.1 % cream   Topical BID PRN Duayne Cal, NP   Given at 10/22/21 1457   docusate sodium (COLACE) capsule 100 mg  100 mg Oral BID PRN Briant Sites, DO       furosemide (LASIX) tablet 40 mg  40 mg Oral BID Hughie Closs, MD   40 mg at 11/06/21 7510   multivitamin with minerals tablet 1 tablet  1 tablet Oral Daily Briant Sites, DO   1 tablet at 11/06/21 2585   Oral care mouth rinse  15 mL Mouth Rinse 4 times per day Luiz Iron, NP   15 mL at 11/06/21 0841   Oral care mouth rinse  15 mL Mouth Rinse PRN Luiz Iron, NP       polyethylene glycol (MIRALAX / GLYCOLAX) packet 17 g  17 g Oral Daily PRN Briant Sites, DO       potassium chloride SA (KLOR-CON M) CR tablet 40 mEq  40 mEq Oral BID Alberteen Sam, MD   40 mEq at 11/06/21 0838   protein supplement (ENSURE MAX) liquid  11 oz Oral BID Briant Sites, DO   11 oz at 11/06/21 2778   rivaroxaban (XARELTO) tablet 10 mg  10 mg Oral Daily Oretha Milch, MD   10 mg at 11/06/21 2423     Discharge Medications: Please see discharge summary for a list of discharge medications.  Relevant Imaging Results:  Relevant Lab Results:   Additional Information [id number]  Coralyn Helling, LCSW

## 2021-11-06 NOTE — TOC Progression Note (Signed)
Transition of Care [facility]) - Progression Note    Patient Details  Name: Felicia Warren MRN: [id number] Date of Birth: Jun 08, 1970  [facility]) CM/SW Contact  Coralyn Helling, [place] Phone Number: 11/06/2021, 10:48 AM  Clinical Narrative:    Angelica Pou to Malon Kindle at group home for review per request of Sharol Roussel.   Sister Arline Asp reports she has completed the guardianship paperwork, but has a few questions for the the case worker before submitting. Sister has CPAP machine for patient.    Expected Discharge Plan: Home w Home Health Services Barriers to Discharge: Continued Medical Work up  Expected Discharge Plan and Services Expected Discharge Plan: Home w Home Health Services       Living arrangements for the past 2 months: Homeless                                       Social Determinants of Health (SDOH) Interventions Housing Interventions: Inpatient TOC  Readmission Risk Interventions     No data to display

## 2021-11-07 DIAGNOSIS — J9621 Acute and chronic respiratory failure with hypoxia: Secondary | ICD-10-CM | POA: Diagnosis not present

## 2021-11-07 DIAGNOSIS — J9602 Acute respiratory failure with hypercapnia: Secondary | ICD-10-CM | POA: Diagnosis not present

## 2021-11-07 DIAGNOSIS — I5033 Acute on chronic diastolic (congestive) heart failure: Secondary | ICD-10-CM | POA: Diagnosis not present

## 2021-11-07 NOTE — Progress Notes (Signed)
Pt tolerated Bipap throughout the night, RN offered to reposition pt and pt refused and just needed her leg adjusted and rail pulled up and pt comfortable. Pt ambulates with assistance In the room with front wheel walker.

## 2021-11-07 NOTE — Progress Notes (Signed)
Mobility Specialist - Progress Note   11/07/21 1436  Mobility  HOB Elevated/Bed Position Self regulated  Activity Ambulated with assistance in hallway (Outdoor exercises)  Range of Motion/Exercises Active;Left arm;Right arm  Level of Assistance Modified independent, requires aide device or extra time  Assistive Device Front wheel walker;Wheelchair  Distance Ambulated (ft) 125 ft  Activity Response Tolerated well  Transport method Ambulatory  $Mobility charge 1 Mobility   Pt received in recliner and agreeable to mobility. Took pt outside to do some arm exercises. Pt did 3 different arm exercises with 5 reps on each arm. Upon returning to room pt wanted to walk down the hall. Pt to bed after session with all needs met.    Maya Johnson Mobility Specialist  

## 2021-11-07 NOTE — Progress Notes (Addendum)
[facility]                                                                              Felicia Warren, is a 51 y.o. female, DOB - [birth year], [id number] Admit date - 10/19/2021    Outpatient Primary MD for the patient is Storm Frisk, MD  LOS - 19  days  Chief Complaint  Patient presents with   Altered Mental Status       Brief summary   51 year old female with morbid obesity, OHS/OSA, COPD, diastolic CHF, DM 2, presented to the hospital because EMS were called by the motel where she was living as she could not exit the room so they could clean it, and has soiled herself for several days.  She was disoriented in the ED, acidotic with a PCO2 of 80, acute kidney injury and was admitted to the hospital.   Significant events: 8/28: Admitted for COPD 8/29: Failed BiPAP and intubated 8/31: Extubated 9/2: Transferred out of the ICU, Psychiatry consulted, felt patient does not have capacity  9/7: Transitioned to oral diuretics 9/15: Stable, awaiting placement   Assessment & Plan    Principal Problem:   Acute on chronic respiratory failure with hypoxia and hypercapnia (HCC) Multifactorial likely due to OHS, obstructive sleep apnea, COPD, diastolic CHF -Weaned off of O2, currently on room air   Active Problems:   Obesity hypoventilation syndrome and obstructive sleep apnea -Has chronic hypercarbic respiratory failure, presented in respiratory distress requiring BiPAP, PCO2 ED likely in the setting of nonadherence to nightly CPAP as outpatient. -Patient was placed on steroids, bronchodilators, was intubated on 8/29, extubated on 8/31.  Was placed on diuretics. -Currently stable, on room air    Acute on chronic diastolic CHF (congestive heart failure) (HCC) -2D echo showed EF 60 to 65%, mild LVH, no significant valvular disease -Appears euvolemic, continue Lasix 40 mg twice daily -Negative balance of 8.7 L, weight 450 LB's on admission 8/29, improved to 414  today  -Refuses Coreg, counseled on compliance    Type 2 diabetes mellitus without complication (HCC) -Diet controlled, A1c 6.2 -CBG stable Recent Labs    11/04/21 1611 11/04/21 2101 11/05/21 0559 11/05/21 0816 11/05/21 2129 11/06/21 0210  GLUCAP 87 108* 86 88 103* 91      Urinary tract infection without hematuria  --Completed course of antibiotics for UTI.     Hypocalcemia  -- Resolved   Hypokalemia and hypomagnesemia  -- resolved   Self neglect and cognitive impairment  -Psychiatry was consulted, patient does not have capacity for medical decisions, does not have capacity to leave AMA (see note on 9/2) -refusing Coreg, explained rationale for medication, hopefully will agree -Currently APS engaged and guardianship is being pursued by sister. -TOC assisting with group home    History of pulmonary embolism -Continue Xarelto  Morbid obesity Estimated body mass index is 78.31 kg/m as calculated from the following:   Height as of this encounter: 5\' 1"  (1.549 m).   Weight as of this encounter: 188 kg.  Code Status: Full CODE STATUS medically stable, DVT Prophylaxis:  rivaroxaban (XARELTO) tablet 10 mg Start: 10/23/21 1300 SCDs Start:  10/19/21 2107 rivaroxaban (XARELTO) tablet 10 mg   Level of Care: Level of care: Med-Surg Family Communication: Updated patient'   Disposition Plan:      Remains inpatient appropriate: Medically stable, awaiting placement  Procedures:  2D echo  Consultants:   Psychiatry Pulmonary critical care  Antimicrobials:   Anti-infectives (From admission, onward)    Start     Dose/Rate Route Frequency Ordered Stop   10/22/21 2100  cefTRIAXone (ROCEPHIN) 1 g in sodium chloride 0.9 % 100 mL IVPB        1 g 200 mL/hr over 30 Minutes Intravenous Every 24 hours 10/22/21 0748 10/22/21 2203   10/20/21 2100  cefTRIAXone (ROCEPHIN) 1 g in sodium chloride 0.9 % 100 mL IVPB  Status:  Discontinued        1 g 200 mL/hr over 30 Minutes  Intravenous Every 24 hours 10/19/21 2118 10/22/21 0748   10/19/21 2100  cefTRIAXone (ROCEPHIN) 2 g in sodium chloride 0.9 % 100 mL IVPB        2 g 200 mL/hr over 30 Minutes Intravenous  Once 10/19/21 2046 10/19/21 2238          Medications  (feeding supplement) PROSource Plus  30 mL Oral BID BM   carvedilol  3.125 mg Oral BID WC   furosemide  40 mg Oral BID   multivitamin with minerals  1 tablet Oral Daily   mouth rinse  15 mL Mouth Rinse 4 times per day   potassium chloride  40 mEq Oral BID   Ensure Max Protein  11 oz Oral BID   rivaroxaban  10 mg Oral Daily      Subjective:   Felicia Warren was seen and examined today.  No acute issues overnight.  Awaiting placement.   Objective:   Vitals:   11/06/21 2015 11/06/21 2218 11/07/21 0314 11/07/21 0450  BP: 131/63   (!) 130/58  Pulse: 84 84 84 73  Resp: (!) 24 (!) 23 (!) 22 16  Temp: 99.2 F (37.3 C)   98.5 F (36.9 C)  TempSrc: Oral   Oral  SpO2: 95% 94% 95% 98%  Weight:    (!) 188 kg  Height:        Intake/Output Summary (Last 24 hours) at 11/07/2021 1133 Last data filed at 11/07/2021 1028 Gross per 24 hour  Intake 1000.89 ml  Output 1700 ml  Net -699.11 ml     Wt Readings from Last 3 Encounters:  11/07/21 (!) 188 kg  09/17/20 (!) 177.4 kg  09/08/20 (!) 176.5 kg     Exam General: Alert and oriented x 3, NAD Cardiovascular: S1 S2 auscultated,  RRR Respiratory: Clear to auscultation bilaterally, no wheezing, rales Gastrointestinal: Obese, soft, nontender, nondistended, + bowel sounds Ext: no pedal edema bilaterally Neuro: Strength 5/5 upper and lower extremities bilaterally Psych: alert and oriented x3     Data Reviewed:  I have personally reviewed following labs    CBC Lab Results  Component Value Date   WBC 7.0 11/05/2021   RBC 4.59 11/05/2021   HGB 10.3 (L) 11/05/2021   HCT 37.2 11/05/2021   MCV 81.0 11/05/2021   MCH 22.4 (L) 11/05/2021   PLT 183 11/05/2021   MCHC 27.7 (L) 11/05/2021    RDW 23.8 (H) 11/05/2021   LYMPHSABS 0.8 10/29/2021   MONOABS 0.5 10/29/2021   EOSABS 0.4 10/29/2021   BASOSABS 0.0 10/29/2021     Last metabolic panel Lab Results  Component Value Date   NA 140 11/06/2021  K 3.9 11/06/2021   CL 101 11/06/2021   CO2 33 (H) 11/06/2021   BUN 9 11/06/2021   CREATININE 0.70 11/06/2021   GLUCOSE 89 11/06/2021   GFRNONAA >60 11/06/2021   CALCIUM 8.4 (L) 11/06/2021   PHOS 5.1 (H) 11/06/2021   PROT 6.1 (L) 11/06/2021   ALBUMIN 2.9 (L) 11/06/2021   BILITOT 0.9 11/06/2021   ALKPHOS 70 11/06/2021   AST 18 11/06/2021   ALT 15 11/06/2021   ANIONGAP 6 11/06/2021    CBG (last 3)  Recent Labs    11/05/21 0816 11/05/21 2129 11/06/21 0210  GLUCAP 88 103* 91        Felicie Kocher M.D. [facility] 11/07/2021, 11:33 AM  Available via Epic secure chat 7am-7pm After 7 pm, please refer to night coverage provider listed on amion.

## 2021-11-07 NOTE — Progress Notes (Signed)
Mobility Specialist - Progress Note   11/07/21 0941  Mobility  HOB Elevated/Bed Position Self regulated  Activity Ambulated with assistance in hallway;Ambulated with assistance to bathroom  Range of Motion/Exercises Active  Level of Assistance Standby assist, set-up cues, supervision of patient - no hands on  Assistive Device Front wheel walker  Distance Ambulated (ft) 250 ft  Activity Response Tolerated well  Transport method Ambulatory  $Mobility charge 1 Mobility   Pt received in bed and agreeable to mobility. No SOB during ambulation session. Pt requested assistance w/ bathroom ADLs. Pt to recliner at EOS with breakfast on tray & all necessities in reach.   [facility] [place]

## 2021-11-08 DIAGNOSIS — J9621 Acute and chronic respiratory failure with hypoxia: Secondary | ICD-10-CM | POA: Diagnosis not present

## 2021-11-08 DIAGNOSIS — J9602 Acute respiratory failure with hypercapnia: Secondary | ICD-10-CM | POA: Diagnosis not present

## 2021-11-08 DIAGNOSIS — I5033 Acute on chronic diastolic (congestive) heart failure: Secondary | ICD-10-CM | POA: Diagnosis not present

## 2021-11-08 LAB — BASIC METABOLIC PANEL
Anion gap: 7 (ref 5–15)
BUN: 11 mg/dL (ref 6–20)
CO2: 31 mmol/L (ref 22–32)
Calcium: 8.4 mg/dL — ABNORMAL LOW (ref 8.9–10.3)
Chloride: 100 mmol/L (ref 98–111)
Creatinine, Ser: 0.65 mg/dL (ref 0.44–1.00)
GFR, Estimated: >60 mL/min (ref >60)
Glucose, Bld: 97 mg/dL (ref 70–99)
Potassium: 3.9 mmol/L (ref 3.5–5.1)
Sodium: 138 mmol/L (ref 135–145)

## 2021-11-08 LAB — MAGNESIUM: Magnesium: 2 mg/dL (ref 1.7–2.4)

## 2021-11-08 NOTE — Progress Notes (Signed)
[facility]                                                                              [name], is a 51 y.o. female, DOB - [birth year], JYN:[id number] Admit date - 10/19/2021    Outpatient Primary MD for the patient is Elsie Stain, MD  LOS - 20  days  Chief Complaint  Patient presents with   Altered Mental Status       Brief summary   51 year old female with morbid obesity, OHS/OSA, COPD, diastolic CHF, DM 2, presented to the hospital because EMS were called by the [facility] where she was living as she could not exit the room so they could clean it, and has soiled herself for several days.  She was disoriented in the ED, acidotic with a PCO2 of 80, acute kidney injury and was admitted to the hospital.   Significant events: 8/28: Admitted for COPD 8/29: Failed BiPAP and intubated 8/31: Extubated 9/2: Transferred out of the ICU, Psychiatry consulted, felt patient does not have capacity  9/7: Transitioned to oral diuretics 9/15: Stable, awaiting placement   Assessment & Plan    Principal Problem:   Acute on chronic respiratory failure with hypoxia and hypercapnia ([place]) - Multifactorial likely due to OHS, obstructive sleep apnea, COPD, diastolic CHF -Weaned off of O2, currently on room air -No acute issues, awaiting placement   Active Problems:   Obesity hypoventilation syndrome and obstructive sleep apnea -Has chronic hypercarbic respiratory failure, presented in respiratory distress requiring BiPAP, PCO2 ED likely in the setting of nonadherence to nightly CPAP as outpatient. -Patient was placed on steroids, bronchodilators, was intubated on 8/29, extubated on 8/31.  Was placed on diuretics. -Currently stable, on room air -No acute issues, awaiting placement    Acute on chronic diastolic CHF (congestive heart failure) (HCC) -2D echo showed EF 60 to 65%, mild LVH, no significant valvular disease -Appears euvolemic, continue Lasix 40 mg twice daily,  Coreg -Negative balance of 7.8 L. weight 450 LB's on admission 8/29, improved to 410 lbs today      Type 2 diabetes mellitus without complication (HCC) -Diet controlled, A1c 6.2 -CBG's stable    Urinary tract infection without hematuria  --Completed course of antibiotics for UTI.    Hypocalcemia  -- Resolved   Hypokalemia and hypomagnesemia  -- resolved   Self neglect and cognitive impairment  -Psychiatry was consulted, patient does not have capacity for medical decisions, does not have capacity to leave AMA (see note on 9/2) -Currently APS engaged and guardianship is being pursued by sister. -TOC assisting with group home    History of pulmonary embolism -Continue Xarelto  Morbid obesity Estimated body mass index is 77.48 kg/m as calculated from the following:   Height as of this encounter: 5\' 1"  (1.549 m).   Weight as of this encounter: 186 kg.  Code Status: Full CODE STATUS medically stable, DVT Prophylaxis:  rivaroxaban (XARELTO) tablet 10 mg Start: 10/23/21 1300 SCDs Start: 10/19/21 2107 rivaroxaban (XARELTO) tablet 10 mg   Level of Care: Level of care: Med-Surg Family Communication: Updated patient'   Disposition Plan:  Remains inpatient appropriate: Medically stable, awaiting placement  Procedures:  2D echo  Consultants:   Psychiatry Pulmonary critical care  Antimicrobials:   Anti-infectives (From admission, onward)    Start     Dose/Rate Route Frequency Ordered Stop   10/22/21 2100  cefTRIAXone (ROCEPHIN) 1 g in sodium chloride 0.9 % 100 mL IVPB        1 g 200 mL/hr over 30 Minutes Intravenous Every 24 hours 10/22/21 0748 10/22/21 2203   10/20/21 2100  cefTRIAXone (ROCEPHIN) 1 g in sodium chloride 0.9 % 100 mL IVPB  Status:  Discontinued        1 g 200 mL/hr over 30 Minutes Intravenous Every 24 hours 10/19/21 2118 10/22/21 0748   10/19/21 2100  cefTRIAXone (ROCEPHIN) 2 g in sodium chloride 0.9 % 100 mL IVPB        2 g 200 mL/hr over 30  Minutes Intravenous  Once 10/19/21 2046 10/19/21 2238          Medications  (feeding supplement) PROSource Plus  30 mL Oral BID BM   carvedilol  3.125 mg Oral BID WC   furosemide  40 mg Oral BID   multivitamin with minerals  1 tablet Oral Daily   mouth rinse  15 mL Mouth Rinse 4 times per day   potassium chloride  40 mEq Oral BID   Ensure Max Protein  11 oz Oral BID   rivaroxaban  10 mg Oral Daily      Subjective:   Felicia Warren was seen and examined today.  No acute issues, ambulating with a walker, awaiting placement.  No issues overnight  Objective:   Vitals:   11/07/21 2044 11/07/21 2249 11/08/21 0304 11/08/21 0646  BP: (!) 114/59   126/63  Pulse: 93 85 85 70  Resp: 18 (!) 24 (!) 26 20  Temp: 98.9 F (37.2 C)   97.9 F (36.6 C)  TempSrc: Oral   Oral  SpO2: 90% 96% 96% 100%  Weight:    (!) 186 kg  Height:        Intake/Output Summary (Last 24 hours) at 11/08/2021 1155 Last data filed at 11/08/2021 0930 Gross per 24 hour  Intake 567 ml  Output 1900 ml  Net -1333 ml     Wt Readings from Last 3 Encounters:  11/08/21 (!) 186 kg  09/17/20 (!) 177.4 kg  09/08/20 (!) 176.5 kg    Physical Exam General: Alert and oriented x 3, NAD Cardiovascular: S1 S2 clear, RRR.  Respiratory: CTAB Gastrointestinal: Soft, NT, ND Ext: no pedal edema bilaterally Neuro: no new deficits    Data Reviewed:  I have personally reviewed following labs    CBC Lab Results  Component Value Date   WBC 7.0 11/05/2021   RBC 4.59 11/05/2021   HGB 10.3 (L) 11/05/2021   HCT 37.2 11/05/2021   MCV 81.0 11/05/2021   MCH 22.4 (L) 11/05/2021   PLT 183 11/05/2021   MCHC 27.7 (L) 11/05/2021   RDW 23.8 (H) 11/05/2021   LYMPHSABS 0.8 10/29/2021   MONOABS 0.5 10/29/2021   EOSABS 0.4 10/29/2021   BASOSABS 0.0 [id number]     Last metabolic panel Lab Results  Component Value Date   NA 138 11/08/2021   K 3.9 11/08/2021   CL 100 11/08/2021   CO2 31 11/08/2021   BUN 11  11/08/2021   CREATININE 0.65 11/08/2021   GLUCOSE 97 11/08/2021   GFRNONAA >60 11/08/2021   CALCIUM 8.4 (L) 11/08/2021   PHOS 5.1 (H)  11/06/2021   PROT 6.1 (L) 11/06/2021   ALBUMIN 2.9 (L) 11/06/2021   BILITOT 0.9 11/06/2021   ALKPHOS 70 11/06/2021   AST 18 11/06/2021   ALT 15 11/06/2021   ANIONGAP 7 11/08/2021    CBG (last 3)  Recent Labs    11/05/21 2129 11/06/21 0210  GLUCAP 64* 91        Kaylena Pacifico M.D. [facility] 11/08/2021, 11:55 AM  Available via Epic secure chat 7am-7pm After 7 pm, please refer to night coverage provider listed on amion.

## 2021-11-09 DIAGNOSIS — J9621 Acute and chronic respiratory failure with hypoxia: Secondary | ICD-10-CM | POA: Diagnosis not present

## 2021-11-09 DIAGNOSIS — J9622 Acute and chronic respiratory failure with hypercapnia: Secondary | ICD-10-CM | POA: Diagnosis not present

## 2021-11-09 NOTE — TOC Progression Note (Signed)
Transition of Care [facility]) - Progression Note    Patient Details  Name: Felicia Warren MRN: [id number] Date of Birth: 08-Oct-1970  Transition of Care [facility]) CM/SW Contact  Joaquin Courts, RN Phone Number: 11/09/2021, 4:31 PM  Clinical Narrative:    Follow up call placed to [facility], VM left requesting call back.   Expected Discharge Plan: [place] Barriers to Discharge: Continued Medical Work up  Expected Discharge Plan and Services Expected Discharge Plan: [place] arrangements for the past 2 months: Homeless                                       Social Determinants of Health (SDOH) Interventions Housing Interventions: Inpatient TOC  Readmission Risk Interventions     No data to display

## 2021-11-09 NOTE — Progress Notes (Signed)
PROGRESS NOTE  Felicia Warren WEX:[id number] DOB: May 13, 1970 DOA: 10/19/2021 PCP: Storm Frisk, MD   LOS: 21 days   Brief Narrative / Interim history: 51 year old female with morbid obesity, OHS/OSA, COPD, diastolic CHF, DM 2, presented to the hospital because EMS were called by the motel where she was living as she could not exit the room so they could clean it, and has soiled herself for several days.  She was disoriented in the ED, acidotic with a PCO2 of 80, acute kidney injury and was admitted to the hospital.  Significant events: 8/28: Admitted for COPD 8/29: Failed BiPAP and intubated 8/31: Extubated 9/2: Transferred out of the ICU, Psychiatry consulted, felt patient does not have capacity  9/7: Transitioned to oral diuretics 9/15: Stable, awaiting placement  Significant imaging / results / micro data: CXR 8/28-no acute cardiopulmonary disease Urine culture 8/28-70,000 colonies lactobacillus COVID, RVP 8/28-negative  Subjective / 24h Interval events: Feels well, no complaints  Assesement and Plan: Principal Problem:   Acute on chronic respiratory failure with hypoxia and hypercapnia (HCC) Active Problems:   Obesity hypoventilation syndrome and obstructive sleep apnea   Acute on chronic diastolic CHF (congestive heart failure) (HCC)   Type 2 diabetes mellitus without complication (HCC)   Urinary tract infection without hematuria   Class 3 obesity (HCC)   History of pulmonary embolism   Self neglect and cognitive impairment   Hypokalemia and hypomagnesemia   Hypocalcemia   Principal problem Obesity hypoventilation syndrome and obstructive sleep apnea -Has chronic hypercarbic respiratory failure due to OSA/OHS/COPD/dCHF and presented in respiratory distress requiring BiPAP, acidotic, PCO2 80, likely in the setting of nonadherence to nightly CPAP per patient.  Give steroids and bronchodilators and IV fluids given AKI, but respiratory status deteriorated and she was  intubated.  She was treated with steroids, bronchodilators, diuretics and eventually extubated.  She eventually came off oxygen and currently she is stable on room air.   Active problems Acute on chronic diastolic CHF (congestive heart failure) (HCC)-Echo shows EF 60 to 65%, LVH, no significant valvular disease.  She was diuresed, respiratory status improved, currently appears euvolemic.  Continue oral furosemide   Type 2 diabetes mellitus without complication (HCC) -Glucose well controlled. Last hemoglobin A1c 6.2%.  Continue sliding scale  Urinary tract infection without hematuria -Completed course of antibiotics for UTI.   Class 3 obesity (HCC) -BMI 81.2   Hypocalcemia -Supplemented and resolved.   Hypokalemia and hypomagnesemia -Supplemented and resolved.   History of pulmonary embolism -Continue rivaroxaban  Scheduled Meds:  (feeding supplement) PROSource Plus  30 mL Oral BID BM   carvedilol  3.125 mg Oral BID WC   furosemide  40 mg Oral BID   multivitamin with minerals  1 tablet Oral Daily   mouth rinse  15 mL Mouth Rinse 4 times per day   potassium chloride  40 mEq Oral BID   Ensure Max Protein  11 oz Oral BID   rivaroxaban  10 mg Oral Daily   Continuous Infusions:  sodium chloride 10 mL/hr at 11/06/21 0300   PRN Meds:.sodium chloride, albuterol, diphenhydrAMINE-zinc acetate, docusate sodium, mouth rinse, polyethylene glycol  Current Outpatient Medications  Medication Instructions   Multiple Vitamin (MULTIVITAMIN WITH MINERALS) TABS tablet 1 tablet, Oral, Daily   pantoprazole (PROTONIX) 40 mg, Oral, Daily at bedtime   torsemide (DEMADEX) 20 mg, Oral, Daily    Diet Orders (From admission, onward)     Start     Ordered   10/25/21 1448  Diet Carb Modified  Fluid consistency: Thin; Room service appropriate? Yes; Fluid restriction: 1200 mL Fluid  Diet effective now       Question Answer Comment  Diet-HS Snack? Nothing   Calorie Level Medium 1600-2000   Fluid  consistency: Thin   Room service appropriate? Yes   Fluid restriction: 1200 mL Fluid      10/25/21 1447            DVT prophylaxis: rivaroxaban (XARELTO) tablet 10 mg Start: 10/23/21 1300 SCDs Start: 10/19/21 2107 rivaroxaban (XARELTO) tablet 10 mg   Lab Results  Component Value Date   PLT 183 11/05/2021      Code Status: Full Code  Family Communication: no family at bedside   Status is: Inpatient  Remains inpatient appropriate because: placement  Level of care: Med-Surg  Consultants:  Psychiatry  PCCM  Objective: Vitals:   11/08/21 1942 11/08/21 2237 11/09/21 0325 11/09/21 0431  BP: (!) 102/52   (!) 116/51  Pulse: 81 75 64 63  Resp: 20 (!) 24 17 20   Temp: 97.8 F (36.6 C)   98.6 F (37 C)  TempSrc: Oral   Oral  SpO2: 99% 98% 96% 97%  Weight:      Height:        Intake/Output Summary (Last 24 hours) at 11/09/2021 1240 Last data filed at 11/09/2021 0547 Gross per 24 hour  Intake 360 ml  Output 1650 ml  Net -1290 ml    Wt Readings from Last 3 Encounters:  11/08/21 (!) 186 kg  09/17/20 (!) 177.4 kg  09/08/20 (!) 176.5 kg    Examination:  Constitutional: NAD   Data Reviewed: I have independently reviewed following labs and imaging studies   CBC Recent Labs  Lab 11/05/21 0511  WBC 7.0  HGB 10.3*  HCT 37.2  PLT 183  MCV 81.0  MCH 22.4*  MCHC 27.7*  RDW 23.8*     Recent Labs  Lab 11/03/21 0456 11/05/21 0511 11/06/21 0442 11/08/21 0655  NA  --  141 140 138  K 3.6 4.0 3.9 3.9  CL  --  103 101 100  CO2  --  32 33* 31  GLUCOSE  --  87 89 97  BUN  --  7 9 11   CREATININE  --  0.70 0.70 0.65  CALCIUM  --  8.6* 8.4* 8.4*  AST  --  18 18  --   ALT  --  17 15  --   ALKPHOS  --  62 70  --   BILITOT  --  1.1 0.9  --   ALBUMIN  --  2.9* 2.9*  --   MG  --  1.8 1.6* 2.0     ------------------------------------------------------------------------------------------------------------------ No results for input(s): "CHOL", "HDL",  "LDLCALC", "TRIG", "CHOLHDL", "LDLDIRECT" in the last 72 hours.  Lab Results  Component Value Date   HGBA1C 6.2 (H) 06/18/2021   ------------------------------------------------------------------------------------------------------------------ No results for input(s): "TSH", "T4TOTAL", "T3FREE", "THYROIDAB" in the last 72 hours.  Invalid input(s): "FREET3"  Cardiac Enzymes No results for input(s): "CKMB", "TROPONINI", "MYOGLOBIN" in the last 168 hours.  Invalid input(s): "CK" ------------------------------------------------------------------------------------------------------------------    Component Value Date/Time   BNP 778.8 (H) 10/19/2021 1837    CBG: Recent Labs  Lab 11/04/21 2101 11/05/21 0559 11/05/21 0816 11/05/21 2129 11/06/21 0210  GLUCAP 108* 86 88 103* 91     No results found for this or any previous visit (from the past 240 hour(s)).   Radiology Studies: No results found.   2130, MD,  PhD [facility]  Between 7 am - 7 pm I am available, please contact me via Amion (for emergencies) or Securechat (non urgent messages)  Between 7 pm - 7 am I am not available, please contact night coverage MD/APP via Amion

## 2021-11-10 DIAGNOSIS — J9621 Acute and chronic respiratory failure with hypoxia: Secondary | ICD-10-CM | POA: Diagnosis not present

## 2021-11-10 DIAGNOSIS — J9622 Acute and chronic respiratory failure with hypercapnia: Secondary | ICD-10-CM | POA: Diagnosis not present

## 2021-11-10 NOTE — TOC Progression Note (Signed)
Transition of Care [facility]) - Progression Note    Patient Details  Name: Felicia Warren MRN: [id number] Date of Birth: November 12, 1970  Transition of Care [facility]) CM/SW Contact  Shulamit Donofrio, Juliann Pulse, RN Phone Number: 11/10/2021, 9:46 AM  Clinical Narrative:TC Elberta Fortis [facility](Administrator) with [facility]-faxed signed fl2 since he states he never received the first one-confirmed fax#[phone]. Noted may only a front wheeled rw. Will confirm if HHPT is needed. Continue to monitor for d/c plans.     Expected Discharge Plan: [place] Barriers to Discharge: Continued Medical Work up  Expected Discharge Plan and Services Expected Discharge Plan: [place] arrangements for the past 2 months: Homeless                                       Social Determinants of Health (SDOH) Interventions Housing Interventions: Inpatient TOC  Readmission Risk Interventions     No data to display

## 2021-11-10 NOTE — Progress Notes (Signed)
Mobility Specialist - Progress Note   11/10/21 1405  Mobility  HOB Elevated/Bed Position Self regulated  Activity Ambulated with assistance in hallway  Range of Motion/Exercises Active;Right arm;Left arm  Level of Assistance Standby assist, set-up cues, supervision of patient - no hands on  Assistive Device Front wheel walker;Wheelchair  Distance Ambulated (ft) 250 ft  Activity Response Tolerated well  Transport method Ambulatory  $Mobility charge 1 Mobility   Pt received in bed and agreeable to mobility. Took pt outside to do some arm exercises with theraband. Upon returning to the unit pt requested to ambulate in hall. Pt to room with lunch on tray and all necessities in reach.   [facility]

## 2021-11-10 NOTE — Progress Notes (Signed)
PROGRESS NOTE  Felicia Warren PPI:[id number] DOB: [birth year] DOA: 10/19/2021 PCP: Elsie Stain, MD   LOS: 22 days   Brief Narrative / Interim history: 51 year old female with morbid obesity, OHS/OSA, COPD, diastolic CHF, DM 2, presented to the hospital because EMS were called by the motel where she was living as she could not exit the room so they could clean it, and has soiled herself for several days.  She was disoriented in the ED, acidotic with a PCO2 of 80, acute kidney injury and was admitted to the hospital.  Significant events: 8/28: Admitted for COPD 8/29: Failed BiPAP and intubated 8/31: Extubated 9/2: Transferred out of the ICU, Psychiatry consulted, felt patient does not have capacity  9/7: Transitioned to oral diuretics 9/15: Stable, awaiting placement  Significant imaging / results / micro data: CXR 8/28-no acute cardiopulmonary disease Urine culture 8/28-70,000 colonies lactobacillus COVID, RVP 8/28-negative  Subjective / 24h Interval events: Was ambulating in the hallway a little bit earlier.  Feeling well.  Tells me that the morning multivitamin is making her nauseous  Assesement and Plan: Principal Problem:   Acute on chronic respiratory failure with hypoxia and hypercapnia (HCC) Active Problems:   Obesity hypoventilation syndrome and obstructive sleep apnea   Acute on chronic diastolic CHF (congestive heart failure) (HCC)   Type 2 diabetes mellitus without complication (HCC)   Urinary tract infection without hematuria   Class 3 obesity (HCC)   History of pulmonary embolism   Self neglect and cognitive impairment   Hypokalemia and hypomagnesemia   Hypocalcemia   Principal problem Obesity hypoventilation syndrome and obstructive sleep apnea -Has chronic hypercarbic respiratory failure due to OSA/OHS/COPD/dCHF and presented in respiratory distress requiring BiPAP, acidotic, PCO2 80, likely in the setting of nonadherence to nightly CPAP per patient.  Give  steroids and bronchodilators and IV fluids given AKI, but respiratory status deteriorated and she was intubated.  She was treated with steroids, bronchodilators, diuretics and eventually extubated.  She eventually came off oxygen and currently she is stable on room air.  Needs nightly CPAP   Active problems Acute on chronic diastolic CHF (congestive heart failure) (HCC)-Echo shows EF 60 to 65%, LVH, no significant valvular disease.  She was diuresed, respiratory status improved, currently appears euvolemic.  Continue oral furosemide   Type 2 diabetes mellitus without complication (HCC) -Glucose well controlled. Last hemoglobin A1c 6.2%.  Continue sliding scale  Morbid obesity-BMI 75, she would benefit from weight loss  Urinary tract infection without hematuria -Completed course of antibiotics for UTI.    Hypocalcemia -Supplemented and resolved.   Hypokalemia and hypomagnesemia -Supplemented and resolved.   History of pulmonary embolism -Continue rivaroxaban  Self neglect and cognitive impairment -Earlier during this hospitalization, Psychiatry were consulted and agreed with attending that patient did not appear to have capacity to safely manage her complex cardiopulmonary disease.  APS were engaged and guardianship is being sought.  I agree with Dr. Sabino Niemann assessment 9/12, I have also approached and engage patient about discussions about her health conditions, she is unable to tell me what conditions she has except for the fact that they are "rare".  She has been persistently refusing Coreg, and when asked she says "I do not need it".  She cannot elaborate as to why she does not need it or rationale for why she is refusing it.  She seems to have deficits in problem solving and executive function, she does not appear sufficiently safe to care for herself alone  Scheduled Meds:  (feeding  supplement) PROSource Plus  30 mL Oral BID BM   carvedilol  3.125 mg Oral BID WC   furosemide  40 mg Oral  BID   multivitamin with minerals  1 tablet Oral Daily   mouth rinse  15 mL Mouth Rinse 4 times per day   potassium chloride  40 mEq Oral BID   Ensure Max Protein  11 oz Oral BID   rivaroxaban  10 mg Oral Daily   Continuous Infusions:  sodium chloride 10 mL/hr at 11/06/21 0300   PRN Meds:.sodium chloride, albuterol, diphenhydrAMINE-zinc acetate, docusate sodium, mouth rinse, polyethylene glycol  Current Outpatient Medications  Medication Instructions   Multiple Vitamin (MULTIVITAMIN WITH MINERALS) TABS tablet 1 tablet, Oral, Daily   pantoprazole (PROTONIX) 40 mg, Oral, Daily at bedtime   torsemide (DEMADEX) 20 mg, Oral, Daily    Diet Orders (From admission, onward)     Start     Ordered   10/25/21 1448  Diet Carb Modified Fluid consistency: Thin; Room service appropriate? Yes; Fluid restriction: 1200 mL Fluid  Diet effective now       Question Answer Comment  Diet-HS Snack? Nothing   Calorie Level Medium 1600-2000   Fluid consistency: Thin   Room service appropriate? Yes   Fluid restriction: 1200 mL Fluid      10/25/21 1447            DVT prophylaxis: rivaroxaban (XARELTO) tablet 10 mg Start: 10/23/21 1300 SCDs Start: 10/19/21 2107 rivaroxaban (XARELTO) tablet 10 mg   Lab Results  Component Value Date   PLT 183 11/05/2021      Code Status: Full Code  Family Communication: no family at bedside   Status is: Inpatient  Remains inpatient appropriate because: placement  Level of care: Med-Surg  Consultants:  Psychiatry  PCCM  Objective: Vitals:   11/10/21 0135 11/10/21 0350 11/10/21 0424 11/10/21 0806  BP:   (!) 118/52 (!) 120/55  Pulse: 82 79 66 64  Resp: 20 20 (!) 22   Temp:   98.4 F (36.9 C)   TempSrc:   Oral   SpO2: 95% 93% 98%   Weight:   (!) 180 kg   Height:        Intake/Output Summary (Last 24 hours) at 11/10/2021 1043 Last data filed at 11/10/2021 0900 Gross per 24 hour  Intake 720 ml  Output 1800 ml  Net -1080 ml    Wt Readings  from Last 3 Encounters:  11/10/21 (!) 180 kg  09/17/20 (!) 177.4 kg  09/08/20 (!) 176.5 kg    Examination:  Constitutional: NAD   Data Reviewed: I have independently reviewed following labs and imaging studies   CBC Recent Labs  Lab 11/05/21 0511  WBC 7.0  HGB 10.3*  HCT 37.2  PLT 183  MCV 81.0  MCH 22.4*  MCHC 27.7*  RDW 23.8*     Recent Labs  Lab 11/05/21 0511 11/06/21 0442 11/08/21 0655  NA 141 140 138  K 4.0 3.9 3.9  CL 103 101 100  CO2 32 33* 31  GLUCOSE 87 89 97  BUN 7 9 11   CREATININE 0.70 0.70 0.65  CALCIUM 8.6* 8.4* 8.4*  AST 18 18  --   ALT 17 15  --   ALKPHOS 62 70  --   BILITOT 1.1 0.9  --   ALBUMIN 2.9* 2.9*  --   MG 1.8 1.6* 2.0     ------------------------------------------------------------------------------------------------------------------ No results for input(s): "CHOL", "HDL", "LDLCALC", "TRIG", "CHOLHDL", "LDLDIRECT" in the last  72 hours.  Lab Results  Component Value Date   HGBA1C 6.2 (H) 06/18/2021   ------------------------------------------------------------------------------------------------------------------ No results for input(s): "TSH", "T4TOTAL", "T3FREE", "THYROIDAB" in the last 72 hours.  Invalid input(s): "FREET3"  Cardiac Enzymes No results for input(s): "CKMB", "TROPONINI", "MYOGLOBIN" in the last 168 hours.  Invalid input(s): "CK" ------------------------------------------------------------------------------------------------------------------    Component Value Date/Time   BNP 778.8 (H) 10/19/2021 1837    CBG: Recent Labs  Lab 11/04/21 2101 11/05/21 0559 11/05/21 0816 11/05/21 2129 11/06/21 0210  GLUCAP 108* 86 88 103* 91     No results found for this or any previous visit (from the past 240 hour(s)).   Radiology Studies: No results found.   Pamella Pert, MD, PhD [facility]  Between 7 am - 7 pm I am available, please contact me via Amion (for emergencies) or Securechat (non  urgent messages)  Between 7 pm - 7 am I am not available, please contact night coverage MD/APP via Amion

## 2021-11-10 NOTE — Progress Notes (Signed)
Mobility Specialist - Progress Note   11/10/21 0925  Mobility  HOB Elevated/Bed Position Self regulated  Activity Ambulated with assistance in hallway  Range of Motion/Exercises Active  Level of Assistance Standby assist, set-up cues, supervision of patient - no hands on  Assistive Device Front wheel walker  Distance Ambulated (ft) 350 ft  Activity Response Tolerated well  Transport method Ambulatory  $Mobility charge 1 Mobility   Pt received in recliner and agreeable to mobility. Pt to recliner after session with all needs met.    [facility]

## 2021-11-11 DIAGNOSIS — J9622 Acute and chronic respiratory failure with hypercapnia: Secondary | ICD-10-CM | POA: Diagnosis not present

## 2021-11-11 DIAGNOSIS — J9621 Acute and chronic respiratory failure with hypoxia: Secondary | ICD-10-CM | POA: Diagnosis not present

## 2021-11-11 NOTE — Progress Notes (Signed)
Mobility Specialist - Progress Note   11/11/21 0930  Mobility  HOB Elevated/Bed Position Self regulated  Activity Ambulated with assistance in hallway  Range of Motion/Exercises Active  Level of Assistance Modified independent, requires aide device or extra time  Assistive Device Front wheel walker  Distance Ambulated (ft) 285 ft  Activity Response Tolerated well  Transport method Ambulatory  $Mobility charge 1 Mobility   Pt received in bathroom and agreeable to mobility. No complaints during ambulation. Pt to recliner after session w/ MD in room and all necessities in reach.    [facility]

## 2021-11-11 NOTE — Progress Notes (Signed)
Took pt off bipap this am. She is on RA with no distress.

## 2021-11-11 NOTE — TOC Progression Note (Signed)
Transition of Care [facility]) - Progression Note    Patient Details  Name: Felicia Warren MRN: [id number] Date of Birth: [birth year]  Transition of Care [facility]) CM/SW Contact  Viridiana Spaid, Juliann Pulse, RN Phone Number: 11/11/2021, 2:38 PM  Clinical Narrative: Damaris Schooner to Carin Primrose briefly since he was unable to discuss case(getting root canal)-will f/u in ma on status of review for acceptance to Group Home.      Expected Discharge Plan: Group Home Barriers to Discharge: Continued Medical Work up  Expected Discharge Plan and Services Expected Discharge Plan: Group Home       Living arrangements for the past 2 months: Homeless                                       Social Determinants of Health (SDOH) Interventions Housing Interventions: Inpatient TOC  Readmission Risk Interventions     No data to display

## 2021-11-11 NOTE — Plan of Care (Signed)
  Problem: Education: Goal: Knowledge of General Education information will improve Description: Including pain rating scale, medication(s)/side effects and non-pharmacologic comfort measures Outcome: Progressing   Problem: Clinical Measurements: Goal: Will remain free from infection Outcome: Progressing Goal: Respiratory complications will improve Outcome: Progressing Goal: Cardiovascular complication will be avoided Outcome: Progressing   Problem: Activity: Goal: Risk for activity intolerance will decrease Outcome: Progressing   

## 2021-11-11 NOTE — Progress Notes (Signed)
PROGRESS NOTE  Felicia Warren  [id number] DOB: 10-17-1970 DOA: 10/19/2021 PCP: Elsie Stain, MD   Brief Narrative: Patient is a 51 year old female with history of morbid obesity, OHS/OSA, COPD, diastolic CHF, diabetes type 2 who [place] motel.  On admission she was disoriented, acidotic with PCO2 of 80.  Lab work showed AKI.  She was admitted under PCCM service and was managed for COPD exacerbation.  She failed BiPAP therapy and was intubated on 8/29, extubated on 8/31 and was transferred out of ICU on 9/2.  Currently she is awaiting placement.  TOC following.  Assessment & Plan:  Principal Problem:   Acute on chronic respiratory failure with hypoxia and hypercapnia (HCC) Active Problems:   Obesity hypoventilation syndrome and obstructive sleep apnea   Acute on chronic diastolic CHF (congestive heart failure) (HCC)   Type 2 diabetes mellitus without complication (HCC)   Urinary tract infection without hematuria   Class 3 obesity (HCC)   History of pulmonary embolism   Self neglect and cognitive impairment   Hypokalemia and hypomagnesemia   Hypocalcemia  Acute hypoxic respiratory failure: Multifactorial secondary to OSH/OSA/COPD/diastolic CHF.  ABG on presentation showed PCO2 of 80, started on BiPAP.  Has history of OSA but nonadherent to CPAP.  Patient was treated with steroids, bronchodilators initially but respiratory status continued to decline and she was intubated.  Extubated on 8/31.  Currently on room air.  Continue nightly CPAP therapy  Acute on chronic diastolic CHF: Echo shows EF of 60 to 60%, left ventricle hypertrophy.  She is IV diuresed.  Continue oral torsemide  Diabetes type 2: Hemoglobin A1c of 6.2.  Currently on sliding scale insulin.  UTI: Completed course of antibiotics  Hypokalemia/hypomagnesemia/hypocalcemia: Supplemented and resolved  History of PE: On Xarelto  Self-neglect/cognitive impairment: Psychiatry was consulted during this  hospitalization.  Patient does not appear to have capacity to safely manage her complex medical problems.  APS engaged, guardianship is being sought.  She does not appear to be able to take care of herself.  TOC following  Disposition: TOC looking for group home  Moderate obesity: BMI of 74.9   Nutrition Problem: Inadequate oral intake Etiology: inability to eat    DVT prophylaxis:rivaroxaban (XARELTO) tablet 10 mg Start: 10/23/21 1300 SCDs Start: 10/19/21 2107 rivaroxaban (XARELTO) tablet 10 mg     Code Status: Full Code  Family Communication: None at bedside  Patient status:Inpatient  [facility] from [facility]  Anticipated discharge LP:[id number]  Estimated DC date:Not sure   Consultants: PCCM  Procedures:Intubation  Antimicrobials:  Anti-infectives (From admission, onward)    Start     Dose/Rate Route Frequency Ordered Stop   10/22/21 2100  cefTRIAXone (ROCEPHIN) 1 g in sodium chloride 0.9 % 100 mL IVPB        1 g 200 mL/hr over 30 Minutes Intravenous Every 24 hours 10/22/21 0748 10/22/21 2203   10/20/21 2100  cefTRIAXone (ROCEPHIN) 1 g in sodium chloride 0.9 % 100 mL IVPB  Status:  Discontinued        1 g 200 mL/hr over 30 Minutes Intravenous Every 24 hours 10/19/21 2118 10/22/21 0748   10/19/21 2100  cefTRIAXone (ROCEPHIN) 2 g in sodium chloride 0.9 % 100 mL IVPB        2 g 200 mL/hr over 30 Minutes Intravenous  Once 10/19/21 2046 10/19/21 2238       Subjective: Patient seen and examined at the bedside today.  Hemodynamically stable, comfortable.  Sitting in the chair.  On  room air. Denies  Any complaints  Objective: Vitals:   11/10/21 1956 11/10/21 2318 11/11/21 0315 11/11/21 0520  BP: (!) 108/49   (!) 124/59  Pulse: 80 84 65 70  Resp: 20 18 20 20   Temp: 98.2 F (36.8 C)   97.9 F (36.6 C)  TempSrc: Oral   Oral  SpO2: 92% 95% 96% 100%  Weight:      Height:        Intake/Output Summary (Last 24 hours) at 11/11/2021 0857 Last data filed at 11/11/2021  0526 Gross per 24 hour  Intake 480 ml  Output 2550 ml  Net -2070 ml   Filed Weights   11/07/21 0450 11/08/21 0646 11/10/21 0424  Weight: (!) 188 kg (!) 186 kg (!) 180 kg    Examination:  General exam: Overall comfortable, not in distress, morbidly obese HEENT: PERRL Respiratory system:  no wheezes or crackles  Cardiovascular system: S1 & S2 heard, RRR.  Gastrointestinal system: Abdomen is nondistended, soft and nontender. Central nervous system: Alert and oriented Extremities: No edema, no clubbing ,no cyanosis Skin: No rashes, no ulcers,no icterus     Data Reviewed: I have personally reviewed following labs and imaging studies  CBC: Recent Labs  Lab 11/05/21 0511  WBC 7.0  HGB 10.3*  HCT 37.2  MCV 81.0  PLT 010   Basic Metabolic Panel: Recent Labs  Lab 11/05/21 0511 11/06/21 0442 11/08/21 0655  NA 141 140 138  K 4.0 3.9 3.9  CL 103 101 100  CO2 32 33* 31  GLUCOSE 87 89 97  BUN 7 9 11   CREATININE 0.70 0.70 0.65  CALCIUM 8.6* 8.4* 8.4*  MG 1.8 1.6* 2.0  PHOS 4.5 5.1*  --      No results found for this or any previous visit (from the past 240 hour(s)).   Radiology Studies: No results found.  Scheduled Meds:  (feeding supplement) PROSource Plus  30 mL Oral BID BM   carvedilol  3.125 mg Oral BID WC   furosemide  40 mg Oral BID   mouth rinse  15 mL Mouth Rinse 4 times per day   potassium chloride  40 mEq Oral BID   Ensure Max Protein  11 oz Oral BID   rivaroxaban  10 mg Oral Daily   Continuous Infusions:  sodium chloride 10 mL/hr at 11/06/21 0300     LOS: 23 days   Shelly Coss, MD [facility] P9/20/2023, 8:57 AM

## 2021-11-12 DIAGNOSIS — J9621 Acute and chronic respiratory failure with hypoxia: Secondary | ICD-10-CM | POA: Diagnosis not present

## 2021-11-12 DIAGNOSIS — J9622 Acute and chronic respiratory failure with hypercapnia: Secondary | ICD-10-CM | POA: Diagnosis not present

## 2021-11-12 NOTE — Progress Notes (Signed)
Mobility Specialist - Progress Note   11/12/21 0925  Mobility  HOB Elevated/Bed Position Self regulated  Activity Ambulated with assistance in hallway  Range of Motion/Exercises Active  Level of Assistance Modified independent, requires aide device or extra time  Assistive Device Front wheel walker  Distance Ambulated (ft) 325 ft  Activity Response Tolerated well  Transport method Ambulatory  $Mobility charge 1 Mobility   Pt received in recliner and agreeable to mobility. No complaints during ambulation. Pt to recliner after session with all needs met.    [facility]

## 2021-11-12 NOTE — Progress Notes (Signed)
PROGRESS NOTE  Felicia Warren  [id number] DOB: 04/21/1970 DOA: 10/19/2021 PCP: Elsie Stain, MD   Brief Narrative: Patient is a 51 year old female with history of morbid obesity, OHS/OSA, COPD, diastolic CHF, diabetes type 2 who [place] motel.  On admission she was disoriented, acidotic with PCO2 of 80.  Lab work showed AKI.  She was admitted under PCCM service and was managed for COPD exacerbation.  She failed BiPAP therapy and was intubated on 8/29, extubated on 8/31 and was transferred out of ICU on 9/2.  Currently she is awaiting placement.  TOC following.  Assessment & Plan:  Principal Problem:   Acute on chronic respiratory failure with hypoxia and hypercapnia (HCC) Active Problems:   Obesity hypoventilation syndrome and obstructive sleep apnea   Acute on chronic diastolic CHF (congestive heart failure) (HCC)   Type 2 diabetes mellitus without complication (HCC)   Urinary tract infection without hematuria   Class 3 obesity (HCC)   History of pulmonary embolism   Self neglect and cognitive impairment   Hypokalemia and hypomagnesemia   Hypocalcemia  Acute hypoxic respiratory failure: Multifactorial secondary to OSH/OSA/COPD/diastolic CHF.  ABG on presentation showed PCO2 of 80, started on BiPAP.  Has history of OSA but nonadherent to CPAP.  Patient was treated with steroids, bronchodilators initially but respiratory status continued to decline and she was intubated.  Extubated on 8/31.  Currently on room air.  Continue nightly CPAP therapy  Acute on chronic diastolic CHF: Echo shows EF of 60 to 60%, left ventricle hypertrophy.  She is IV diuresed.  Continue oral torsemide  Diabetes type 2: Hemoglobin A1c of 6.2.  Currently on sliding scale insulin.  UTI: Completed course of antibiotics  Hypokalemia/hypomagnesemia/hypocalcemia: Supplemented and resolved  History of PE: On Xarelto  Self-neglect/cognitive impairment: Psychiatry was consulted during this  hospitalization.  Patient does not appear to have capacity to safely manage her complex medical problems.  APS engaged, guardianship is being sought.  She does not appear to be able to take care of herself.  TOC following  Disposition: TOC looking for group home  Moderate obesity: BMI of 74.9   Nutrition Problem: Inadequate oral intake Etiology: inability to eat    DVT prophylaxis:rivaroxaban (XARELTO) tablet 10 mg Start: 10/23/21 1300 SCDs Start: 10/19/21 2107 rivaroxaban (XARELTO) tablet 10 mg     Code Status: Full Code  Family Communication: None at bedside  Patient status:Inpatient  [facility] is [place] [facility]  Anticipated discharge WG:[id number]  Estimated DC date:Not sure   Consultants: PCCM  Procedures:Intubation  Antimicrobials:  Anti-infectives (From admission, onward)    Start     Dose/Rate Route Frequency Ordered Stop   10/22/21 2100  cefTRIAXone (ROCEPHIN) 1 g in sodium chloride 0.9 % 100 mL IVPB        1 g 200 mL/hr over 30 Minutes Intravenous Every 24 hours 10/22/21 0748 10/22/21 2203   10/20/21 2100  cefTRIAXone (ROCEPHIN) 1 g in sodium chloride 0.9 % 100 mL IVPB  Status:  Discontinued        1 g 200 mL/hr over 30 Minutes Intravenous Every 24 hours 10/19/21 2118 10/22/21 0748   10/19/21 2100  cefTRIAXone (ROCEPHIN) 2 g in sodium chloride 0.9 % 100 mL IVPB        2 g 200 mL/hr over 30 Minutes Intravenous  Once 10/19/21 2046 10/19/21 2238       Subjective: Patient seen and examined at bedside this mrng. Comfortable,lying on bed.No new complains  Objective: Vitals:   11/11/21  2147 11/11/21 2336 11/12/21 0607 11/12/21 0849  BP: (!) 98/47  125/65 125/71  Pulse: 84  (!) 57 80  Resp: 20  18   Temp: 98.7 F (37.1 C)  97.6 F (36.4 C)   TempSrc: Oral  Oral   SpO2: 96% 96% 100%   Weight:   (!) 176 kg   Height:        Intake/Output Summary (Last 24 hours) at 11/12/2021 1050 Last data filed at 11/12/2021 1045 Gross per 24 hour  Intake 660 ml  Output  1050 ml  Net -390 ml   Filed Weights   11/08/21 0646 11/10/21 0424 11/12/21 0607  Weight: (!) 186 kg (!) 180 kg (!) 176 kg    Examination:   General exam: Overall comfortable, not in distress,obese HEENT: PERRL Respiratory system:  no wheezes or crackles  Cardiovascular system: S1 & S2 heard, RRR.  Gastrointestinal system: Abdomen is nondistended, soft and nontender. Central nervous system: Alert and oriented Extremities: No edema, no clubbing ,no cyanosis Skin: No rashes, no ulcers,no icterus    Data Reviewed: I have personally reviewed following labs and imaging studies  CBC: No results for input(s): "WBC", "NEUTROABS", "HGB", "HCT", "MCV", "PLT" in the last 168 hours.  Basic Metabolic Panel: Recent Labs  Lab 11/06/21 0442 11/08/21 0655  NA 140 138  K 3.9 3.9  CL 101 100  CO2 33* 31  GLUCOSE 89 97  BUN 9 11  CREATININE 0.70 0.65  CALCIUM 8.4* 8.4*  MG 1.6* 2.0  PHOS 5.1*  --      No results found for this or any previous visit (from the past 240 hour(s)).   Radiology Studies: No results found.  Scheduled Meds:  (feeding supplement) PROSource Plus  30 mL Oral BID BM   carvedilol  3.125 mg Oral BID WC   furosemide  40 mg Oral BID   mouth rinse  15 mL Mouth Rinse 4 times per day   potassium chloride  40 mEq Oral BID   Ensure Max Protein  11 oz Oral BID   rivaroxaban  10 mg Oral Daily   Continuous Infusions:  sodium chloride 10 mL/hr at 11/06/21 0300     LOS: 24 days   Burnadette Pop, MD [facility] P9/21/2023, 10:50 AM

## 2021-11-12 NOTE — Progress Notes (Signed)
Mobility Specialist - Progress Note   11/12/21 1418  Mobility  HOB Elevated/Bed Position Self regulated  Activity Ambulated with assistance in hallway  Range of Motion/Exercises Active  Level of Assistance Modified independent, requires aide device or extra time  Assistive Device Front wheel walker;Wheelchair  Distance Ambulated (ft) 5 ft  Activity Response Tolerated well  Transport method Ambulatory  $Mobility charge 1 Mobility   Pt received in recliner and agreeable to mobility. Took pt outside to do some arm exercises. Pt to recliner after session with all necessities in reach.    [facility]

## 2021-11-12 NOTE — TOC Progression Note (Signed)
Transition of Care [facility]) - Progression Note    Patient Details  Name: Felicia Warren MRN: [id number] Date of Birth: 05-22-1970  Transition of Care [facility]) CM/SW Contact  Lafawn Lenoir, Juliann Pulse, RN Phone Number: 11/12/2021, 12:41 PM  Clinical Narrative:  Merlene Laughter rep for Group Home-he received fl2-await response if able to accept by tomorrow.     Expected Discharge Plan: Group Home Barriers to Discharge: Continued Medical Work up  Expected Discharge Plan and Services Expected Discharge Plan: Group Home       Living arrangements for the past 2 months: Homeless                                       Social Determinants of Health (SDOH) Interventions Housing Interventions: Inpatient TOC  Readmission Risk Interventions     No data to display

## 2021-11-13 DIAGNOSIS — J9621 Acute and chronic respiratory failure with hypoxia: Secondary | ICD-10-CM | POA: Diagnosis not present

## 2021-11-13 DIAGNOSIS — J9622 Acute and chronic respiratory failure with hypercapnia: Secondary | ICD-10-CM | POA: Diagnosis not present

## 2021-11-13 NOTE — TOC Progression Note (Signed)
Transition of Care [facility]) - Progression Note    Patient Details  Name: Felicia Warren MRN: [id number] Date of Birth: 14-Sep-1970  [facility]) CM/SW Contact  Joaquin Courts, RN Phone Number: 11/13/2021, 3:45 PM  Clinical Narrative:    CM spoke with Carin Primrose who reports he is unable to accept patient at this time.  FL2 sent out to [facility] for review, contact is Dyanne Iha [phone].   Expected Discharge Plan: Group Home Barriers to Discharge: Continued Medical Work up  Expected Discharge Plan and Services Expected Discharge Plan: Group Home       Living arrangements for the past 2 months: Homeless                                       Social Determinants of Health (SDOH) Interventions Housing Interventions: Inpatient TOC  Readmission Risk Interventions     No data to display

## 2021-11-13 NOTE — Progress Notes (Signed)
Mobility Specialist - Progress Note   11/13/21 0930  Mobility  HOB Elevated/Bed Position Self regulated  Activity Ambulated with assistance in hallway  Range of Motion/Exercises Active  Level of Assistance Modified independent, requires aide device or extra time  Assistive Device Front wheel walker  Distance Ambulated (ft) 500 ft  Activity Response Tolerated well  Transport method Ambulatory  $Mobility charge 1 Mobility   Pt received in recliner and agreeable to mobility. Pt to recliner after session with all needs met.     [facility]

## 2021-11-13 NOTE — Progress Notes (Signed)
PROGRESS NOTE  Felicia Warren  [id number]:[id number] DOB: 1970/11/24 DOA: 10/19/2021 PCP: Elsie Stain, MD   Brief Narrative: Patient is a 51 year old female with history of morbid obesity, OHS/OSA, COPD, diastolic CHF, diabetes type 2 who [place] motel.  On admission she was disoriented, acidotic with PCO2 of 80.  Lab work showed AKI.  She was admitted under PCCM service and was managed for COPD exacerbation.  She failed BiPAP therapy and was intubated on 8/29, extubated on 8/31 and was transferred out of ICU on 9/2.  Currently she is awaiting placement/guardianship.  TOC following.  Medically stable for discharge whenever possible  Assessment & Plan:  Principal Problem:   Acute on chronic respiratory failure with hypoxia and hypercapnia (HCC) Active Problems:   Obesity hypoventilation syndrome and obstructive sleep apnea   Acute on chronic diastolic CHF (congestive heart failure) (HCC)   Type 2 diabetes mellitus without complication (HCC)   Urinary tract infection without hematuria   Class 3 obesity (HCC)   History of pulmonary embolism   Self neglect and cognitive impairment   Hypokalemia and hypomagnesemia   Hypocalcemia  Acute hypoxic respiratory failure: Multifactorial secondary to OSH/OSA/COPD/diastolic CHF.  ABG on presentation showed PCO2 of 80, started on BiPAP.  Has history of OSA but nonadherent to CPAP.  Patient was treated with steroids, bronchodilators initially but respiratory status continued to decline and she was intubated.  Extubated on 8/31.  Currently on room air.  Continue nightly CPAP therapy  Acute on chronic diastolic CHF: Echo shows EF of 60 to 60%, left ventricle hypertrophy.  She is IV diuresed.  Continue oral torsemide  Diabetes type 2: Hemoglobin A1c of 6.2.  Currently on sliding scale insulin.  UTI: Completed course of antibiotics  Hypokalemia/hypomagnesemia/hypocalcemia: Supplemented and resolved  History of PE: On  Xarelto  Self-neglect/cognitive impairment: Psychiatry was consulted during this hospitalization.  Patient does not appear to have capacity to safely manage her complex medical problems.  APS engaged, guardianship is being sought.  She does not appear to be able to take care of herself.  TOC following  Disposition: TOC looking for group home, guardianship pending  Moderate obesity: BMI of 74.9   Nutrition Problem: Inadequate oral intake Etiology: inability to eat    DVT prophylaxis:rivaroxaban (XARELTO) tablet 10 mg Start: 10/23/21 1300 SCDs Start: 10/19/21 2107 rivaroxaban (XARELTO) tablet 10 mg     Code Status: Full Code  Family Communication: None at bedside  Patient status:Inpatient  [facility] is [place] [facility]  Anticipated discharge VO:ZDGUY Home  Estimated DC date:Not sure.  Medically stable for discharge   Consultants: PCCM  Procedures:Intubation  Antimicrobials:  Anti-infectives (From admission, onward)    Start     Dose/Rate Route Frequency Ordered Stop   10/22/21 2100  cefTRIAXone (ROCEPHIN) 1 g in sodium chloride 0.9 % 100 mL IVPB        1 g 200 mL/hr over 30 Minutes Intravenous Every 24 hours 10/22/21 0748 10/22/21 2203   10/20/21 2100  cefTRIAXone (ROCEPHIN) 1 g in sodium chloride 0.9 % 100 mL IVPB  Status:  Discontinued        1 g 200 mL/hr over 30 Minutes Intravenous Every 24 hours 10/19/21 2118 10/22/21 0748   10/19/21 2100  cefTRIAXone (ROCEPHIN) 2 g in sodium chloride 0.9 % 100 mL IVPB        2 g 200 mL/hr over 30 Minutes Intravenous  Once 10/19/21 2046 10/19/21 2238       Subjective: Patient seen and examined  at the bedside today.  Hemodynamically stable.  Sitting in the chair.  Denies new complaints  Objective: Vitals:   11/12/21 1700 11/12/21 2132 11/13/21 0447 11/13/21 0952  BP: 122/64 (!) 102/50 (!) 108/53 137/73  Pulse: 66 86 62 60  Resp:  20 18   Temp:  98.5 F (36.9 C) 97.9 F (36.6 C)   TempSrc:  Oral Oral   SpO2:  91% 100%    Weight:      Height:        Intake/Output Summary (Last 24 hours) at 11/13/2021 1106 Last data filed at 11/13/2021 0900 Gross per 24 hour  Intake 680 ml  Output 680 ml  Net 0 ml   Filed Weights   11/08/21 0646 11/10/21 0424 11/12/21 0607  Weight: (!) 186 kg (!) 180 kg (!) 176 kg    Examination:  General exam: Overall comfortable, not in distress,morbidly obese HEENT: PERRL Respiratory system:  no wheezes or crackles  Cardiovascular system: S1 & S2 heard, RRR.  Gastrointestinal system: Abdomen is nondistended, soft and nontender. Central nervous system: Alert and oriented Extremities: No edema, no clubbing ,no cyanosis Skin: No rashes, no ulcers,no icterus    Data Reviewed: I have personally reviewed following labs and imaging studies  CBC: No results for input(s): "WBC", "NEUTROABS", "HGB", "HCT", "MCV", "PLT" in the last 168 hours.  Basic Metabolic Panel: Recent Labs  Lab 11/08/21 0655  NA 138  K 3.9  CL 100  CO2 31  GLUCOSE 97  BUN 11  CREATININE 0.65  CALCIUM 8.4*  MG 2.0     No results found for this or any previous visit (from the past 240 hour(s)).   Radiology Studies: No results found.  Scheduled Meds:  (feeding supplement) PROSource Plus  30 mL Oral BID BM   carvedilol  3.125 mg Oral BID WC   furosemide  40 mg Oral BID   mouth rinse  15 mL Mouth Rinse 4 times per day   potassium chloride  40 mEq Oral BID   Ensure Max Protein  11 oz Oral BID   rivaroxaban  10 mg Oral Daily   Continuous Infusions:  sodium chloride 10 mL/hr at 11/06/21 0300     LOS: 25 days   Burnadette Pop, MD [facility] P9/22/2023, 11:06 AM

## 2021-11-13 NOTE — TOC Progression Note (Signed)
Transition of Care [facility]) - Progression Note    Patient Details  Name: Felicia Warren MRN: [id number] Date of Birth: 1970-05-16  [facility]) CM/SW Contact  Darrill Vreeland, Juliann Pulse, RN Phone Number: 11/13/2021, 9:40 AM  Clinical Narrative:Left vm w/Anthony rucker-group home administrator-await call back-he has rcvd signed fl2. Rcvd call from [facility]) states complete guardianship.       Expected Discharge Plan: Group Home Barriers to Discharge: Continued Medical Work up  Expected Discharge Plan and Services Expected Discharge Plan: Group Home       Living arrangements for the past 2 months: Homeless                                       Social Determinants of Health (SDOH) Interventions Housing Interventions: Inpatient TOC  Readmission Risk Interventions     No data to display

## 2021-11-13 NOTE — Progress Notes (Signed)
Mobility Specialist - Progress Note   11/13/21 1427  Mobility  HOB Elevated/Bed Position Self regulated  Activity Ambulated with assistance in hallway  Range of Motion/Exercises Active  Level of Assistance Modified independent, requires aide device or extra time  Assistive Device Front wheel walker;Wheelchair  Distance Ambulated (ft) 250 ft  Activity Response Tolerated well  Transport method Ambulatory  $Mobility charge 1 Mobility   Pt received in bed and agreeable to mobility. Took pt outside to work on theraband arm exercises. Upon arriving back to the unit pt requested to ambulate. Pt left in bathroom with all necessities in reach.    [facility]

## 2021-11-14 DIAGNOSIS — J9621 Acute and chronic respiratory failure with hypoxia: Secondary | ICD-10-CM | POA: Diagnosis not present

## 2021-11-14 DIAGNOSIS — J9622 Acute and chronic respiratory failure with hypercapnia: Secondary | ICD-10-CM | POA: Diagnosis not present

## 2021-11-14 LAB — BASIC METABOLIC PANEL
Anion gap: 7 (ref 5–15)
BUN: 10 mg/dL (ref 6–20)
CO2: 31 mmol/L (ref 22–32)
Calcium: 8.7 mg/dL — ABNORMAL LOW (ref 8.9–10.3)
Chloride: 103 mmol/L (ref 98–111)
Creatinine, Ser: 0.72 mg/dL (ref 0.44–1.00)
GFR, Estimated: >60 mL/min (ref >60)
Glucose, Bld: 92 mg/dL (ref 70–99)
Potassium: 4.1 mmol/L (ref 3.5–5.1)
Sodium: 141 mmol/L (ref 135–145)

## 2021-11-14 LAB — MAGNESIUM: Magnesium: 2 mg/dL (ref 1.7–2.4)

## 2021-11-14 NOTE — Plan of Care (Signed)
  Problem: Education: Goal: Knowledge of General Education information will improve Description: Including pain rating scale, medication(s)/side effects and non-pharmacologic comfort measures Outcome: Progressing   Problem: Health Behavior/Discharge Planning: Goal: Ability to manage health-related needs will improve Outcome: Progressing   Problem: Education: Goal: Ability to verbalize understanding of medication therapies will improve Outcome: Progressing   Problem: Activity: Goal: Capacity to carry out activities will improve Outcome: Progressing   Problem: Clinical Measurements: Goal: Diagnostic test results will improve Outcome: Adequate for Discharge Goal: Respiratory complications will improve Outcome: Adequate for Discharge   Problem: Activity: Goal: Risk for activity intolerance will decrease Outcome: Adequate for Discharge   Problem: Nutrition: Goal: Adequate nutrition will be maintained Outcome: Adequate for Discharge   Problem: Pain Managment: Goal: General experience of comfort will improve Outcome: Adequate for Discharge

## 2021-11-14 NOTE — Progress Notes (Signed)
Mobility Specialist - Progress Note   11/14/21 1442  Mobility  HOB Elevated/Bed Position Self regulated  Activity Ambulated with assistance in hallway  Range of Motion/Exercises Active;Left arm;Right arm  Level of Assistance Modified independent, requires aide device or extra time  Assistive Device Front wheel walker;Wheelchair  Distance Ambulated (ft) 250 ft  Activity Response Tolerated well  Transport method Ambulatory  $Mobility charge 1 Mobility   Pt received in recliner and agreeable to mobility. Took pt to lobby & assisted w/ theraband exercises for her arms. Pt to recliner after session with all needs met.    [facility]

## 2021-11-14 NOTE — Progress Notes (Signed)
Mobility Specialist - Progress Note   11/14/21 0943  Mobility  HOB Elevated/Bed Position Self regulated  Activity Ambulated with assistance in hallway  Range of Motion/Exercises Active  Level of Assistance Modified independent, requires aide device or extra time  Assistive Device Front wheel walker  Distance Ambulated (ft) 450 ft  Activity Response Tolerated well  Transport method Ambulatory  $Mobility charge 1 Mobility   Pt received in recliner and agreeable to mobility. No complaints during ambulation. Pt to recliner after session with all needs met.    [facility]

## 2021-11-14 NOTE — Progress Notes (Signed)
PROGRESS NOTE  Felicia Warren  MWU:[id number] DOB: 1970/11/24 DOA: 10/19/2021 PCP: Elsie Stain, MD   Brief Narrative: Patient is a 51 year old female with history of morbid obesity, OHS/OSA, COPD, diastolic CHF, diabetes type 2 who [place] a motel.  On admission she was disoriented, acidotic with PCO2 of 80.  Lab work showed AKI.  She was admitted under PCCM service and was managed for COPD exacerbation.  She failed BiPAP therapy and was intubated on 8/29, extubated on 8/31 and was transferred out of ICU on 9/2.  Currently she is awaiting placement/guardianship.  TOC following.  Medically stable for discharge whenever possible  Assessment & Plan:  Principal Problem:   Acute on chronic respiratory failure with hypoxia and hypercapnia (HCC) Active Problems:   Obesity hypoventilation syndrome and obstructive sleep apnea   Acute on chronic diastolic CHF (congestive heart failure) (HCC)   Type 2 diabetes mellitus without complication (HCC)   Urinary tract infection without hematuria   Class 3 obesity (HCC)   History of pulmonary embolism   Self neglect and cognitive impairment   Hypokalemia and hypomagnesemia   Hypocalcemia  Acute hypoxic respiratory failure: Multifactorial secondary to OSH/OSA/COPD/diastolic CHF.  ABG on presentation showed PCO2 of 80, started on BiPAP.  Has history of OSA but nonadherent to CPAP.  Patient was treated with steroids, bronchodilators initially but respiratory status continued to decline and she was intubated.  Extubated on 8/31.  Currently on room air.  Continue nightly CPAP therapy  Acute on chronic diastolic CHF: Echo shows EF of 60 to 60%, left ventricle hypertrophy.  She is IV diuresed.  Continue oral torsemide  Diabetes type 2: Hemoglobin A1c of 6.2.  Currently on sliding scale insulin.  UTI: Completed course of antibiotics  Hypokalemia/hypomagnesemia/hypocalcemia: Supplemented and resolved  History of PE: On  Xarelto  Self-neglect/cognitive impairment: Psychiatry was consulted during this hospitalization.  Patient does not appear to have capacity to safely manage her complex medical problems.  APS engaged, guardianship is being sought.  She does not appear to be able to take care of herself.  TOC following  Disposition: TOC looking for group home, guardianship pending  Moderate obesity: BMI of 74.9   Nutrition Problem: Inadequate oral intake Etiology: inability to eat    DVT prophylaxis:rivaroxaban (XARELTO) tablet 10 mg Start: 10/23/21 1300 SCDs Start: 10/19/21 2107 rivaroxaban (XARELTO) tablet 10 mg     Code Status: Full Code  Family Communication: None at bedside  Patient status:Inpatient  Patient is from [facility]  Anticipated discharge VO:ZDGUY Home  Estimated DC date:Not sure.  Medically stable for discharge   Consultants: PCCM  Procedures:Intubation  Antimicrobials:  Anti-infectives (From admission, onward)    Start     Dose/Rate Route Frequency Ordered Stop   10/22/21 2100  cefTRIAXone (ROCEPHIN) 1 g in sodium chloride 0.9 % 100 mL IVPB        1 g 200 mL/hr over 30 Minutes Intravenous Every 24 hours 10/22/21 0748 10/22/21 2203   10/20/21 2100  cefTRIAXone (ROCEPHIN) 1 g in sodium chloride 0.9 % 100 mL IVPB  Status:  Discontinued        1 g 200 mL/hr over 30 Minutes Intravenous Every 24 hours 10/19/21 2118 10/22/21 0748   10/19/21 2100  cefTRIAXone (ROCEPHIN) 2 g in sodium chloride 0.9 % 100 mL IVPB        2 g 200 mL/hr over 30 Minutes Intravenous  Once 10/19/21 2046 10/19/21 2238       Subjective: Patient seen and examined  at bedside today.  Sitting up in the chair.  No new complaints.  She later Ambulated on the hallway with mobility tech  Objective: Vitals:   11/13/21 2225 11/13/21 2300 11/14/21 0515 11/14/21 1044  BP: (!) 117/57  (!) 123/54 (!) 121/54  Pulse: 90 98 (!) 58 70  Resp: 18 (!) 31 18   Temp: 99 F (37.2 C)  (!) 97.5 F (36.4 C)   TempSrc:  Oral  Axillary   SpO2: 93% 94% 100%   Weight:      Height:        Intake/Output Summary (Last 24 hours) at 11/14/2021 1058 Last data filed at 11/13/2021 1828 Gross per 24 hour  Intake 640 ml  Output --  Net 640 ml   Filed Weights   11/08/21 0646 11/10/21 0424 11/12/21 0607  Weight: (!) 186 kg (!) 180 kg (!) 176 kg    Examination:  General exam: Overall comfortable, not in distress, morbidly obese HEENT: PERRL Respiratory system:  no wheezes or crackles  Cardiovascular system: S1 & S2 heard, RRR.  Gastrointestinal system: Abdomen is nondistended, soft and nontender. Central nervous system: Alert and oriented Extremities: No edema, no clubbing ,no cyanosis Skin: No rashes, no ulcers,no icterus      Data Reviewed: I have personally reviewed following labs and imaging studies  CBC: No results for input(s): "WBC", "NEUTROABS", "HGB", "HCT", "MCV", "PLT" in the last 168 hours.  Basic Metabolic Panel: Recent Labs  Lab 11/08/21 0655 11/14/21 0510  NA 138 141  K 3.9 4.1  CL 100 103  CO2 31 31  GLUCOSE 97 92  BUN 11 10  CREATININE 0.65 0.72  CALCIUM 8.4* 8.7*  MG 2.0 2.0     No results found for this or any previous visit (from the past 240 hour(s)).   Radiology Studies: No results found.  Scheduled Meds:  (feeding supplement) PROSource Plus  30 mL Oral BID BM   carvedilol  3.125 mg Oral BID WC   furosemide  40 mg Oral BID   mouth rinse  15 mL Mouth Rinse 4 times per day   potassium chloride  40 mEq Oral BID   Ensure Max Protein  11 oz Oral BID   rivaroxaban  10 mg Oral Daily   Continuous Infusions:  sodium chloride 10 mL/hr at 11/06/21 0300     LOS: 26 days   Shelly Coss, MD [facility] P9/23/2023, 10:58 AM

## 2021-11-15 DIAGNOSIS — J9622 Acute and chronic respiratory failure with hypercapnia: Secondary | ICD-10-CM | POA: Diagnosis not present

## 2021-11-15 DIAGNOSIS — J9621 Acute and chronic respiratory failure with hypoxia: Secondary | ICD-10-CM | POA: Diagnosis not present

## 2021-11-15 NOTE — TOC Progression Note (Signed)
Transition of Care [facility]) - Progression Note    Patient Details  Name: Felicia Warren MRN: [id number] Date of Birth: January 01, 1971  Transition of Care [facility]) CM/SW Contact  Abriel Geesey, Juliann Pulse, RN Phone Number: 11/15/2021, 4:13 PM  Clinical Narrative: Rep for [facility] Mariea Clonts [phone] assess patient for appropriateness-BIPAP PRN, uses rw while ambulating. Signed fl2,H&P,psych note,MD progress ntoe-given to ALF rep-per consent [name] sister-guardian;patient lacks capacity to make medical decisions for self.Await outcome of eval.      Expected Discharge Plan: Assisted Living Barriers to Discharge: Continued Medical Work up  Expected Discharge Plan and Services Expected Discharge Plan: Assisted Living   Discharge Planning Services: CM Consult Post Acute Care Choice:  (ALF) Living arrangements for the past 2 months: Homeless                                       Social Determinants of Health (SDOH) Interventions Housing Interventions: Inpatient TOC  Readmission Risk Interventions     No data to display

## 2021-11-15 NOTE — Progress Notes (Signed)
PROGRESS NOTE  Felicia Warren  ZOX:[id number] DOB: 1971-01-29 DOA: 10/19/2021 PCP: Storm Frisk, MD   Brief Narrative: Patient is a 51 year old female with history of morbid obesity, OHS/OSA, COPD, diastolic CHF, diabetes type 2 who presented here from a motel.  On admission she was disoriented, acidotic with PCO2 of 80.  Lab work showed AKI.  She was admitted under PCCM service and was managed for COPD exacerbation.  She failed BiPAP therapy and was intubated on 8/29, extubated on 8/31 and was transferred out of ICU on 9/2.  Currently she is awaiting placement/guardianship.  TOC following.  Medically stable for discharge whenever possible  Assessment & Plan:  Principal Problem:   Acute on chronic respiratory failure with hypoxia and hypercapnia (HCC) Active Problems:   Obesity hypoventilation syndrome and obstructive sleep apnea   Acute on chronic diastolic CHF (congestive heart failure) (HCC)   Type 2 diabetes mellitus without complication (HCC)   Urinary tract infection without hematuria   Class 3 obesity (HCC)   History of pulmonary embolism   Self neglect and cognitive impairment   Hypokalemia and hypomagnesemia   Hypocalcemia  Acute hypoxic respiratory failure: Multifactorial secondary to OSH/OSA/COPD/diastolic CHF.  ABG on presentation showed PCO2 of 80, started on BiPAP.  Has history of OSA but nonadherent to CPAP.  Patient was treated with steroids, bronchodilators initially but respiratory status continued to decline and she was intubated.  Extubated on 8/31.  Currently on room air.  Continue nightly CPAP therapy  Acute on chronic diastolic CHF: Echo shows EF of 60 to 60%, left ventricle hypertrophy.  She is IV diuresed.  Continue oral torsemide  Diabetes type 2: Hemoglobin A1c of 6.2.  Currently on sliding scale insulin.  UTI: Completed course of antibiotics  Hypokalemia/hypomagnesemia/hypocalcemia: Supplemented and resolved  History of PE: On  Xarelto  Self-neglect/cognitive impairment: Psychiatry was consulted during this hospitalization.  Patient does not appear to have capacity to safely manage her complex medical problems.  APS engaged, guardianship is being sought.  She does not appear to be able to take care of herself.  TOC following  Disposition: TOC looking for group home, guardianship pending  Moderate obesity: BMI of 74.9   Nutrition Problem: Inadequate oral intake Etiology: inability to eat    DVT prophylaxis:rivaroxaban (XARELTO) tablet 10 mg Start: 10/23/21 1300 SCDs Start: 10/19/21 2107 rivaroxaban (XARELTO) tablet 10 mg     Code Status: Full Code  Family Communication: None at bedside  Patient status:Inpatient  Patient is from [facility]  Anticipated discharge WJ:XBJYN Home  Estimated DC date:Not sure.  Medically stable for discharge   Consultants: PCCM  Procedures:Intubation  Antimicrobials:  Anti-infectives (From admission, onward)    Start     Dose/Rate Route Frequency Ordered Stop   10/22/21 2100  cefTRIAXone (ROCEPHIN) 1 g in sodium chloride 0.9 % 100 mL IVPB        1 g 200 mL/hr over 30 Minutes Intravenous Every 24 hours 10/22/21 0748 10/22/21 2203   10/20/21 2100  cefTRIAXone (ROCEPHIN) 1 g in sodium chloride 0.9 % 100 mL IVPB  Status:  Discontinued        1 g 200 mL/hr over 30 Minutes Intravenous Every 24 hours 10/19/21 2118 10/22/21 0748   10/19/21 2100  cefTRIAXone (ROCEPHIN) 2 g in sodium chloride 0.9 % 100 mL IVPB        2 g 200 mL/hr over 30 Minutes Intravenous  Once 10/19/21 2046 10/19/21 2238       Subjective: Seen and examined at  the bedside today.  Hemodynamically stable, comfortable without any complaints Objective: Vitals:   11/15/21 0405 11/15/21 0500 11/15/21 0511 11/15/21 0817  BP:   (!) 110/52 122/60  Pulse: 61  (!) 59 77  Resp: (!) 24  18 16   Temp:   98.6 F (37 C) 98.3 F (36.8 C)  TempSrc:   Oral Oral  SpO2: 97%  100% 94%  Weight:  (!) 175 kg    Height:         Intake/Output Summary (Last 24 hours) at 11/15/2021 1055 Last data filed at 11/15/2021 0229 Gross per 24 hour  Intake 360 ml  Output 1200 ml  Net -840 ml   Filed Weights   11/10/21 0424 11/12/21 0607 11/15/21 0500  Weight: (!) 180 kg (!) 176 kg (!) 175 kg    Examination:  General exam: Overall comfortable, not in distress, lying in bed  Data Reviewed: I have personally reviewed following labs and imaging studies  CBC: No results for input(s): "WBC", "NEUTROABS", "HGB", "HCT", "MCV", "PLT" in the last 168 hours.  Basic Metabolic Panel: Recent Labs  Lab 11/14/21 0510  NA 141  K 4.1  CL 103  CO2 31  GLUCOSE 92  BUN 10  CREATININE 0.72  CALCIUM 8.7*  MG 2.0     No results found for this or any previous visit (from the past 240 hour(s)).   Radiology Studies: No results found.  Scheduled Meds:  (feeding supplement) PROSource Plus  30 mL Oral BID BM   carvedilol  3.125 mg Oral BID WC   furosemide  40 mg Oral BID   mouth rinse  15 mL Mouth Rinse 4 times per day   potassium chloride  40 mEq Oral BID   Ensure Max Protein  11 oz Oral BID   rivaroxaban  10 mg Oral Daily   Continuous Infusions:  sodium chloride 10 mL/hr at 11/06/21 0300     LOS: 27 days   Shelly Coss, MD [facility] P9/24/2023, 10:55 AM

## 2021-11-15 NOTE — NC FL2 (Signed)
[place] MEDICAID FL2 LEVEL OF CARE SCREENING TOOL     IDENTIFICATION  Patient Name: Felicia Warren Birthdate: September 28, 1970 Sex: female Admission Date (Current Location): 10/19/2021  [facility] and [place] Number:  Producer, television/film/video and Address:  [facility],  [address], [place] [address]      Provider Number: [id number]  Attending Physician Name and Address:  Burnadette Pop, MD  Relative Name and Phone Number:  Migdalia Dk, [phone]    Current Level of Care: Hospital Recommended Level of Care: Assisted Living Facility Prior Approval Number:    Date Approved/Denied:   PASRR Number: [id number]  Discharge Plan: Other (Comment) (ALF)    Current Diagnoses: Patient Active Problem List   Diagnosis Date Noted   Obesity hypoventilation syndrome and obstructive sleep apnea 11/01/2021   Hypocalcemia 11/01/2021   Class 3 obesity (HCC) 10/24/2021   Acute on chronic diastolic CHF (congestive heart failure) (HCC)    Type 2 diabetes mellitus without complication (HCC)    Urinary tract infection without hematuria    Hypokalemia and hypomagnesemia 06/20/2021   Hypomagnesemia 06/20/2021   Physical deconditioning 06/18/2021   Iron deficiency anemia 06/18/2021   Chronic diastolic CHF (congestive heart failure) (HCC) 06/18/2021   Involuntary commitment 06/17/2021   Self neglect and cognitive impairment 06/17/2021   OSA/OHS on CPAP 06/17/2021   Acute respiratory failure with hypoxia (HCC) 06/16/2021   Acute on chronic respiratory failure with hypoxia and hypercapnia (HCC) 06/16/2021   BMI 60.0-69.9, adult (HCC) 09/17/2020   Prediabetes 09/17/2020   Hypokalemia    Acute metabolic encephalopathy 07/05/2020   History of pulmonary embolism 07/05/2020    Orientation RESPIRATION BLADDER Height & Weight     Self, Time, Situation, Place  Other (Comment) (BIPAP PRN, HS) Continent Weight: (!) 175 kg Height:  5\' 1"  (154.9 cm)  BEHAVIORAL SYMPTOMS/MOOD NEUROLOGICAL  BOWEL NUTRITION STATUS     (n/a) Continent Diet (CHO MOD)  AMBULATORY STATUS COMMUNICATION OF NEEDS Skin   Supervision Verbally Normal                       Personal Care Assistance Level of Assistance  Bathing, Feeding, Dressing Bathing Assistance: Limited assistance Feeding assistance: Independent Dressing Assistance: Limited assistance     Functional Limitations Info  Sight, Hearing, Speech Sight Info: Adequate Hearing Info: Adequate Speech Info: Adequate    SPECIAL CARE FACTORS FREQUENCY   (no follow up)     PT Frequency: 5 x week OT Frequency: 5 x week            Contractures Contractures Info: Not present    Additional Factors Info  Code Status, Allergies Code Status Info:  (Full) Allergies Info:  (NKA) Psychotropic Info: none Insulin Sliding Scale Info:  (MAR)       Current Medications (11/15/2021):  This is the current hospital active medication list Current Facility-Administered Medications  Medication Dose Route Frequency Provider Last Rate Last Admin   (feeding supplement) PROSource Plus liquid 30 mL  30 mL Oral BID BM 11/17/2021, DO   30 mL at 11/07/21 0857   0.9 %  sodium chloride infusion   Intravenous PRN 11/09/21, MD 10 mL/hr at 11/06/21 0300 Infusion Verify at 11/06/21 0300   albuterol (PROVENTIL) (2.5 MG/3ML) 0.083% nebulizer solution 2.5 mg  2.5 mg Nebulization Q4H PRN 11/08/21, DO   2.5 mg at 10/20/21 0247   carvedilol (COREG) tablet 3.125 mg  3.125 mg Oral BID WC Arrien, 10/22/21, MD  3.125 mg at 11/15/21 0820   diphenhydrAMINE-zinc acetate (BENADRYL) 2-0.1 % cream   Topical BID PRN Corey Harold, NP   Given at 10/22/21 1457   docusate sodium (COLACE) capsule 100 mg  100 mg Oral BID PRN Audria Nine, DO       furosemide (LASIX) tablet 40 mg  40 mg Oral BID Darliss Cheney, MD   40 mg at 11/15/21 0820   Oral care mouth rinse  15 mL Mouth Rinse 4 times per day Kathryne Eriksson, NP   15 mL at 11/15/21 1125    Oral care mouth rinse  15 mL Mouth Rinse PRN Kathryne Eriksson, NP       polyethylene glycol (MIRALAX / GLYCOLAX) packet 17 g  17 g Oral Daily PRN Audria Nine, DO       potassium chloride SA (KLOR-CON M) CR tablet 40 mEq  40 mEq Oral BID Edwin Dada, MD   40 mEq at 11/15/21 0820   protein supplement (ENSURE MAX) liquid  11 oz Oral BID Audria Nine, DO   11 oz at 11/15/21 6314   rivaroxaban (XARELTO) tablet 10 mg  10 mg Oral Daily Rigoberto Noel, MD   10 mg at 11/15/21 0820     Discharge Medications: Please see discharge summary for a list of discharge medications.  Relevant Imaging Results:  Relevant Lab Results:   Additional Information SS# [id number]'8";[id number]  Genine Beckett, Juliann Pulse, RN

## 2021-11-16 DIAGNOSIS — J9621 Acute and chronic respiratory failure with hypoxia: Secondary | ICD-10-CM | POA: Diagnosis not present

## 2021-11-16 DIAGNOSIS — J9622 Acute and chronic respiratory failure with hypercapnia: Secondary | ICD-10-CM | POA: Diagnosis not present

## 2021-11-16 MED ORDER — TUBERCULIN PPD 5 UNIT/0.1ML ID SOLN
5.0000 [IU] | Freq: Once | INTRADERMAL | Status: AC
  Administered 2021-11-16: 5 [IU] via INTRADERMAL
  Filled 2021-11-16 (×2): qty 0.1

## 2021-11-16 NOTE — Progress Notes (Signed)
PROGRESS NOTE  Felicia Warren  QMV:[id number] DOB: 09-10-1970 DOA: 10/19/2021 PCP: Elsie Stain, MD   Brief Narrative: Patient is a 51 year old female with history of morbid obesity, OHS/OSA, COPD, diastolic CHF, diabetes type 2 who [place] motel.  On admission she was disoriented, acidotic with PCO2 of 80.  Lab work showed AKI.  She was admitted under PCCM service and was managed for COPD exacerbation.  She failed BiPAP therapy and was intubated on 8/29, extubated on 8/31 and was transferred out of ICU on 9/2.  Currently she is awaiting placement/guardianship.  TOC following.  Medically stable for discharge whenever possible  Assessment & Plan:  Principal Problem:   Acute on chronic respiratory failure with hypoxia and hypercapnia (HCC) Active Problems:   Obesity hypoventilation syndrome and obstructive sleep apnea   Acute on chronic diastolic CHF (congestive heart failure) (HCC)   Type 2 diabetes mellitus without complication (HCC)   Urinary tract infection without hematuria   Class 3 obesity (HCC)   History of pulmonary embolism   Self neglect and cognitive impairment   Hypokalemia and hypomagnesemia   Hypocalcemia  Acute hypoxic respiratory failure: Multifactorial secondary to OSH/OSA/COPD/diastolic CHF.  ABG on presentation showed PCO2 of 80, started on BiPAP.  Has history of OSA but nonadherent to CPAP.  Patient was treated with steroids, bronchodilators initially but respiratory status continued to decline and she was intubated.  Extubated on 8/31.  Currently on room air.  Continue nightly CPAP therapy  Acute on chronic diastolic CHF: Echo shows EF of 60 to 60%, left ventricle hypertrophy.  She is IV diuresed.  Continue oral torsemide  Diabetes type 2: Hemoglobin A1c of 6.2.  Currently on sliding scale insulin.  UTI: Completed course of antibiotics  Hypokalemia/hypomagnesemia/hypocalcemia: Supplemented and resolved  History of PE: On  Xarelto  Self-neglect/cognitive impairment: Psychiatry was consulted during this hospitalization.  Patient does not appear to have capacity to safely manage her complex medical problems.  APS engaged, guardianship is being sought.  She does not appear to be able to take care of herself.  TOC following  Disposition: TOC looking for group home, guardianship pending  Moderate obesity: BMI of 74.9   Nutrition Problem: Inadequate oral intake Etiology: inability to eat    DVT prophylaxis:rivaroxaban (XARELTO) tablet 10 mg Start: 10/23/21 1300 SCDs Start: 10/19/21 2107 rivaroxaban (XARELTO) tablet 10 mg     Code Status: Full Code  Family Communication: None at bedside  Patient status:Inpatient  [facility] is [place] [facility]  Anticipated discharge MW:[facility] Home  Estimated DC date:Not sure.  Medically stable for discharge   Consultants: PCCM  Procedures:Intubation  Antimicrobials:  Anti-infectives (From admission, onward)    Start     Dose/Rate Route Frequency Ordered Stop   10/22/21 2100  cefTRIAXone (ROCEPHIN) 1 g in sodium chloride 0.9 % 100 mL IVPB        1 g 200 mL/hr over 30 Minutes Intravenous Every 24 hours 10/22/21 0748 10/22/21 2203   10/20/21 2100  cefTRIAXone (ROCEPHIN) 1 g in sodium chloride 0.9 % 100 mL IVPB  Status:  Discontinued        1 g 200 mL/hr over 30 Minutes Intravenous Every 24 hours 10/19/21 2118 10/22/21 0748   10/19/21 2100  cefTRIAXone (ROCEPHIN) 2 g in sodium chloride 0.9 % 100 mL IVPB        2 g 200 mL/hr over 30 Minutes Intravenous  Once 10/19/21 2046 10/19/21 2238       Subjective:  Patient seen and  examined at the bedside today.  Hemodynamically stable.  Comfortable without any complaints.  Objective: Vitals:   11/15/21 2328 11/16/21 0444 11/16/21 0500 11/16/21 0747  BP:  (!) 132/59    Pulse: 81 63  (!) 58  Resp: (!) 28 19  16   Temp:  98.1 F (36.7 C)    TempSrc:  Oral    SpO2: 96% 100%  99%  Weight:   (!) 172 kg   Height:         Intake/Output Summary (Last 24 hours) at 11/16/2021 1100 Last data filed at 11/16/2021 0725 Gross per 24 hour  Intake 118 ml  Output 2800 ml  Net -2682 ml   Filed Weights   11/12/21 0607 11/15/21 0500 11/16/21 0500  Weight: (!) 176 kg (!) 175 kg (!) 172 kg    Examination:  General exam: Comfortable, lying in bed without any distress  Data Reviewed: I have personally reviewed following labs and imaging studies  CBC: No results for input(s): "WBC", "NEUTROABS", "HGB", "HCT", "MCV", "PLT" in the last 168 hours.  Basic Metabolic Panel: Recent Labs  Lab 11/14/21 0510  NA 141  K 4.1  CL 103  CO2 31  GLUCOSE 92  BUN 10  CREATININE 0.72  CALCIUM 8.7*  MG 2.0     No results found for this or any previous visit (from the past 240 hour(s)).   Radiology Studies: No results found.  Scheduled Meds:  (feeding supplement) PROSource Plus  30 mL Oral BID BM   carvedilol  3.125 mg Oral BID WC   furosemide  40 mg Oral BID   mouth rinse  15 mL Mouth Rinse 4 times per day   potassium chloride  40 mEq Oral BID   Ensure Max Protein  11 oz Oral BID   rivaroxaban  10 mg Oral Daily   Continuous Infusions:  sodium chloride 10 mL/hr at 11/06/21 0300     LOS: 28 days   11/08/21, MD [facility] P9/25/2023, 11:00 AM

## 2021-11-16 NOTE — Progress Notes (Signed)
Per pt request, pt taken off bipap, pt currently on RA tolerating well, RT will continue to monitor prn

## 2021-11-16 NOTE — Plan of Care (Signed)

## 2021-11-16 NOTE — TOC Progression Note (Addendum)
Transition of Care [facility]) - Progression Note    Patient Details  Name: Felicia Warren MRN: [id number] Date of Birth: 1970-05-02  Transition of Care [facility]) CM/SW Contact  Kendalynn Wideman, Juliann Pulse, RN Phone Number: 11/16/2021, 3:27 PM  Clinical Narrative:  Per supv-[facility]  rep Marya Amsler accepted. MD ordered Tb test await results. [employer] rep Jodell Cipro following for rw already ordered. [name] sister-started guardianship process-she has CPAP for home.Continue to monitor for d/c plans. -3:38p-per [employer] Jodell Cipro patient already has rollator 6/22/3.     Expected Discharge Plan: Assisted Living Barriers to Discharge: Continued Medical Work up  Expected Discharge Plan and Services Expected Discharge Plan: Assisted Living   Discharge Planning Services: CM Consult Post Acute Care Choice:  (ALF) Living arrangements for the past 2 months: Homeless                 DME Arranged: Walker rolling DME Agency: [employer] Date DME Agency Contacted: 11/16/21 Time DME Agency Contacted: [phone] Representative spoke with at DME Agency: Jodell Cipro             Social Determinants of Health ([place]) Interventions Housing Interventions: Inpatient TOC  Readmission Risk Interventions     No data to display

## 2021-11-17 DIAGNOSIS — J9622 Acute and chronic respiratory failure with hypercapnia: Secondary | ICD-10-CM | POA: Diagnosis not present

## 2021-11-17 DIAGNOSIS — J9621 Acute and chronic respiratory failure with hypoxia: Secondary | ICD-10-CM | POA: Diagnosis not present

## 2021-11-17 MED ORDER — INFLUENZA VAC SPLIT QUAD 0.5 ML IM SUSY: 0.5000 mL | PREFILLED_SYRINGE | INTRAMUSCULAR | Status: DC

## 2021-11-17 NOTE — Progress Notes (Addendum)
PROGRESS NOTE  Felicia Warren  FWY:[id number] DOB: 25-Feb-1970 DOA: 10/19/2021 PCP: Elsie Stain, MD   Brief Narrative: Patient is a 51 year old female with history of morbid obesity, OHS/OSA, COPD, diastolic CHF, diabetes type 2 who [place] motel.  On admission she was disoriented, acidotic with PCO2 of 80.  Lab work showed AKI.  She was admitted under PCCM service and was managed for COPD exacerbation.  She failed BiPAP therapy and was intubated on 8/29, extubated on 8/31 and was transferred out of ICU on 9/2.  Currently she is awaiting placement/guardianship.  TOC following.  Medically stable for discharge whenever possible.  As per [facility], she has been accepted at [facility], waiting for PPD result( administered on 9/25)  Assessment & Plan:  Principal Problem:   Acute on chronic respiratory failure with hypoxia and hypercapnia (HCC) Active Problems:   Obesity hypoventilation syndrome and obstructive sleep apnea   Acute on chronic diastolic CHF (congestive heart failure) (HCC)   Type 2 diabetes mellitus without complication (HCC)   Urinary tract infection without hematuria   Class 3 obesity (HCC)   History of pulmonary embolism   Self neglect and cognitive impairment   Hypokalemia and hypomagnesemia   Hypocalcemia  Acute hypoxic respiratory failure: Multifactorial secondary to OSH/OSA/COPD/diastolic CHF.  ABG on presentation showed PCO2 of 80, started on BiPAP.  Has history of OSA but nonadherent to CPAP.  Patient was treated with steroids, bronchodilators initially but respiratory status continued to decline and she was intubated.  Extubated on 8/31.  Currently on room air.  Continue nightly CPAP therapy  Acute on chronic diastolic CHF: Echo shows EF of 60 to 60%, left ventricle hypertrophy.  She was IV diuresed.  Continue Lasix 40 mg twice daily  Diabetes type 2: Hemoglobin A1c of 6.2.  Currently on sliding scale insulin.  UTI: Completed course of  antibiotics  Hypokalemia/hypomagnesemia/hypocalcemia: Stable now.  Continue potassium supplementation  History of PE: On Xarelto  Self-neglect/cognitive impairment: Psychiatry was consulted during this hospitalization.  Patient does not appear to have capacity to safely manage her complex medical problems.  APS engaged, guardianship is being sought.  She does not appear to be able to take care of herself.  TOC following  Disposition: As per TOC, she has been accepted at [facility], waiting for PPD result( administered on 9/25)  Moderate obesity: BMI of 74.9   Nutrition Problem: Inadequate oral intake Etiology: inability to eat    DVT prophylaxis:rivaroxaban (XARELTO) tablet 10 mg Start: 10/23/21 1300 SCDs Start: 10/19/21 2107 rivaroxaban (XARELTO) tablet 10 mg     Code Status: Full Code  Family Communication: None at bedside  Patient status:Inpatient  Patient is [place] [facility]  Anticipated discharge to:ALF  Estimated DC date:.  Whenever bed is available, PPD results needs to be interpreted  Consultants: PCCM  Procedures:Intubation  Antimicrobials:  Anti-infectives (From admission, onward)    Start     Dose/Rate Route Frequency Ordered Stop   10/22/21 2100  cefTRIAXone (ROCEPHIN) 1 g in sodium chloride 0.9 % 100 mL IVPB        1 g 200 mL/hr over 30 Minutes Intravenous Every 24 hours 10/22/21 0748 10/22/21 2203   10/20/21 2100  cefTRIAXone (ROCEPHIN) 1 g in sodium chloride 0.9 % 100 mL IVPB  Status:  Discontinued        1 g 200 mL/hr over 30 Minutes Intravenous Every 24 hours 10/19/21 2118 10/22/21 0748   10/19/21 2100  cefTRIAXone (ROCEPHIN) 2  g in sodium chloride 0.9 % 100 mL IVPB        2 g 200 mL/hr over 30 Minutes Intravenous  Once 10/19/21 2046 10/19/21 2238       Subjective:  Patient seen and examined at the bedside today.  Hemodynamically stable.  No new complaints.  Lying in bed.  Objective: Vitals:   11/16/21 2138 11/16/21 2138 11/17/21 0632  11/17/21 0933  BP: 131/61 131/61 (!) 125/56   Pulse: 72 72 (!) 51 75  Resp: 20 20 16 18   Temp: 98.3 F (36.8 C) 98.3 F (36.8 C) 98.1 F (36.7 C)   TempSrc: Oral Oral Oral   SpO2: 96% 97% 100% 98%  Weight:      Height:        Intake/Output Summary (Last 24 hours) at 11/17/2021 1123 Last data filed at 11/16/2021 2300 Gross per 24 hour  Intake 480 ml  Output 900 ml  Net -420 ml   Filed Weights   11/12/21 0607 11/15/21 0500 11/16/21 0500  Weight: (!) 176 kg (!) 175 kg (!) 172 kg    Examination:  General exam: Overall comfortable, not in distress, morbidly obese HEENT: PERRL Respiratory system:  no wheezes or crackles  Cardiovascular system: S1 & S2 heard, RRR.  Gastrointestinal system: Abdomen is nondistended, soft and nontender. Central nervous system: Alert and oriented Extremities: No edema, no clubbing ,no cyanosis Skin: No rashes, no ulcers,no icterus    Data Reviewed: I have personally reviewed following labs and imaging studies  CBC: No results for input(s): "WBC", "NEUTROABS", "HGB", "HCT", "MCV", "PLT" in the last 168 hours.  Basic Metabolic Panel: Recent Labs  Lab 11/14/21 0510  NA 141  K 4.1  CL 103  CO2 31  GLUCOSE 92  BUN 10  CREATININE 0.72  CALCIUM 8.7*  MG 2.0     No results found for this or any previous visit (from the past 240 hour(s)).   Radiology Studies: No results found.  Scheduled Meds:  (feeding supplement) PROSource Plus  30 mL Oral BID BM   carvedilol  3.125 mg Oral BID WC   furosemide  40 mg Oral BID   mouth rinse  15 mL Mouth Rinse 4 times per day   potassium chloride  40 mEq Oral BID   Ensure Max Protein  11 oz Oral BID   rivaroxaban  10 mg Oral Daily   tuberculin  5 Units Intradermal Once   Continuous Infusions:  sodium chloride 10 mL/hr at 11/06/21 0300     LOS: 29 days   11/08/21, MD [facility] P9/26/2023, 11:23 AM

## 2021-11-17 NOTE — Progress Notes (Signed)
Mobility Specialist - Progress Note   11/17/21 0953  Mobility  HOB Elevated/Bed Position Self regulated  Activity Ambulated with assistance in hallway  Range of Motion/Exercises Active  Level of Assistance Modified independent, requires aide device or extra time  Assistive Device Front wheel walker  Distance Ambulated (ft) 500 ft  Activity Response Tolerated well  Transport method Ambulatory  $Mobility charge 1 Mobility   Pt received in recliner and agreeable to mobility. No complaints during ambulation. Pt to recliner after session with all needs met.     [facility]

## 2021-11-17 NOTE — TOC Progression Note (Signed)
Transition of Care [facility]) - Progression Note    Patient Details  Name: Felicia Warren MRN: [id number] Date of Birth: 04/22/1970  Transition [facility]) CM/SW Contact  Joaquin Courts, RN Phone Number: 11/17/2021, 1:47 PM  Clinical Narrative:    CM spoke with Adella Nissen with DSS guardianship intake who confirms she assisted patient's sister with completing guardianship documents for court filing, Mrs Ouida Sills confirms that sister will not be taking guardianship and anticipates that DSS will ultimately be appointed by the court, reports she believes a hearing is scheduled for tomorrow for a guardian at litem to be appointed.  Mrs Ouida Sills report Wayna Chalet with DSS is overseeing this case and she will request that [facility] contact this CM.  CM also spoke with [name], patient's sister who has agreed to assist with placement until a guardian is assigned.  Jenny Reichmann confirmed that is temporary guardian is not assigned during the hearing tomorrow, she is willing to complete the necessary paperwork for patient to go to [facility].  This CM will follow up with Jenny Reichmann and Wayna Chalet tomorrow for updates on hearing outcomes.     Expected Discharge Plan: Assisted Living Barriers to Discharge: Continued Medical Work up  Expected Discharge Plan and Services Expected Discharge Plan: Assisted Living   Discharge Planning Services: CM Consult Post Acute Care Choice:  (ALF) Living arrangements for the past 2 months: Homeless                 DME Arranged: Walker rolling DME Agency: [employer] Date DME Agency Contacted: 11/16/21 Time DME Agency Contacted: [phone] Representative spoke with at DME Agency: Jodell Cipro             Social Determinants of Health ([place]) Interventions Housing Interventions: Inpatient TOC  Readmission Risk Interventions     No data to display

## 2021-11-17 NOTE — Plan of Care (Signed)

## 2021-11-17 NOTE — Progress Notes (Signed)
Bipap on standby 

## 2021-11-18 DIAGNOSIS — J9622 Acute and chronic respiratory failure with hypercapnia: Secondary | ICD-10-CM | POA: Diagnosis not present

## 2021-11-18 DIAGNOSIS — J9621 Acute and chronic respiratory failure with hypoxia: Secondary | ICD-10-CM | POA: Diagnosis not present

## 2021-11-18 LAB — BASIC METABOLIC PANEL
Anion gap: 6 (ref 5–15)
BUN: 16 mg/dL (ref 6–20)
CO2: 32 mmol/L (ref 22–32)
Calcium: 8.8 mg/dL — ABNORMAL LOW (ref 8.9–10.3)
Chloride: 103 mmol/L (ref 98–111)
Creatinine, Ser: 0.83 mg/dL (ref 0.44–1.00)
GFR, Estimated: >60 mL/min (ref >60)
Glucose, Bld: 101 mg/dL — ABNORMAL HIGH (ref 70–99)
Potassium: 4.3 mmol/L (ref 3.5–5.1)
Sodium: 141 mmol/L (ref 135–145)

## 2021-11-18 LAB — MAGNESIUM: Magnesium: 2 mg/dL (ref 1.7–2.4)

## 2021-11-18 MED ORDER — DIPHENHYDRAMINE HCL 25 MG PO CAPS
25.0000 mg | ORAL_CAPSULE | Freq: Three times a day (TID) | ORAL | Status: DC | PRN
  Administered 2021-11-18 – 2021-11-24 (×10): 25 mg via ORAL
  Filled 2021-11-18 (×10): qty 1

## 2021-11-18 NOTE — Progress Notes (Signed)
PROGRESS NOTE  Felicia Warren  IDP:[id number] DOB: [birth year] DOA: 10/19/2021 PCP: Elsie Stain, MD   Brief Narrative: Patient is a 51 year old female with history of morbid obesity, OHS/OSA, COPD, diastolic CHF, diabetes type 2 who [place] motel.  On admission she was disoriented, acidotic with PCO2 of 80.  Lab work showed AKI.  She was admitted under PCCM service and was managed for COPD exacerbation.  She failed BiPAP therapy and was intubated on 8/29, extubated on 8/31 and was transferred out of ICU on 9/2.  Currently she is awaiting placement/guardianship.  TOC following.  Medically stable for discharge whenever possible.  As per TOC, she has been accepted at [facility].guardianship pending.  TOC will let us know when she is ready for discharge.  Assessment & Plan:  Principal Problem:   Acute on chronic respiratory failure with hypoxia and hypercapnia (HCC) Active Problems:   Obesity hypoventilation syndrome and obstructive sleep apnea   Acute on chronic diastolic CHF (congestive heart failure) (HCC)   Type 2 diabetes mellitus without complication (HCC)   Urinary tract infection without hematuria   Class 3 obesity (HCC)   History of pulmonary embolism   Self neglect and cognitive impairment   Hypokalemia and hypomagnesemia   Hypocalcemia  Acute hypoxic respiratory failure: Multifactorial secondary to OSH/OSA/COPD/diastolic CHF.  ABG on presentation showed PCO2 of 80, started on BiPAP.  Has history of OSA but nonadherent to CPAP.  Patient was treated with steroids, bronchodilators initially but respiratory status continued to decline and she was intubated.  Extubated on 8/31.  Currently on room air.  Continue nightly CPAP therapy  Acute on chronic diastolic CHF: Echo shows EF of 60 to 60%, left ventricle hypertrophy.  She was IV diuresed.  Continue Lasix 40 mg twice daily  Diabetes type 2: Hemoglobin A1c of 6.2.  Currently on sliding scale insulin.  UTI:  Completed course of antibiotics  Hypokalemia/hypomagnesemia/hypocalcemia: Stable now.  Continue potassium supplementation  History of PE: On Xarelto  Self-neglect/cognitive impairment: Psychiatry was consulted during this hospitalization.  Patient does not appear to have capacity to safely manage her complex medical problems.  APS engaged, guardianship is being sought.  She does not appear to be able to take care of herself.  TOC following  Disposition: As per TOC, she has been accepted at [facility].PPD negative( administered on 9/25).  Pending guardianship  Moderate obesity: BMI of 74.9   Nutrition Problem: Inadequate oral intake Etiology: inability to eat    DVT prophylaxis:rivaroxaban (XARELTO) tablet 10 mg Start: 10/23/21 1300 SCDs Start: 10/19/21 2107 rivaroxaban (XARELTO) tablet 10 mg     Code Status: Full Code  Family Communication: None at bedside  Patient status:Inpatient  [facility]  Anticipated discharge to:ALF  Estimated DC date:.  Whenever bed is available  Consultants: PCCM  Procedures:Intubation  Antimicrobials:  Anti-infectives (From admission, onward)    Start     Dose/Rate Route Frequency Ordered Stop   10/22/21 2100  cefTRIAXone (ROCEPHIN) 1 g in sodium chloride 0.9 % 100 mL IVPB        1 g 200 mL/hr over 30 Minutes Intravenous Every 24 hours 10/22/21 0748 10/22/21 2203   10/20/21 2100  cefTRIAXone (ROCEPHIN) 1 g in sodium chloride 0.9 % 100 mL IVPB  Status:  Discontinued        1 g 200 mL/hr over 30 Minutes Intravenous Every 24 hours 10/19/21 2118 10/22/21 0748   10/19/21 2100  cefTRIAXone (ROCEPHIN) 2  g in sodium chloride 0.9 % 100 mL IVPB        2 g 200 mL/hr over 30 Minutes Intravenous  Once 10/19/21 2046 10/19/21 2238       Subjective:  Patient seen and examined at bedside today.  Hemodynamically stable , comfortable, without any complaints.  Objective: Vitals:   11/17/21 1400 11/17/21 2302 11/18/21 0000  11/18/21 1000  BP: (!) 125/48 (!) 89/50 (!) 103/51 109/64  Pulse: 60 70  62  Resp:  20  18  Temp: 97.9 F (36.6 C) 98.1 F (36.7 C)  98 F (36.7 C)  TempSrc: Oral Oral  Oral  SpO2: 99% 100%  99%  Weight:      Height:       No intake or output data in the 24 hours ending 11/18/21 1151  Filed Weights   11/12/21 0607 11/15/21 0500 11/16/21 0500  Weight: (!) 176 kg (!) 175 kg (!) 172 kg    Examination:  General exam: Comfortable, lying in bed, playing with phone.  No new questions  Data Reviewed: I have personally reviewed following labs and imaging studies  CBC: No results for input(s): "WBC", "NEUTROABS", "HGB", "HCT", "MCV", "PLT" in the last 168 hours.  Basic Metabolic Panel: Recent Labs  Lab 11/14/21 0510 11/18/21 0515  NA 141 141  K 4.1 4.3  CL 103 103  CO2 31 32  GLUCOSE 92 101*  BUN 10 16  CREATININE 0.72 0.83  CALCIUM 8.7* 8.8*  MG 2.0 2.0     No results found for this or any previous visit (from the past 240 hour(s)).   Radiology Studies: No results found.  Scheduled Meds:  (feeding supplement) PROSource Plus  30 mL Oral BID BM   carvedilol  3.125 mg Oral BID WC   furosemide  40 mg Oral BID   influenza vac split quadrivalent PF  0.5 mL Intramuscular Tomorrow-1000   mouth rinse  15 mL Mouth Rinse 4 times per day   potassium chloride  40 mEq Oral BID   Ensure Max Protein  11 oz Oral BID   rivaroxaban  10 mg Oral Daily   tuberculin  5 Units Intradermal Once   Continuous Infusions:  sodium chloride 10 mL/hr at 11/06/21 0300     LOS: 30 days   Burnadette Pop, MD [facility] P9/27/2023, 11:51 AM

## 2021-11-18 NOTE — Progress Notes (Signed)
Mobility Specialist - Progress Note   11/18/21 1352  Mobility  HOB Elevated/Bed Position Self regulated  Activity Ambulated with assistance in hallway  Range of Motion/Exercises Active  Level of Assistance Modified independent, requires aide device or extra time  Assistive Device Wheelchair;Front wheel walker  Distance Ambulated (ft) 250 ft  Activity Response Tolerated well  Transport method Ambulatory  $Mobility charge 1 Mobility   Pt received in bed and agreeable to mobility. Assisted pt w/ arm exercises outside. No complaints during session. Pt to bed after session with all needs met.     [facility]

## 2021-11-18 NOTE — Progress Notes (Signed)
Mobility Specialist - Progress Note   11/18/21 0948  Mobility  HOB Elevated/Bed Position Self regulated  Activity Ambulated with assistance in hallway  Range of Motion/Exercises Active  Level of Assistance Modified independent, requires aide device or extra time  Assistive Device Front wheel walker  Distance Ambulated (ft) 500 ft  Activity Response Tolerated well  Transport method Ambulatory  $Mobility charge 1 Mobility   Pt received in bed and agreeable to mobility. No complaints during mobility. Pt to recliner after session with all needs met.     [facility]

## 2021-11-18 NOTE — TOC Progression Note (Signed)
Transition of Care [facility]) - Progression Note    Patient Details  Name: Felicia Warren MRN: [id number] Date of Birth: 07-23-1970  Transition of Care [facility]) CM/SW Contact  Joaquin Courts, RN Phone Number: 11/18/2021, 4:45 PM  Clinical Narrative:    VM left for DSS csw and patient's sister requesting call back.   Expected Discharge Plan: Assisted Living Barriers to Discharge: Continued Medical Work up  Expected Discharge Plan and Services Expected Discharge Plan: Assisted Living   Discharge Planning Services: CM Consult Post Acute Care Choice:  (ALF) Living arrangements for the past 2 months: Homeless                 DME Arranged: Walker rolling DME Agency: [employer] Date DME Agency Contacted: 11/16/21 Time DME Agency Contacted: [phone] Representative spoke with at DME Agency: Jodell Cipro             Social Determinants of Health ([place]) Interventions Housing Interventions: Inpatient TOC  Readmission Risk Interventions     No data to display

## 2021-11-19 DIAGNOSIS — J9622 Acute and chronic respiratory failure with hypercapnia: Secondary | ICD-10-CM | POA: Diagnosis not present

## 2021-11-19 DIAGNOSIS — J9621 Acute and chronic respiratory failure with hypoxia: Secondary | ICD-10-CM | POA: Diagnosis not present

## 2021-11-19 NOTE — Progress Notes (Signed)
Mobility Specialist - Progress Note   11/19/21 1410  Mobility  HOB Elevated/Bed Position Self regulated  Activity Ambulated with assistance in hallway  Range of Motion/Exercises Right arm;Left arm;Right leg;Left leg;Active  Level of Assistance Modified independent, requires aide device or extra time  [employer] wheel walker;Wheelchair  Distance Ambulated (ft) 250 ft  Activity Response Tolerated well  Transport method Ambulatory  $Mobility charge 1 Mobility   Pt received in bed and agreeable to mobility. Took pt outside & assisted w/ theraband arm/ leg exercises. Pt tp reclined at EOS with all necessities in reach.    [facility]

## 2021-11-19 NOTE — Progress Notes (Signed)
PROGRESS NOTE    Felicia Warren  [id number] DOB: [birth year] DOA: 10/19/2021 PCP: Elsie Stain, MD    Brief Narrative:   Felicia Warren is a 51 y.o. female with past medical history significant for morbid obesity, OHS/OSA, COPD, chronic diastolic congestive heart failure, type 2 diabetes mellitus, who presented to [facility] from a motel on 10/19/2021 with altered mental status and adult failure to thrive.  Upon presentation, patient was noted to be disoriented, acidotic with a PCO2 of 80.  Lab work notable for acute renal failure and she was initially admitted under the PCCM service and managed for COPD/CHF exacerbation.  She was initially placed on BiPAP in which she failed and was subsequently intubated and placed on ventilatory support on 10/20/2021.  Patient was successfully extubated on 10/22/2021 and transferred out of the ICU on 10/24/2021 under the hospitalist service.  Social work following, awaiting placement/guardianship.    Medically stable for discharge whenever guardianship obtained and currently has been accepted at [facility] on discharge.  TOC will let us know when she is ready for discharge.  Assessment & Plan:   Acute hypoxic respiratory failure with hypercapnia Etiology multifactorial secondary to OHS/OSA/COPD/diastolic congestive heart failure exacerbation.  ABG on presentation showed a PCO2 of 80, will start on BiPAP; but continued to deteriorate and subsequently was intubated and placed on ventilatory support.  She was treated with steroids, bronchodilators, diuretics with improvement of respiratory status and was successfully extubated on 10/22/2021.  Currently on room air. --Continue nocturnal CPAP.  Acute on chronic diastolic congestive heart failure TTE with LVEF 60-65%, LVH.  During her ICU stay, patient received IV furosemide which is now transition to furosemide 40 g p.o. twice daily.  Continue to monitor daily weights, strict I's and O's.  Currently oxygenating  well on room air.  Type 2 diabetes mellitus Hemoglobin A1c 6.2, well controlled.  Continue diet control.  Urinary tract infection Completed course of antibiotics.  Hypokalemia Hypomagnesemia Hypocalcemia Repleted during hospitalization, stable.  History of PE: Continue Xarelto  Cognitive impairment/self-neglect: Psychiatry was consulted in the hospitalization, patient does not appear to have capacity to safely manage her complex medical problems.  APS engaged, guardianship is pending.  TOC following.  Super morbid obesity Body mass index is 71.65 kg/m.  Discussed with patient needs for aggressive lifestyle changes/weight loss as this complicates all facets of care.  Outpatient follow-up with PCP.  May benefit from bariatric evaluation outpatient.   DVT prophylaxis: rivaroxaban (XARELTO) tablet 10 mg Start: 10/23/21 1300 SCDs Start: 10/19/21 2107 rivaroxaban (XARELTO) tablet 10 mg    Code Status: Full Code Family Communication: No family present at bedside this morning  Disposition Plan:  Level of care: Med-Surg Status is: Inpatient Remains inpatient appropriate because: Pending guardianship with plan discharge to ALF    Consultants:  Psychiatry PCCM  Procedures:  TTE  Antimicrobials:  Ceftriaxone 8/28 - 8/31   Subjective: Patient seen and examined at bedside, resting comfortably.  Sleeping and remains on nocturnal CPAP.  Awakens with no complaints this morning.  Still awaiting guardianship for placement in ALF.  Denies headache, no chest pain, no shortness of breath, no abdominal pain.  No acute events overnight per nursing staff.  Objective: Vitals:   11/18/21 2100 11/19/21 0645 11/19/21 0851 11/19/21 1219  BP: 123/65 (!) 107/46 118/63 (!) 92/52  Pulse: 80 73 91 72  Resp: 18 20  18   Temp: 98.2 F (36.8 C) 99.2 F (37.3 C)  99 F (37.2 C)  TempSrc:  Oral Oral  Oral  SpO2: 100% 100% 99% 100%  Weight:      Height:        Intake/Output Summary (Last 24  hours) at 11/19/2021 1223 Last data filed at 11/19/2021 3244 Gross per 24 hour  Intake 360 ml  Output 600 ml  Net -240 ml   Filed Weights   11/12/21 0607 11/15/21 0500 11/16/21 0500  Weight: (!) 176 kg (!) 175 kg (!) 172 kg    Examination:  Physical Exam: GEN: NAD, alert and oriented x 3, morbidly obese HEENT: NCAT, PERRL, EOMI, sclera clear, MMM PULM: CTAB w/o wheezes/crackles, normal respiratory effort, on CPAP CV: RRR w/o M/G/R GI: abd soft, NTND, NABS, no R/G/M MSK: no peripheral edema, moves all extremities independently Integumentary: dry/intact, no rashes or wounds    Data Reviewed: I have personally reviewed following labs and imaging studies  CBC: No results for input(s): "WBC", "NEUTROABS", "HGB", "HCT", "MCV", "PLT" in the last 168 hours. Basic Metabolic Panel: Recent Labs  Lab 11/14/21 0510 11/18/21 0515  NA 141 141  K 4.1 4.3  CL 103 103  CO2 31 32  GLUCOSE 92 101*  BUN 10 16  CREATININE 0.72 0.83  CALCIUM 8.7* 8.8*  MG 2.0 2.0   GFR: Estimated Creatinine Clearance: 120.7 mL/min (by C-G formula based on SCr of 0.83 mg/dL). Liver Function Tests: No results for input(s): "AST", "ALT", "ALKPHOS", "BILITOT", "PROT", "ALBUMIN" in the last 168 hours. No results for input(s): "LIPASE", "AMYLASE" in the last 168 hours. No results for input(s): "AMMONIA" in the last 168 hours. Coagulation Profile: No results for input(s): "INR", "PROTIME" in the last 168 hours. Cardiac Enzymes: No results for input(s): "CKTOTAL", "CKMB", "CKMBINDEX", "TROPONINI" in the last 168 hours. BNP (last 3 results) No results for input(s): "PROBNP" in the last 8760 hours. HbA1C: No results for input(s): "HGBA1C" in the last 72 hours. CBG: No results for input(s): "GLUCAP" in the last 168 hours. Lipid Profile: No results for input(s): "CHOL", "HDL", "LDLCALC", "TRIG", "CHOLHDL", "LDLDIRECT" in the last 72 hours. Thyroid Function Tests: No results for input(s): "TSH", "T4TOTAL",  "FREET4", "T3FREE", "THYROIDAB" in the last 72 hours. Anemia Panel: No results for input(s): "VITAMINB12", "FOLATE", "FERRITIN", "TIBC", "IRON", "RETICCTPCT" in the last 72 hours. Sepsis Labs: No results for input(s): "PROCALCITON", "LATICACIDVEN" in the last 168 hours.  No results found for this or any previous visit (from the past 240 hour(s)).       Radiology Studies: No results found.      Scheduled Meds:  (feeding supplement) PROSource Plus  30 mL Oral BID BM   carvedilol  3.125 mg Oral BID WC   furosemide  40 mg Oral BID   influenza vac split quadrivalent PF  0.5 mL Intramuscular Tomorrow-1000   mouth rinse  15 mL Mouth Rinse 4 times per day   potassium chloride  40 mEq Oral BID   Ensure Max Protein  11 oz Oral BID   rivaroxaban  10 mg Oral Daily   Continuous Infusions:  sodium chloride 10 mL/hr at 11/06/21 0300     LOS: 31 days    Time spent: 41 minutes spent on chart review, discussion with nursing staff, consultants, updating family and interview/physical exam; more than 50% of that time was spent in counseling and/or coordination of care.    Afsheen Antony J [place] DO [facility] Available via Epic secure chat 7am-7pm After these hours, please refer to coverage provider listed on [email or web address] 11/19/2021, 12:23 PM

## 2021-11-19 NOTE — Progress Notes (Signed)
Mobility Specialist - Progress Note   11/19/21 0944  Mobility  HOB Elevated/Bed Position Self regulated  Activity Ambulated with assistance in hallway  Range of Motion/Exercises Active  Level of Assistance Modified independent, requires aide device or extra time  Assistive Device Front wheel walker  Distance Ambulated (ft) 500 ft  Activity Response Tolerated well  Transport method Ambulatory  $Mobility charge 1 Mobility   Pt received in recliner and agreeable to mobility. No complaints during mobility. Pt to bed after session with all needs met.    [facility]

## 2021-11-20 DIAGNOSIS — J9622 Acute and chronic respiratory failure with hypercapnia: Secondary | ICD-10-CM | POA: Diagnosis not present

## 2021-11-20 DIAGNOSIS — J9621 Acute and chronic respiratory failure with hypoxia: Secondary | ICD-10-CM | POA: Diagnosis not present

## 2021-11-20 NOTE — TOC Progression Note (Signed)
Transition of Care [facility]) - Progression Note    Patient Details  Name: Felicia Warren MRN: [id number] Date of Birth: 12/10/1970  [facility] CM/SW Contact  Joaquin Courts, RN Phone Number: 11/20/2021, 3:44 PM  Clinical Narrative:    CM spoke with Kevin Fenton, program manager DSS, who confirms that court hearing did occur on 11/18/21.  The judge did decline the petition, Marveen Reeks ruled that patient has the ability to make her own decisions.     Expected Discharge Plan: Assisted Living Barriers to Discharge: Continued Medical Work up  Expected Discharge Plan and Services Expected Discharge Plan: Assisted Living   Discharge Planning Services: CM Consult Post Acute Care Choice:  (ALF) Living arrangements for the past 2 months: Homeless                 DME Arranged: Walker rolling DME Agency: [employer] Date DME Agency Contacted: 11/16/21 Time DME Agency Contacted: [phone] Representative spoke with at DME Agency: Jodell Cipro             Social Determinants of Health ([place]) Interventions Housing Interventions: Inpatient TOC  Readmission Risk Interventions     No data to display

## 2021-11-20 NOTE — TOC Progression Note (Signed)
Transition of Care [facility]) - Progression Note    Patient Details  Name: Felicia Warren MRN: [id number] Date of Birth: Apr 11, 1970  Transition of Care [facility]) CM/SW Contact  Joaquin Courts, RN Phone Number: 11/20/2021, 10:36 AM  Clinical Narrative:    CM spoke with patient's sister [name], who reports that the motion for guardianship has been denied  however she believes that there is another hearing scheduled for December.  This CM has left VM for DSS CSW and program manager to verify this outcome.  [name] reports that in light of this outcome, she will be unable to assist going forward.    Expected Discharge Plan: Assisted Living Barriers to Discharge: Continued Medical Work up  Expected Discharge Plan and Services Expected Discharge Plan: Assisted Living   Discharge Planning Services: CM Consult Post Acute Care Choice:  (ALF) Living arrangements for the past 2 months: Homeless                 DME Arranged: Walker rolling DME Agency: [employer] Date DME Agency Contacted: 11/16/21 Time DME Agency Contacted: [phone] Representative spoke with at DME Agency: Jodell Cipro             Social Determinants of Health ([place]) Interventions Housing Interventions: Inpatient TOC  Readmission Risk Interventions     No data to display

## 2021-11-20 NOTE — Progress Notes (Signed)
PROGRESS NOTE    Felicia Warren  KXF:[id number] DOB: [birth year] DOA: 10/19/2021 PCP: Storm Frisk, MD    Brief Narrative:   Felicia Warren is a 51 y.o. female with past medical history significant for morbid obesity, OHS/OSA, COPD, chronic diastolic congestive heart failure, type 2 diabetes mellitus, who presented to [facility] from a motel on 10/19/2021 with altered mental status and adult failure to thrive.  Upon presentation, patient was noted to be disoriented, acidotic with a PCO2 of 80.  Lab work notable for acute renal failure and she was initially admitted under the PCCM service and managed for COPD/CHF exacerbation.  She was initially placed on BiPAP in which she failed and was subsequently intubated and placed on ventilatory support on 10/20/2021.  Patient was successfully extubated on 10/22/2021 and transferred out of the ICU on 10/24/2021 under the hospitalist service.  Social work following, awaiting placement/guardianship.    Medically stable for discharge whenever guardianship obtained and currently has been accepted at [facility] on discharge.  TOC will let us know when she is ready for discharge.  Assessment & Plan:   Acute hypoxic respiratory failure with hypercapnia Etiology multifactorial secondary to OHS/OSA/COPD/diastolic congestive heart failure exacerbation.  ABG on presentation showed a PCO2 of 80, will start on BiPAP; but continued to deteriorate and subsequently was intubated and placed on ventilatory support.  She was treated with steroids, bronchodilators, diuretics with improvement of respiratory status and was successfully extubated on 10/22/2021.  Currently on room air. --Continue nocturnal CPAP.  Acute on chronic diastolic congestive heart failure TTE with LVEF 60-65%, LVH.  During her ICU stay, patient received IV furosemide which is now transition to furosemide 40 g p.o. twice daily.  Continue to monitor daily weights, strict I's and O's.  Currently oxygenating  well on room air.  Type 2 diabetes mellitus Hemoglobin A1c 6.2, well controlled.  Continue diet control.  Urinary tract infection Completed course of antibiotics.  Hypokalemia Hypomagnesemia Hypocalcemia Repleted during hospitalization, stable.  History of PE: Continue Xarelto  Cognitive impairment/self-neglect: Psychiatry was consulted in the hospitalization, patient does not appear to have capacity to safely manage her complex medical problems.  APS engaged, guardianship is pending.  TOC following.  Super morbid obesity Body mass index is 71.65 kg/m.  Discussed with patient needs for aggressive lifestyle changes/weight loss as this complicates all facets of care.  Outpatient follow-up with PCP.  May benefit from bariatric evaluation outpatient.   DVT prophylaxis: rivaroxaban (XARELTO) tablet 10 mg Start: 10/23/21 1300 SCDs Start: 10/19/21 2107 rivaroxaban (XARELTO) tablet 10 mg    Code Status: Full Code Family Communication: No family present at bedside this morning  Disposition Plan:  Level of care: Med-Surg Status is: Inpatient Remains inpatient appropriate because: Pending guardianship with plan discharge to ALF    Consultants:  Psychiatry PCCM  Procedures:  TTE  Antimicrobials:  Ceftriaxone 8/28 - 8/31   Subjective: Patient seen and examined at bedside, resting comfortably.  Sleeping on CPAP.  No complaints this morning.  Awaiting guardianship.  Denies headache, no chest pain, no shortness of breath, no abdominal pain.  No acute events overnight per nursing staff.  Objective: Vitals:   11/19/21 2028 11/19/21 2115 11/19/21 2256 11/20/21 0407  BP: (!) 89/44 (!) 108/48 (!) 109/49 123/61  Pulse: 84 87 82 67  Resp:   16 20  Temp:    98.9 F (37.2 C)  TempSrc:      SpO2: 94% 95% 100% 100%  Weight:  Height:        Intake/Output Summary (Last 24 hours) at 11/20/2021 1123 Last data filed at 11/20/2021 0600 Gross per 24 hour  Intake 240 ml  Output 1300  ml  Net -1060 ml   Filed Weights   11/12/21 0607 11/15/21 0500 11/16/21 0500  Weight: (!) 176 kg (!) 175 kg (!) 172 kg    Examination:  Physical Exam: GEN: NAD, alert and oriented x 3, morbidly obese HEENT: NCAT, PERRL, EOMI, sclera clear, MMM PULM: CTAB w/o wheezes/crackles, normal respiratory effort, on CPAP CV: RRR w/o M/G/R GI: abd soft, NTND, NABS, no R/G/M MSK: no peripheral edema, moves all extremities independently Integumentary: dry/intact, no rashes or wounds    Data Reviewed: I have personally reviewed following labs and imaging studies  CBC: No results for input(s): "WBC", "NEUTROABS", "HGB", "HCT", "MCV", "PLT" in the last 168 hours. Basic Metabolic Panel: Recent Labs  Lab 11/14/21 0510 11/18/21 0515  NA 141 141  K 4.1 4.3  CL 103 103  CO2 31 32  GLUCOSE 92 101*  BUN 10 16  CREATININE 0.72 0.83  CALCIUM 8.7* 8.8*  MG 2.0 2.0   GFR: Estimated Creatinine Clearance: 120.7 mL/min (by C-G formula based on SCr of 0.83 mg/dL). Liver Function Tests: No results for input(s): "AST", "ALT", "ALKPHOS", "BILITOT", "PROT", "ALBUMIN" in the last 168 hours. No results for input(s): "LIPASE", "AMYLASE" in the last 168 hours. No results for input(s): "AMMONIA" in the last 168 hours. Coagulation Profile: No results for input(s): "INR", "PROTIME" in the last 168 hours. Cardiac Enzymes: No results for input(s): "CKTOTAL", "CKMB", "CKMBINDEX", "TROPONINI" in the last 168 hours. BNP (last 3 results) No results for input(s): "PROBNP" in the last 8760 hours. HbA1C: No results for input(s): "HGBA1C" in the last 72 hours. CBG: No results for input(s): "GLUCAP" in the last 168 hours. Lipid Profile: No results for input(s): "CHOL", "HDL", "LDLCALC", "TRIG", "CHOLHDL", "LDLDIRECT" in the last 72 hours. Thyroid Function Tests: No results for input(s): "TSH", "T4TOTAL", "FREET4", "T3FREE", "THYROIDAB" in the last 72 hours. Anemia Panel: No results for input(s): "VITAMINB12",  "FOLATE", "FERRITIN", "TIBC", "IRON", "RETICCTPCT" in the last 72 hours. Sepsis Labs: No results for input(s): "PROCALCITON", "LATICACIDVEN" in the last 168 hours.  No results found for this or any previous visit (from the past 240 hour(s)).       Radiology Studies: No results found.      Scheduled Meds:  (feeding supplement) PROSource Plus  30 mL Oral BID BM   furosemide  40 mg Oral BID   influenza vac split quadrivalent PF  0.5 mL Intramuscular Tomorrow-1000   mouth rinse  15 mL Mouth Rinse 4 times per day   potassium chloride  40 mEq Oral BID   Ensure Max Protein  11 oz Oral BID   rivaroxaban  10 mg Oral Daily   Continuous Infusions:  sodium chloride 10 mL/hr at 11/06/21 0300     LOS: 32 days    Time spent: 41 minutes spent on chart review, discussion with nursing staff, consultants, updating family and interview/physical exam; more than 50% of that time was spent in counseling and/or coordination of care.    Sheva Mcdougle J [place] DO [facility] Available via Epic secure chat 7am-7pm After these hours, please refer to coverage provider listed on [email or web address] 11/20/2021, 11:23 AM

## 2021-11-20 NOTE — Progress Notes (Signed)
Mobility Specialist - Progress Note   11/20/21 0928  Mobility  HOB Elevated/Bed Position Self regulated  Activity Ambulated with assistance in hallway  Range of Motion/Exercises Active  Level of Assistance Modified independent, requires aide device or extra time  Assistive Device Cane  Distance Ambulated (ft) 500 ft  Activity Response Tolerated well  Transport method Ambulatory  $Mobility charge 1 Mobility   Pt received in bathroom and agreeable to mobility. Pt chose to walk w/ cane today & seemed to have no difficulties with that. No complaints during ambulation. Pt to recliner after session with all needs met & nurse in room.    [facility]

## 2021-11-20 NOTE — Progress Notes (Signed)
Mobility Specialist - Progress Note   11/20/21 1449  Mobility  HOB Elevated/Bed Position Self regulated  Activity Ambulated with assistance in hallway  Range of Motion/Exercises Active;Left leg;Right leg;Left arm;Right arm  Level of Assistance Modified independent, requires aide device or extra time  Assistive Device Wheelchair;Cane  Distance Ambulated (ft) 650 ft  Activity Response Tolerated well  Transport method Ambulatory  $Mobility charge 1 Mobility   Pt received in recliner and agreeable to mobility. Took pt outside to do theraband exercises for her legs & arms. Pt ambulated from her room to the lobby. Pt wheeled back into the unit in a wheelchair where she then ambulated with her can in the hallway. No complaints during mobility. Pt to recliner after session with all needs met.    [facility]

## 2021-11-21 DIAGNOSIS — J9621 Acute and chronic respiratory failure with hypoxia: Secondary | ICD-10-CM | POA: Diagnosis not present

## 2021-11-21 DIAGNOSIS — J9622 Acute and chronic respiratory failure with hypercapnia: Secondary | ICD-10-CM | POA: Diagnosis not present

## 2021-11-21 NOTE — Progress Notes (Signed)
PROGRESS NOTE    Felicia Warren  OJJ:[id number] DOB: [birth year] DOA: 10/19/2021 PCP: Storm Frisk, MD    Brief Narrative:   Felicia Warren is a 51 y.o. female with past medical history significant for morbid obesity, OHS/OSA, COPD, chronic diastolic congestive heart failure, type 2 diabetes mellitus, who presented to [facility] from a motel on 10/19/2021 with altered mental status and adult failure to thrive.  Upon presentation, patient was noted to be disoriented, acidotic with a PCO2 of 80.  Lab work notable for acute renal failure and she was initially admitted under the PCCM service and managed for COPD/CHF exacerbation.  She was initially placed on BiPAP in which she failed and was subsequently intubated and placed on ventilatory support on 10/20/2021.  Patient was successfully extubated on 10/22/2021 and transferred out of the ICU on 10/24/2021 under the hospitalist service.  Social work following, per APS/DSS judge ruled on 11/18/2021 declining petition for guardianship with ruling that patient has ability to make her own decisions.  Medically stable for [facility] once bed available.  TOC will let us know when she is ready for discharge.  Assessment & Plan:   Acute hypoxic respiratory failure with hypercapnia Etiology multifactorial secondary to OHS/OSA/COPD/diastolic congestive heart failure exacerbation.  ABG on presentation showed a PCO2 of 80, will start on BiPAP; but continued to deteriorate and subsequently was intubated and placed on ventilatory support.  She was treated with steroids, bronchodilators, diuretics with improvement of respiratory status and was successfully extubated on 10/22/2021.  Currently on room air. --Continue nocturnal CPAP.  Acute on chronic diastolic congestive heart failure TTE with LVEF 60-65%, LVH.  During her ICU stay, patient received IV furosemide which is now transition to furosemide 40 g p.o. twice daily.  Continue to monitor daily  weights, strict I's and O's.  Currently oxygenating well on room air.  Type 2 diabetes mellitus Hemoglobin A1c 6.2, well controlled.  Continue diet control.  Urinary tract infection Completed course of antibiotics.  Hypokalemia Hypomagnesemia Hypocalcemia Repleted during hospitalization, stable.  History of PE: Continue Xarelto  Cognitive impairment/self-neglect: Psychiatry was consulted in the hospitalization, patient does not appear to have capacity to safely manage her complex medical problems.  APS engaged, and DSS reports court hearing on 09/17/2021 with judgment declining the petition for guardianship in which the judge ruled that patient has ability to make her own decisions.  Pending discharge to ALF, medically stable for discharge once bed available.  Super morbid obesity Body mass index is 71.65 kg/m.  Discussed with patient needs for aggressive lifestyle changes/weight loss as this complicates all facets of care.  Outpatient follow-up with PCP.  May benefit from bariatric evaluation outpatient.   DVT prophylaxis: rivaroxaban (XARELTO) tablet 10 mg Start: 10/23/21 1300 SCDs Start: 10/19/21 2107 rivaroxaban (XARELTO) tablet 10 mg    Code Status: Full Code Family Communication: No family present at bedside this morning  Disposition Plan:  Level of care: Med-Surg Status is: Inpatient Remains inpatient appropriate because: Pending guardianship with plan discharge to ALF    Consultants:  Psychiatry PCCM  Procedures:  TTE  Antimicrobials:  Ceftriaxone 8/28 - 8/31   Subjective: Patient seen and examined at bedside, resting comfortably.  Sleeping on CPAP.  No complaints this morning.  Denies headache, no chest pain, no shortness of breath, no abdominal pain.  No acute events overnight per nursing staff.  Medically stable for discharge to ALF once bed available; per TOC.  Objective: Vitals:   11/20/21 0407  11/20/21 1242 11/20/21 2041 11/21/21 0444  BP: 123/61 (!)  154/54 (!) 118/52 (!) 126/53  Pulse: 67 74 91 74  Resp: 20 16 17 18   Temp: 98.9 F (37.2 C) 98.1 F (36.7 C) 98.5 F (36.9 C) 98.3 F (36.8 C)  TempSrc:  Oral Oral Oral  SpO2: 100% 93% 95% 100%  Weight:      Height:        Intake/Output Summary (Last 24 hours) at 11/21/2021 1012 Last data filed at 11/21/2021 0444 Gross per 24 hour  Intake 480 ml  Output 700 ml  Net -220 ml   Filed Weights   11/12/21 0607 11/15/21 0500 11/16/21 0500  Weight: (!) 176 kg (!) 175 kg (!) 172 kg    Examination:  Physical Exam: GEN: NAD, alert and oriented x 3, morbidly obese HEENT: NCAT, PERRL, EOMI, sclera clear, MMM PULM: CTAB w/o wheezes/crackles, normal respiratory effort, on CPAP CV: RRR w/o M/G/R GI: abd soft, NTND, NABS, no R/G/M MSK: no peripheral edema, moves all extremities independently Integumentary: dry/intact, no rashes or wounds    Data Reviewed: I have personally reviewed following labs and imaging studies  CBC: No results for input(s): "WBC", "NEUTROABS", "HGB", "HCT", "MCV", "PLT" in the last 168 hours. Basic Metabolic Panel: Recent Labs  Lab 11/18/21 0515  NA 141  K 4.3  CL 103  CO2 32  GLUCOSE 101*  BUN 16  CREATININE 0.83  CALCIUM 8.8*  MG 2.0   GFR: Estimated Creatinine Clearance: 120.7 mL/min (by C-G formula based on SCr of 0.83 mg/dL). Liver Function Tests: No results for input(s): "AST", "ALT", "ALKPHOS", "BILITOT", "PROT", "ALBUMIN" in the last 168 hours. No results for input(s): "LIPASE", "AMYLASE" in the last 168 hours. No results for input(s): "AMMONIA" in the last 168 hours. Coagulation Profile: No results for input(s): "INR", "PROTIME" in the last 168 hours. Cardiac Enzymes: No results for input(s): "CKTOTAL", "CKMB", "CKMBINDEX", "TROPONINI" in the last 168 hours. BNP (last 3 results) No results for input(s): "PROBNP" in the last 8760 hours. HbA1C: No results for input(s): "HGBA1C" in the last 72 hours. CBG: No results for input(s):  "GLUCAP" in the last 168 hours. Lipid Profile: No results for input(s): "CHOL", "HDL", "LDLCALC", "TRIG", "CHOLHDL", "LDLDIRECT" in the last 72 hours. Thyroid Function Tests: No results for input(s): "TSH", "T4TOTAL", "FREET4", "T3FREE", "THYROIDAB" in the last 72 hours. Anemia Panel: No results for input(s): "VITAMINB12", "FOLATE", "FERRITIN", "TIBC", "IRON", "RETICCTPCT" in the last 72 hours. Sepsis Labs: No results for input(s): "PROCALCITON", "LATICACIDVEN" in the last 168 hours.  No results found for this or any previous visit (from the past 240 hour(s)).       Radiology Studies: No results found.      Scheduled Meds:  (feeding supplement) PROSource Plus  30 mL Oral BID BM   furosemide  40 mg Oral BID   influenza vac split quadrivalent PF  0.5 mL Intramuscular Tomorrow-1000   mouth rinse  15 mL Mouth Rinse 4 times per day   potassium chloride  40 mEq Oral BID   Ensure Max Protein  11 oz Oral BID   rivaroxaban  10 mg Oral Daily   Continuous Infusions:  sodium chloride 10 mL/hr at 11/20/21 1900     LOS: 33 days    Time spent: 41 minutes spent on chart review, discussion with nursing staff, consultants, updating family and interview/physical exam; more than 50% of that time was spent in counseling and/or coordination of care.    11/22/21 Alvira Philips, DO  [facility] Available via Epic secure chat 7am-7pm After these hours, please refer to coverage provider listed on [email or web address] 11/21/2021, 10:12 AM

## 2021-11-21 NOTE — Progress Notes (Signed)
Mobility Specialist - Progress Note   11/21/21 0835  Mobility  HOB Elevated/Bed Position Self regulated  Activity Ambulated with assistance in hallway  Range of Motion/Exercises Active  Level of Assistance Modified independent, requires aide device or extra time  Assistive Device Cane  Distance Ambulated (ft) 500 ft  Activity Response Tolerated well  Transport method Ambulatory  $Mobility charge 1 Mobility   Pt received in recliner and agreeable to mobility. No complaints during ambulation. Pt to recliner after session with all needs met.    [facility]

## 2021-11-21 NOTE — Progress Notes (Signed)
Mobility Specialist - Progress Note   11/21/21 1412  Mobility  HOB Elevated/Bed Position Self regulated  Activity Ambulated with assistance in hallway  Range of Motion/Exercises Left arm;Right arm;Active  Level of Assistance Modified independent, requires aide device or extra time  Assistive Device Wheelchair;Front wheel walker  Distance Ambulated (ft) 250 ft  Activity Response Tolerated well  Transport method Ambulatory  $Mobility charge 1 Mobility   Pt received in bed and agreeable to mobility. Took pt outside & assisted w/ theraband arm/leg exercises. Pt managed to walk to lobby w/ cane & wheeled back in at EOS. Pt walked in hallway afterwards with no complaints.  Pt to bed after session with all needs met.    [facility]

## 2021-11-22 DIAGNOSIS — J9622 Acute and chronic respiratory failure with hypercapnia: Secondary | ICD-10-CM | POA: Diagnosis not present

## 2021-11-22 DIAGNOSIS — J9621 Acute and chronic respiratory failure with hypoxia: Secondary | ICD-10-CM | POA: Diagnosis not present

## 2021-11-22 NOTE — Progress Notes (Signed)
PROGRESS NOTE    Felicia Warren  [id number] DOB: 1970/03/26 DOA: 10/19/2021 PCP: Elsie Stain, MD    Brief Narrative:   Felicia Warren is a 51 y.o. female with past medical history significant for morbid obesity, OHS/OSA, COPD, chronic diastolic congestive heart failure, type 2 diabetes mellitus, who presented to [facility] from a motel on 10/19/2021 with altered mental status and adult failure to thrive.  Upon presentation, patient was noted to be disoriented, acidotic with a PCO2 of 80.  Lab work notable for acute renal failure and she was initially admitted under the PCCM service and managed for COPD/CHF exacerbation.  She was initially placed on BiPAP in which she failed and was subsequently intubated and placed on ventilatory support on 10/20/2021.  Patient was successfully extubated on 10/22/2021 and transferred out of the ICU on 10/24/2021 under the hospitalist service.  Social work following, per APS/DSS judge ruled on 11/18/2021 declining petition for guardianship with ruling that patient has ability to make her own decisions.  Medically stable for [facility] once [facility] available.  TOC will let us know when she is ready for discharge.  Assessment & Plan:   Acute hypoxic respiratory failure with hypercapnia Etiology multifactorial secondary to OHS/OSA/COPD/diastolic congestive heart failure exacerbation.  ABG on presentation showed a PCO2 of 80, will start on BiPAP; but continued to deteriorate and subsequently was intubated and placed on ventilatory support.  She was treated with steroids, bronchodilators, diuretics with improvement of respiratory status and was successfully extubated on 10/22/2021.  Currently on room air. --Continue nocturnal BiPAP.  Acute on chronic diastolic congestive heart failure TTE with LVEF 60-65%, LVH.  During her ICU stay, patient received IV furosemide which is now transition to furosemide 40 g p.o. twice daily.  Continue to monitor daily  weights, strict I's and O's.  Currently oxygenating well on room air.  Type 2 diabetes mellitus Hemoglobin A1c 6.2, well controlled.  Continue diet control.  Urinary tract infection Completed course of antibiotics.  Hypokalemia Hypomagnesemia Hypocalcemia Repleted during hospitalization, stable.  History of PE: Continue Xarelto  Cognitive impairment/self-neglect: Psychiatry was consulted in the hospitalization, patient does not appear to have capacity to safely manage her complex medical problems.  APS engaged, and DSS reports court hearing on 09/17/2021 with judgment declining the petition for guardianship in which the judge ruled that patient has ability to make her own decisions.  Pending discharge to ALF, medically stable for discharge once bed available.  Super morbid obesity Body mass index is 71.65 kg/m.  Discussed with patient needs for aggressive lifestyle changes/weight loss as this complicates all facets of care.  Outpatient follow-up with PCP.  May benefit from bariatric evaluation outpatient.   DVT prophylaxis: rivaroxaban (XARELTO) tablet 10 mg Start: 10/23/21 1300 SCDs Start: 10/19/21 2107 rivaroxaban (XARELTO) tablet 10 mg    Code Status: Full Code Family Communication: No family present at bedside this morning  Disposition Plan:  Level of care: Med-Surg Status is: Inpatient Remains inpatient appropriate because: Pending ALF placement/bed availability, per TOC, medically stable for discharge once identified    Consultants:  Psychiatry PCCM  Procedures:  TTE  Antimicrobials:  Ceftriaxone 8/28 - 8/31   Subjective: Patient seen and examined at bedside, resting comfortably.  Sleeping on BiPAP.  No complaints this morning.  Denies headache, no chest pain, no shortness of breath, no abdominal pain.  No acute events overnight per nursing staff.  Went on several walks with the mobility tech yesterday.  Medically stable for  discharge to ALF once bed available; per  TOC.  Objective: Vitals:   11/21/21 0444 11/21/21 1449 11/21/21 2223 11/22/21 0528  BP: (!) 126/53 (!) 114/50 130/68 (!) 101/48  Pulse: 74 81 86 74  Resp: 18 14 18 20   Temp: 98.3 F (36.8 C) 97.8 F (36.6 C) 98.8 F (37.1 C) 98.5 F (36.9 C)  TempSrc: Oral Oral Oral Oral  SpO2: 100% 95% 97% 100%  Weight:      Height:        Intake/Output Summary (Last 24 hours) at 11/22/2021 1104 Last data filed at 11/22/2021 0541 Gross per 24 hour  Intake 840 ml  Output 400 ml  Net 440 ml   Filed Weights   11/12/21 0607 11/15/21 0500 11/16/21 0500  Weight: (!) 176 kg (!) 175 kg (!) 172 kg    Examination:  Physical Exam: GEN: NAD, alert and oriented x 3, morbidly obese HEENT: NCAT, PERRL, EOMI, sclera clear, MMM PULM: CTAB w/o wheezes/crackles, normal respiratory effort, on BiPAP CV: RRR w/o M/G/R GI: abd soft, NTND, NABS, no R/G/M MSK: no peripheral edema, moves all extremities independently Integumentary: dry/intact, no rashes or wounds    Data Reviewed: I have personally reviewed following labs and imaging studies  CBC: No results for input(s): "WBC", "NEUTROABS", "HGB", "HCT", "MCV", "PLT" in the last 168 hours. Basic Metabolic Panel: Recent Labs  Lab 11/18/21 0515  NA 141  K 4.3  CL 103  CO2 32  GLUCOSE 101*  BUN 16  CREATININE 0.83  CALCIUM 8.8*  MG 2.0   GFR: Estimated Creatinine Clearance: 120.7 mL/min (by C-G formula based on SCr of 0.83 mg/dL). Liver Function Tests: No results for input(s): "AST", "ALT", "ALKPHOS", "BILITOT", "PROT", "ALBUMIN" in the last 168 hours. No results for input(s): "LIPASE", "AMYLASE" in the last 168 hours. No results for input(s): "AMMONIA" in the last 168 hours. Coagulation Profile: No results for input(s): "INR", "PROTIME" in the last 168 hours. Cardiac Enzymes: No results for input(s): "CKTOTAL", "CKMB", "CKMBINDEX", "TROPONINI" in the last 168 hours. BNP (last 3 results) No results for input(s): "PROBNP" in the last 8760  hours. HbA1C: No results for input(s): "HGBA1C" in the last 72 hours. CBG: No results for input(s): "GLUCAP" in the last 168 hours. Lipid Profile: No results for input(s): "CHOL", "HDL", "LDLCALC", "TRIG", "CHOLHDL", "LDLDIRECT" in the last 72 hours. Thyroid Function Tests: No results for input(s): "TSH", "T4TOTAL", "FREET4", "T3FREE", "THYROIDAB" in the last 72 hours. Anemia Panel: No results for input(s): "VITAMINB12", "FOLATE", "FERRITIN", "TIBC", "IRON", "RETICCTPCT" in the last 72 hours. Sepsis Labs: No results for input(s): "PROCALCITON", "LATICACIDVEN" in the last 168 hours.  No results found for this or any previous visit (from the past 240 hour(s)).       Radiology Studies: No results found.      Scheduled Meds:  (feeding supplement) PROSource Plus  30 mL Oral BID BM   furosemide  40 mg Oral BID   influenza vac split quadrivalent PF  0.5 mL Intramuscular Tomorrow-1000   mouth rinse  15 mL Mouth Rinse 4 times per day   potassium chloride  40 mEq Oral BID   Ensure Max Protein  11 oz Oral BID   rivaroxaban  10 mg Oral Daily   Continuous Infusions:  sodium chloride 10 mL/hr at 11/20/21 1900     LOS: 34 days    Time spent: 41 minutes spent on chart review, discussion with nursing staff, consultants, updating family and interview/physical exam; more than 50% of that time  was spent in counseling and/or coordination of care.    Jaicion Laurie J [place] DO [facility] Available via Epic secure chat 7am-7pm After these hours, please refer to coverage provider listed on [email or web address] 11/22/2021, 11:04 AM

## 2021-11-22 NOTE — Plan of Care (Signed)
  Problem: Education: Goal: Knowledge of General Education information will improve Description: Including pain rating scale, medication(s)/side effects and non-pharmacologic comfort measures Outcome: Progressing   Problem: Clinical Measurements: Goal: Ability to maintain clinical measurements within normal limits will improve Outcome: Progressing Goal: Will remain free from infection Outcome: Progressing   Problem: Activity: Goal: Risk for activity intolerance will decrease Outcome: Progressing   Problem: Safety: Goal: Ability to remain free from injury will improve Outcome: Progressing   Problem: Clinical Measurements: Goal: Diagnostic test results will improve Outcome: Adequate for Discharge Goal: Respiratory complications will improve Outcome: Adequate for Discharge

## 2021-11-23 DIAGNOSIS — J9621 Acute and chronic respiratory failure with hypoxia: Secondary | ICD-10-CM | POA: Diagnosis not present

## 2021-11-23 DIAGNOSIS — J9622 Acute and chronic respiratory failure with hypercapnia: Secondary | ICD-10-CM | POA: Diagnosis not present

## 2021-11-23 NOTE — Progress Notes (Signed)
Mobility Specialist - Progress Note   11/23/21 0925  Mobility  Activity Ambulated with assistance in hallway  Activity Response Tolerated well  Distance Ambulated (ft) 500 ft  $Mobility charge 1 Mobility  Level of Assistance Modified independent, requires aide device or extra time  Assistive Device Front wheel walker  [facility] Elevated/Bed Position Self regulated  Range of Motion/Exercises Active  Transport method Ambulatory  Mobility Referral No   Pt received in recliner and agreeable to mobility. No complaints during ambulation. Pt to recliner after session with all needs met & nurse in room.    [facility]

## 2021-11-23 NOTE — Progress Notes (Signed)
Mobility Specialist - Progress Note   11/23/21 1322  Mobility  Activity Ambulated independently in hallway  Activity Response Tolerated well  Distance Ambulated (ft) 250 ft  $Mobility charge 1 Mobility  Level of Assistance Independent  Assistive Device [place];Wheelchair  [facility] Elevated/Bed Position Self regulated  Range of Motion/Exercises Active;Left arm;Right arm;Right leg;Left leg  Transport method Ambulatory  Mobility Referral Yes   Pt received in recliner and agreeable to mobility. Took pt outside & assisted w/ theraband exercises that targeted arms & legs. Pt ambulated w/ cane to go outside & was wheeled back in on the wheelchair. Pt ambulated IND in hallway w/ no AD once back on the floor. No complaints during these events. Pt to recliner after session with all needs met.    [facility]

## 2021-11-23 NOTE — Progress Notes (Signed)
PROGRESS NOTE    Felicia Warren  [id number] DOB: 12/01/1970 DOA: 10/19/2021 PCP: Elsie Stain, MD    Brief Narrative:   Felicia Warren is a 51 y.o. female with past medical history significant for morbid obesity, OHS/OSA, COPD, chronic diastolic congestive heart failure, type 2 diabetes mellitus, who presented to [facility] from a motel on 10/19/2021 with altered mental status and adult failure to thrive.  Upon presentation, patient was noted to be disoriented, acidotic with a PCO2 of 80.  Lab work notable for acute renal failure and she was initially admitted under the PCCM service and managed for COPD/CHF exacerbation.  She was initially placed on BiPAP in which she failed and was subsequently intubated and placed on ventilatory support on 10/20/2021.  Patient was successfully extubated on 10/22/2021 and transferred out of the ICU on 10/24/2021 under the hospitalist service.  Social work following, per APS/DSS judge ruled on 11/18/2021 declining petition for guardianship with ruling that patient has ability to make her own decisions.  Medically stable for discharge to [facility] once bed available.  TOC will let us know when she is ready for discharge.  Assessment & Plan:   Acute hypoxic respiratory failure with hypercapnia Etiology multifactorial secondary to OHS/OSA/COPD/diastolic congestive heart failure exacerbation.  ABG on presentation showed a PCO2 of 80, will start on BiPAP; but continued to deteriorate and subsequently was intubated and placed on ventilatory support.  She was treated with steroids, bronchodilators, diuretics with improvement of respiratory status and was successfully extubated on 10/22/2021.  Currently on room air. --Continue nocturnal BiPAP.  Acute on chronic diastolic congestive heart failure TTE with LVEF 60-65%, LVH.  During her ICU stay, patient received IV furosemide which is now transition to furosemide 40 g p.o. twice daily.  Continue to monitor daily  weights, strict I's and O's.  Currently oxygenating well on room air.  Type 2 diabetes mellitus Hemoglobin A1c 6.2, well controlled.  Continue diet control.  Urinary tract infection Completed course of antibiotics.  Hypokalemia Hypomagnesemia Hypocalcemia Repleted during hospitalization, stable.  History of PE: Continue Xarelto  Cognitive impairment/self-neglect: Psychiatry was consulted in the hospitalization, patient does not appear to have capacity to safely manage her complex medical problems.  APS engaged, and DSS reports court hearing on 09/17/2021 with judgment declining the petition for guardianship in which the judge ruled that patient has ability to make her own decisions.  Pending discharge to ALF, medically stable for discharge once bed available.  Super morbid obesity Body mass index is 71.65 kg/m.  Discussed with patient needs for aggressive lifestyle changes/weight loss as this complicates all facets of care.  Outpatient follow-up with PCP.  May benefit from bariatric evaluation outpatient.   DVT prophylaxis: rivaroxaban (XARELTO) tablet 10 mg Start: 10/23/21 1300 SCDs Start: 10/19/21 2107 rivaroxaban (XARELTO) tablet 10 mg    Code Status: Full Code Family Communication: No family present at bedside this morning  Disposition Plan:  Level of care: Med-Surg Status is: Inpatient Remains inpatient appropriate because: Pending ALF placement/bed availability, per TOC, medically stable for discharge once identified    Consultants:  Psychiatry PCCM  Procedures:  TTE  Antimicrobials:  Ceftriaxone 8/28 - 8/31   Subjective: Patient seen and examined at bedside, resting comfortably.  Sitting in bedside chair.  No complaints this morning.  Denies headache, no chest pain, no shortness of breath, no abdominal pain.  No acute events overnight per nursing staff.  Went on several walks with the mobility tech yesterday.  Medically stable  for discharge to ALF once bed  available; per TOC.  Objective: Vitals:   11/22/21 0528 11/22/21 1248 11/22/21 2101 11/23/21 0556  BP: (!) 101/48 (!) 148/80 (!) 120/46 (!) 102/50  Pulse: 74 80 94 67  Resp: 20 20 20 16   Temp: 98.5 F (36.9 C) 98.2 F (36.8 C) 98.7 F (37.1 C) (!) 97.5 F (36.4 C)  TempSrc: Oral Oral Oral Oral  SpO2: 100% 97% 97% 99%  Weight:      Height:        Intake/Output Summary (Last 24 hours) at 11/23/2021 1148 Last data filed at 11/23/2021 01/23/2022 Gross per 24 hour  Intake --  Output 2200 ml  Net -2200 ml   Filed Weights   11/12/21 0607 11/15/21 0500 11/16/21 0500  Weight: (!) 176 kg (!) 175 kg (!) 172 kg    Examination:  Physical Exam: GEN: NAD, alert and oriented x 3, morbidly obese HEENT: NCAT, PERRL, EOMI, sclera clear, MMM PULM: CTAB w/o wheezes/crackles, normal respiratory effort, on BiPAP CV: RRR w/o M/G/R GI: abd soft, NTND, NABS, no R/G/M MSK: no peripheral edema, moves all extremities independently Integumentary: dry/intact, no rashes or wounds    Data Reviewed: I have personally reviewed following labs and imaging studies  CBC: No results for input(s): "WBC", "NEUTROABS", "HGB", "HCT", "MCV", "PLT" in the last 168 hours. Basic Metabolic Panel: Recent Labs  Lab 11/18/21 0515  NA 141  K 4.3  CL 103  CO2 32  GLUCOSE 101*  BUN 16  CREATININE 0.83  CALCIUM 8.8*  MG 2.0   GFR: Estimated Creatinine Clearance: 120.7 mL/min (by C-G formula based on SCr of 0.83 mg/dL). Liver Function Tests: No results for input(s): "AST", "ALT", "ALKPHOS", "BILITOT", "PROT", "ALBUMIN" in the last 168 hours. No results for input(s): "LIPASE", "AMYLASE" in the last 168 hours. No results for input(s): "AMMONIA" in the last 168 hours. Coagulation Profile: No results for input(s): "INR", "PROTIME" in the last 168 hours. Cardiac Enzymes: No results for input(s): "CKTOTAL", "CKMB", "CKMBINDEX", "TROPONINI" in the last 168 hours. BNP (last 3 results) No results for input(s):  "PROBNP" in the last 8760 hours. HbA1C: No results for input(s): "HGBA1C" in the last 72 hours. CBG: No results for input(s): "GLUCAP" in the last 168 hours. Lipid Profile: No results for input(s): "CHOL", "HDL", "LDLCALC", "TRIG", "CHOLHDL", "LDLDIRECT" in the last 72 hours. Thyroid Function Tests: No results for input(s): "TSH", "T4TOTAL", "FREET4", "T3FREE", "THYROIDAB" in the last 72 hours. Anemia Panel: No results for input(s): "VITAMINB12", "FOLATE", "FERRITIN", "TIBC", "IRON", "RETICCTPCT" in the last 72 hours. Sepsis Labs: No results for input(s): "PROCALCITON", "LATICACIDVEN" in the last 168 hours.  No results found for this or any previous visit (from the past 240 hour(s)).       Radiology Studies: No results found.      Scheduled Meds:  (feeding supplement) PROSource Plus  30 mL Oral BID BM   furosemide  40 mg Oral BID   influenza vac split quadrivalent PF  0.5 mL Intramuscular Tomorrow-1000   mouth rinse  15 mL Mouth Rinse 4 times per day   potassium chloride  40 mEq Oral BID   Ensure Max Protein  11 oz Oral BID   rivaroxaban  10 mg Oral Daily   Continuous Infusions:  sodium chloride 10 mL/hr at 11/20/21 1900     LOS: 35 days    Time spent: 41 minutes spent on chart review, discussion with nursing staff, consultants, updating family and interview/physical exam; more than 50% of  that time was spent in counseling and/or coordination of care.    Issam Carlyon J [place] DO [facility] Available via Epic secure chat 7am-7pm After these hours, please refer to coverage provider listed on [email or web address] 11/23/2021, 11:48 AM

## 2021-11-23 NOTE — Progress Notes (Signed)
Mobility Specialist - Progress Note   11/23/21 1513  Mobility  Activity Ambulated independently in room  Activity Response Tolerated well  Distance Ambulated (ft) 25 ft  $Mobility charge 1 Mobility  Level of Assistance Independent  Assistive Device None  HOB Elevated/Bed Position Self regulated  Range of Motion/Exercises Active;Right leg;Left leg  Transport method Ambulatory  Mobility Referral Yes   Pt received in bed and agreeable to mobility. Assisted pt w/ lower extremity theraband exercises. No complaints during exercise. Pt to bathroom w/ all necessities in reach.  [facility]

## 2021-11-24 DIAGNOSIS — J9621 Acute and chronic respiratory failure with hypoxia: Secondary | ICD-10-CM | POA: Diagnosis not present

## 2021-11-24 DIAGNOSIS — J9622 Acute and chronic respiratory failure with hypercapnia: Secondary | ICD-10-CM | POA: Diagnosis not present

## 2021-11-24 NOTE — Progress Notes (Signed)
Mobility Specialist - Progress Note   11/24/21 0956  Mobility  Activity Ambulated with assistance in hallway  Activity Response Tolerated well  Distance Ambulated (ft) 500 ft  $Mobility charge 1 Mobility  Level of Assistance Modified independent, requires aide device or extra time  Assistive Device Front wheel walker  [facility] Elevated/Bed Position Self regulated  Range of Motion/Exercises Active  Transport method Ambulatory  Mobility Referral Yes   Pt received in bed and agreeable to mobility. No complaints during ambulation.Pt to recliner after session with all needs met.    [facility]

## 2021-11-24 NOTE — Progress Notes (Signed)
PROGRESS NOTE    Felicia Warren  [id number] DOB: 12/01/1970 DOA: 10/19/2021 PCP: Elsie Stain, MD    Brief Narrative:   Felicia Warren is a 51 y.o. female with past medical history significant for morbid obesity, OHS/OSA, COPD, chronic diastolic congestive heart failure, type 2 diabetes mellitus, who presented to [facility] from a motel on 10/19/2021 with altered mental status and adult failure to thrive.  Upon presentation, patient was noted to be disoriented, acidotic with a PCO2 of 80.  Lab work notable for acute renal failure and she was initially admitted under the PCCM service and managed for COPD/CHF exacerbation.  She was initially placed on BiPAP in which she failed and was subsequently intubated and placed on ventilatory support on 10/20/2021.  Patient was successfully extubated on 10/22/2021 and transferred out of the ICU on 10/24/2021 under the hospitalist service.  Social work following, per APS/DSS judge ruled on 11/18/2021 declining petition for guardianship with ruling that patient has ability to make her own decisions.  Medically stable for discharge to [facility] once bed available.  TOC will let us know when she is ready for discharge.  Assessment & Plan:   Acute hypoxic respiratory failure with hypercapnia Etiology multifactorial secondary to OHS/OSA/COPD/diastolic congestive heart failure exacerbation.  ABG on presentation showed a PCO2 of 80, will start on BiPAP; but continued to deteriorate and subsequently was intubated and placed on ventilatory support.  She was treated with steroids, bronchodilators, diuretics with improvement of respiratory status and was successfully extubated on 10/22/2021.  Currently on room air. --Continue nocturnal BiPAP.  Acute on chronic diastolic congestive heart failure TTE with LVEF 60-65%, LVH.  During her ICU stay, patient received IV furosemide which is now transition to furosemide 40 g p.o. twice daily.  Continue to monitor daily  weights, strict I's and O's.  Currently oxygenating well on room air.  Type 2 diabetes mellitus Hemoglobin A1c 6.2, well controlled.  Continue diet control.  Urinary tract infection Completed course of antibiotics.  Hypokalemia Hypomagnesemia Hypocalcemia Repleted during hospitalization, stable.  History of PE: Continue Xarelto  Cognitive impairment/self-neglect: Psychiatry was consulted in the hospitalization, patient does not appear to have capacity to safely manage her complex medical problems.  APS engaged, and DSS reports court hearing on 09/17/2021 with judgment declining the petition for guardianship in which the judge ruled that patient has ability to make her own decisions.  Pending discharge to ALF, medically stable for discharge once bed available.  Super morbid obesity Body mass index is 71.65 kg/m.  Discussed with patient needs for aggressive lifestyle changes/weight loss as this complicates all facets of care.  Outpatient follow-up with PCP.  May benefit from bariatric evaluation outpatient.   DVT prophylaxis: rivaroxaban (XARELTO) tablet 10 mg Start: 10/23/21 1300 SCDs Start: 10/19/21 2107 rivaroxaban (XARELTO) tablet 10 mg    Code Status: Full Code Family Communication: No family present at bedside this morning  Disposition Plan:  Level of care: Med-Surg Status is: Inpatient Remains inpatient appropriate because: Pending ALF placement/bed availability, per TOC, medically stable for discharge once identified    Consultants:  Psychiatry PCCM  Procedures:  TTE  Antimicrobials:  Ceftriaxone 8/28 - 8/31   Subjective: Patient seen and examined at bedside, resting comfortably.  Sitting in bedside chair.  No complaints this morning.  Denies headache, no chest pain, no shortness of breath, no abdominal pain.  No acute events overnight per nursing staff.  Went on several walks with the mobility tech yesterday.  Medically stable  for discharge to ALF once bed  available; per TOC.  Objective: Vitals:   11/23/21 0556 11/23/21 1438 11/23/21 2230 11/24/21 0617  BP: (!) 102/50 (!) 156/71 (!) 120/59 (!) 125/59  Pulse: 67 77 (!) 101 99  Resp: 16 16 20 18   Temp: (!) 97.5 F (36.4 C) 97.8 F (36.6 C) 98.5 F (36.9 C) 97.8 F (36.6 C)  TempSrc: Oral  Oral Oral  SpO2: 99% 100% 99% 96%  Weight:    (!) 172 kg  Height:        Intake/Output Summary (Last 24 hours) at 11/24/2021 1232 Last data filed at 11/24/2021 0620 Gross per 24 hour  Intake 120 ml  Output 300 ml  Net -180 ml   Filed Weights   11/15/21 0500 11/16/21 0500 11/24/21 0617  Weight: (!) 175 kg (!) 172 kg (!) 172 kg    Examination:  Physical Exam: GEN: NAD, alert and oriented x 3, morbidly obese HEENT: NCAT, PERRL, EOMI, sclera clear, MMM PULM: CTAB w/o wheezes/crackles, normal respiratory effort, on BiPAP CV: RRR w/o M/G/R GI: abd soft, NTND, NABS, no R/G/M MSK: no peripheral edema, moves all extremities independently Integumentary: dry/intact, no rashes or wounds    Data Reviewed: I have personally reviewed following labs and imaging studies  CBC: No results for input(s): "WBC", "NEUTROABS", "HGB", "HCT", "MCV", "PLT" in the last 168 hours. Basic Metabolic Panel: Recent Labs  Lab 11/18/21 0515  NA 141  K 4.3  CL 103  CO2 32  GLUCOSE 101*  BUN 16  CREATININE 0.83  CALCIUM 8.8*  MG 2.0   GFR: Estimated Creatinine Clearance: 120.7 mL/min (by C-G formula based on SCr of 0.83 mg/dL). Liver Function Tests: No results for input(s): "AST", "ALT", "ALKPHOS", "BILITOT", "PROT", "ALBUMIN" in the last 168 hours. No results for input(s): "LIPASE", "AMYLASE" in the last 168 hours. No results for input(s): "AMMONIA" in the last 168 hours. Coagulation Profile: No results for input(s): "INR", "PROTIME" in the last 168 hours. Cardiac Enzymes: No results for input(s): "CKTOTAL", "CKMB", "CKMBINDEX", "TROPONINI" in the last 168 hours. BNP (last 3 results) No results for  input(s): "PROBNP" in the last 8760 hours. HbA1C: No results for input(s): "HGBA1C" in the last 72 hours. CBG: No results for input(s): "GLUCAP" in the last 168 hours. Lipid Profile: No results for input(s): "CHOL", "HDL", "LDLCALC", "TRIG", "CHOLHDL", "LDLDIRECT" in the last 72 hours. Thyroid Function Tests: No results for input(s): "TSH", "T4TOTAL", "FREET4", "T3FREE", "THYROIDAB" in the last 72 hours. Anemia Panel: No results for input(s): "VITAMINB12", "FOLATE", "FERRITIN", "TIBC", "IRON", "RETICCTPCT" in the last 72 hours. Sepsis Labs: No results for input(s): "PROCALCITON", "LATICACIDVEN" in the last 168 hours.  No results found for this or any previous visit (from the past 240 hour(s)).       Radiology Studies: No results found.      Scheduled Meds:  (feeding supplement) PROSource Plus  30 mL Oral BID BM   furosemide  40 mg Oral BID   influenza vac split quadrivalent PF  0.5 mL Intramuscular Tomorrow-1000   mouth rinse  15 mL Mouth Rinse 4 times per day   potassium chloride  40 mEq Oral BID   Ensure Max Protein  11 oz Oral BID   rivaroxaban  10 mg Oral Daily   Continuous Infusions:  sodium chloride 10 mL/hr at 11/20/21 1900     LOS: 36 days    Time spent: 41 minutes spent on chart review, discussion with nursing staff, consultants, updating family and interview/physical exam;  more than 50% of that time was spent in counseling and/or coordination of care.    Adilyn Humes J [place] DO [facility] Available via Epic secure chat 7am-7pm After these hours, please refer to coverage provider listed on [email or web address] 11/24/2021, 12:32 PM

## 2021-11-24 NOTE — Progress Notes (Signed)
Mobility Specialist - Progress Note   11/24/21 1344  Mobility  Activity Ambulated with assistance in hallway  Activity Response Tolerated well  Distance Ambulated (ft) 250 ft  $Mobility charge 1 Mobility  Level of Assistance Modified independent, requires aide device or extra time  Assistive Device Front wheel walker;Cane;Wheelchair  [facility] Elevated/Bed Position Self regulated  Range of Motion/Exercises Active;Left arm;Right leg  Transport method Ambulatory  Mobility Referral Yes   Pt received in recliner and agreeable to mobility. Pt was able to ambulate to do outdoor exercises. Assisted pt w/ upper & lower extremity theraband exercises. Pt was wheeled back unto unit, where she then ambulated w/ cane in the hallway. No complaints during ambulation. Pt to recliner after session with all needs met.    [facility]

## 2021-11-24 NOTE — TOC Progression Note (Addendum)
Transition of Care [facility]) - Progression Note    Patient Details  Name: Providencia Hottenstein MRN: [id number] Date of Birth: [birth year]  [facility] CM/SW Contact  Bhavika Schnider, Juliann Pulse, RN Phone Number: 11/24/2021, 10:17 AM  Clinical Narrative:see prior TOC notes-patient able to make own decisions-she is in agreement to go to [facility]-left vm w/owner Gregg-await return call-he already agreed to accept-await confirmation if able to accept for d/c. MD notified.Patient's sister Jenny Reichmann has dropped off the patient's CPAP into the rm. Will arrange PTAR once ready.  -1p-TC again to [facility] rep Gregg-he has questions about patient's finances-provided him w/DSS rep Sonia Side tel#[phone]) for patient's managed medicaid plan. Marya Amsler will make visit to hospital today to discuss finances w/patient.     Expected Discharge Plan: Assisted Living Barriers to Discharge: Continued Medical Work up  Expected Discharge Plan and Services Expected Discharge Plan: Assisted Living   Discharge Planning Services: CM Consult Post Acute Care Choice:  (ALF) Living arrangements for the past 2 months: Homeless                 DME Arranged: Walker rolling DME Agency: [employer] Date DME Agency Contacted: 11/16/21 Time DME Agency Contacted: [phone] Representative spoke with at DME Agency: Jodell Cipro             Social Determinants of Health ([place]) Interventions Housing Interventions: Inpatient TOC  Readmission Risk Interventions     No data to display

## 2021-11-25 LAB — BASIC METABOLIC PANEL
Anion gap: 8 (ref 5–15)
BUN: 24 mg/dL — ABNORMAL HIGH (ref 6–20)
CO2: 26 mmol/L (ref 22–32)
Calcium: 8.7 mg/dL — ABNORMAL LOW (ref 8.9–10.3)
Chloride: 103 mmol/L (ref 98–111)
Creatinine, Ser: 0.84 mg/dL (ref 0.44–1.00)
GFR, Estimated: >60 mL/min (ref >60)
Glucose, Bld: 103 mg/dL — ABNORMAL HIGH (ref 70–99)
Potassium: 4.3 mmol/L (ref 3.5–5.1)
Sodium: 137 mmol/L (ref 135–145)

## 2021-11-25 LAB — CBC
HCT: 42.1 % (ref 36.0–46.0)
Hemoglobin: 11.5 g/dL — ABNORMAL LOW (ref 12.0–15.0)
MCH: 22.5 pg — ABNORMAL LOW (ref 26.0–34.0)
MCHC: 27.3 g/dL — ABNORMAL LOW (ref 30.0–36.0)
MCV: 82.5 fL (ref 80.0–100.0)
Platelets: 175 10*3/uL (ref 150–400)
RBC: 5.1 MIL/uL (ref 3.87–5.11)
RDW: 23.9 % — ABNORMAL HIGH (ref 11.5–15.5)
WBC: 6.8 10*3/uL (ref 4.0–10.5)
nRBC: 0 % (ref 0.0–0.2)

## 2021-11-25 MED ORDER — ORAL CARE MOUTH RINSE: 15.0000 mL | OROMUCOSAL | Status: DC | PRN

## 2021-11-25 NOTE — Progress Notes (Signed)
Mobility Specialist - Progress Note   11/25/21 0944  Mobility  Activity Ambulated with assistance in hallway  Activity Response Tolerated well  Distance Ambulated (ft) 500 ft  $Mobility charge 1 Mobility  Level of Assistance Modified independent, requires aide device or extra time  Assistive Device Front wheel walker  [facility] Elevated/Bed Position Self regulated  Range of Motion/Exercises Active  Transport method Ambulatory  Mobility Referral Yes   Pt received in bathroom and agreeable to mobility. No complaints during mobility. Pt to recliner after session with all needs met.    [facility]

## 2021-11-25 NOTE — Evaluation (Signed)
Occupational Therapy Evaluation Patient Details Name: Felicia Warren MRN: [id number] DOB: 03/01/1970 Today's Date: 11/25/2021   History of Present Illness Felicia Warren is a 51 y.o. female with past medical history significant for morbid obesity, OHS/OSA, COPD, chronic diastolic congestive heart failure, type 2 diabetes mellitus, who presented to [facility] from a motel on 10/19/2021 with altered mental status and adult failure to thrive.  Upon presentation, patient was noted to be disoriented, acidotic with a PCO2 of 80.  Lab work notable for acute renal failure and she was initially admitted under the PCCM service and managed for COPD/CHF exacerbation.  She was initially placed on BiPAP in which she failed and was subsequently intubated and placed on ventilatory support on 10/20/2021.  Patient was successfully extubated on 10/22/2021 and transferred out of the ICU on 10/24/2021 under the hospitalist service.  Social work following, per APS/DSS judge ruled on 11/18/2021 declining petition for guardianship with ruling that patient has ability to make her own decisions.     Medically stable for discharge to [facility] once bed available.  TOC will let us know when she is ready for discharge. (Copied per updated medical record)   Clinical Impression   Patient was found in bed, she was oriented x4, she was pleasant,  and she was motivated to participate in the session. She performed all assessed tasks with modified independence or better, including supine to sit, simulated dressing, sit to stand using a rolling walker, and ambulating in the hall using a rolling walker. She appears to be near her baseline level of functioning for ADL management & she is not presenting with functional deficits that warrant the need for further OT services. OT will sign off.      Recommendations for follow up therapy are one component of a multi-disciplinary discharge planning process, led by the attending physician.   Recommendations may be updated based on patient status, additional functional criteria and insurance authorization.   Follow Up Recommendations  No OT follow up    Assistance Recommended at Discharge PRN        Equipment Recommendations  None recommended by OT       Precautions / Restrictions        Mobility Bed Mobility Overal bed mobility: Modified Independent Bed Mobility: Supine to Sit         Transfers   Equipment used: Rolling walker (2 wheels) Transfers: Sit to/from Stand Sit to Stand: Modified independent (Device/Increase time)            Balance     Sitting balance-Leahy Scale: Good       Standing balance-Leahy Scale: Good Standing balance comment: with rolling walker          ADL either performed or assessed with clinical judgement   ADL   Eating/Feeding: Independent   Grooming: Modified independent Grooming Details (indicate cue type and reason): standing at sink level         Upper Body Dressing : Independent   Lower Body Dressing: Independent   Toilet Transfer: Modified Independent               Vision Patient Visual Report: No change from baseline Additional Comments: able to read the time depicted on the wall clock without difficulty            Pertinent Vitals/Pain Pain Assessment Pain Assessment: No/denies pain     Hand Dominance Right   Extremity/Trunk Assessment Upper Extremity Assessment Upper Extremity Assessment: Overall WFL for tasks  assessed   Lower Extremity Assessment Lower Extremity Assessment: Overall WFL for tasks assessed       Communication Communication Communication: No difficulties   Cognition Arousal/Alertness: Awake/alert Behavior During Therapy: WFL for tasks assessed/performed Overall Cognitive Status: Within Functional Limits for tasks assessed          General Comments: Oriented x4, able to follow commands without difficulty, friendly                Home Living  Family/patient expects to be discharged to:: Unsure     Type of Home: Other(Comment) (She was staying at a motel, however currently has an uncertain discharge disposition.)           Prior Functioning/Environment Prior Level of Function : Independent/Modified Independent             Mobility Comments: Intermittent use of cane ADLs Comments: Independent with ADls         AM-PAC OT "6 Clicks" Daily Activity     Outcome Measure Help from another person eating meals?: None Help from another person taking care of personal grooming?: None Help from another person toileting, which includes using toliet, bedpan, or urinal?: None Help from another person bathing (including washing, rinsing, drying)?: None Help from another person to put on and taking off regular upper body clothing?: None Help from another person to put on and taking off regular lower body clothing?: None 6 Click Score: 24   End of Session Equipment Utilized During Treatment: Rolling walker (2 wheels) Nurse Communication: Mobility status  Activity Tolerance: Patient tolerated treatment well Patient left: in chair;with call bell/phone within reach  OT Visit Diagnosis: Muscle weakness (generalized) (M62.81)                Time: 6720-9470 OT Time Calculation (min): 22 min Charges:  OT General Charges $OT Visit: 1 Visit OT Evaluation $OT Eval Low Complexity: 1 Low    Tashi Andujo L Areliz Rothman, OTR/L 11/25/2021, 5:29 PM

## 2021-11-25 NOTE — Progress Notes (Signed)
PROGRESS NOTE    Felicia Warren  BLT:[id number] DOB: 07-07-1970 DOA: 10/19/2021 PCP: Elsie Stain, MD   Brief Narrative:  Felicia Warren is a 51 y.o. female with past medical history significant for morbid obesity, OHS/OSA, COPD, chronic diastolic congestive heart failure, type 2 diabetes mellitus, who presented to [facility] from a motel on 10/19/2021 with altered mental status and adult failure to thrive.  Upon presentation, patient was noted to be disoriented, acidotic with a PCO2 of 80.  Lab work notable for acute renal failure and she was initially admitted under the PCCM service and managed for COPD/CHF exacerbation.  She was initially placed on BiPAP in which she failed and was subsequently intubated and placed on ventilatory support on 10/20/2021.  Patient was successfully extubated on 10/22/2021 and transferred out of the ICU on 10/24/2021 under the hospitalist service.  Social work following, per APS/DSS judge ruled on 11/18/2021 declining petition for guardianship with ruling that patient has ability to make her own decisions.   Medically stable for discharge to [facility] once bed available.  TOC will let us know when she is ready for discharge.  Assessment and Plan:  Acute Hypoxic Respiratory failure with hypercapnia -Etiology multifactorial secondary to OHS/OSA/COPD/diastolic congestive heart failure exacerbation.   -ABG on presentation showed a PCO2 of 80 and was started on BiPAP; but continued to deteriorate and subsequently was intubated and placed on ventilatory support.  She was treated with steroids, bronchodilators, diuretics with improvement of respiratory status and was successfully extubated on 10/22/2021. -Currently on room air. -SpO2: 97 % O2 Flow Rate (L/min): 2 L/min FiO2 (%): 30 % -Continue nocturnal BiPAP.   Acute on chronic diastolic congestive heart failure -TTE with LVEF 60-65%, LVH.  During her ICU stay, patient received IV furosemide which is now transition to  furosemide 40 g p.o. twice daily.   -Continue to monitor daily weights, strict I's and O's.   -She is -4.312 Liters since Admission and Weight is down  -Currently oxygenating well on room air.   Type 2 Diabetes Mellitus -Hemoglobin A1c 6.2, well controlled.   -Continue diet control. -Stopped Jardiance -CBGs ranging from 91-103   Urinary Tract Infection without Hematuria -Completed course of antibiotics.   Hypokalemia Hypomagnesemia Hypocalcemia -Repleted during hospitalization, stable. -K+ is now 4.3, Mag Level was 2.0 on last check and Ca2+ is 8.7 -Continue to Monitor and Trend Intermittently    History of PE -Continue Rivaroxaban   Cognitive impairment/self-neglect: -Psychiatry was consulted in the hospitalization, patient does not appear to have capacity to safely manage her complex medical problems.   -APS engaged, and DSS reports court hearing on 09/17/2021 with judgment declining the petition for guardianship in which the judge ruled that patient has ability to make her own decisions. -Pending discharge to ALF, medically stable for discharge once bed available.   Super Morbid Obesity OHS and Obstructive Sleep Apnea -Complicates overall prognosis and care -Estimated body mass index is 64.98 kg/m as calculated from the following:   Height as of this encounter: 5\' 1"  (1.549 m).   Weight as of this encounter: 156 kg.  -Weight Loss and Dietary Counseling given -Outpatient follow-up with PCP.  May benefit from bariatric evaluation outpatient.   DVT prophylaxis: rivaroxaban (XARELTO) tablet 10 mg Start: 10/23/21 1300 SCDs Start: 10/19/21 2107 rivaroxaban (XARELTO) tablet 10 mg    Code Status: Full Code Family Communication: No family present at bedside  Disposition Plan:  Level of care: Med-Surg Status is: Inpatient Remains inpatient appropriate  because: Pending ALF placement/bed availability, per TOC, medically stable for discharge once identified   Consultants:   Psychiatry PCCM  Procedures:  TTE  Antimicrobials:  Anti-infectives (From admission, onward)    Start     Dose/Rate Route Frequency Ordered Stop   10/22/21 2100  cefTRIAXone (ROCEPHIN) 1 g in sodium chloride 0.9 % 100 mL IVPB        1 g 200 mL/hr over 30 Minutes Intravenous Every 24 hours 10/22/21 0748 10/22/21 2203   10/20/21 2100  cefTRIAXone (ROCEPHIN) 1 g in sodium chloride 0.9 % 100 mL IVPB  Status:  Discontinued        1 g 200 mL/hr over 30 Minutes Intravenous Every 24 hours 10/19/21 2118 10/22/21 0748   10/19/21 2100  cefTRIAXone (ROCEPHIN) 2 g in sodium chloride 0.9 % 100 mL IVPB        2 g 200 mL/hr over 30 Minutes Intravenous  Once 10/19/21 2046 10/19/21 2238       Subjective: Seen and examined at bedside and she is sitting up in the bed resting comfortably.  No complaints.  No nausea or vomiting.  Remains medically stable to discharge to ALF once bed is available per TOC.  Objective: Vitals:   11/24/21 1956 11/25/21 0500 11/25/21 0533 11/25/21 1241  BP: (!) 114/56  (!) 110/54 122/66  Pulse: 94  74 87  Resp: 20  20 17   Temp: 99 F (37.2 C)  98.2 F (36.8 C) 98 F (36.7 C)  TempSrc: Oral  Oral Oral  SpO2: 97%  100% 97%  Weight:  (!) 156 kg    Height:        Intake/Output Summary (Last 24 hours) at 11/25/2021 1335 Last data filed at 11/25/2021 1000 Gross per 24 hour  Intake 1040 ml  Output --  Net 1040 ml   Filed Weights   11/16/21 0500 11/24/21 0617 11/25/21 0500  Weight: (!) 172 kg (!) 172 kg (!) 156 kg   Examination: Physical Exam:  Constitutional: Super morbidly obese African-American female in no acute distress Respiratory: Diminished to auscultation bilaterally, no wheezing, rales, rhonchi or crackles. Normal respiratory effort and patient is not tachypenic. No accessory muscle use.  Unlabored breathing Cardiovascular: RRR, no murmurs / rubs / gallops. S1 and S2 auscultated.  Trace extremity edema Abdomen: Soft, non-tender, distended secondary  body habitus.  Bowel sounds positive.  GU: Deferred. Musculoskeletal: No clubbing / cyanosis of digits/nails. No joint deformity upper and lower extremities.  Skin: No rashes, lesions, ulcers. No induration; Warm and dry.  Neurologic: CN 2-12 grossly intact with no focal deficits. Romberg sign and cerebellar reflexes not assessed.  Psychiatric: Normal judgment and insight. Alert and oriented x 3. Normal mood and appropriate affect.   Data Reviewed: I have personally reviewed following labs and imaging studies  CBC: Recent Labs  Lab 11/25/21 0529  WBC 6.8  HGB 11.5*  HCT 42.1  MCV 82.5  PLT 175   Basic Metabolic Panel: Recent Labs  Lab 11/25/21 0529  NA 137  K 4.3  CL 103  CO2 26  GLUCOSE 103*  BUN 24*  CREATININE 0.84  CALCIUM 8.7*   GFR: Estimated Creatinine Clearance: 111.4 mL/min (by C-G formula based on SCr of 0.84 mg/dL). Liver Function Tests: No results for input(s): "AST", "ALT", "ALKPHOS", "BILITOT", "PROT", "ALBUMIN" in the last 168 hours. No results for input(s): "LIPASE", "AMYLASE" in the last 168 hours. No results for input(s): "AMMONIA" in the last 168 hours. Coagulation Profile: No results for  input(s): "INR", "PROTIME" in the last 168 hours. Cardiac Enzymes: No results for input(s): "CKTOTAL", "CKMB", "CKMBINDEX", "TROPONINI" in the last 168 hours. BNP (last 3 results) No results for input(s): "PROBNP" in the last 8760 hours. HbA1C: No results for input(s): "HGBA1C" in the last 72 hours. CBG: No results for input(s): "GLUCAP" in the last 168 hours. Lipid Profile: No results for input(s): "CHOL", "HDL", "LDLCALC", "TRIG", "CHOLHDL", "LDLDIRECT" in the last 72 hours. Thyroid Function Tests: No results for input(s): "TSH", "T4TOTAL", "FREET4", "T3FREE", "THYROIDAB" in the last 72 hours. Anemia Panel: No results for input(s): "VITAMINB12", "FOLATE", "FERRITIN", "TIBC", "IRON", "RETICCTPCT" in the last 72 hours. Sepsis Labs: No results for input(s):  "PROCALCITON", "LATICACIDVEN" in the last 168 hours.  No results found for this or any previous visit (from the past 240 hour(s)).   Radiology Studies: No results found.  Scheduled Meds:  (feeding supplement) PROSource Plus  30 mL Oral BID BM   furosemide  40 mg Oral BID   influenza vac split quadrivalent PF  0.5 mL Intramuscular Tomorrow-1000   mouth rinse  15 mL Mouth Rinse 4 times per day   potassium chloride  40 mEq Oral BID   Ensure Max Protein  11 oz Oral BID   rivaroxaban  10 mg Oral Daily   Continuous Infusions:  sodium chloride 10 mL/hr at 11/20/21 1900    LOS: 37 days   Marguerita Merles, DO [facility] Available via Epic secure chat 7am-7pm After these hours, please refer to coverage provider listed on [email or web address] 11/25/2021, 1:35 PM

## 2021-11-25 NOTE — Discharge Instructions (Signed)
[employer] [facility]) provides free housing counseling assistance in locating affordable rental housing or housing with support services for families and individuals in crisis and the... Main Services help find housing  housing advice  navigating the system Serving adults young adults teens seniors individuals families homeless Next Steps: Call [phone]  contact or go to the nearest location   3.17 miles ( serves your local area) [address], [place], [place] [address]  Open Now : 8:30 AM - 4:30 PM

## 2021-11-25 NOTE — Plan of Care (Signed)
  Problem: Health Behavior/Discharge Planning: Goal: Ability to manage health-related needs will improve Outcome: Progressing   Problem: Education: Goal: Knowledge of General Education information will improve Description: Including pain rating scale, medication(s)/side effects and non-pharmacologic comfort measures Outcome: Adequate for Discharge   Problem: Clinical Measurements: Goal: Ability to maintain clinical measurements within normal limits will improve Outcome: Adequate for Discharge Goal: Will remain free from infection Outcome: Adequate for Discharge Goal: Diagnostic test results will improve Outcome: Adequate for Discharge Goal: Respiratory complications will improve Outcome: Adequate for Discharge Goal: Cardiovascular complication will be avoided Outcome: Adequate for Discharge   Problem: Activity: Goal: Risk for activity intolerance will decrease Outcome: Adequate for Discharge   Problem: Elimination: Goal: Will not experience complications related to bowel motility Outcome: Adequate for Discharge Goal: Will not experience complications related to urinary retention Outcome: Adequate for Discharge   Problem: Safety: Goal: Ability to remain free from injury will improve Outcome: Adequate for Discharge   Problem: Skin Integrity: Goal: Risk for impaired skin integrity will decrease Outcome: Adequate for Discharge

## 2021-11-25 NOTE — Progress Notes (Signed)
Mobility Specialist - Progress Note   11/25/21 1317  Mobility  Activity Ambulated with assistance in hallway  Activity Response Tolerated well  Distance Ambulated (ft) 250 ft  $Mobility charge 1 Mobility  Level of Assistance Modified independent, requires aide device or extra time  [employer] wheel walker;Wheelchair  [facility] Elevated/Bed Position Self regulated  Range of Motion/Exercises Active  Transport method Ambulatory  Mobility Referral Yes   Pt received in bed and agreeable to mobility. Assisted pt with outdoor upper/lower extremity exercises. Pt to bathroom after session with all needs met.    [facility]

## 2021-11-25 NOTE — TOC Progression Note (Addendum)
Transition of Care [facility]) - Progression Note    Patient Details  Name: Felicia Warren MRN: [id number] Date of Birth: 29-Feb-1970  Transition of Care [facility]) CM/SW Contact  Kennon Encinas, Juliann Pulse, RN Phone Number: 11/25/2021, 10:00 AM  Clinical Narrative: Awaiting outcome from [place]owner Carleene Overlie if able to accept post his visit to discuss patient's finances w/her directly(he is aware that hospital cannot confirm her finances) supv also aware.  -3p-left several messages/emails w/[facility] transitions coordinator to discuss if able to provide housing,Family care home, or any assistance with d/c to next level. Spoke to [employer] of disability rep Kirby Crigler per patient permission while in patient rm-she requested to fax H&P,MD progress note to f/u on disability after patient is d/c tel#[phone],fax#[phone] patient agree. Informed of [facility](day shelter)@ d/c. Provided patient w/tel# also. Confirmed per supv-[facility] owner Carleene Overlie has declined to offer ALF d/t unable to secure her financial status for payment to facility.Continue to monitor.    Expected Discharge Plan: Assisted Living Barriers to Discharge: Continued Medical Work up  Expected Discharge Plan and Services Expected Discharge Plan: Assisted Living   Discharge Planning Services: CM Consult Post Acute Care Choice:  (ALF) Living arrangements for the past 2 months: Homeless                 DME Arranged: Walker rolling DME Agency: [employer] Date DME Agency Contacted: 11/16/21 Time DME Agency Contacted: [phone] Representative spoke with at DME Agency: Jodell Cipro             Social Determinants of Health ([place]) Interventions Housing Interventions: Inpatient TOC  Readmission Risk Interventions     No data to display

## 2021-11-26 NOTE — TOC Progression Note (Addendum)
Transition of Care [facility]) - Progression Note    Patient Details  Name: Felicia Warren MRN: [id number] Date of Birth: [birth year]  Transition of Care [facility]) CM/SW Contact  Dyshon Philbin, Juliann Pulse, RN Phone Number: 11/26/2021, 10:08 AM  Clinical Narrative:  [facility] working on safe d/c plan-await outcome.May asst w/safe transport once ready.    Expected Discharge Plan:  (TBD)   Expected Discharge Plan and Services Expected Discharge Plan:  (TBD)   Discharge Planning Services: CM Consult Post Acute Care Choice:  (ALF) Living arrangements for the past 2 months: Homeless                 DME Arranged: Walker rolling DME Agency: [employer] Date DME Agency Contacted: 11/16/21 Time DME Agency Contacted: [phone] Representative spoke with at DME Agency: Jodell Cipro             Social Determinants of Health ([place]) Interventions Housing Interventions: Inpatient TOC  Readmission Risk Interventions     No data to display

## 2021-11-26 NOTE — Progress Notes (Signed)
Mobility Specialist - Progress Note   11/26/21 1409  Mobility  Activity Ambulated with assistance in hallway  Activity Response Tolerated well  Distance Ambulated (ft) 250 ft  $Mobility charge 1 Mobility  Level of Assistance Modified independent, requires aide device or extra time  Assistive Device Cane  [facility] Elevated/Bed Position Self regulated  Range of Motion/Exercises Active  Transport method Ambulatory  Mobility Referral Yes   Pt received in bathroom and agreeable to mobility. Pt walked w/ cane to be able to go outside & do outdoor exercises. Exercises included theraband exercises for her upper/lower extremity. Pt wheeled back in & ambulated in hallway w/ cane. Pt to reclined at EOS with all necessities in reach.    [facility]

## 2021-11-26 NOTE — Progress Notes (Signed)
Physical Therapy Re-Evaluation Patient Details Name: Felicia Warren MRN: [id number] DOB: January 14, 1971 Today's Date: 11/26/2021  History of Present Illness  Felicia Warren is a 51 y.o. female with past medical history significant for morbid obesity, OHS/OSA, COPD, chronic diastolic congestive heart failure, type 2 diabetes mellitus, who presented to [facility] from a motel on 10/19/2021 with altered mental status and adult failure to thrive.  Upon presentation, patient was noted to be disoriented, acidotic with a PCO2 of 80.  Lab work notable for acute renal failure and she was initially admitted under the PCCM service and managed for COPD/CHF exacerbation.  She was initially placed on BiPAP in which she failed and was subsequently intubated and placed on ventilatory support on 10/20/2021.  Patient was successfully extubated on 10/22/2021 and transferred out of the ICU on 10/24/2021 under the hospitalist service.  Social work following, per APS/DSS judge ruled on 11/18/2021 declining petition for guardianship with ruling that patient has ability to make her own decisions. Medically stable for discharge [facility] once bed available.  TOC will let us know when she is ready for discharge.   Clinical Impression  Patient remains in hospital since discharge from acute PT services on 11/04/21 and has been mobilizing with hospital staff. Re-consulted for re-eval given extended stay and difficulty with safe discharge placement. Pt overall mobilizing well and ambulated ~600' with RW and no LOB, slow and steady pace, VSS on RA throughout gait. EOS completed 5x sit<>stand; pt unable to complete without UE use and took ~28 seconds to perform test indicating significant functional LE weakness. She will continue to benefit from skilled acute PT to progress strength and function. Recommend pt continue mobilizing with RN/NT/mobility staff. TOC is managing placement for discharge. Will follow and progress as able.      Recommendations for follow up therapy are one component of a multi-disciplinary discharge planning process, led by the attending physician.  Recommendations may be updated based on patient status, additional functional criteria and insurance authorization. PT Recommendation   Follow Up Recommendations No PT follow up Filed 11/04/2021 1550  Can patient physically be transported by private vehicle Yes Filed 11/26/2021 1108  Assistance recommended at discharge Intermittent Supervision/Assistance Filed 11/26/2021 1108  Patient can return home with the following Assistance with cooking/housework, Direct supervision/assist for medications management, Direct supervision/assist for financial management, Assist for transportation Filed 11/26/2021 1108  Functional Status Assessment Patient has had a recent decline in their functional status and demonstrates the ability to make significant improvements in function in a reasonable and predictable amount of time. Filed 11/26/2021 1108  PT equipment Rolling walker (2 wheels) Filed 11/26/2021 1108   Precautions / Restrictions        Mobility  Bed Mobility               General bed mobility comments: OOB in recliner    Transfers Overall transfer level: Needs assistance Equipment used: Rolling walker (2 wheels) Transfers: Sit to/from Stand Sit to Stand: Supervision           General transfer comment: sup for safety, no assist needed to power up or control lowering    Ambulation/Gait Ambulation/Gait assistance: Supervision Gait Distance (Feet): 600 Feet (2 standing rest breaks) Assistive device: Rolling walker (2 wheels) Gait Pattern/deviations: Step-through pattern, Decreased stride length, Wide base of support Gait velocity: decr     General Gait Details: pt steady, no LOB noted throughout gait with RW. wide BOS and slow pace. pt on RA and VSS  throughout.  Stairs            Wheelchair Mobility    Modified Rankin (Stroke  Patients Only)       Balance Overall balance assessment: Needs assistance Sitting-balance support: No upper extremity supported, Feet supported Sitting balance-Leahy Scale: Good     Standing balance support: Bilateral upper extremity supported, Reliant on assistive device for balance Standing balance-Leahy Scale: Poor Standing balance comment: reliant on RW                             Pertinent Vitals/Pain Pain Assessment Pain Assessment: No/denies pain    Home Living                          Prior Function                       Hand Dominance        Extremity/Trunk Assessment                Communication      Cognition Arousal/Alertness: Awake/alert Behavior During Therapy: WFL for tasks assessed/performed Overall Cognitive Status: Within Functional Limits for tasks assessed                                 General Comments: Oriented x4, able to follow commands without difficulty, friendly and motivated           11/26/21 1108  PT - End of Session  Activity Tolerance Patient tolerated treatment well  Patient left in chair;with call bell/phone within reach  Nurse Communication Mobility status  PT Assessment  PT Recommendation/Assessment Patient needs continued PT services  PT Visit Diagnosis Difficulty in walking, not elsewhere classified (R26.2)  PT Problem List Decreased strength;Decreased mobility;Decreased safety awareness;Decreased activity tolerance;Decreased knowledge of use of DME;Obesity;Decreased cognition  PT Plan  PT Frequency (ACUTE ONLY) Min 2X/week  PT Treatment/Interventions (ACUTE ONLY) DME instruction;Therapeutic activities;Cognitive remediation;Gait training;Therapeutic exercise;Patient/family education;Functional mobility training  AM-PAC PT "6 Clicks" Mobility Outcome Measure (Version 2)  Help needed turning from your back to your side while in a flat bed without using bedrails? 4  Help  needed moving from lying on your back to sitting on the side of a flat bed without using bedrails? 4  Help needed moving to and from a bed to a chair (including a wheelchair)? 4  Help needed standing up from a chair using your arms (e.g., wheelchair or bedside chair)? 4  Help needed to walk in hospital room? 3  Help needed climbing 3-5 steps with a railing?  2  6 Click Score 21  Consider Recommendation of Discharge To: Home with no services  Progressive Mobility  What is the highest level of mobility based on the progressive mobility assessment? Level 5 (Walks with assist in room/hall) - Balance while stepping forward/back and can walk in room with assist - Complete  Mobility Referral Yes  Activity Ambulated with assistance in hallway  PT Recommendation  Can patient physically be transported by private vehicle Yes  Assistance recommended at discharge Intermittent Supervision/Assistance  Patient can return home with the following Assistance with cooking/housework;Direct supervision/assist for medications management;Direct supervision/assist for financial management;Assist for transportation  Functional Status Assessment Patient has had a recent decline in their functional status and demonstrates the ability to make significant improvements in function in a  reasonable and predictable amount of time.  PT equipment Rolling walker (2 wheels)  Individuals Consulted  Consulted and Agree with Results and Recommendations Patient  Acute Rehab PT Goals  Patient Stated Goal get a job and go somewhere safe  PT Goal Formulation With patient  Time For Goal Achievement 12/10/21  Potential to Achieve Goals Fair  PT Time Calculation  PT Start Time (ACUTE ONLY) 1051  PT Stop Time (ACUTE ONLY) 1119  PT Time Calculation (min) (ACUTE ONLY) 28 min  PT General Charges  $$ ACUTE PT VISIT 1 Visit  PT Evaluation  $PT Re-evaluation 1 Re-eval  PT Treatments  $Gait Training 8-22 mins     Wynn Maudlin,  DPT Acute Rehabilitation Services Office [phone]  11/26/21 3:57 PM

## 2021-11-26 NOTE — Progress Notes (Signed)
Mobility Specialist - Progress Note   11/26/21 0933  Mobility  Activity Ambulated with assistance in hallway  Activity Response Tolerated well  Distance Ambulated (ft) 250 ft  $Mobility charge 1 Mobility  Level of Assistance Modified independent, requires aide device or extra time  Assistive Device Front wheel walker  [facility] Elevated/Bed Position Self regulated  Range of Motion/Exercises Active  Transport method Ambulatory  Mobility Referral Yes   Pt received in recliner and agreeable to mobility. No complaints during mobility. Pt to recliner after session with all needs met & RN in room.  [facility]

## 2021-11-26 NOTE — Progress Notes (Signed)
PROGRESS NOTE    Felicia ColaKwisha Warren  ZOX:[id number] DOB: 02/12/1971 DOA: 10/19/2021 PCP: Storm FriskWright, Patrick E, MD   Brief Narrative:  Felicia Warren is a 51 y.o. female with past medical history significant for morbid obesity, OHS/OSA, COPD, chronic diastolic congestive heart failure, type 2 diabetes mellitus, who [facility] from a motel on 10/19/2021 with altered mental status and adult failure to thrive.  Upon presentation, patient was noted to be disoriented, acidotic with a PCO2 of 80.  Lab work notable for acute renal failure and she was initially admitted under the PCCM service and managed for COPD/CHF exacerbation.  She was initially placed on BiPAP in which she failed and was subsequently intubated and placed on ventilatory support on 10/20/2021.  Patient was successfully extubated on 10/22/2021 and transferred out of the ICU on 10/24/2021 under the hospitalist service.  Social work following, per APS/DSS judge ruled on 11/18/2021 declining petition for guardianship with ruling that patient has ability to make her own decisions.   Medically stable for [facility] once bed available however she was unfortunately denied and not accepted.  TOC will let us know when she is ready for discharge and currently they are working on a safe discharge plan for this patient as I do not feel that [facility] is the best option for her.  Assessment and Plan: Acute Hypoxic Respiratory failure with hypercapnia, improved -Etiology multifactorial secondary to OHS/OSA/COPD/diastolic congestive heart failure exacerbation.   -ABG on presentation showed a PCO2 of 80 and was started on BiPAP; but continued to deteriorate and subsequently was intubated and placed on ventilatory support.  She was treated with steroids, bronchodilators, diuretics with improvement of respiratory status and was successfully extubated on 10/22/2021. -Currently on room air. -SpO2: 94 % O2 Flow Rate  (L/min): 2 L/min FiO2 (%): 30 % -Continue nocturnal BiPAP.   Acute on chronic diastolic congestive heart failure -TTE with LVEF 60-65%, LVH.  During her ICU stay, patient received IV furosemide which is now transition to furosemide 40 g p.o. twice daily.   -Continue to monitor daily weights, strict I's and O's.   -She is -4.482 Liters since Admission and Weight is down  -Currently oxygenating well on room air.   Type 2 Diabetes Mellitus -Hemoglobin A1c 6.2, well controlled.   -Continue diet control. -Stopped Jardiance -CBGs ranging from 91-103   Urinary Tract Infection without Hematuria -Completed course of antibiotics.   Hypokalemia Hypomagnesemia Hypocalcemia -Repleted during hospitalization, stable. -K+ is now 4.3, Mag Level was 2.0 on last check and Ca2+ is 8.7 -Continue to Monitor and Trend Intermittently    History of PE -Continue Rivaroxaban   Cognitive impairment/self-neglect: -Psychiatry was consulted in the hospitalization, patient does not appear to have capacity to safely manage her complex medical problems.   -APS engaged, and DSS reports court hearing on 09/17/2021 with judgment declining the petition for guardianship in which the judge ruled that patient has ability to make her own decisions. -Pending discharge to ALF, medically stable for discharge once bed available however she was not excepted and so TOC is working on arranging a safe discharge disposition for this patient.   Super Morbid Obesity OHS and Obstructive Sleep Apnea -Complicates overall prognosis and care -Estimated body mass index is 64.98 kg/m as calculated from the following:   Height as of this encounter: 5\' 1"  (1.549 m).   Weight as of this encounter: 156 kg.  -Weight Loss and Dietary Counseling given -Outpatient  follow-up with PCP.  May benefit from bariatric evaluation outpatient  DVT prophylaxis: rivaroxaban (XARELTO) tablet 10 mg Start: 10/23/21 1300 SCDs Start: 10/19/21  2107 rivaroxaban (XARELTO) tablet 10 mg    Code Status: Full Code Family Communication: No family currently at bedside  Disposition Plan:  Level of care: Med-Surg Status is: Inpatient Remains inpatient appropriate because: Was to go to ALF however there is no safe discharge disposition for the patient currently and TOC arranging safe discharge disposition   Consultants:  Psychiatry PCCM Transfer  Procedures:  TTE IMPRESSIONS     1. Left ventricular ejection fraction, by estimation, is 60 to 65%. The  left ventricle has normal function. The left ventricle has no regional  wall motion abnormalities. There is moderate concentric left ventricular  hypertrophy. Left ventricular  diastolic parameters are consistent with Grade II diastolic dysfunction  (pseudonormalization).   2. Right ventricular systolic function is low normal. The right  ventricular size is mildly enlarged. There is normal pulmonary artery  systolic pressure.   3. The mitral valve is normal in structure. No evidence of mitral valve  regurgitation. No evidence of mitral stenosis. The mean mitral valve  gradient is 3.0 mmHg with average heart rate of 74 bpm.   4. The aortic valve was not well visualized. Aortic valve regurgitation  is not visualized. No aortic stenosis is present.   Comparison(s): No significant change from prior study.   FINDINGS   Left Ventricle: Left ventricular ejection fraction, by estimation, is 60  to 65%. The left ventricle has normal function. The left ventricle has no  regional wall motion abnormalities. The left ventricular internal cavity  size was normal in size. There is   moderate concentric left ventricular hypertrophy. Left ventricular  diastolic parameters are consistent with Grade II diastolic dysfunction  (pseudonormalization).   Right Ventricle: The right ventricular size is mildly enlarged. No  increase in right ventricular wall thickness. Right ventricular systolic   function is low normal. There is normal pulmonary artery systolic  pressure. The tricuspid regurgitant velocity is  2.63 m/s, and with an assumed right atrial pressure of 8 mmHg, the  estimated right ventricular systolic pressure is 35.7 mmHg.   Left Atrium: Left atrial size was normal in size.   Right Atrium: Right atrial size was normal in size.   Pericardium: Trivial pericardial effusion is present.   Mitral Valve: The mitral valve is normal in structure. No evidence of  mitral valve regurgitation. No evidence of mitral valve stenosis. The mean  mitral valve gradient is 3.0 mmHg with average heart rate of 74 bpm.   Tricuspid Valve: The tricuspid valve is normal in structure. Tricuspid  valve regurgitation is not demonstrated. No evidence of tricuspid  stenosis.   Aortic Valve: The aortic valve was not well visualized. Aortic valve  regurgitation is not visualized. No aortic stenosis is present. Aortic  valve peak gradient measures 17.4 mmHg.   Pulmonic Valve: The pulmonic valve was not well visualized. Pulmonic valve  regurgitation is not visualized. No evidence of pulmonic stenosis.   Aorta: The aortic root and ascending aorta are structurally normal, with  no evidence of dilitation.   IAS/Shunts: No atrial level shunt detected by color flow Doppler.      LEFT VENTRICLE  PLAX 2D  LVIDd:         4.50 cm   Diastology  LVIDs:         2.90 cm   LV e' medial:    6.00 cm/s  LV PW:         1.30 cm   LV E/e' medial:  21.5  LV IVS:        1.30 cm   LV e' lateral:   11.10 cm/s  LVOT diam:     2.00 cm   LV E/e' lateral: 11.6  LV SV:         95  LV SV Index:   36  LVOT Area:     3.14 cm      RIGHT VENTRICLE         IVC  TAPSE (M-mode): 1.6 cm  IVC diam: 1.90 cm   LEFT ATRIUM             Index        RIGHT ATRIUM           Index  LA diam:        4.00 cm 1.50 cm/m   RA Area:     20.85 cm  LA Vol (A2C):   73.8 ml 27.68 ml/m  RA Volume:   73.10 ml  27.42 ml/m  LA Vol  (A4C):   52.7 ml 19.77 ml/m  LA Biplane Vol: 64.7 ml 24.27 ml/m   AORTIC VALVE                  PULMONIC VALVE  AV Area (Vmax): 2.70 cm      PV Vmax:       0.84 m/s  AV Vmax:        208.50 cm/s   PV Peak grad:  2.8 mmHg  AV Peak Grad:   17.4 mmHg  LVOT Vmax:      179.00 cm/s  LVOT Vmean:     100.000 cm/s  LVOT VTI:       0.302 m     AORTA  Ao Root diam: 3.20 cm  Ao Asc diam:  3.90 cm   MITRAL VALVE                TRICUSPID VALVE  MV Area (PHT): 2.83 cm     TR Peak grad:   27.7 mmHg  MV Mean grad:  3.0 mmHg     TR Vmax:        263.00 cm/s  MV Decel Time: 268 msec  MV E velocity: 129.00 cm/s  SHUNTS  MV A velocity: 104.00 cm/s  Systemic VTI:  0.30 m  MV E/A ratio:  1.24         Systemic Diam: 2.00 cm   Antimicrobials:  Anti-infectives (From admission, onward)    Start     Dose/Rate Route Frequency Ordered Stop   10/22/21 2100  cefTRIAXone (ROCEPHIN) 1 g in sodium chloride 0.9 % 100 mL IVPB        1 g 200 mL/hr over 30 Minutes Intravenous Every 24 hours 10/22/21 0748 10/22/21 2203   10/20/21 2100  cefTRIAXone (ROCEPHIN) 1 g in sodium chloride 0.9 % 100 mL IVPB  Status:  Discontinued        1 g 200 mL/hr over 30 Minutes Intravenous Every 24 hours 10/19/21 2118 10/22/21 0748   10/19/21 2100  cefTRIAXone (ROCEPHIN) 2 g in sodium chloride 0.9 % 100 mL IVPB        2 g 200 mL/hr over 30 Minutes Intravenous  Once 10/19/21 2046 10/19/21 2238       Subjective: Seen and examined at bedside and she is sitting in the chair and she denies any chest pain or  shortness breath.  She is frustrated about the situation of being denied at ALF.  Hoping to see can help her with her disability paperwork.  No nausea or vomiting.  No other concerns or plaints at this time.  Objective: Vitals:   11/25/21 1700 11/25/21 1931 11/26/21 0408 11/26/21 1217  BP:  (!) 109/52 (!) 109/54 118/68  Pulse:  89 71 80  Resp:  20 20 18   Temp:  98.2 F (36.8 C) 97.8 F (36.6 C) 98 F (36.7 C)  TempSrc:  Oral  Oral Oral  SpO2: 98% 99% 100% 94%  Weight:      Height:        Intake/Output Summary (Last 24 hours) at 11/26/2021 1645 Last data filed at 11/26/2021 1400 Gross per 24 hour  Intake 480 ml  Output 900 ml  Net -420 ml   Filed Weights   11/16/21 0500 11/24/21 0617 11/25/21 0500  Weight: (!) 172 kg (!) 172 kg (!) 156 kg   Examination: Physical Exam:  Constitutional: Super morbid obese African-American female in no acute distress Respiratory: Diminished to auscultation bilaterally, no wheezing, rales, rhonchi or crackles. Normal respiratory effort and patient is not tachypenic. No accessory muscle use.  Unlabored breathing Cardiovascular: RRR, no murmurs / rubs / gallops. S1 and S2 auscultated.  Has trace extremity edema Abdomen: Soft, non-tender, distended secondary body habitus. Bowel sounds positive.  GU: Deferred. Musculoskeletal: No clubbing / cyanosis of digits/nails. No joint deformity upper and lower extremities.  Skin: No rashes, lesions, ulcers on limited skin evaluation. No induration; Warm and dry.  Neurologic: CN 2-12 grossly intact with no focal deficits. Romberg sign and cerebellar reflexes not assessed.  Psychiatric: Normal judgment and insight. Alert and oriented x 3. Normal mood and appropriate affect.   Data Reviewed: I have personally reviewed following labs and imaging studies  CBC: Recent Labs  Lab 11/25/21 0529  WBC 6.8  HGB 11.5*  HCT 42.1  MCV 82.5  PLT 175   Basic Metabolic Panel: Recent Labs  Lab 11/25/21 0529  NA 137  K 4.3  CL 103  CO2 26  GLUCOSE 103*  BUN 24*  CREATININE 0.84  CALCIUM 8.7*   GFR: Estimated Creatinine Clearance: 111.4 mL/min (by C-G formula based on SCr of 0.84 mg/dL). Liver Function Tests: No results for input(s): "AST", "ALT", "ALKPHOS", "BILITOT", "PROT", "ALBUMIN" in the last 168 hours. No results for input(s): "LIPASE", "AMYLASE" in the last 168 hours. No results for input(s): "AMMONIA" in the last 168  hours. Coagulation Profile: No results for input(s): "INR", "PROTIME" in the last 168 hours. Cardiac Enzymes: No results for input(s): "CKTOTAL", "CKMB", "CKMBINDEX", "TROPONINI" in the last 168 hours. BNP (last 3 results) No results for input(s): "PROBNP" in the last 8760 hours. HbA1C: No results for input(s): "HGBA1C" in the last 72 hours. CBG: No results for input(s): "GLUCAP" in the last 168 hours. Lipid Profile: No results for input(s): "CHOL", "HDL", "LDLCALC", "TRIG", "CHOLHDL", "LDLDIRECT" in the last 72 hours. Thyroid Function Tests: No results for input(s): "TSH", "T4TOTAL", "FREET4", "T3FREE", "THYROIDAB" in the last 72 hours. Anemia Panel: No results for input(s): "VITAMINB12", "FOLATE", "FERRITIN", "TIBC", "IRON", "RETICCTPCT" in the last 72 hours. Sepsis Labs: No results for input(s): "PROCALCITON", "LATICACIDVEN" in the last 168 hours.  No results found for this or any previous visit (from the past 240 hour(s)).   Radiology Studies: No results found.   Scheduled Meds:  (feeding supplement) PROSource Plus  30 mL Oral BID BM   furosemide  40  mg Oral BID   influenza vac split quadrivalent PF  0.5 mL Intramuscular Tomorrow-1000   mouth rinse  15 mL Mouth Rinse 4 times per day   potassium chloride  40 mEq Oral BID   Ensure Max Protein  11 oz Oral BID   rivaroxaban  10 mg Oral Daily   Continuous Infusions:  sodium chloride 10 mL/hr at 11/20/21 1900    LOS: 38 days   Marguerita Merles, DO [facility] Available via Epic secure chat 7am-7pm After these hours, please refer to coverage provider listed on [email or web address] 11/26/2021, 4:45 PM

## 2021-11-27 ENCOUNTER — Other Ambulatory Visit (HOSPITAL_COMMUNITY): Payer: Self-pay

## 2021-11-27 LAB — COMPREHENSIVE METABOLIC PANEL
ALT: 11 U/L (ref 0–44)
AST: 18 U/L (ref 15–41)
Albumin: 3.3 g/dL — ABNORMAL LOW (ref 3.5–5.0)
Alkaline Phosphatase: 105 U/L (ref 38–126)
Anion gap: 8 (ref 5–15)
BUN: 27 mg/dL — ABNORMAL HIGH (ref 6–20)
CO2: 28 mmol/L (ref 22–32)
Calcium: 8.7 mg/dL — ABNORMAL LOW (ref 8.9–10.3)
Chloride: 102 mmol/L (ref 98–111)
Creatinine, Ser: 0.83 mg/dL (ref 0.44–1.00)
GFR, Estimated: >60 mL/min (ref >60)
Glucose, Bld: 101 mg/dL — ABNORMAL HIGH (ref 70–99)
Potassium: 4.2 mmol/L (ref 3.5–5.1)
Sodium: 138 mmol/L (ref 135–145)
Total Bilirubin: 0.8 mg/dL (ref 0.3–1.2)
Total Protein: 6.6 g/dL (ref 6.5–8.1)

## 2021-11-27 LAB — CBC WITH DIFFERENTIAL/PLATELET
Abs Immature Granulocytes: 0.04 10*3/uL (ref 0.00–0.07)
Basophils Absolute: 0 10*3/uL (ref 0.0–0.1)
Basophils Relative: 0 %
Eosinophils Absolute: 0.5 10*3/uL (ref 0.0–0.5)
Eosinophils Relative: 7 %
HCT: 39.1 % (ref 36.0–46.0)
Hemoglobin: 10.9 g/dL — ABNORMAL LOW (ref 12.0–15.0)
Immature Granulocytes: 1 %
Lymphocytes Relative: 19 %
Lymphs Abs: 1.5 10*3/uL (ref 0.7–4.0)
MCH: 22.8 pg — ABNORMAL LOW (ref 26.0–34.0)
MCHC: 27.9 g/dL — ABNORMAL LOW (ref 30.0–36.0)
MCV: 81.8 fL (ref 80.0–100.0)
Monocytes Absolute: 0.5 10*3/uL (ref 0.1–1.0)
Monocytes Relative: 6 %
Neutro Abs: 5.2 10*3/uL (ref 1.7–7.7)
Neutrophils Relative %: 67 %
Platelets: 187 10*3/uL (ref 150–400)
RBC: 4.78 MIL/uL (ref 3.87–5.11)
RDW: 23.9 % — ABNORMAL HIGH (ref 11.5–15.5)
WBC: 7.8 10*3/uL (ref 4.0–10.5)
nRBC: 0 % (ref 0.0–0.2)

## 2021-11-27 LAB — MAGNESIUM: Magnesium: 1.9 mg/dL (ref 1.7–2.4)

## 2021-11-27 LAB — PHOSPHORUS: Phosphorus: 5.7 mg/dL — ABNORMAL HIGH (ref 2.5–4.6)

## 2021-11-27 MED ORDER — ENSURE MAX PROTEIN PO LIQD
11.0000 [oz_av] | Freq: Two times a day (BID) | ORAL | 0 refills | Status: AC
  Filled 2021-11-27: qty 3330, 5d supply, fill #0

## 2021-11-27 MED ORDER — FUROSEMIDE 40 MG PO TABS
40.0000 mg | ORAL_TABLET | Freq: Two times a day (BID) | ORAL | 0 refills | Status: DC
  Filled 2021-11-27: qty 60, 30d supply, fill #0

## 2021-11-27 MED ORDER — POLYETHYLENE GLYCOL 3350 17 G PO PACK
17.0000 g | PACK | Freq: Every day | ORAL | 0 refills | Status: AC | PRN
  Filled 2021-11-27: qty 14, 14d supply, fill #0

## 2021-11-27 MED ORDER — PROSOURCE PLUS PO LIQD
30.0000 mL | Freq: Two times a day (BID) | ORAL | 0 refills | Status: AC
  Filled 2021-11-27: qty 887, 15d supply, fill #0

## 2021-11-27 MED ORDER — DIPHENHYDRAMINE-ZINC ACETATE 2-0.1 % EX CREA
TOPICAL_CREAM | Freq: Two times a day (BID) | CUTANEOUS | 0 refills | Status: AC | PRN
  Filled 2021-11-27: qty 28.4, fill #0

## 2021-11-27 MED ORDER — POTASSIUM CHLORIDE CRYS ER 20 MEQ PO TBCR
20.0000 meq | EXTENDED_RELEASE_TABLET | Freq: Two times a day (BID) | ORAL | 1 refills | Status: DC
  Filled 2021-11-27: qty 60, 30d supply, fill #0
  Filled 2021-12-14: qty 60, 30d supply, fill #1
  Filled ????-??-??: fill #1

## 2021-11-27 MED ORDER — DOCUSATE SODIUM 100 MG PO CAPS
100.0000 mg | ORAL_CAPSULE | Freq: Two times a day (BID) | ORAL | 0 refills | Status: AC | PRN
  Filled 2021-11-27: qty 100, 50d supply, fill #0

## 2021-11-27 MED ORDER — ALBUTEROL SULFATE (2.5 MG/3ML) 0.083% IN NEBU
2.5000 mg | INHALATION_SOLUTION | RESPIRATORY_TRACT | 12 refills | Status: DC | PRN
  Filled 2021-11-27: qty 90, 5d supply, fill #0

## 2021-11-27 NOTE — TOC Transition Note (Signed)
Transition of Care [facility]) - CM/SW Discharge Note   Patient Details  Name: Felicia Warren MRN: [id number] Date of Birth: 04/24/1970  Transition of Care [facility] CM/SW Contact:  Dessa Phi, RN Phone Number: 11/27/2021, 2:08 PM   Clinical Narrative: See prior TOC notes-[facility] [address] [id number] to deliver neb machine to rm prior d/c. Already has rw/cpap from prior.Nsg to call safe transport. Patient has already been told that pasrr is not appropriate to do for her d/c.No further CM needs.     Final next level of care: Other (comment) [facility]) Barriers to Discharge: No Barriers Identified   Patient Goals and CMS Choice Patient states their goals for this hospitalization and ongoing recovery are::  (ALF) CMS Medicare.gov Compare Post Acute Care list provided to:: Patient Represenative (must comment) Choice offered to / list presented to : Patient  Discharge Placement                       Discharge Plan and Services   Discharge Planning Services: CM Consult Post Acute Care Choice:  (ALF)          DME Arranged: Nebulizer machine DME Agency: [employer] Date DME Agency Contacted: 11/27/21 Time DME Agency Contacted: [phone] Representative spoke with at DME Agency: Andee Poles            Social Determinants of Health ([place]) Interventions Housing Interventions: Inpatient TOC   Readmission Risk Interventions     No data to display

## 2021-11-27 NOTE — TOC Progression Note (Addendum)
Transition of Care [facility]) - Progression Note    Patient Details  Name: Felicia Warren MRN: [id number] Date of Birth: 1970/11/03  Transition of Care [facility]) CM/SW Contact  Sadey Yandell, Juliann Pulse, RN Phone Number: 11/27/2021, 9:45 AM  Clinical Narrative: see prior TOC note-Supv managing a safe d/c plan-await outcome. PCP appt set(see d/c f/u).patient has rw, & cpap in rm. Nsg to arrange safe transport @ d/c.  -1:27p  [name] was already informed yesterday by supv of d/c to hotel. Hotel is all confirmed for :[facility] [address] [address]. Once d/c order placed-nsg arrange safe transport.    Expected Discharge Plan:  (TBD) Barriers to Discharge: Continued Medical Work up  Expected Discharge Plan and Services Expected Discharge Plan:  (TBD)   Discharge Planning Services: CM Consult Post Acute Care Choice:  (ALF) Living arrangements for the past 2 months: Homeless                 DME Arranged: Walker rolling DME Agency: [employer] Date DME Agency Contacted: 11/16/21 Time DME Agency Contacted: [phone] Representative spoke with at DME Agency: Jodell Cipro             Social Determinants of Health ([place]) Interventions Housing Interventions: Inpatient TOC  Readmission Risk Interventions     No data to display

## 2021-11-27 NOTE — Discharge Summary (Signed)
Physician Discharge Summary   Patient: Felicia Warren MRN: [id number] DOB: June 10, 1970  Admit date:     10/19/2021  Discharge date: 11/27/21  Discharge Physician: Raiford Noble, DO   PCP: Elsie Stain, MD   Recommendations at discharge:   Follow-up with PCP within 1 to 2 weeks and repeat CBC, CMP, mag, Phos within 1 week Follow-up with pulmonary in outpatient setting within 1 to 2 weeks and continue  BiPAP Repeat chest x-ray in 3 to 6 weeks  Discharge Diagnoses: Principal Problem:   Acute on chronic respiratory failure with hypoxia and hypercapnia (HCC) Active Problems:   Obesity hypoventilation syndrome and obstructive sleep apnea   Acute on chronic diastolic CHF (congestive heart failure) (HCC)   Type 2 diabetes mellitus without complication (HCC)   Urinary tract infection without hematuria   Class 3 obesity ([place])   History of pulmonary embolism   Self neglect and cognitive impairment   Hypokalemia and hypomagnesemia   Hypocalcemia  Resolved Problems:   * No resolved hospital problems. *  Hospital Course: Felicia Warren is a 51 y.o. female with past medical history significant for morbid obesity, OHS/OSA, COPD, chronic diastolic congestive heart failure, type 2 diabetes mellitus, who presented to [facility] from a motel on 10/19/2021 with altered mental status and adult failure to thrive.  Upon presentation, patient was noted to be disoriented, acidotic with a PCO2 of 80.  Lab work notable for acute renal failure and she was initially admitted under the PCCM service and managed for COPD/CHF exacerbation.  She was initially placed on BiPAP in which she failed and was subsequently intubated and placed on ventilatory support on 10/20/2021.  Patient was successfully extubated on 10/22/2021 and transferred out of the ICU on 10/24/2021 under the hospitalist service.  Social work following, per APS/DSS judge ruled on 11/18/2021 declining petition for guardianship with ruling that patient has ability to  make her own decisions.   Medically stable for [facility] once bed available however she was unfortunately denied and not accepted.  TOC will let us know when she is ready for discharge and currently they are working on a safe discharge plan for this patient and originally they proposed sending her to the [facility] but this is not the best option for her but they were able to complete a hotel looking for for 2 weeks today and patient's application was submitted to the right fund and referred to the congressional nursing program.  She is medically stable to be discharged at this time and will need to follow-up with PCP and pulmonology in outpatient setting.  Assessment and Plan: Acute Hypoxic Respiratory failure with hypercapnia, improved -Etiology multifactorial secondary to OHS/OSA/COPD/diastolic congestive heart failure exacerbation.   -ABG on presentation showed a PCO2 of 80 and was started on BiPAP; but continued to deteriorate and subsequently was intubated and placed on ventilatory support.  She was treated with steroids, bronchodilators, diuretics with improvement of respiratory status and was successfully extubated on 10/22/2021. -Currently on room air and not using any supplemental oxygen; will need outpatient continued BiPAP as well as DME nebulizer just in case as well as follow-up with pulm in outpatient setting -SpO2: 100 % O2 Flow Rate (L/min): 2 L/min FiO2 (%): 30 % -Continue nocturnal BiPAP.   Acute on chronic diastolic congestive heart failure -TTE with LVEF 60-65%, LVH.  During her ICU stay, patient received IV furosemide which is now transition to furosemide 40 g p.o. twice daily which will be continued at  discharge.   -Continue to monitor daily weights, strict I's and O's.   -She is - 5.062 liters since Admission and Weight is down  -Currently oxygenating well on room air.   Type 2 Diabetes Mellitus -Hemoglobin A1c 6.2, well controlled.   -Continue diet  control. -Stopped Jardiance -CBGs ranging from 91-103   Urinary Tract Infection without Hematuria -Completed course of antibiotics.   Hypokalemia Hypomagnesemia Hypocalcemia -Repleted during hospitalization, stable. -K+ is now 4.2, Mag Level was 1.9 on last check and Ca2+ is 8.7 -Continue to Monitor and Trend Intermittently in outpatient setting   History of PE -Continue Rivaroxaban for VTE prophylaxis while hospitalized and not actual treatment dose so we will discontinue for discharge given that she is amatory   Cognitive impairment/self-neglect: -Psychiatry was consulted in the hospitalization, patient does not appear to have capacity to safely manage her complex medical problems.   -APS engaged, and DSS reports court hearing on 09/17/2021 with judgment declining the petition for guardianship in which the judge ruled that patient has ability to make her own decisions. -Pending discharge to ALF, medically stable for discharge once bed available however she was not excepted and so TOC is working on arranging a safe discharge disposition for this patient and they found her a an extended stay hotel.   Super Morbid Obesity OHS and Obstructive Sleep Apnea -Complicates overall prognosis and care -Estimated body mass index is 64.98 kg/m as calculated from the following:   Height as of this encounter: 5\' 1"  (1.549 m).   Weight as of this encounter: 156 kg.  -Weight Loss and Dietary Counseling given -Outpatient follow-up with PCP.  May benefit from bariatric evaluation outpatient   Nutrition Documentation    [facility] ED to Hosp-Admission (Current) from 10/19/2021 in [place]  Nutrition Problem Inadequate oral intake  Etiology inability to eat  Nutrition Goal Patient will meet greater than or equal to 90% of their needs  Interventions Premier Protein, MVI, Prostat      Consultants: Psychiatry, PCCM transfer Procedures performed:  TTE Disposition:  Extended stay hotel Diet recommendation:  Discharge Diet Orders (From admission, onward)     Start     Ordered   11/27/21 0000  Diet - low sodium heart healthy        11/27/21 1342   11/27/21 0000  Diet Carb Modified        11/27/21 1342           Cardiac and Carb modified diet DISCHARGE MEDICATION: Allergies as of 11/27/2021   No Known Allergies      Medication List     STOP taking these medications    torsemide 20 MG tablet Commonly known as: DEMADEX       TAKE these medications    (feeding supplement) PROSource Plus liquid Take 30 mLs by mouth 2 (two) times daily between meals.   Ensure Max Protein Liqd Take 330 mLs (11 oz total) by mouth 2 (two) times daily.   albuterol (2.5 MG/3ML) 0.083% nebulizer solution Commonly known as: PROVENTIL Take 3 mLs (2.5 mg total) by nebulization every 4 (four) hours as needed for wheezing or shortness of breath.   diphenhydrAMINE-zinc acetate cream Commonly known as: BENADRYL Apply topically 2 (two) times daily as needed for itching.   docusate sodium 100 MG capsule Commonly known as: COLACE Take 1 capsule (100 mg total) by mouth 2 (two) times daily as needed for mild constipation.   furosemide 40 MG tablet  Commonly known as: LASIX Take 1 tablet (40 mg total) by mouth 2 (two) times daily.   multivitamin with minerals Tabs tablet Take 1 tablet by mouth daily.   pantoprazole 40 MG tablet Commonly known as: PROTONIX Take 1 tablet (40 mg total) by mouth at bedtime.   polyethylene glycol 17 g packet Commonly known as: MIRALAX / GLYCOLAX Take 17 g by mouth daily as needed for moderate constipation.   potassium chloride SA 20 MEQ tablet Commonly known as: KLOR-CON M Take 1 tablet (20 mEq total) by mouth 2 (two) times daily.               Durable Medical Equipment  (From admission, onward)           Start     Ordered   11/27/21 0000  For home use only DME Nebulizer machine        Question Answer Comment  Patient needs a nebulizer to treat with the following condition SOB (shortness of breath)   Length of Need 6 Months      11/27/21 1342   11/22/21 1107  For home use only DME Bipap  Once       Question Answer Comment  Length of Need Lifetime   Keep 02 saturation >92%   Inspiratory pressure 20   Expiratory pressure 8      11/22/21 1106   11/04/21 1606  For home use only DME Walker rolling  Once       Comments: May need wide or bariatric size  Question Answer Comment  Walker: With 5 Inch Wheels   Patient needs a walker to treat with the following condition Difficulty walking      11/04/21 [place]. Go on 12/04/2021.   Specialty: Internal Medicine Why: @ 8:40a Contact information: [place] [id number] [place] [phone]               Discharge Exam: Filed Weights   11/16/21 0500 11/24/21 0617 11/25/21 0500  Weight: (!) 172 kg (!) 172 kg (!) 156 kg   Vitals:   11/27/21 0418 11/27/21 1329  BP: (!) 99/54 115/72  Pulse: 78 80  Resp: 18 (!) 22  Temp: 97.9 F (36.6 C) 98.7 F (37.1 C)  SpO2: 100% 100%   Examination: Physical Exam:  Constitutional: WN/WD morbidly obese African-American female currently in no acute distress appears calm sitting in the bedside chair Respiratory: Slight diminished to auscultation bilaterally, no wheezing, rales, rhonchi or crackles. Normal respiratory effort and patient is not tachypenic. No accessory muscle use.  Unlabored breathing Cardiovascular: RRR, no murmurs / rubs / gallops. S1 and S2 auscultated. No extremity edema.  Abdomen: Soft, non-tender, non-distended.  Bowel sounds positive.  GU: Deferred. Musculoskeletal: No clubbing / cyanosis of digits/nails. No joint deformity upper and lower extremities. Skin: No rashes, lesions, ulcers on limited skin evaluation. No induration; Warm and dry.   Neurologic: CN 2-12 grossly intact with no focal deficits. Romberg sign and cerebellar reflexes not assessed.  Psychiatric: Normal judgment and insight. Alert and oriented x 3. Normal mood and appropriate affect.   Condition at discharge: stable  The results of significant diagnostics from this hospitalization (including imaging, microbiology, ancillary and laboratory) are listed below for reference.   Imaging Studies: No results found.  Microbiology: Results for orders placed or performed during the hospital encounter of  10/19/21  Respiratory (~20 pathogens) panel by PCR     Status: None   Collection Time: 10/19/21  9:21 PM   Specimen: Nasopharyngeal Swab; Respiratory  Result Value Ref Range Status   Adenovirus NOT DETECTED NOT DETECTED Final   Coronavirus 229E NOT DETECTED NOT DETECTED Final    Comment: (NOTE) The Coronavirus on the Respiratory Panel, DOES NOT test for the novel  Coronavirus (2019 nCoV)    Coronavirus HKU1 NOT DETECTED NOT DETECTED Final   Coronavirus NL63 NOT DETECTED NOT DETECTED Final   Coronavirus OC43 NOT DETECTED NOT DETECTED Final   Metapneumovirus NOT DETECTED NOT DETECTED Final   Rhinovirus / Enterovirus NOT DETECTED NOT DETECTED Final   Influenza A NOT DETECTED NOT DETECTED Final   Influenza B NOT DETECTED NOT DETECTED Final   Parainfluenza Virus 1 NOT DETECTED NOT DETECTED Final   Parainfluenza Virus 2 NOT DETECTED NOT DETECTED Final   Parainfluenza Virus 3 NOT DETECTED NOT DETECTED Final   Parainfluenza Virus 4 NOT DETECTED NOT DETECTED Final   Respiratory Syncytial Virus NOT DETECTED NOT DETECTED Final   Bordetella pertussis NOT DETECTED NOT DETECTED Final   Bordetella Parapertussis NOT DETECTED NOT DETECTED Final   Chlamydophila pneumoniae NOT DETECTED NOT DETECTED Final   Mycoplasma pneumoniae NOT DETECTED NOT DETECTED Final    Comment: Performed at [facility], [place]. [address], [place], [place] [address]  Urine Culture     Status:  Abnormal   Collection Time: 10/19/21  9:39 PM   Specimen: Urine, Catheterized  Result Value Ref Range Status   Specimen Description   Final    URINE, CATHETERIZED Performed at [place] [address], [place], [place] [address]    Special Requests   Final    NONE Performed at [facility], [place] [address], [place], [place] [address]    Culture (A)  Final    70,000 COLONIES/mL LACTOBACILLUS SPECIES Standardized susceptibility testing for this organism is not available. Performed at [facility], [place] [address], [place],  [address]    Report Status 10/21/2021 FINAL  Final  SARS Coronavirus 2 by RT PCR (hospital order, performed in [facility] hospital lab) *cepheid single result test*     Status: None   Collection Time: 10/19/21 11:12 PM  Result Value Ref Range Status   SARS Coronavirus 2 by RT PCR NEGATIVE NEGATIVE Final    Comment: (NOTE) SARS-CoV-2 target nucleic acids are NOT DETECTED.  The SARS-CoV-2 RNA is generally detectable in upper and lower respiratory specimens during the acute phase of infection. The lowest concentration of SARS-CoV-2 viral copies this assay can detect is 250 copies / mL. A negative result does not preclude SARS-CoV-2 infection and should not be used as the sole basis for treatment or other patient management decisions.  A negative result may occur with improper specimen collection / handling, submission of specimen other than nasopharyngeal swab, presence of viral mutation(s) within the areas targeted by this assay, and inadequate number of viral copies (<250 copies / mL). A negative result must be combined with clinical observations, patient history, and epidemiological information.  Fact Sheet for Patients:   https://www.patel.info/  Fact Sheet for Healthcare Providers: https://hall.com/  This test is not yet approved or  cleared by the [place] FDA and has been authorized for detection and/or diagnosis of SARS-CoV-2 by FDA under an Emergency Use Authorization (EUA).  This EUA will remain in effect (meaning this test can be used) for  the duration of the COVID-19 declaration under Section 564(b)(1) of the Act, 21 U.S.C. section 360bbb-3(b)(1), unless the authorization is terminated or revoked sooner.  Performed at [facility], [place] [address], [place], [place] [address]   MRSA Next Gen by PCR, Nasal     Status: None   Collection Time: 10/20/21  1:33 AM   Specimen: Nasal Mucosa; Nasal Swab  Result Value Ref Range Status   MRSA by PCR Next Gen NOT DETECTED NOT DETECTED Final    Comment: (NOTE) The GeneXpert MRSA Assay (FDA approved for NASAL specimens only), is one component of a comprehensive MRSA colonization surveillance program. It is not intended to diagnose MRSA infection nor to guide or monitor treatment for MRSA infections. Test performance is not FDA approved in patients less than 55 years old. Performed at [facility], [place] [address], [place], [place] [address]     Labs: CBC: Recent Labs  Lab 11/25/21 0529 11/27/21 0506  WBC 6.8 7.8  NEUTROABS  --  5.2  HGB 11.5* 10.9*  HCT 42.1 39.1  MCV 82.5 81.8  PLT 175 [id number]   Basic Metabolic Panel: Recent Labs  Lab 11/25/21 0529 11/27/21 0506  NA 137 138  K 4.3 4.2  CL 103 102  CO2 26 28  GLUCOSE 103* 101*  BUN 24* 27*  CREATININE 0.84 0.83  CALCIUM 8.7* 8.7*  MG  --  1.9  PHOS  --  5.7*   Liver Function Tests: Recent Labs  Lab 11/27/21 0506  AST 18  ALT 11  ALKPHOS 105  BILITOT 0.8  PROT 6.6  ALBUMIN 3.3*   CBG: No results for input(s): "GLUCAP" in the last 168 hours.  Discharge time spent: greater than 30 minutes.  Signed: Raiford Noble, DO [facility] 11/27/2021

## 2021-11-27 NOTE — Congregational Nurse Program (Signed)
Dept: [phone]   Congregational Nurse Program Note  Date of Encounter: 11/27/2021  Past Medical History: Past Medical History:  Diagnosis Date   Acute respiratory failure with hypoxia and hypercarbia (HCC)    AKI (acute kidney injury) ([place])    Community acquired pneumonia    Demand ischemia ([place])    Diarrhea    Hypercapnic respiratory failure ([place]) 06/25/2020   Hypoglycemia    Hypokalemia    Morbid obesity (HCC)    Normocytic anemia    OSA treated with BiPAP    Pulmonary embolism ([place])    Transaminitis     Encounter Details:  CNP Questionnaire - 11/27/21 1733       Questionnaire   Ask client: Do you give verbal consent for me to treat you today? Yes    Student Assistance N/A    Location Patient Served  N/A    Visit Setting with Client Phone/Text/Email    Patient Status Unhoused    Insurance Medicaid    Insurance/Financial Assistance Referral N/A    Medication N/A    Medical Provider Yes    Screening Referrals Made N/A    Medical Referrals Made N/A    Medical Appointment Made N/A    Recently w/o PCP, now 1st time PCP visit completed due to CNs referral or appointment made N/A    Food Have Food Insecurities    Transportation Need transportation assistance    Housing/Utilities No permanent housing    Interpersonal Safety N/A    Interventions Case Management    Abnormal to Normal Screening Since Last CN Visit N/A    Screenings CN Performed N/A    Sent Client to Lab for: N/A    Did client attend any of the following based off CNs referral or appointments made? N/A    ED Visit Averted N/A    Life-Saving Intervention Made N/A            Previous phone call made was to sister.  After multiple calls found patient registering with [facility] and was able to speak to patient .Tenna Delaine gave me her cell number  [phone].Marland Kitchen Patient ask for RN to call @ 0830. RN agreed.  Lashunda Greis [employer] RN BSN CCNP Cell [phone] Dept: [phone]   Congregational Nurse  Program Note  Date of Encounter: 11/27/2021  Past Medical History: Past Medical History:  Diagnosis Date   Acute respiratory failure with hypoxia and hypercarbia ([place])    AKI (acute kidney injury) ([place])    Community acquired pneumonia    Demand ischemia ([place])    Diarrhea    Hypercapnic respiratory failure ([place]) 06/25/2020   Hypoglycemia    Hypokalemia    Morbid obesity ([place])    Normocytic anemia    OSA treated with BiPAP    Pulmonary embolism ([place])    Transaminitis     Encounter Details:  CNP Questionnaire - 11/27/21 1733       Questionnaire   Ask client: Do you give verbal consent for me to treat you today? Yes    Student Assistance N/A    Location Patient Served  N/A    Visit Setting with Client Phone/Text/Email    Patient Status Unhoused    Insurance Medicaid    Insurance/Financial Assistance Referral N/A    Medication N/A    Medical Provider Yes    Screening Referrals Made N/A    Medical Referrals Made N/A    Medical Appointment Made N/A    Recently w/o PCP, now 1st time PCP  visit completed due to CNs referral or appointment made N/A    Food Have Food Insecurities    Transportation Need transportation assistance    Housing/Utilities No permanent housing    Interpersonal Safety N/A    Interventions Case Management    Abnormal to Normal Screening Since Last CN Visit N/A    Screenings CN Performed N/A    Sent Client to Lab for: N/A    Did client attend any of the following based off CNs referral or appointments made? N/A    ED Visit Averted N/A    Life-Saving Intervention Made N/A

## 2021-11-27 NOTE — Congregational Nurse Program (Signed)
  Dept: [phone]   Congregational Nurse Program Note  Date of Encounter: 11/27/2021  Past Medical History: Past Medical History:  Diagnosis Date   Acute respiratory failure with hypoxia and hypercarbia (HCC)    AKI (acute kidney injury) ([place])    Community acquired pneumonia    Demand ischemia ([place])    Diarrhea    Hypercapnic respiratory failure ([place]) 06/25/2020   Hypoglycemia    Hypokalemia    Morbid obesity (HCC)    Normocytic anemia    OSA treated with BiPAP    Pulmonary embolism ([place])    Transaminitis     Encounter Details:  CNP Questionnaire - 11/27/21 1601       Questionnaire   Ask client: Do you give verbal consent for me to treat you today? N/A    Student Assistance N/A    Location Patient Served  N/A    Visit Setting with Client Phone/Text/Email    Patient Status Unhoused    Insurance Medicaid    Insurance/Financial Assistance Referral N/A    Medication Have Medication Insecurities    Medical Provider Yes    Screening Referrals Made N/A    Medical Referrals Made N/A    Medical Appointment Made N/A    Recently w/o PCP, now 1st time PCP visit completed due to CNs referral or appointment made N/A    Food Have Food Insecurities    Transportation Need transportation assistance    Housing/Utilities No permanent housing    Interpersonal Safety N/A    Interventions Case Management    Abnormal to Normal Screening Since Last CN Visit N/A    Screenings CN Performed N/A    Sent Client to Lab for: N/A    Did client attend any of the following based off CNs referral or appointments made? N/A    ED Visit Averted N/A    Life-Saving Intervention Made N/A            Phoned patient with no answer leaving a message for patient to please return my call.  Will continue to follow up.  Morrie Sheldon, RN BSN [place] Cell [phone]

## 2021-11-27 NOTE — TOC Progression Note (Signed)
Transition of Care [facility]) - Progression Note    Patient Details  Name: Felicia Warren MRN: [id number] Date of Birth: Dec 10, 1970  [facility]) CM/SW Contact  Joaquin Courts, RN Phone Number: 11/27/2021, 1:08 PM  Clinical Narrative:    [facility] supervisor completed hotel booking for patient for a two week stay, patient's application submitted for the [facility] and referred to congregational nurse program, congregational nurse to outreach to patient directly to schedule visit.   Expected Discharge Plan:  (TBD) Barriers to Discharge: Continued Medical Work up  Expected Discharge Plan and Services Expected Discharge Plan:  (TBD)   Discharge Planning Services: CM Consult Post Acute Care Choice:  (ALF) Living arrangements for the past 2 months: Homeless                 DME Arranged: Walker rolling DME Agency: [employer] Date DME Agency Contacted: 11/16/21 Time DME Agency Contacted: [phone] Representative spoke with at DME Agency: Jodell Cipro             Social Determinants of Health ([place]) Interventions Housing Interventions: Inpatient TOC  Readmission Risk Interventions     No data to display

## 2021-11-30 ENCOUNTER — Telehealth: Payer: Self-pay

## 2021-11-30 NOTE — Telephone Encounter (Addendum)
Transition Care Management Follow-up Telephone Call Date of discharge and from where: 11/27/2021, [facility] How have you been since you were released from the hospital? She said is doing okay but she was upset with how she was discharged.  She feels as if she was just pushed out of the hospital. Nothing was well planned. She thought she was going to ALF but that fell through.  She is currently at [employer] with [facility] paying for her to stay there for 2 weeks. She is not sure what she is going to do after that. She is meeting with Morrie Sheldon, RN/CNP tomorrow to discuss plans for housing as well a community resources.  She is still interested in ALF if appropriate. She does not have an income so she is not able to pay for a place of her own.  She has also been in contact with [employer] regarding her housing needs. Tomorrow she plans to go to DSS to inquire about resources for housing support.  Any questions or concerns? Yes-  noted above.   Items Reviewed: Did the pt receive and understand the discharge instructions provided? Yes  Medications obtained and verified?  She said she has the furosemide, potassium and albuterol neb solution.  Other? No  Any new allergies since your discharge? No  Dietary orders reviewed? No Do you have support at home?  Living in motel.  She said she can't ask for any more support from friends   Home Care and Equipment/Supplies: Were home health services ordered? no If so, what is the name of the agency? N/a  Has the agency set up a time to come to the patient's home? not applicable Were any new equipment or medical supplies ordered?  Yes: rollator, nebulizer What is the name of the medical supply agency? She received them from the hospital  Were you able to get the supplies/equipment? She received the nebulizer but said that the walker with wheels is not what she ordered and [facility] is working on getting her rollator.  I explained to  her that it is very important that she keep her appointment at [facility] this week because her insurance company will need documentation from a provider to support the need for the DME.  She has not seen a PCP is over a year. Do you have any questions related to the use of the equipment or supplies? No  Functional Questionnaire: (I = Independent and D = Dependent) ADLs: has cane to use as needed.  Independent with personal care. She has a CPAP machine   Follow up appointments reviewed:  PCP Hospital f/u appt confirmed? Yes  Scheduled to see Vena Rua, NP at [facility] - 12/04/2021.  [facility] f/u appt confirmed?  None scheduled at this time   Are transportation arrangements needed? Yes - she needs to call [facility] Medicaid to arrange a ride. She said she has the phone number for [facility]  If their condition worsens, is the pt aware to call PCP or go to the Emergency Dept.? Yes Was the patient provided with contact information for the PCP's office or ED? Yes Was to pt encouraged to call back with questions or concerns? Yes

## 2021-11-30 NOTE — Telephone Encounter (Signed)
Transition Care Management Unsuccessful Follow-up Telephone Call  Date of discharge and from where:  11/27/2021, [facility]  Attempts:  1st Attempt  Reason for unsuccessful TCM follow-up call:  Left voice message [phone], call back requested.   Patient has follow up appointment scheduled at [facility]- 12/04/2021.

## 2021-12-02 NOTE — Congregational Nurse Program (Signed)
  Dept: [phone]   Congregational Nurse Program Note  Date of Encounter: 12/02/2021  Past Medical History: Past Medical History:  Diagnosis Date   Acute respiratory failure with hypoxia and hypercarbia (HCC)    AKI (acute kidney injury) ([place])    Community acquired pneumonia    Demand ischemia    Diarrhea    Hypercapnic respiratory failure ([place]) 06/25/2020   Hypoglycemia    Hypokalemia    Morbid obesity (HCC)    Normocytic anemia    OSA treated with BiPAP    Pulmonary embolism ([place])    Transaminitis     Encounter Details:  CNP Questionnaire - 12/02/21 0934       Questionnaire   Ask client: Do you give verbal consent for me to treat you today? Yes    Student Assistance [employer] Nurse    Location Patient Served  [facility]    Visit Setting with Client Organization    Patient Status Unhoused    Insurance Medicaid    Insurance/Financial Assistance Referral N/A    Medication Have Medication Insecurities    Screening Referrals Made N/A    Medical Referrals Made N/A    Medical Appointment Made N/A    Recently w/o PCP, now 1st time PCP visit completed due to CNs referral or appointment made N/A    Food Have Food Insecurities    Transportation Need transportation assistance    Housing/Utilities No permanent housing    Interpersonal Safety N/A    Interventions Advocate/Support;Navigate Healthcare System;Case Management;Counsel;Educate    Abnormal to Normal Screening Since Last CN Visit N/A    Screenings CN Performed Blood Pressure;Pulse Ox    Sent Client to Lab for: N/A    Did client attend any of the following based off CNs referral or appointments made? Yes    Life-Saving Intervention Made N/A           Post hospital visit while in [place] thru the 20th. Patient states she does not want to sign up at the [facility] at this time however, she must in order to see MSW. Patient going to try see MSW before she has to leave for 1230 going to [facility]. Will continue to follow up.  Morrie Sheldon, RN  BSN [facility] [phone]

## 2021-12-04 ENCOUNTER — Ambulatory Visit (INDEPENDENT_AMBULATORY_CARE_PROVIDER_SITE_OTHER): Payer: Medicaid Other | Admitting: Nurse Practitioner

## 2021-12-04 ENCOUNTER — Encounter: Payer: Self-pay | Admitting: Nurse Practitioner

## 2021-12-04 ENCOUNTER — Other Ambulatory Visit: Payer: Self-pay

## 2021-12-04 VITALS — BP 122/58 | HR 78 | Temp 98.0°F | Ht 64.0 in | Wt 360.0 lb

## 2021-12-04 DIAGNOSIS — J9621 Acute and chronic respiratory failure with hypoxia: Secondary | ICD-10-CM | POA: Diagnosis not present

## 2021-12-04 DIAGNOSIS — J9602 Acute respiratory failure with hypercapnia: Secondary | ICD-10-CM | POA: Insufficient documentation

## 2021-12-04 DIAGNOSIS — I5032 Chronic diastolic (congestive) heart failure: Secondary | ICD-10-CM

## 2021-12-04 DIAGNOSIS — E119 Type 2 diabetes mellitus without complications: Secondary | ICD-10-CM | POA: Diagnosis not present

## 2021-12-04 DIAGNOSIS — Z09 Encounter for follow-up examination after completed treatment for conditions other than malignant neoplasm: Secondary | ICD-10-CM | POA: Insufficient documentation

## 2021-12-04 DIAGNOSIS — J9622 Acute and chronic respiratory failure with hypercapnia: Secondary | ICD-10-CM

## 2021-12-04 DIAGNOSIS — Z6841 Body Mass Index (BMI) 40.0 and over, adult: Secondary | ICD-10-CM | POA: Diagnosis not present

## 2021-12-04 MED ORDER — BARIATRIC ROLLATOR MISC: 1.0000 | Freq: Every day | 0 refills | Status: DC

## 2021-12-04 MED ORDER — ALBUTEROL SULFATE HFA 108 (90 BASE) MCG/ACT IN AERS: 2.0000 | INHALATION_SPRAY | Freq: Four times a day (QID) | RESPIRATORY_TRACT | 1 refills | Status: DC | PRN

## 2021-12-04 MED ORDER — BARIATRIC ROLLATOR MISC: 1.0000 | Freq: Every day | 0 refills | Status: AC

## 2021-12-04 NOTE — Progress Notes (Signed)
Established Patient Office Visit  Subjective:  Patient ID: Felicia Warren, female    DOB: 03/21/1970  Age: 51 y.o. MRN: [id number]  CC:  Chief Complaint  Patient presents with   Establish Care    ER last week. Respiratory failure. Doing good since. Making sure she is using her cpap machine so it doesn't happen again. Is interested in inhaler instead of nebulizer because that is not convenient for her.     HPI Felicia Warren is a 51 y.o. female with past medical history of chronic diastolic CHF, chronic respiratory failure with hypoxia and hypercapnia, obstructive sleep apnea on CPAP, type 2 diabetes, obesity, who presents for hospital discharge follow-up and  to establish care for her chronic medical conditions. Has no previous PCP.   Chronic respiratory failure with hypoxia and hypercapnia/obstructive sleep apnea.  She was admitted in the hospital from October 19, 2021 to November 27, 2021.  She currently denies shortness of breath, wheezing.  Currently on room air uses CPAP at night.  She required intubation in vent support while in the hospital.  She has a DME nebulizer at home which she has not required since she left the hospital.  Requesting for albuterol inhaler today. Needs referral to pulmonology for follow-up   Super morbid Obesity /impaired mobility would like a rollator instead of a walker.  She is currently using a four-wheel walker  Due for flu vaccine Tdap vaccine shingles vaccine.  Need for all vaccines discussed with the patient.  Patient declined flu vaccine today.  She was encouraged to consider getting the vaccines  Due for colon cancer screening, she declined referral for colonoscopy or Cologuard testing today.  We discussed getting her Pap exam done at her next visit.  She declined referral for screening mammogram.     Past Medical History:  Diagnosis Date   Acute respiratory failure with hypoxia and hypercarbia (HCC)    AKI (acute kidney injury) ([place])    Community  acquired pneumonia    Demand ischemia    Diarrhea    Hypercapnic respiratory failure ([place]) 06/25/2020   Hypoglycemia    Hypokalemia    Morbid obesity (HCC)    Normocytic anemia    OSA treated with BiPAP    Pulmonary embolism ([place])    Transaminitis     History reviewed. No pertinent surgical history.  Family History  Problem Relation Age of Onset   Diabetes type II Mother    Thyroid disease Mother    Hypertension Mother    Diabetes type II Father    Diabetes Father    Hypertension Sister    Colon cancer Neg Hx    Breast cancer Neg Hx     Social History   Socioeconomic History   Marital status: Single    Spouse name: Not on file   Number of children: 0   Years of education: Not on file   Highest education level: Not on file  Occupational History   Not on file  Tobacco Use   Smoking status: Never   Smokeless tobacco: Never  Vaping Use   Vaping Use: Never used  Substance and Sexual Activity   Alcohol use: Not Currently   Drug use: Not Currently   Sexual activity: Not Currently    Birth control/protection: None  Other Topics Concern   Not on file  Social History Narrative   Living in a hotel currently    Social Determinants of Health   Financial Resource Strain: Not on file  Food  Insecurity: Food Insecurity Present (10/29/2021)   Hunger Vital Sign    Worried About Running Out of Food in the Last Year: Sometimes true    Ran Out of Food in the Last Year: Sometimes true  Transportation Needs: Unmet Transportation Needs (10/29/2021)   PRAPARE - Hydrologist (Medical): No    Lack of Transportation (Non-Medical): Yes  Physical Activity: Not on file  Stress: Not on file  Social Connections: Not on file  Intimate Partner Violence: Not At Risk (10/29/2021)   Humiliation, Afraid, Rape, and Kick questionnaire    Fear of Current or Ex-Partner: No    Emotionally Abused: No    Physically Abused: No    Sexually Abused: No    Outpatient  Medications Prior to Visit  Medication Sig Dispense Refill   diphenhydrAMINE-zinc acetate (BENADRYL) cream Apply topically 2 (two) times daily as needed for itching. 28.4 g 0   docusate sodium (COLACE) 100 MG capsule Take 1 capsule (100 mg total) by mouth 2 (two) times daily as needed for mild constipation. 10 capsule 0   Ensure Max Protein (ENSURE MAX PROTEIN) LIQD Take 330 mLs (11 oz total) by mouth 2 (two) times daily. 3330 mL 0   furosemide (LASIX) 40 MG tablet Take 1 tablet (40 mg total) by mouth 2 (two) times daily. 60 tablet 0   NON FORMULARY CPAP Machine     Nutritional Supplements (,FEEDING SUPPLEMENT, PROSOURCE PLUS) liquid Take 30 mLs by mouth 2 (two) times daily between meals. 887 mL 0   polyethylene glycol (MIRALAX / GLYCOLAX) 17 g packet Take 17 g by mouth daily as needed for moderate constipation. 14 each 0   potassium chloride SA (KLOR-CON M) 20 MEQ tablet Take 1 tablet (20 mEq total) by mouth 2 (two) times daily. 60 tablet 1   albuterol (PROVENTIL) (2.5 MG/3ML) 0.083% nebulizer solution Take 3 mLs (2.5 mg total) by nebulization every 4 (four) hours as needed for wheezing or shortness of breath. (Patient not taking: Reported on 12/04/2021) 90 mL 12   Multiple Vitamin (MULTIVITAMIN WITH MINERALS) TABS tablet Take 1 tablet by mouth daily. (Patient not taking: Reported on 10/19/2021)     pantoprazole (PROTONIX) 40 MG tablet Take 1 tablet (40 mg total) by mouth at bedtime. (Patient not taking: Reported on 10/19/2021) 30 tablet 2   No facility-administered medications prior to visit.    No Known Allergies  ROS Review of Systems  Constitutional: Negative.  Negative for activity change, appetite change, chills and diaphoresis.  Respiratory:  Negative for apnea, cough, choking, chest tightness, shortness of breath, wheezing and stridor.   Cardiovascular: Negative.  Negative for chest pain, palpitations and leg swelling.  Gastrointestinal: Negative.  Negative for abdominal pain, anal  bleeding and constipation.  Endocrine: Negative.   Musculoskeletal: Negative.   Neurological: Negative.  Negative for dizziness, facial asymmetry, light-headedness and headaches.  Psychiatric/Behavioral: Negative.  Negative for agitation, behavioral problems, confusion and decreased concentration.       Objective:    Physical Exam Constitutional:      General: She is not in acute distress.    Appearance: She is obese. She is not ill-appearing, toxic-appearing or diaphoretic.  Eyes:     General: No scleral icterus.       Right eye: No discharge.        Left eye: No discharge.     Extraocular Movements: Extraocular movements intact.     Conjunctiva/sclera: Conjunctivae normal.     Pupils: Pupils are  equal, round, and reactive to light.  Cardiovascular:     Rate and Rhythm: Normal rate and regular rhythm.     Heart sounds: Normal heart sounds. No murmur heard.    No friction rub. No gallop.  Pulmonary:     Effort: Pulmonary effort is normal. No respiratory distress.     Breath sounds: Normal breath sounds. No stridor. No wheezing, rhonchi or rales.  Chest:     Chest wall: No tenderness.  Abdominal:     Palpations: Abdomen is soft.     Tenderness: There is no abdominal tenderness.  Musculoskeletal:        General: No swelling or tenderness.     Right lower leg: No edema.     Left lower leg: No edema.  Skin:    Capillary Refill: Capillary refill takes less than 2 seconds.  Neurological:     Mental Status: She is alert and oriented to person, place, and time.     Motor: No weakness.     Coordination: Coordination normal.     Gait: Gait abnormal.  Psychiatric:        Mood and Affect: Mood normal.        Behavior: Behavior normal.        Thought Content: Thought content normal.        Judgment: Judgment normal.     BP (!) 122/58   Pulse 78   Temp 98 F (36.7 C) (Oral)   Ht 5' 4" (1.626 m)   Wt (!) 360 lb (163.3 kg)   SpO2 100%   BMI 61.79 kg/m  Wt Readings from  Last 3 Encounters:  12/04/21 (!) 360 lb (163.3 kg)  11/25/21 (!) 343 lb 14.7 oz (156 kg)  09/17/20 (!) 391 lb (177.4 kg)    Lab Results  Component Value Date   TSH 3.980 10/24/2021   Lab Results  Component Value Date   WBC 7.8 11/27/2021   HGB 10.9 (L) 11/27/2021   HCT 39.1 11/27/2021   MCV 81.8 11/27/2021   PLT 187 11/27/2021   Lab Results  Component Value Date   NA 138 11/27/2021   K 4.2 11/27/2021   CO2 28 11/27/2021   GLUCOSE 101 (H) 11/27/2021   BUN 27 (H) 11/27/2021   CREATININE 0.83 11/27/2021   BILITOT 0.8 11/27/2021   ALKPHOS 105 11/27/2021   AST 18 11/27/2021   ALT 11 11/27/2021   PROT 6.6 11/27/2021   ALBUMIN 3.3 (L) 11/27/2021   CALCIUM 8.7 (L) 11/27/2021   ANIONGAP 8 11/27/2021   EGFR 73 09/17/2020   GFR 77.93 09/08/2020   Lab Results  Component Value Date   CHOL 237 (H) 09/17/2020   Lab Results  Component Value Date   HDL 76 09/17/2020   Lab Results  Component Value Date   LDLCALC 137 (H) 09/17/2020   Lab Results  Component Value Date   TRIG 157 (H) 10/21/2021   Lab Results  Component Value Date   CHOLHDL 3.1 09/17/2020   Lab Results  Component Value Date   HGBA1C 6.2 (H) 06/18/2021      Assessment & Plan:   Problem List Items Addressed This Visit       Cardiovascular and Mediastinum   Chronic diastolic CHF (congestive heart failure) (HCC)    Currently on furosemide 40 mg twice daily Denies shortness of breath No edema noted on examination today Continue current medications Check CMP, magnesium, phosphorus DASH diet advised I discussed referral to cardiology with the patient, she  would like to hold off on referral for now.       Relevant Medications   Misc. Devices (BARIATRIC ROLLATOR) MISC     Respiratory   Acute on chronic respiratory failure with hypoxia and hypercapnia (HCC)    History of right heart failure, obstructive sleep apnea Currently on room air.  Uses CPAP at night Has not required her albuterol  nebulizer since she has was discharged from the hospital  Albuterol inhaler ordered today, use 2 puffs every 6 hours as needed for shortness of breath Patient referred to pulmonology as recommended. Follow-up chest x-ray ordered to be done in 3 to 4 weeks      Relevant Orders   CBC   CMP14+EGFR   Magnesium   Phosphorus   Ambulatory referral to Pulmonology   DG Chest 2 View     Endocrine   Type 2 diabetes mellitus without complication ([place]) - Primary    Lab Results  Component Value Date   HGBA1C 6.2 (H) 06/18/2021  Diet controlled Avoid sugar sweets soda Check A1c today Referral sent to ophthalmologist for diabetic eye exam Plans for  foot exam at next visit      Relevant Medications   Misc. Devices (BARIATRIC ROLLATOR) MISC   Other Relevant Orders   Ambulatory referral to Ophthalmology   Hemoglobin A1c   Lipid Panel   TSH     Other   BMI 60.0-69.9, adult (HCC)    Current BMI 61.79 Counseled on low-carb diet to loose weight Check TSH  Will discuss bariatric surgery referral at next visit       Meds ordered this encounter  Medications   DISCONTD: albuterol (VENTOLIN HFA) 108 (90 Base) MCG/ACT inhaler    Sig: Inhale 2 puffs into the lungs every 6 (six) hours as needed for wheezing or shortness of breath.    Dispense:  8 g    Refill:  1   DISCONTD: albuterol (VENTOLIN HFA) 108 (90 Base) MCG/ACT inhaler    Sig: Inhale 2 puffs into the lungs every 6 (six) hours as needed for wheezing or shortness of breath.    Dispense:  8 g    Refill:  1   DISCONTD: Misc. Devices (BARIATRIC ROLLATOR) MISC    Sig: 1 each by Does not apply route daily.    Dispense:  1 each    Refill:  0   Misc. Devices (BARIATRIC ROLLATOR) MISC    Sig: 1 each by Does not apply route daily.    Dispense:  1 each    Refill:  0    Follow-up: Return in about 2 months (around 02/03/2022) for CPE.    Renee Rival, FNP

## 2021-12-04 NOTE — Assessment & Plan Note (Signed)
History of right heart failure, obstructive sleep apnea Currently on room air.  Uses CPAP at night Has not required her albuterol nebulizer since she has was discharged from the hospital  Albuterol inhaler ordered today, use 2 puffs every 6 hours as needed for shortness of breath Patient referred to pulmonology as recommended. Follow-up chest x-ray ordered to be done in 3 to 4 weeks

## 2021-12-04 NOTE — Assessment & Plan Note (Signed)
Lab Results  Component Value Date   HGBA1C 6.2 (H) 06/18/2021  Diet controlled Avoid sugar sweets soda Check A1c today Referral sent to ophthalmologist for diabetic eye exam Plans for  foot exam at next visit

## 2021-12-04 NOTE — Assessment & Plan Note (Signed)
Chronic respiratory failure with hypoxia and hypercapnia/obstructive sleep apnea.  She was admitted in the hospital from October 19, 2021 to November 27, 2021.  She currently denies shortness of breath, wheezing.  Currently on room air uses CPAP at night.  She required intubation in vent support while in the hospital.  Hospital discharge summary, labs, imaging studies reviewed Patient referred to pulmonology Check magnesium, phosphorus, CBC, CMP

## 2021-12-04 NOTE — Assessment & Plan Note (Addendum)
Currently on furosemide 40 mg twice daily Denies shortness of breath No edema noted on examination today Continue current medications Check CMP, magnesium, phosphorus DASH diet advised I discussed referral to cardiology with the patient, she would like to hold off on referral for now.

## 2021-12-04 NOTE — Patient Instructions (Signed)
Please get your chest xray done as dicussed, you have been referred to pulmonology . We will get back to you about your labs results   Use albuterol inhaler 2 puffs every 6 hours as needed   Please consider getting your flu vaccine, TDAP vaccine ans shingles vaccine   It is important that you exercise regularly at least 30 minutes 5 times a week as tolerated  Think about what you will eat, plan ahead. Choose " clean, green, fresh or frozen" over canned, processed or packaged foods which are more sugary, salty and fatty. 70 to 75% of food eaten should be vegetables and fruit. Three meals at set times with snacks allowed between meals, but they must be fruit or vegetables. Aim to eat over a 12 hour period , example 7 am to 7 pm, and STOP after  your last meal of the day. Drink water,generally about 64 ounces per day, no other drink is as healthy. Fruit juice is best enjoyed in a healthy way, by EATING the fruit.  Thanks for choosing [facility] we consider it a privelige to serve you.

## 2021-12-04 NOTE — Assessment & Plan Note (Signed)
Current BMI 61.79 Counseled on low-carb diet to loose weight Check TSH  Will discuss bariatric surgery referral at next visit

## 2021-12-05 LAB — CMP14+EGFR
ALT: 12 IU/L (ref 0–32)
AST: 23 IU/L (ref 0–40)
Albumin/Globulin Ratio: 1.3 (ref 1.2–2.2)
Albumin: 4.2 g/dL (ref 3.8–4.9)
Alkaline Phosphatase: 142 IU/L — ABNORMAL HIGH (ref 44–121)
BUN/Creatinine Ratio: 18 (ref 9–23)
BUN: 17 mg/dL (ref 6–24)
Bilirubin Total: 0.7 mg/dL (ref 0.0–1.2)
CO2: 24 mmol/L (ref 20–29)
Calcium: 9.6 mg/dL (ref 8.7–10.2)
Chloride: 100 mmol/L (ref 96–106)
Creatinine, Ser: 0.95 mg/dL (ref 0.57–1.00)
Globulin, Total: 3.3 g/dL (ref 1.5–4.5)
Glucose: 49 mg/dL — ABNORMAL LOW (ref 70–99)
Potassium: 4.5 mmol/L (ref 3.5–5.2)
Sodium: 141 mmol/L (ref 134–144)
Total Protein: 7.5 g/dL (ref 6.0–8.5)
eGFR: 72 mL/min/{1.73_m2} (ref >59)

## 2021-12-05 LAB — LIPID PANEL
Chol/HDL Ratio: 2.8 ratio (ref 0.0–4.4)
Cholesterol, Total: 208 mg/dL — ABNORMAL HIGH (ref 100–199)
HDL: 75 mg/dL (ref >39)
LDL Chol Calc (NIH): 112 mg/dL — ABNORMAL HIGH (ref 0–99)
Triglycerides: 119 mg/dL (ref 0–149)
VLDL Cholesterol Cal: 21 mg/dL (ref 5–40)

## 2021-12-05 LAB — MAGNESIUM: Magnesium: 2 mg/dL (ref 1.6–2.3)

## 2021-12-05 LAB — CBC
Hematocrit: 43.2 % (ref 34.0–46.6)
Hemoglobin: 12.5 g/dL (ref 11.1–15.9)
MCH: 22.9 pg — ABNORMAL LOW (ref 26.6–33.0)
MCHC: 28.9 g/dL — ABNORMAL LOW (ref 31.5–35.7)
MCV: 79 fL (ref 79–97)
Platelets: 226 10*3/uL (ref 150–450)
RBC: 5.45 x10E6/uL — ABNORMAL HIGH (ref 3.77–5.28)
RDW: 22.1 % — ABNORMAL HIGH (ref 11.7–15.4)
WBC: 6.2 10*3/uL (ref 3.4–10.8)

## 2021-12-05 LAB — HEMOGLOBIN A1C
Est. average glucose Bld gHb Est-mCnc: 128 mg/dL
Hgb A1c MFr Bld: 6.1 % — ABNORMAL HIGH (ref 4.8–5.6)

## 2021-12-05 LAB — TSH: TSH: 3.27 u[IU]/mL (ref 0.450–4.500)

## 2021-12-05 LAB — PHOSPHORUS: Phosphorus: 4.7 mg/dL — ABNORMAL HIGH (ref 3.0–4.3)

## 2021-12-07 NOTE — Progress Notes (Signed)
Blood count is stable ,  Liver enzyme is slightly elevated, avoid alcohol , excessive tylenol use   A1C is stable, avoid sugar  sweet ,soda   Eat a healthy diet, including lots of fruits and vegetables. Avoid foods with a lot of saturated and trans fats, such as red meat, butter, fried foods and cheese . Maintain a healthy weight.  Phosphorous level is improving, kidney function is normal , thyroid normal and calcium level is normal. We will recheck phosphorous labs at next visit   Continue current medications

## 2021-12-11 ENCOUNTER — Telehealth: Payer: Self-pay | Admitting: Clinical

## 2021-12-11 NOTE — Telephone Encounter (Signed)
Integrated Behavioral Health Case Management Referral Note  12/11/2021 Name: Felicia Warren MRN: [id number] DOB: Apr 19, 1970 Felicia Warren is a 51 y.o. year old female who sees Elsie Stain, MD for primary care. LCSW was consulted to assess patient's needs and assist the patient with housing resources.   Interpreter: No.   Interpreter Name & Language: none  Assessment: Patient experiencing unstable housing. She was placed in hotel with [employer] temporarily. Patient was referred by Vena Rua after PCP visit.   Intervention: CSW called congregational nurse, Morrie Sheldon, who was following patient while in hotel. Mitzi indicated patient came to [facility] recently to get connected to resources. Patient was likely connected with [facility] during that visit. CSW called patient to follow up on referral. Patient indicated she had visited the [facility] and met with someone who filled out a lot of paperwork with her. She hasn't heard back from them yet. CSW to follow up with Mitzi and [facility] to determine if patient has been connected with [facility].   Review of patient status, including review of consultants reports, relevant laboratory and other test results, and collaboration with appropriate care team members and the patient's provider was performed as part of comprehensive patient evaluation and provision of services.    Estanislado Emms, [facility] [phone]

## 2021-12-14 ENCOUNTER — Other Ambulatory Visit (HOSPITAL_COMMUNITY): Payer: Self-pay

## 2021-12-15 ENCOUNTER — Other Ambulatory Visit (HOSPITAL_COMMUNITY): Payer: Self-pay

## 2021-12-22 ENCOUNTER — Encounter: Payer: Self-pay | Admitting: Pulmonary Disease

## 2021-12-22 ENCOUNTER — Ambulatory Visit (INDEPENDENT_AMBULATORY_CARE_PROVIDER_SITE_OTHER): Payer: Medicaid Other | Admitting: Pulmonary Disease

## 2021-12-22 ENCOUNTER — Ambulatory Visit: Payer: Medicaid Other

## 2021-12-22 VITALS — BP 130/68 | HR 86 | Ht 62.0 in | Wt 371.2 lb

## 2021-12-22 DIAGNOSIS — Z86711 Personal history of pulmonary embolism: Secondary | ICD-10-CM | POA: Diagnosis not present

## 2021-12-22 NOTE — Progress Notes (Signed)
@Patient  ID: , female    DOB: 03-Nov-1970, 51 y.o.   MRN: [id number]  Chief Complaint  Patient presents with   Follow-up    Follow up for osa, acute res failure and pulm embolism. Pt states she is still on her bipap with no issues noted. Pt states she is still on her albuterol as needed     Referring provider: [id number], FNP  HPI:   51 year old woman whom we are seeing in follow-up for evaluation of pulmonary embolus and for evaluation of chronic hypercarbic respiratory failure. Most recent PCP note reviewed.  H&P and discharge summary from hospitalization August through October 2023 reviewed.  Patient returns long overdue for follow-up.  Last seen 08/2020.  Requested short interval follow-up.  Had ordered test to evaluate sequela submassive PE.  She indicates she cannot do these.  I encouraged her to do so to document a telephone encounter.  Subsequently lost to follow-up.  She was recent hospitalized 11/27/2021 in the setting of hypercarbic and hypoxemic respiratory failure.  Felt to be combination of heart failure and possible exacerbation of obstructive lung disease.  Notably no PFTs to diagnose COPD.  She was intubated for period time and subsequent recovered.  She is awake her breathing is improved.  Doing okay.  Reports good adherence to medications.  Review of CTA PE protocol 05/2021 demonstrates no central pulm embolus but is very degraded due to motion artifact on my interpretation.  Echocardiogram 05/2021 demonstrates RV dilation with normal function and normal estimated PASP.  Echocardiogram 09/2021 reveals reduced RV function and normal estimated PASP.  HPI at initial visit: Patient was admitted after being found down in a motel room.  PCO2 elevated.  CT scan on admission demonstrated pulmonary embolus.  No clear provoking factor.  Largest risk factor is morbid obesity.  She was placed on BiPAP.  Gradually improved with BiPAP therapy and treatment of PE.  TTE  revealed RV dysfunction.  Discharged with a BiPAP machine.  She continues to use this.  She was directed to the take blood thinners for at least 3 months.  She stopped this about a month ago, after only about 2 months of therapy.  She states it was too expensive.  States she did not have refills.  Appears refills were available.  She continues take Lasix.  She was noted to be volume overloaded in the hospital.  She stopped her potassium supplementation.  Today she feels okay.  A bit better than in the hospital.  Still some dyspnea on exertion.  Reviewed CT images 06/2020 That Demonstrated Low Lung Volumes, Scattered Streaky Infiltrates in the Bases Most Likely Atelectasis with Scattered Peribronchovascular Groundglass Opacities on My Interpretation.  Most Recent Chest Imaging 07/05/2020 Is a Chest X-Ray Which Demonstrates Low Lung Volumes, Habitus Precludes Accurate Interpretation but Appears to Have Bilateral Hilar to Midlung Field Infiltrates Most Likely Consistent with Pulmonary Edema on My Interpretation.  PMH: Morbid obesity, volume overload, PE, OSA/OHS Surgical history: History reviewed. No pertinent surgical history. Family history: Family History  Problem Relation Age of Onset   Diabetes type II Mother    Thyroid disease Mother    Hypertension Mother    Diabetes type II Father    Diabetes Father    Hypertension Sister    Colon cancer Neg Hx    Breast cancer Neg Hx    Social history: Recently moved from 07/07/2020, lives in [place], never smoker  [place] / Pulmonary Flowsheets:   ACT:  No data to display          MMRC:     No data to display          [place]:      No data to display          Tests:   FENO:  No results found for: "NITRICOXIDE"  PFT:     No data to display          WALK:      No data to display          Imaging: Personally reviewed and as per EMR  Lab Results: Personally reviewed CBC    Component Value  Date/Time   WBC 6.2 12/04/2021 0938   WBC 7.8 11/27/2021 0506   RBC 5.45 (H) 12/04/2021 0938   RBC 4.78 11/27/2021 0506   HGB 12.5 12/04/2021 0938   HCT 43.2 12/04/2021 0938   PLT 226 12/04/2021 0938   MCV 79 12/04/2021 0938   MCH 22.9 (L) 12/04/2021 0938   MCH 22.8 (L) 11/27/2021 0506   MCHC 28.9 (L) 12/04/2021 0938   MCHC 27.9 (L) 11/27/2021 0506   RDW 22.1 (H) 12/04/2021 0938   LYMPHSABS 1.5 11/27/2021 0506   MONOABS 0.5 11/27/2021 0506   EOSABS 0.5 11/27/2021 0506   BASOSABS 0.0 11/27/2021 0506    BMET    Component Value Date/Time   NA 141 12/04/2021 0935   K 4.5 12/04/2021 0935   CL 100 12/04/2021 0935   CO2 24 12/04/2021 0935   GLUCOSE 49 (L) 12/04/2021 0935   GLUCOSE 101 (H) 11/27/2021 0506   BUN 17 12/04/2021 0935   CREATININE 0.95 12/04/2021 0935   CALCIUM 9.6 12/04/2021 0935   GFRNONAA >60 11/27/2021 0506    BNP    Component Value Date/Time   BNP 778.8 (H) 10/19/2021 1837    ProBNP No results found for: "PROBNP"  Specialty Problems       Pulmonary Problems   Acute on chronic respiratory failure with hypoxia and hypercapnia (HCC)   Acute respiratory failure with hypoxia (HCC)   OSA/OHS on CPAP   Obesity hypoventilation syndrome and obstructive sleep apnea   Acute respiratory failure with hypercapnia ([place])    No Known Allergies  Immunization History  Administered Date(s) Administered   PFIZER(Purple Top)SARS-COV-2 Vaccination 12/26/2019, 01/16/2020   PPD Test 11/16/2021    Past Medical History:  Diagnosis Date   Acute respiratory failure with hypoxia and hypercarbia (HCC)    AKI (acute kidney injury) ([place])    Community acquired pneumonia    Demand ischemia    Diarrhea    Hypercapnic respiratory failure ([place]) 06/25/2020   Hypoglycemia    Hypokalemia    Morbid obesity ([place])    Normocytic anemia    OSA treated with BiPAP    Pulmonary embolism (HCC)    Transaminitis     Tobacco History: Social History   Tobacco Use  Smoking Status  Never  Smokeless Tobacco Never   Counseling given: Not Answered   Continue to not smoke  Outpatient Encounter Medications as of 12/22/2021  Medication Sig   albuterol (PROVENTIL) (2.5 MG/3ML) 0.083% nebulizer solution Take 3 mLs (2.5 mg total) by nebulization every 4 (four) hours as needed for wheezing or shortness of breath.   albuterol (VENTOLIN HFA) 108 (90 Base) MCG/ACT inhaler Inhale 2 puffs into the lungs every 6 (six) hours as needed for wheezing or shortness of breath.   diphenhydrAMINE-zinc acetate (BENADRYL) cream Apply topically 2 (two) times daily as  needed for itching.   docusate sodium (COLACE) 100 MG capsule Take 1 capsule (100 mg total) by mouth 2 (two) times daily as needed for mild constipation.   Ensure Max Protein (ENSURE MAX PROTEIN) LIQD Take 330 mLs (11 oz total) by mouth 2 (two) times daily.   furosemide (LASIX) 40 MG tablet Take 1 tablet (40 mg total) by mouth 2 (two) times daily.   Misc. Devices (BARIATRIC ROLLATOR) MISC 1 each by Does not apply route daily.   NON FORMULARY CPAP Machine   Nutritional Supplements (,FEEDING SUPPLEMENT, PROSOURCE PLUS) liquid Take 30 mLs by mouth 2 (two) times daily between meals.   polyethylene glycol (MIRALAX / GLYCOLAX) 17 g packet Take 17 g by mouth daily as needed for moderate constipation.   potassium chloride SA (KLOR-CON M) 20 MEQ tablet Take 1 tablet (20 mEq total) by mouth 2 (two) times daily.   No facility-administered encounter medications on file as of 12/22/2021.     Review of Systems  Review of Systems  N/a Physical Exam  BP 130/68 (BP Location: Left Arm, Patient Position: Sitting, Cuff Size: Normal)   Pulse 86   Ht 5\' 2"  (1.575 m)   Wt (!) 371 lb 3.2 oz (168.4 kg)   SpO2 97%   BMI 67.89 kg/m   Wt Readings from Last 5 Encounters:  12/22/21 (!) 371 lb 3.2 oz (168.4 kg)  12/04/21 (!) 360 lb (163.3 kg)  11/25/21 (!) 343 lb 14.7 oz (156 kg)  09/17/20 (!) 391 lb (177.4 kg)  09/08/20 (!) 389 lb 3.2 oz  (176.5 kg)    BMI Readings from Last 5 Encounters:  12/22/21 67.89 kg/m  12/04/21 61.79 kg/m  11/25/21 64.98 kg/m  06/19/21 69.10 kg/m  09/17/20 67.11 kg/m     Physical Exam General: Obese, no acute distress Eyes: EOMI, no icterus Neck: Supple, no JVP appreciated, habitus makes it difficult to fully assess Cardiovascular: Regular in rhythm, no murmur Pulmonary: Distant, clear to auscultation bilaterally, no wheeze, normal work of breathing Abdomen: Nondistended, bowel sounds present Psych: Normal mood, full affect MSK: No synovitis, joint effusion   Assessment & Plan:   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  Felt to be combination of CHF exacerbation and possible exacerbation of OHS.  She reports good adherence to wearing BiPAP at night every night now.  In the past, she was not adherent to this prior to recent hospitalization.  Continue BiPAP for now.  Polysomnography ordered in the past was not completed, she did not do this.  Pulmonary embolus: 06/2020.  McConnell sign on echocardiogram.  At risk for chronic RV dysfunction in the setting of OHS/OSA.  Only completed 2 months of Xarelto due to cost.  TTE ordered at my evaluation 08/2020.  She did not follow-up with this.  Citing financial difficulties at the time.  Never followed up with me.  Subsequent TTE 05/2021 with RV dilation and normal estimated PASP, 09/2021 with reduced RV function and normal estimated PASP.  Nuclear medicine perfusion scan with chest x-ray ordered to evaluate presents of chronic thromboembolic disease.   Return in about 3 months (around 03/24/2022).   03/26/2022, MD 12/22/2021   This appointment required 45 minutes of patient care (this includes precharting, chart review, review of results, face-to-face care, etc.).

## 2021-12-22 NOTE — Patient Instructions (Signed)
Nice to see you again  I strongly recommend you continue using your BiPAP anytime you are asleep, I am glad you are doing this  I ordered a scan called a perfusion scan that is to check to make sure the blood clot you had in the past and the lungs have gotten better  Hopefully we get this done and results to you in the coming weeks.  Return to clinic in 3 months or sooner as needed with Dr. Silas Flood

## 2021-12-28 ENCOUNTER — Other Ambulatory Visit (HOSPITAL_COMMUNITY): Payer: Medicaid Other

## 2021-12-28 ENCOUNTER — Encounter (HOSPITAL_COMMUNITY): Payer: Medicaid Other

## 2022-01-04 ENCOUNTER — Other Ambulatory Visit (HOSPITAL_COMMUNITY): Payer: Medicaid Other

## 2022-01-04 ENCOUNTER — Encounter (HOSPITAL_COMMUNITY): Payer: Medicaid Other

## 2022-01-11 ENCOUNTER — Ambulatory Visit (HOSPITAL_COMMUNITY): Payer: Medicaid Other

## 2022-01-21 ENCOUNTER — Telehealth: Payer: Self-pay | Admitting: Pulmonary Disease

## 2022-01-21 ENCOUNTER — Ambulatory Visit (HOSPITAL_COMMUNITY)
Admission: RE | Admit: 2022-01-21 | Discharge: 2022-01-21 | Disposition: A | Discharge status: Home / Self Care | Payer: Medicaid Other | Source: Ambulatory Visit | Attending: Pulmonary Disease | Admitting: Pulmonary Disease

## 2022-01-21 DIAGNOSIS — Z86711 Personal history of pulmonary embolism: Secondary | ICD-10-CM

## 2022-01-21 NOTE — Telephone Encounter (Signed)
I called and spoke with Felicia Warren  She verbalized understanding  She states that she believes that [facility] will be able to accommodate this pt for perfusion scan  PCC's can you move the appt for this to [facility] and call the pt?   I was unsure if you needed a new order  Just let me know if so, thanks!

## 2022-01-21 NOTE — Telephone Encounter (Signed)
Got it resc for patient lmam scheduled for 01/21/2022 arrive at 1:30 [facility] entrance

## 2022-01-21 NOTE — Telephone Encounter (Signed)
Dr Judeth Horn- please advise if okay to order CTA since she is not able to have perfusion scan due to exceeding wt limit.

## 2022-01-21 NOTE — Telephone Encounter (Signed)
Marcelino Duster called to request an order for CT instead of the nuclear perfusion imaging because the patient's weight exceeds the limitation and this cannot be done.  She would like to know if the patient can get an order for the CT instead because they are able to accommodate the patient's weight on the table.  Patient is at the Nuclear office now and Marcelino Duster stated that she would need the new order asap.  Please advise and call to confirm at [phone]

## 2022-01-21 NOTE — Telephone Encounter (Signed)
I need a VQ scan - is there a location that can accommodate the weight?

## 2022-01-22 ENCOUNTER — Ambulatory Visit: Admission: RE | Admit: 2022-01-22 | Payer: Medicaid Other | Source: Ambulatory Visit

## 2022-01-22 NOTE — Telephone Encounter (Signed)
This was resc yesterday to [facility] and they verified that the table would be able to handle her weight not sure what happened

## 2022-01-22 NOTE — Telephone Encounter (Signed)
Called patient but she did not answer. Left message for her to call back.  

## 2022-02-01 NOTE — Telephone Encounter (Signed)
ATC patient again, call was sent to VM. Will close encounter since this was the 2nd attempt.

## 2022-02-03 ENCOUNTER — Ambulatory Visit: Payer: Self-pay | Admitting: Nurse Practitioner

## 2022-02-11 ENCOUNTER — Encounter: Payer: Self-pay | Admitting: Nurse Practitioner

## 2022-02-11 ENCOUNTER — Ambulatory Visit (INDEPENDENT_AMBULATORY_CARE_PROVIDER_SITE_OTHER): Payer: Medicaid Other | Admitting: Nurse Practitioner

## 2022-02-11 VITALS — BP 177/95 | HR 87 | Ht 62.0 in | Wt 372.0 lb

## 2022-02-11 DIAGNOSIS — E876 Hypokalemia: Secondary | ICD-10-CM | POA: Diagnosis not present

## 2022-02-11 DIAGNOSIS — I5032 Chronic diastolic (congestive) heart failure: Secondary | ICD-10-CM | POA: Diagnosis not present

## 2022-02-11 MED ORDER — FUROSEMIDE 40 MG PO TABS: 40.0000 mg | ORAL_TABLET | Freq: Two times a day (BID) | ORAL | 0 refills | Status: DC

## 2022-02-11 NOTE — Progress Notes (Signed)
@Patient  ID: , female    DOB: August 17, 1970, 51 y.o.   MRN: [id number]  Chief Complaint  Patient presents with   Follow-up    Referring provider: [id number], MD   HPI  Felicia Warren is a 51 y.o. female with past medical history of chronic diastolic CHF, chronic respiratory failure with hypoxia and hypercapnia, obstructive sleep apnea on CPAP, type 2 diabetes, obesity, who presents for hospital discharge follow-up and  to establish care for her chronic medical conditions.    Patient presents today for a follow-up visit.  She was last seen by 40 here in our office.  Phosphorus levels have recently been elevated.  They have been trending down to normal today.  Patient will need this rechecked today. Denies f/c/s, n/v/d, hemoptysis, PND, leg swelling Denies chest pain or edema         No Known Allergies  Immunization History  Administered Date(s) Administered   PFIZER(Purple Top)SARS-COV-2 Vaccination 12/26/2019, 01/16/2020   PPD Test 11/16/2021    Past Medical History:  Diagnosis Date   Acute respiratory failure with hypoxia and hypercarbia (HCC)    AKI (acute kidney injury) (HCC)    Community acquired pneumonia    Demand ischemia    Diarrhea    Hypercapnic respiratory failure (HCC) 06/25/2020   Hypoglycemia    Hypokalemia    Morbid obesity (HCC)    Normocytic anemia    OSA treated with BiPAP    Pulmonary embolism (HCC)    Transaminitis     Tobacco History: Social History   Tobacco Use  Smoking Status Never  Smokeless Tobacco Never   Counseling given: Not Answered   Outpatient Encounter Medications as of 02/11/2022  Medication Sig   albuterol (PROVENTIL) (2.5 MG/3ML) 0.083% nebulizer solution Take 3 mLs (2.5 mg total) by nebulization every 4 (four) hours as needed for wheezing or shortness of breath.   albuterol (VENTOLIN HFA) 108 (90 Base) MCG/ACT inhaler Inhale 2 puffs into the lungs every 6 (six) hours as needed for wheezing or  shortness of breath.   diphenhydrAMINE-zinc acetate (BENADRYL) cream Apply topically 2 (two) times daily as needed for itching.   docusate sodium (COLACE) 100 MG capsule Take 1 capsule (100 mg total) by mouth 2 (two) times daily as needed for mild constipation.   Ensure Max Protein (ENSURE MAX PROTEIN) LIQD Take 330 mLs (11 oz total) by mouth 2 (two) times daily.   Misc. Devices (BARIATRIC ROLLATOR) MISC 1 each by Does not apply route daily.   NON FORMULARY CPAP Machine   Nutritional Supplements (,FEEDING SUPPLEMENT, PROSOURCE PLUS) liquid Take 30 mLs by mouth 2 (two) times daily between meals.   polyethylene glycol (MIRALAX / GLYCOLAX) 17 g packet Take 17 g by mouth daily as needed for moderate constipation.   [DISCONTINUED] furosemide (LASIX) 40 MG tablet Take 1 tablet (40 mg total) by mouth 2 (two) times daily.   furosemide (LASIX) 40 MG tablet Take 1 tablet (40 mg total) by mouth 2 (two) times daily.   potassium chloride SA (KLOR-CON M) 20 MEQ tablet Take 1 tablet (20 mEq total) by mouth 2 (two) times daily.   [DISCONTINUED] furosemide (LASIX) 40 MG tablet Take 1 tablet (40 mg total) by mouth 2 (two) times daily.   No facility-administered encounter medications on file as of 02/11/2022.     Review of Systems  Review of Systems  Constitutional: Negative.   HENT: Negative.    Cardiovascular: Negative.   Gastrointestinal: Negative.   Allergic/Immunologic: Negative.  Neurological: Negative.   Psychiatric/Behavioral: Negative.         Physical Exam  BP (!) 177/95   Pulse 87   Ht 5\' 2"  (1.575 m)   Wt (!) 372 lb (168.7 kg)   SpO2 94%   BMI 68.04 kg/m   Wt Readings from Last 5 Encounters:  02/11/22 (!) 372 lb (168.7 kg)  12/22/21 (!) 371 lb 3.2 oz (168.4 kg)  12/04/21 (!) 360 lb (163.3 kg)  11/25/21 (!) 343 lb 14.7 oz (156 kg)  09/17/20 (!) 391 lb (177.4 kg)     Physical Exam Vitals and nursing note reviewed.  Constitutional:      General: She is not in acute  distress.    Appearance: She is well-developed.  Cardiovascular:     Rate and Rhythm: Normal rate and regular rhythm.  Pulmonary:     Effort: Pulmonary effort is normal.     Breath sounds: Normal breath sounds.  Neurological:     Mental Status: She is alert and oriented to person, place, and time.      Lab Results:  CBC    Component Value Date/Time   WBC 6.2 12/04/2021 0938   WBC 7.8 11/27/2021 0506   RBC 5.45 (H) 12/04/2021 0938   RBC 4.78 11/27/2021 0506   HGB 12.5 12/04/2021 0938   HCT 43.2 12/04/2021 0938   PLT 226 12/04/2021 0938   MCV 79 12/04/2021 0938   MCH 22.9 (L) 12/04/2021 0938   MCH 22.8 (L) 11/27/2021 0506   MCHC 28.9 (L) 12/04/2021 0938   MCHC 27.9 (L) 11/27/2021 0506   RDW 22.1 (H) 12/04/2021 0938   LYMPHSABS 1.5 11/27/2021 0506   MONOABS 0.5 11/27/2021 0506   EOSABS 0.5 11/27/2021 0506   BASOSABS 0.0 11/27/2021 0506    BMET    Component Value Date/Time   NA 141 12/04/2021 0935   K 4.5 12/04/2021 0935   CL 100 12/04/2021 0935   CO2 24 12/04/2021 0935   GLUCOSE 49 (L) 12/04/2021 0935   GLUCOSE 101 (H) 11/27/2021 0506   BUN 17 12/04/2021 0935   CREATININE 0.95 12/04/2021 0935   CALCIUM 9.6 12/04/2021 0935   GFRNONAA >60 11/27/2021 0506    BNP    Component Value Date/Time   BNP 778.8 (H) 10/19/2021 1837    ProBNP No results found for: "PROBNP"  Imaging: DG Chest 2 View  Result Date: 01/22/2022 CLINICAL DATA:  Pulmonary embolism. EXAM: CHEST - 2 VIEW COMPARISON:  10/20/2021 FINDINGS: Low lung volumes are noted. Elevation of right hemidiaphragm is seen with mild right basilar atelectasis. No evidence of pulmonary consolidation or pleural effusion. Mild-to-moderate cardiomegaly is stable. IMPRESSION: Low lung volumes and mild right basilar atelectasis. Stable cardiomegaly. Electronically Signed   By: Marlaine Hind M.D.   On: 01/22/2022 08:18     Assessment & Plan:   Serum phosphate elevated - Phosphorus - CBC - Comprehensive metabolic  panel  2. Hypokalemia  - CBC - Comprehensive metabolic panel  3. Chronic diastolic CHF (congestive heart failure) (HCC)  - furosemide (LASIX) 40 MG tablet; Take 1 tablet (40 mg total) by mouth 2 (two) times daily.  Dispense: 60 tablet; Refill: 0   Follow up:  Follow up in 2 months     Fenton Foy, NP 02/11/2022

## 2022-02-11 NOTE — Patient Instructions (Signed)
1. Serum phosphate elevated  - Phosphorus - CBC - Comprehensive metabolic panel  2. Hypokalemia  - CBC - Comprehensive metabolic panel  3. Chronic diastolic CHF (congestive heart failure) (HCC)  - furosemide (LASIX) 40 MG tablet; Take 1 tablet (40 mg total) by mouth 2 (two) times daily.  Dispense: 60 tablet; Refill: 0   Follow up:  Follow up in 2 months

## 2022-02-11 NOTE — Assessment & Plan Note (Signed)
-   Phosphorus - CBC - Comprehensive metabolic panel  2. Hypokalemia  - CBC - Comprehensive metabolic panel  3. Chronic diastolic CHF (congestive heart failure) (HCC)  - furosemide (LASIX) 40 MG tablet; Take 1 tablet (40 mg total) by mouth 2 (two) times daily.  Dispense: 60 tablet; Refill: 0   Follow up:  Follow up in 2 months

## 2022-02-12 LAB — COMPREHENSIVE METABOLIC PANEL
ALT: 8 IU/L (ref 0–32)
AST: 18 IU/L (ref 0–40)
Albumin/Globulin Ratio: 1.4 (ref 1.2–2.2)
Albumin: 4.2 g/dL (ref 3.8–4.9)
Alkaline Phosphatase: 114 IU/L (ref 44–121)
BUN/Creatinine Ratio: 9 (ref 9–23)
BUN: 7 mg/dL (ref 6–24)
Bilirubin Total: 0.5 mg/dL (ref 0.0–1.2)
CO2: 30 mmol/L — ABNORMAL HIGH (ref 20–29)
Calcium: 9.2 mg/dL (ref 8.7–10.2)
Chloride: 102 mmol/L (ref 96–106)
Creatinine, Ser: 0.8 mg/dL (ref 0.57–1.00)
Globulin, Total: 2.9 g/dL (ref 1.5–4.5)
Glucose: 94 mg/dL (ref 70–99)
Potassium: 3.6 mmol/L (ref 3.5–5.2)
Sodium: 143 mmol/L (ref 134–144)
Total Protein: 7.1 g/dL (ref 6.0–8.5)
eGFR: 88 mL/min/{1.73_m2} (ref >59)

## 2022-02-12 LAB — CBC
Hematocrit: 42 % (ref 34.0–46.6)
Hemoglobin: 13 g/dL (ref 11.1–15.9)
MCH: 26.2 pg — ABNORMAL LOW (ref 26.6–33.0)
MCHC: 31 g/dL — ABNORMAL LOW (ref 31.5–35.7)
MCV: 85 fL (ref 79–97)
Platelets: 214 10*3/uL (ref 150–450)
RBC: 4.96 x10E6/uL (ref 3.77–5.28)
RDW: 13.7 % (ref 11.7–15.4)
WBC: 6.1 10*3/uL (ref 3.4–10.8)

## 2022-02-12 LAB — PHOSPHORUS: Phosphorus: 3.5 mg/dL (ref 3.0–4.3)

## 2022-02-18 ENCOUNTER — Telehealth: Payer: Self-pay | Admitting: Clinical

## 2022-02-18 NOTE — Telephone Encounter (Signed)
[facility] General Follow Up Note  02/18/2022 Name: Felicia Warren MRN: [id number] DOB: May 03, 1970 Felicia Warren is a 51 y.o. year old female who sees Paseda, Baird Kay, FNP for primary care. LCSW was initially consulted to assess patient's needs and assist the patient with housing resources and transportation barriers.  Interpreter: No.   Interpreter Name & Language: none  Assessment: Patient experiencing transportation barriers.  Ongoing Intervention: Today CSW called patient and completed [facility] [place] application with her over the phone. Patient had signed application when here in the office for last PCP visit. CSW submitted application to [employer].   CSW had referred patient to [facility] in October. [facility] advised CSW that patient was not eligible as she is not literally homeless; she is staying in a hotel.   Review of patient status, including review of consultants reports, relevant laboratory and other test results, and collaboration with appropriate care team members and the patient's provider was performed as part of comprehensive patient evaluation and provision of services.    Abigail Butts, LCSW [facility] [phone]

## 2022-03-31 ENCOUNTER — Telehealth: Payer: Self-pay

## 2022-03-31 NOTE — Telephone Encounter (Signed)
error 

## 2022-04-01 ENCOUNTER — Ambulatory Visit (INDEPENDENT_AMBULATORY_CARE_PROVIDER_SITE_OTHER): Payer: Medicaid Other | Admitting: Pulmonary Disease

## 2022-04-01 ENCOUNTER — Encounter: Payer: Self-pay | Admitting: Pulmonary Disease

## 2022-04-01 ENCOUNTER — Ambulatory Visit: Payer: Medicaid Other

## 2022-04-01 VITALS — BP 130/74 | HR 87 | Wt 388.0 lb

## 2022-04-01 DIAGNOSIS — I2609 Other pulmonary embolism with acute cor pulmonale: Secondary | ICD-10-CM

## 2022-04-01 NOTE — Progress Notes (Signed)
@Patient  ID: Felicia Warren, female    DOB: 1970-09-17, 52 y.o.   MRN: [id number]  Chief Complaint  Patient presents with   Follow-up    Pt is here for follow up for a PE. Pt did not get the VQ scan for issues that have already been addressed with MH.     Referring provider: Renee Rival, FNP  HPI:   52 y.o. woman whom we are seeing in follow-up for evaluation of pulmonary embolus and for evaluation of chronic hypercarbic respiratory failure.   Returns for routine follow-up.  VQ scan ordered at last visit to evaluate improving RV changes.  This cannot be accommodated at [facility].  Scheduled for [place] but she did not go.  She only has 1 day off.  Has limited transportation options as well.  We discussed the role and rationale for VQ scan.  She is off anticoagulation, chronic clot will certainly require ongoing anticoagulation.  HPI at initial visit: Patient was admitted after being found down in a motel room.  PCO2 elevated.  CT scan on admission demonstrated pulmonary embolus.  No clear provoking factor.  Largest risk factor is morbid obesity.  She was placed on BiPAP.  Gradually improved with BiPAP therapy and treatment of PE.  TTE revealed RV dysfunction.  Discharged with a BiPAP machine.  She continues to use this.  She was directed to the take blood thinners for at least 3 months.  She stopped this about a month ago, after only about 2 months of therapy.  She states it was too expensive.  States she did not have refills.  Appears refills were available.  She continues take Lasix.  She was noted to be volume overloaded in the hospital.  She stopped her potassium supplementation.  Today she feels okay.  A bit better than in the hospital.  Still some dyspnea on exertion.  Reviewed CT images 06/2020 That Demonstrated Low Lung Volumes, Scattered Streaky Infiltrates in the Bases Most Likely Atelectasis with Scattered Peribronchovascular Groundglass Opacities on My Interpretation.  Most  Recent Chest Imaging 07/05/2020 Is a Chest X-Ray Which Demonstrates Low Lung Volumes, Habitus Precludes Accurate Interpretation but Appears to Have Bilateral Hilar to Midlung Field Infiltrates Most Likely Consistent with Pulmonary Edema on My Interpretation.  PMH: Morbid obesity, volume overload, PE, OSA/OHS Surgical history: History reviewed. No pertinent surgical history. Family history: Family History  Problem Relation Age of Onset   Diabetes type II Mother    Thyroid disease Mother    Hypertension Mother    Diabetes type II Father    Diabetes Father    Hypertension Sister    Colon cancer Neg Hx    Breast cancer Neg Hx    Social history: Recently moved from [place], lives in [place], never smoker  Licensed conveyancer / Pulmonary Flowsheets:   ACT:      No data to display           MMRC:     No data to display           Epworth:      No data to display           Tests:   FENO:  No results found for: "NITRICOXIDE"  PFT:     No data to display           WALK:      No data to display           Imaging: Personally reviewed and as per EMR  Lab Results: Personally reviewed CBC    Component Value Date/Time   WBC 6.1 02/11/2022 1402   WBC 7.8 11/27/2021 0506   RBC 4.96 02/11/2022 1402   RBC 4.78 11/27/2021 0506   HGB 13.0 02/11/2022 1402   HCT 42.0 02/11/2022 1402   PLT 214 02/11/2022 1402   MCV 85 02/11/2022 1402   MCH 26.2 (L) 02/11/2022 1402   MCH 22.8 (L) 11/27/2021 0506   MCHC 31.0 (L) 02/11/2022 1402   MCHC 27.9 (L) 11/27/2021 0506   RDW 13.7 02/11/2022 1402   LYMPHSABS 1.5 11/27/2021 0506   MONOABS 0.5 11/27/2021 0506   EOSABS 0.5 11/27/2021 0506   BASOSABS 0.0 11/27/2021 0506    BMET    Component Value Date/Time   NA 143 02/11/2022 1402   K 3.6 02/11/2022 1402   CL 102 02/11/2022 1402   CO2 30 (H) 02/11/2022 1402   GLUCOSE 94 02/11/2022 1402   GLUCOSE 101 (H) 11/27/2021 0506   BUN 7 02/11/2022 1402   CREATININE  0.80 02/11/2022 1402   CALCIUM 9.2 02/11/2022 1402   GFRNONAA >60 11/27/2021 0506    BNP    Component Value Date/Time   BNP 778.8 (H) 10/19/2021 1837    ProBNP No results found for: "PROBNP"  Specialty Problems       Pulmonary Problems   Acute on chronic respiratory failure with hypoxia and hypercapnia (HCC)   Acute respiratory failure with hypoxia (HCC)   OSA/OHS on CPAP   Obesity hypoventilation syndrome and obstructive sleep apnea   Acute respiratory failure with hypercapnia ([place])    No Known Allergies  Immunization History  Administered Date(s) Administered   PFIZER(Purple Top)SARS-COV-2 Vaccination 12/26/2019, 01/16/2020   PPD Test 11/16/2021    Past Medical History:  Diagnosis Date   Acute respiratory failure with hypoxia and hypercarbia (HCC)    AKI (acute kidney injury) ([place])    Community acquired pneumonia    Demand ischemia    Diarrhea    Hypercapnic respiratory failure ([place]) 06/25/2020   Hypoglycemia    Hypokalemia    Morbid obesity (HCC)    Normocytic anemia    OSA treated with BiPAP    Pulmonary embolism (HCC)    Transaminitis     Tobacco History: Social History   Tobacco Use  Smoking Status Never  Smokeless Tobacco Never   Counseling given: Not Answered   Continue to not smoke  Outpatient Encounter Medications as of 04/01/2022  Medication Sig   albuterol (PROVENTIL) (2.5 MG/3ML) 0.083% nebulizer solution Take 3 mLs (2.5 mg total) by nebulization every 4 (four) hours as needed for wheezing or shortness of breath.   albuterol (VENTOLIN HFA) 108 (90 Base) MCG/ACT inhaler Inhale 2 puffs into the lungs every 6 (six) hours as needed for wheezing or shortness of breath.   diphenhydrAMINE-zinc acetate (BENADRYL) cream Apply topically 2 (two) times daily as needed for itching.   docusate sodium (COLACE) 100 MG capsule Take 1 capsule (100 mg total) by mouth 2 (two) times daily as needed for mild constipation.   Ensure Max Protein (ENSURE MAX  PROTEIN) LIQD Take 330 mLs (11 oz total) by mouth 2 (two) times daily.   furosemide (LASIX) 40 MG tablet Take 1 tablet (40 mg total) by mouth 2 (two) times daily.   Misc. Devices (BARIATRIC ROLLATOR) MISC 1 each by Does not apply route daily.   NON FORMULARY CPAP Machine   Nutritional Supplements (,FEEDING SUPPLEMENT, PROSOURCE PLUS) liquid Take 30 mLs by mouth 2 (two) times daily between  meals.   polyethylene glycol (MIRALAX / GLYCOLAX) 17 g packet Take 17 g by mouth daily as needed for moderate constipation.   potassium chloride SA (KLOR-CON M) 20 MEQ tablet Take 1 tablet (20 mEq total) by mouth 2 (two) times daily.   No facility-administered encounter medications on file as of 04/01/2022.     Review of Systems  Review of Systems  N/a Physical Exam  BP 130/74 (BP Location: Left Arm, Patient Position: Sitting, Cuff Size: Normal)   Pulse 87   Wt (!) 388 lb (176 kg)   SpO2 96%   BMI 70.97 kg/m   Wt Readings from Last 5 Encounters:  04/01/22 (!) 388 lb (176 kg)  02/11/22 (!) 372 lb (168.7 kg)  12/22/21 (!) 371 lb 3.2 oz (168.4 kg)  12/04/21 (!) 360 lb (163.3 kg)  11/25/21 (!) 343 lb 14.7 oz (156 kg)    BMI Readings from Last 5 Encounters:  04/01/22 70.97 kg/m  02/11/22 68.04 kg/m  12/22/21 67.89 kg/m  12/04/21 61.79 kg/m  11/25/21 64.98 kg/m     Physical Exam General: Obese, no acute distress Eyes: EOMI, no icterus Neck: Supple, no JVP appreciated, habitus makes it difficult to fully assess Cardiovascular: Regular in rhythm, no murmur Pulmonary: Distant, clear to auscultation bilaterally, no wheeze, normal work of breathing Abdomen: Nondistended, bowel sounds present Psych: Normal mood, full affect MSK: No synovitis, joint effusion   Assessment & Plan:   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  Felt to be combination of CHF exacerbation and possible  exacerbation of OHS.  She reports good adherence to wearing BiPAP at night every night now.   Continue BiPAP for now, is certainly providing significant clinical benefit.  Polysomnography ordered in the past was not completed, she did not do this.  Pulmonary embolus: 06/2020.  McConnell sign on echocardiogram.  At risk for chronic RV dysfunction in the setting of OHS/OSA.  Only completed 2 months of Xarelto due to cost.  TTE ordered at my evaluation 08/2020.  She did not follow-up with this.  Citing financial difficulties at the time.  Never followed up with me.  Subsequent TTE 05/2021 with RV dilation and normal estimated PASP, 09/2021 with normal RV function and normal estimated PASP.  Nuclear medicine perfusion scan with chest x-ray ordered to evaluate presents of chronic thromboembolic disease.  VQ scan ordered given persistence of RV dilation which could be chronic in nature.   Return in about 6 months (around 09/30/2022).   Lanier Clam, MD 04/01/2022

## 2022-04-01 NOTE — Patient Instructions (Addendum)
Nice to see you again  No changes to medications  I did order the perfusion scan to be performed at [facility].  I requested to be on the Thursday.  Make sure that they work with you to schedule it at your earliest convenience on the day that you are off and can receive transportation.  Depending on the results of the test we will go on timing of follow-up, assuming the test is normal, I will see you back in 6 months, if we need to see you sooner we will make other arrangements.

## 2022-04-07 ENCOUNTER — Ambulatory Visit: Payer: Self-pay | Admitting: Nurse Practitioner

## 2022-04-08 ENCOUNTER — Ambulatory Visit
Admission: RE | Admit: 2022-04-08 | Discharge: 2022-04-08 | Disposition: A | Discharge status: Home / Self Care | Payer: Medicaid Other | Source: Ambulatory Visit | Attending: Pulmonary Disease | Admitting: Pulmonary Disease

## 2022-04-08 DIAGNOSIS — I2609 Other pulmonary embolism with acute cor pulmonale: Secondary | ICD-10-CM | POA: Insufficient documentation

## 2022-04-08 MED ORDER — TECHNETIUM TO 99M ALBUMIN AGGREGATED
4.0000 | Freq: Once | INTRAVENOUS | Status: AC | PRN
  Administered 2022-04-08: 3.1 via INTRAVENOUS

## 2022-04-14 ENCOUNTER — Ambulatory Visit (INDEPENDENT_AMBULATORY_CARE_PROVIDER_SITE_OTHER): Payer: Medicaid Other | Admitting: Nurse Practitioner

## 2022-04-14 ENCOUNTER — Encounter: Payer: Self-pay | Admitting: Nurse Practitioner

## 2022-04-14 VITALS — BP 166/68 | HR 60 | Temp 97.5°F | Ht 64.0 in | Wt 382.8 lb

## 2022-04-14 DIAGNOSIS — J9621 Acute and chronic respiratory failure with hypoxia: Secondary | ICD-10-CM | POA: Diagnosis not present

## 2022-04-14 DIAGNOSIS — I5032 Chronic diastolic (congestive) heart failure: Secondary | ICD-10-CM

## 2022-04-14 DIAGNOSIS — I5033 Acute on chronic diastolic (congestive) heart failure: Secondary | ICD-10-CM

## 2022-04-14 DIAGNOSIS — E119 Type 2 diabetes mellitus without complications: Secondary | ICD-10-CM

## 2022-04-14 DIAGNOSIS — Z6841 Body Mass Index (BMI) 40.0 and over, adult: Secondary | ICD-10-CM

## 2022-04-14 DIAGNOSIS — J9622 Acute and chronic respiratory failure with hypercapnia: Secondary | ICD-10-CM

## 2022-04-14 MED ORDER — FUROSEMIDE 40 MG PO TABS: 40.0000 mg | ORAL_TABLET | Freq: Two times a day (BID) | ORAL | 0 refills | Status: DC

## 2022-04-14 MED ORDER — BLOOD PRESSURE MONITOR KIT: PACK | 0 refills | Status: AC

## 2022-04-14 MED ORDER — POTASSIUM CHLORIDE CRYS ER 20 MEQ PO TBCR: 20.0000 meq | EXTENDED_RELEASE_TABLET | Freq: Two times a day (BID) | ORAL | 0 refills | Status: DC

## 2022-04-14 NOTE — Assessment & Plan Note (Signed)
Wt Readings from Last 3 Encounters:  04/14/22 (!) 382 lb 12.8 oz (173.6 kg)  04/01/22 (!) 388 lb (176 kg)  02/11/22 (!) 372 lb (168.7 kg)  She plans to start going to the gym soon She was encouraged to be in gauge in regular moderate exercises at least 150 minutes weekly as tolerated. Patient counseled on low-carb modified diet

## 2022-04-14 NOTE — Assessment & Plan Note (Addendum)
BP Readings from Last 3 Encounters:  04/14/22 (!) 166/68  04/01/22 130/74  02/11/22 (!) 177/95  Blood pressure significantly elevated in the office today, she is run out of her medications Furosemide 40 mg twice daily refilled, take with potassium 20 mill EQ daily twice daily Continue to monitor blood pressure at home monitor weight DASH diet advised. Patient referred to cardiology

## 2022-04-14 NOTE — Patient Instructions (Addendum)
(  HEART FAILURE PATIENTS) Call MD:  Anytime you have any of the following symptoms: 1) 3 pound weight gain in 24 hours or 5 pounds in 1 week 2) shortness of breath, with or without a dry hacking cough 3) swelling in the hands, feet or stomach 4) if you have to sleep on extra pillows at night in order to breathe.   Complete by: As directed      Discharge instructions     Please get your TDAP vaccine, shingles vaccine at the pharmacy.     1. Chronic diastolic CHF (congestive heart failure) ([place])  - Ambulatory referral to Cardiology - furosemide (LASIX) 40 MG tablet; Take 1 tablet (40 mg total) by mouth 2 (two) times daily.  Dispense: 180 tablet; Refill: 0  2. Acute on chronic diastolic (congestive) heart failure ([place])   3. Acute on chronic respiratory failure with hypoxia and hypercapnia (HCC)  4. BMI 60.0-69.9, adult ([place])   5. Type 2 diabetes mellitus without complication, without long-term current use of insulin (HCC)  It is important that you exercise regularly at least 30 minutes 5 times a week as tolerated  Think about what you will eat, plan ahead. Choose " clean, green, fresh or frozen" over canned, processed or packaged foods which are more sugary, salty and fatty. 70 to 75% of food eaten should be vegetables and fruit. Three meals at set times with snacks allowed between meals, but they must be fruit or vegetables. Aim to eat over a 12 hour period , example 7 am to 7 pm, and STOP after  your last meal of the day. Drink water,generally about 64 ounces per day, no other drink is as healthy. Fruit juice is best enjoyed in a healthy way, by EATING the fruit.  Thanks for choosing [facility] we consider it a privelige to serve you.

## 2022-04-14 NOTE — Assessment & Plan Note (Addendum)
Lab Results  Component Value Date   HGBA1C 6.1 (H) 12/04/2021  Avoid sugar sweets soda, patient not sure if she actually had diabetes or prediabetes.  A1c results noted on her chart as always been less than 6.5

## 2022-04-14 NOTE — Progress Notes (Signed)
Established Patient Office Visit  Subjective:  Patient ID: Felicia Warren, female    DOB: 1970/06/30  Age: 52 y.o. MRN: [id number]  CC:  Chief Complaint  Patient presents with   Follow-up    HPI Felicia Warren is a 52 y.o. female with past medical history of chronic diastolic CHF, chronic respiratory failure with hypoxia and hypercapnia, obstructive sleep apnea on CPAP, type 2 diabetes, obesity who presents for follow-up for her chronic medical conditions.    Chronic CHF.  Currently on furosemide 40 mg twice daily stated that she ran out of the medication.  She denies shortness of breath, edema, chest pain, reported that she has been monitoring her weight at home but does not check her blood pressure.  I discussed need for referral to cardiology today as she had refused during her last appointment.  Furosemide 40 mg twice daily refilled referral to cardiology was placed today.  Stated that she has registered at the gym and trying to do some exercise to lose weight.  She is not ready for colon cancer screening, cervical cancer screening, mammogram will continue to educate patient on the need to get the screening test done.  She was encouraged to get her Tdap vaccine, shingles vaccine and flu vaccine need for all vaccines discussed.  She declined a flu vaccine in the office today.   She now has a temporary job she is excited about this hoping that she will be able to get a permanent position.    She has not been taking lasix, had echo . She has joined the gym  Past Medical History:  Diagnosis Date   Acute respiratory failure with hypoxia and hypercarbia (HCC)    AKI (acute kidney injury) ([place])    Community acquired pneumonia    Demand ischemia    Diarrhea    Hypercapnic respiratory failure ([place]) 06/25/2020   Hypoglycemia    Hypokalemia    Morbid obesity (HCC)    Normocytic anemia    OSA treated with BiPAP    Pulmonary embolism (HCC)    Transaminitis     No past surgical history  on file.  Family History  Problem Relation Age of Onset   Diabetes type II Mother    Thyroid disease Mother    Hypertension Mother    Diabetes type II Father    Diabetes Father    Hypertension Sister    Colon cancer Neg Hx    Breast cancer Neg Hx     Social History   Socioeconomic History   Marital status: Single    Spouse name: Not on file   Number of children: 0   Years of education: Not on file   Highest education level: Not on file  Occupational History   Not on file  Tobacco Use   Smoking status: Never   Smokeless tobacco: Never  Vaping Use   Vaping Use: Never used  Substance and Sexual Activity   Alcohol use: Not Currently   Drug use: Not Currently   Sexual activity: Not Currently    Birth control/protection: None  Other Topics Concern   Not on file  Social History Narrative   Living in a hotel currently    Social Determinants of Health   Financial Resource Strain: Not on file  Food Insecurity: Food Insecurity Present (10/29/2021)   Hunger Vital Sign    Worried About Running Out of Food in the Last Year: Sometimes true    Ran Out of Food in the Last Year:  Sometimes true  Transportation Needs: Unmet Transportation Needs (10/29/2021)   PRAPARE - Hydrologist (Medical): No    Lack of Transportation (Non-Medical): Yes  Physical Activity: Not on file  Stress: Not on file  Social Connections: Not on file  Intimate Partner Violence: Not At Risk (10/29/2021)   Humiliation, Afraid, Rape, and Kick questionnaire    Fear of Current or Ex-Partner: No    Emotionally Abused: No    Physically Abused: No    Sexually Abused: No    Outpatient Medications Prior to Visit  Medication Sig Dispense Refill   albuterol (PROVENTIL) (2.5 MG/3ML) 0.083% nebulizer solution Take 3 mLs (2.5 mg total) by nebulization every 4 (four) hours as needed for wheezing or shortness of breath. 90 mL 12   albuterol (VENTOLIN HFA) 108 (90 Base) MCG/ACT inhaler Inhale 2  puffs into the lungs every 6 (six) hours as needed for wheezing or shortness of breath. 8 g 1   diphenhydrAMINE-zinc acetate (BENADRYL) cream Apply topically 2 (two) times daily as needed for itching. 28.4 g 0   docusate sodium (COLACE) 100 MG capsule Take 1 capsule (100 mg total) by mouth 2 (two) times daily as needed for mild constipation. 10 capsule 0   Ensure Max Protein (ENSURE MAX PROTEIN) LIQD Take 330 mLs (11 oz total) by mouth 2 (two) times daily. 3330 mL 0   Misc. Devices (BARIATRIC ROLLATOR) MISC 1 each by Does not apply route daily. 1 each 0   NON FORMULARY CPAP Machine     polyethylene glycol (MIRALAX / GLYCOLAX) 17 g packet Take 17 g by mouth daily as needed for moderate constipation. 14 each 0   Nutritional Supplements (,FEEDING SUPPLEMENT, PROSOURCE PLUS) liquid Take 30 mLs by mouth 2 (two) times daily between meals. (Patient not taking: Reported on 04/14/2022) 887 mL 0   furosemide (LASIX) 40 MG tablet Take 1 tablet (40 mg total) by mouth 2 (two) times daily. (Patient not taking: Reported on 04/14/2022) 60 tablet 0   potassium chloride SA (KLOR-CON M) 20 MEQ tablet Take 1 tablet (20 mEq total) by mouth 2 (two) times daily. (Patient not taking: Reported on 04/14/2022) 60 tablet 1   No facility-administered medications prior to visit.    No Known Allergies  ROS Review of Systems  Constitutional: Negative.  Negative for activity change, appetite change, chills, diaphoresis, fatigue, fever and unexpected weight change.  Respiratory: Negative.  Negative for apnea, cough, choking, chest tightness, shortness of breath, wheezing and stridor.   Cardiovascular: Negative.  Negative for chest pain and leg swelling.  Gastrointestinal: Negative.   Musculoskeletal:  Negative for arthralgias, back pain, joint swelling and myalgias.  Neurological:  Negative for dizziness, facial asymmetry, light-headedness and headaches.  Psychiatric/Behavioral: Negative.        Objective:    Physical  Exam Constitutional:      General: She is not in acute distress.    Appearance: She is obese. She is not ill-appearing, toxic-appearing or diaphoretic.  Eyes:     General: No scleral icterus.       Right eye: No discharge.        Left eye: No discharge.     Extraocular Movements: Extraocular movements intact.  Cardiovascular:     Rate and Rhythm: Normal rate and regular rhythm.     Pulses: Normal pulses.     Heart sounds: Normal heart sounds. No murmur heard.    No friction rub. No gallop.  Pulmonary:  Effort: Pulmonary effort is normal. No respiratory distress.     Breath sounds: Normal breath sounds. No stridor. No wheezing, rhonchi or rales.  Chest:     Chest wall: No tenderness.  Abdominal:     General: There is no distension.     Palpations: Abdomen is soft.     Tenderness: There is no abdominal tenderness. There is no guarding.  Musculoskeletal:        General: No tenderness, deformity or signs of injury.     Right lower leg: No edema.     Left lower leg: No edema.  Skin:    General: Skin is warm and dry.     Capillary Refill: Capillary refill takes less than 2 seconds.  Neurological:     Mental Status: She is alert and oriented to person, place, and time.     Motor: No weakness.     Coordination: Coordination normal.     Gait: Gait normal.     Deep Tendon Reflexes: Reflexes normal.  Psychiatric:        Mood and Affect: Mood normal.        Behavior: Behavior normal.        Thought Content: Thought content normal.        Judgment: Judgment normal.     BP (!) 166/68   Pulse 60   Temp (!) 97.5 F (36.4 C)   Ht 5' 4"$  (1.626 m)   Wt (!) 382 lb 12.8 oz (173.6 kg)   SpO2 100%   BMI 65.71 kg/m  Wt Readings from Last 3 Encounters:  04/14/22 (!) 382 lb 12.8 oz (173.6 kg)  04/01/22 (!) 388 lb (176 kg)  02/11/22 (!) 372 lb (168.7 kg)    Lab Results  Component Value Date   TSH 3.270 12/04/2021   Lab Results  Component Value Date   WBC 6.1 02/11/2022   HGB  13.0 02/11/2022   HCT 42.0 02/11/2022   MCV 85 02/11/2022   PLT 214 02/11/2022   Lab Results  Component Value Date   NA 143 02/11/2022   K 3.6 02/11/2022   CO2 30 (H) 02/11/2022   GLUCOSE 94 02/11/2022   BUN 7 02/11/2022   CREATININE 0.80 02/11/2022   BILITOT 0.5 02/11/2022   ALKPHOS 114 02/11/2022   AST 18 02/11/2022   ALT 8 02/11/2022   PROT 7.1 02/11/2022   ALBUMIN 4.2 02/11/2022   CALCIUM 9.2 02/11/2022   ANIONGAP 8 11/27/2021   EGFR 88 02/11/2022   GFR 77.93 09/08/2020   Lab Results  Component Value Date   CHOL 208 (H) 12/04/2021   Lab Results  Component Value Date   HDL 75 12/04/2021   Lab Results  Component Value Date   LDLCALC 112 (H) 12/04/2021   Lab Results  Component Value Date   TRIG 119 12/04/2021   Lab Results  Component Value Date   CHOLHDL 2.8 12/04/2021   Lab Results  Component Value Date   HGBA1C 6.1 (H) 12/04/2021      Assessment & Plan:   Problem List Items Addressed This Visit       Cardiovascular and Mediastinum   Chronic diastolic CHF (congestive heart failure) ([place]) - Primary    BP Readings from Last 3 Encounters:  04/14/22 (!) 166/68  04/01/22 130/74  02/11/22 (!) 177/95  Blood pressure significantly elevated in the office today, she is run out of her medications Furosemide 40 mg twice daily refilled, take with potassium 20 mill EQ daily twice daily  Continue to monitor blood pressure at home monitor weight DASH diet advised. Patient referred to cardiology      Relevant Medications   furosemide (LASIX) 40 MG tablet   Blood Pressure Monitor KIT   Other Relevant Orders   Ambulatory referral to Cardiology   Acute on chronic diastolic (congestive) heart failure (HCC)    Currently stable denies shortness of breath, edema Continue furosemide and potassium Follow-up with cardiology      Relevant Medications   furosemide (LASIX) 40 MG tablet     Respiratory   Acute on chronic respiratory failure with hypoxia and  hypercapnia (HCC)    She currently denies shortness of breath, wheezing She was recently evaluated by pulmonology Nuclear medicine perfusion test demonstrated no PE Continue bipap at home and maintain close follow-up with pulmonology        Endocrine   Type 2 diabetes mellitus without complication ([place])    Lab Results  Component Value Date   HGBA1C 6.1 (H) 12/04/2021  Avoid sugar sweets soda, patient not sure if she actually had diabetes or prediabetes.  A1c results noted on her chart as always been less than 6.5        Other   BMI 60.0-69.9, adult ([place])    Wt Readings from Last 3 Encounters:  04/14/22 (!) 382 lb 12.8 oz (173.6 kg)  04/01/22 (!) 388 lb (176 kg)  02/11/22 (!) 372 lb (168.7 kg)  She plans to start going to the gym soon She was encouraged to be in gauge in regular moderate exercises at least 150 minutes weekly as tolerated. Patient counseled on low-carb modified diet       Meds ordered this encounter  Medications   furosemide (LASIX) 40 MG tablet    Sig: Take 1 tablet (40 mg total) by mouth 2 (two) times daily.    Dispense:  180 tablet    Refill:  0   potassium chloride SA (KLOR-CON M) 20 MEQ tablet    Sig: Take 1 tablet (20 mEq total) by mouth 2 (two) times daily.    Dispense:  180 tablet    Refill:  0   Blood Pressure Monitor KIT    Sig: Check blood pressure daily.    Dispense:  1 kit    Refill:  0    Follow-up: Return in about 4 months (around 08/13/2022) for CHF / HLD .    Renee Rival, FNP

## 2022-04-14 NOTE — Assessment & Plan Note (Signed)
She currently denies shortness of breath, wheezing She was recently evaluated by pulmonology Nuclear medicine perfusion test demonstrated no PE Continue bipap at home and maintain close follow-up with pulmonology

## 2022-04-14 NOTE — Assessment & Plan Note (Signed)
Currently stable denies shortness of breath, edema Continue furosemide and potassium Follow-up with cardiology

## 2022-05-27 ENCOUNTER — Encounter: Payer: Self-pay | Admitting: Interventional Cardiology

## 2022-05-27 ENCOUNTER — Ambulatory Visit: Payer: Medicaid Other | Attending: Interventional Cardiology | Admitting: Interventional Cardiology

## 2022-05-27 VITALS — BP 162/98 | HR 80 | Ht 62.0 in | Wt 376.0 lb

## 2022-05-27 DIAGNOSIS — G4733 Obstructive sleep apnea (adult) (pediatric): Secondary | ICD-10-CM

## 2022-05-27 DIAGNOSIS — I5032 Chronic diastolic (congestive) heart failure: Secondary | ICD-10-CM

## 2022-05-27 DIAGNOSIS — R030 Elevated blood-pressure reading, without diagnosis of hypertension: Secondary | ICD-10-CM

## 2022-05-27 MED ORDER — POTASSIUM CHLORIDE CRYS ER 20 MEQ PO TBCR: 20.0000 meq | EXTENDED_RELEASE_TABLET | Freq: Two times a day (BID) | ORAL | 1 refills | Status: DC | PRN

## 2022-05-27 MED ORDER — FUROSEMIDE 40 MG PO TABS: 40.0000 mg | ORAL_TABLET | Freq: Two times a day (BID) | ORAL | 1 refills | Status: DC | PRN

## 2022-05-27 MED ORDER — POTASSIUM CHLORIDE CRYS ER 20 MEQ PO TBCR: 20.0000 meq | EXTENDED_RELEASE_TABLET | Freq: Two times a day (BID) | ORAL | 1 refills | Status: AC | PRN

## 2022-05-27 NOTE — Progress Notes (Signed)
Cardiology Office Note   Date:  05/27/2022   ID:  Felicia Warren, DOB 07/08/1970, MRN [id number]  PCP:  Renee Rival, FNP    No chief complaint on file.    Wt Readings from Last 3 Encounters:  05/27/22 (!) 376 lb (170.6 kg)  04/14/22 (!) 382 lb 12.8 oz (173.6 kg)  04/01/22 (!) 388 lb (176 kg)       History of Present Illness: Felicia Warren is a 52 y.o. female who is being seen today for the evaluation of chronic diastolic heart failure at the request of Renee Rival, FNP.   Echo 2023 showed: "Left ventricular ejection fraction, by estimation, is 60 to 65%. The  left ventricle has normal function. The left ventricle has no regional  wall motion abnormalities. There is moderate concentric left ventricular  hypertrophy. Left ventricular  diastolic parameters are consistent with Grade II diastolic dysfunction  (pseudonormalization).   2. Right ventricular systolic function is low normal. The right  ventricular size is mildly enlarged. There is normal pulmonary artery  systolic pressure.   3. The mitral valve is normal in structure. No evidence of mitral valve  regurgitation. No evidence of mitral stenosis. The mean mitral valve  gradient is 3.0 mmHg with average heart rate of 74 bpm.   4. The aortic valve was not well visualized. Aortic valve regurgitation  is not visualized. No aortic stenosis is present.   Comparison(s): No significant change from prior study. "  Hospitalized in 11/2021:  "morbid obesity, OHS/OSA, COPD, chronic diastolic congestive heart failure, type 2 diabetes mellitus, who presented to [facility] from a [place] on 10/19/2021 with altered mental status and adult failure to thrive.  Upon presentation, patient was noted to be disoriented, acidotic with a PCO2 of 80.  Lab work notable for acute renal failure and she was initially admitted under the PCCM service and managed for COPD/CHF exacerbation.  She was initially placed on BiPAP in which she failed  and was subsequently intubated and placed on ventilatory support on 10/20/2021.  Patient was successfully extubated on 10/22/2021 and transferred out of the ICU on 10/24/2021 under the hospitalist service.  Social work following, per APS/DSS judge ruled on 11/18/2021 declining petition for guardianship with ruling that patient has ability to make her own decisions. "  Took blood thinner (Xarelto) for 3 months at the end of 2023 for PE.  Follow-up VQ scan in February 2024 was negative for PE.  Prior records show that she has a history of: "chronic diastolic CHF, chronic respiratory failure with hypoxia and hypercapnia, obstructive sleep apnea on CPAP, type 2 diabetes, obesity....... Chronic CHF.  Currently on furosemide 40 mg twice daily stated that she ran out of the medication.  She denies shortness of breath, edema, chest pain, reported that she has been monitoring her weight at home but does not check her blood pressure.  I discussed need for referral to cardiology today as she had refused during her last appointment.  Furosemide 40 mg twice daily refilled referral to cardiology was placed today.  Stated that she has registered at the gym and trying to do some exercise to lose weight."  Ran out of Lasix but has not been taking this recently.    PCP is supposed to get her a BP machine.   Reports stable breathing.  Denies : Chest pain. Dizziness. Leg edema. Nitroglycerin use. Orthopnea. Palpitations. Paroxysmal nocturnal dyspnea. Shortness of breath. Syncope.    Feels that swelling has resolved compared  to when in hospital.    Past Medical History:  Diagnosis Date   Acute respiratory failure with hypoxia and hypercarbia    AKI (acute kidney injury)    Community acquired pneumonia    Demand ischemia    Diarrhea    Hypercapnic respiratory failure 06/25/2020   Hypoglycemia    Hypokalemia    Morbid obesity    Normocytic anemia    OSA treated with BiPAP    Pulmonary embolism    Transaminitis      History reviewed. No pertinent surgical history.   Current Outpatient Medications  Medication Sig Dispense Refill   albuterol (PROVENTIL) (2.5 MG/3ML) 0.083% nebulizer solution Take 3 mLs (2.5 mg total) by nebulization every 4 (four) hours as needed for wheezing or shortness of breath. 90 mL 12   albuterol (VENTOLIN HFA) 108 (90 Base) MCG/ACT inhaler Inhale 2 puffs into the lungs every 6 (six) hours as needed for wheezing or shortness of breath. 8 g 1   Blood Pressure Monitor KIT Check blood pressure daily. 1 kit 0   diphenhydrAMINE-zinc acetate (BENADRYL) cream Apply topically 2 (two) times daily as needed for itching. 28.4 g 0   docusate sodium (COLACE) 100 MG capsule Take 1 capsule (100 mg total) by mouth 2 (two) times daily as needed for mild constipation. 10 capsule 0   Ensure Max Protein (ENSURE MAX PROTEIN) LIQD Take 330 mLs (11 oz total) by mouth 2 (two) times daily. 3330 mL 0   furosemide (LASIX) 40 MG tablet Take 1 tablet (40 mg total) by mouth 2 (two) times daily as needed. 180 tablet 1   Misc. Devices (BARIATRIC ROLLATOR) MISC 1 each by Does not apply route daily. 1 each 0   NON FORMULARY CPAP Machine     Nutritional Supplements (,FEEDING SUPPLEMENT, PROSOURCE PLUS) liquid Take 30 mLs by mouth 2 (two) times daily between meals. 887 mL 0   polyethylene glycol (MIRALAX / GLYCOLAX) 17 g packet Take 17 g by mouth daily as needed for moderate constipation. 14 each 0   potassium chloride SA (KLOR-CON M) 20 MEQ tablet Take 1 tablet (20 mEq total) by mouth 2 (two) times daily as needed. Take with furosemide 180 tablet 1   No current facility-administered medications for this visit.    Allergies:   Patient has no known allergies.    Social History:  The patient  reports that she has never smoked. She has never used smokeless tobacco. She reports that she does not currently use alcohol. She reports that she does not currently use drugs.   Family History:  The patient's family  history includes Diabetes in her father; Diabetes type II in her father and mother; Hypertension in her mother and sister; Thyroid disease in her mother.    ROS:  Please see the history of present illness.   Otherwise, review of systems are positive for difficulty with medicines- running out.   All other systems are reviewed and negative.    PHYSICAL EXAM: VS:  BP (!) 162/98   Pulse 80   Ht 5\' 2"  (1.575 m)   Wt (!) 376 lb (170.6 kg)   SpO2 99%   BMI 68.77 kg/m  , BMI Body mass index is 68.77 kg/m. GEN: Well nourished, well developed, in no acute distress HEENT: normal Neck: no JVD, carotid bruits, or masses Cardiac: RRR; no murmurs, rubs, or gallops,no edema  Respiratory:  clear to auscultation bilaterally, normal work of breathing GI: soft, nontender, nondistended, + BS, obese MS: no  deformity or atrophy, legs with significant obesity Skin: warm and dry, no rash Neuro:  Strength and sensation are intact Psych: euthymic mood, full affect   EKG:   The ekg ordered today demonstrates NSR, poor R wave progression, nonspecific ST changes   Recent Labs: 10/19/2021: B Natriuretic Peptide 778.8 12/04/2021: Magnesium 2.0; TSH 3.270 02/11/2022: ALT 8; BUN 7; Creatinine, Ser 0.80; Hemoglobin 13.0; Platelets 214; Potassium 3.6; Sodium 143   Lipid Panel    Component Value Date/Time   CHOL 208 (H) 12/04/2021 0935   TRIG 119 12/04/2021 0935   HDL 75 12/04/2021 0935   CHOLHDL 2.8 12/04/2021 0935   LDLCALC 112 (H) 12/04/2021 0935     Other studies Reviewed: Additional studies/ records that were reviewed today with results demonstrating: labs reviewed.   ASSESSMENT AND PLAN:  Chronic diastolic heart failure: Minimizing salt in the diet.  Prescribe the Lasix 40 mg tabs BID as needed.  Elevated BNP in August 2023.  Can take potassium 20 mEq when she takes a Lasix tablet.  I think she will need at least 40 mg a day 4-5 days/week and potentially more depending on her fluid  status. Elevated blood pressure reading today.Follow-up with PCP re HTN.  I explained that controlling blood pressure will also be helpful for minimizing diastolic heart failure.  She is hoping to avoid medication for this.  We talked about healthy lifestyle that would help keep blood pressure low. Morbid obesity: Trying to walk more.   Obstructive sleep apnea: Using CPAP.  Motivated to use her CPAP to stay out of hospital.   Decrease The intake of sugary drinks and processed sugar in general.  Eat a whole food,  Plant-based diet, high fiber diet.     Current medicines are reviewed at length with the patient today.  The patient concerns regarding her medicines were addressed.  The following changes have been made:  Lasix as noted above  Labs/ tests ordered today include:   Orders Placed This Encounter  Procedures   EKG 12-Lead    Recommend 150 minutes/week of aerobic exercise Low fat, low carb, high fiber diet recommended  Disposition:   FU in 6 months with APP. Needs fluid status and BP monitored.    Signed, Larae Grooms, MD  05/27/2022 12:06 PM    [facility] [place], [place], [place]  [address] Phone: [phone]; Fax: [phone]

## 2022-05-27 NOTE — Patient Instructions (Signed)
Medication Instructions:  Your physician has recommended you make the following change in your medication: Change furosemide to 40 mg twice daily as needed. Change potassium to 20 meq twice daily as needed.  Take 20 meq potassium each time you take furosemide 40 mg  *If you need a refill on your cardiac medications before your next appointment, please call your pharmacy*   Lab Work: none If you have labs (blood work) drawn today and your tests are completely normal, you will receive your results only by: [place] (if you have MyChart) OR A paper copy in the mail If you have any lab test that is abnormal or we need to change your treatment, we will call you to review the results.   Testing/Procedures: none   Follow-Up: At [facility], you and your health needs are our priority.  As part of our continuing mission to provide you with exceptional heart care, we have created designated Provider Care Teams.  These Care Teams include your primary Cardiologist (physician) and Advanced Practice Providers (APPs -  Physician Assistants and Nurse Practitioners) who all work together to provide you with the care you need, when you need it.  We recommend signing up for the patient portal called "MyChart".  Sign up information is provided on this After Visit Summary.  MyChart is used to connect with patients for Virtual Visits (Telemedicine).  Patients are able to view lab/test results, encounter notes, upcoming appointments, etc.  Non-urgent messages can be sent to your provider as well.   To learn more about what you can do with MyChart, go to [email or web address].    Your next appointment:   6 month(s)  Provider:   Nicholes Rough, PA-C, Melina Copa, PA-C, Ambrose Pancoast, NP, Ermalinda Barrios, PA-C, Christen Bame, NP, or Richardson Dopp, PA-C         Other Instructions

## 2022-08-11 ENCOUNTER — Ambulatory Visit: Payer: Self-pay | Admitting: Nurse Practitioner

## 2022-08-12 ENCOUNTER — Ambulatory Visit (INDEPENDENT_AMBULATORY_CARE_PROVIDER_SITE_OTHER): Payer: Medicaid Other | Admitting: Nurse Practitioner

## 2022-08-12 ENCOUNTER — Encounter: Payer: Self-pay | Admitting: Nurse Practitioner

## 2022-08-12 VITALS — BP 148/77 | HR 71 | Temp 97.0°F | Wt 389.9 lb

## 2022-08-12 DIAGNOSIS — R7303 Prediabetes: Secondary | ICD-10-CM | POA: Diagnosis not present

## 2022-08-12 DIAGNOSIS — Z1211 Encounter for screening for malignant neoplasm of colon: Secondary | ICD-10-CM | POA: Diagnosis not present

## 2022-08-12 LAB — POCT GLYCOSYLATED HEMOGLOBIN (HGB A1C): Hemoglobin A1C: 5.7 % — AB (ref 4.0–5.6)

## 2022-08-12 MED ORDER — FUROSEMIDE 40 MG PO TABS: 40.0000 mg | ORAL_TABLET | Freq: Two times a day (BID) | ORAL | 1 refills | Status: AC | PRN

## 2022-08-12 NOTE — Patient Instructions (Addendum)
1. Colon cancer screening  - Cologuard  2. Prediabetes  - Microalbumin/Creatinine Ratio, Urine - POCT glycosylated hemoglobin (Hb A1C)  Follow up:  Follow up in 4 months

## 2022-08-12 NOTE — Assessment & Plan Note (Signed)
-   Cologuard  2. Prediabetes  - Microalbumin/Creatinine Ratio, Urine - POCT glycosylated hemoglobin (Hb A1C)  Follow up:  Follow up in 4 months

## 2022-08-12 NOTE — Progress Notes (Signed)
@Patient  ID: Felicia Warren, female    DOB: September 29, 1970, 52 y.o.   MRN: [id number]  Chief Complaint  Patient presents with   Diabetes    Follow up along with CHF and HLD    Referring provider: Donell Beers, FNP   HPI  Felicia Warren is a 52 y.o. female with past medical history of chronic diastolic CHF, chronic respiratory failure with hypoxia and hypercapnia, obstructive sleep apnea on CPAP, prediabetes, obesity who presents for follow-up for her chronic medical conditions.      Chronic CHF.  Currently on furosemide 40 mg twice daily as needed. May take potassium as needed when on lasix. She denies shortness of breath, edema, chest pain, reported that she has been monitoring her weight at home but does not check her blood pressure.  Patient has followed with cardiology since her last visit here with [facility].  AIC today is 5.7.   Blood pressure was borderline in office today. Will continue to monitor.    She was encouraged to get her Tdap vaccine, shingles vaccine and flu vaccine need for all vaccines discussed.  She declined a flu vaccine in the office today.     No Known Allergies  Immunization History  Administered Date(s) Administered   PFIZER(Purple Top)SARS-COV-2 Vaccination 12/26/2019, 01/16/2020   PPD Test 11/16/2021    Past Medical History:  Diagnosis Date   Acute respiratory failure with hypoxia and hypercarbia (HCC)    AKI (acute kidney injury) (HCC)    Community acquired pneumonia    Demand ischemia    Diarrhea    Hypercapnic respiratory failure (HCC) 06/25/2020   Hypoglycemia    Hypokalemia    Morbid obesity (HCC)    Normocytic anemia    OSA treated with BiPAP    Pulmonary embolism (HCC)    Transaminitis     Tobacco History: Social History   Tobacco Use  Smoking Status Never  Smokeless Tobacco Never   Counseling given: Not Answered   Outpatient Encounter Medications as of 08/12/2022  Medication Sig   albuterol (PROVENTIL) (2.5 MG/3ML) 0.083%  nebulizer solution Take 3 mLs (2.5 mg total) by nebulization every 4 (four) hours as needed for wheezing or shortness of breath.   albuterol (VENTOLIN HFA) 108 (90 Base) MCG/ACT inhaler Inhale 2 puffs into the lungs every 6 (six) hours as needed for wheezing or shortness of breath.   Blood Pressure Monitor KIT Check blood pressure daily.   diphenhydrAMINE-zinc acetate (BENADRYL) cream Apply topically 2 (two) times daily as needed for itching.   docusate sodium (COLACE) 100 MG capsule Take 1 capsule (100 mg total) by mouth 2 (two) times daily as needed for mild constipation.   Ensure Max Protein (ENSURE MAX PROTEIN) LIQD Take 330 mLs (11 oz total) by mouth 2 (two) times daily.   Misc. Devices (BARIATRIC ROLLATOR) MISC 1 each by Does not apply route daily.   NON FORMULARY CPAP Machine   polyethylene glycol (MIRALAX / GLYCOLAX) 17 g packet Take 17 g by mouth daily as needed for moderate constipation.   furosemide (LASIX) 40 MG tablet Take 1 tablet (40 mg total) by mouth 2 (two) times daily as needed.   Nutritional Supplements (,FEEDING SUPPLEMENT, PROSOURCE PLUS) liquid Take 30 mLs by mouth 2 (two) times daily between meals. (Patient not taking: Reported on 08/12/2022)   potassium chloride SA (KLOR-CON M) 20 MEQ tablet Take 1 tablet (20 mEq total) by mouth 2 (two) times daily as needed. Take with furosemide (Patient not taking: Reported on 08/12/2022)   [  DISCONTINUED] furosemide (LASIX) 40 MG tablet Take 1 tablet (40 mg total) by mouth 2 (two) times daily as needed. (Patient not taking: Reported on 08/12/2022)   No facility-administered encounter medications on file as of 08/12/2022.     Review of Systems  Review of Systems  Constitutional: Negative.   HENT: Negative.    Cardiovascular: Negative.   Gastrointestinal: Negative.   Allergic/Immunologic: Negative.   Neurological: Negative.   Psychiatric/Behavioral: Negative.         Physical Exam  BP (!) 148/77   Pulse 71   Temp (!) 97 F  (36.1 C)   Wt (!) 389 lb 14.4 oz (176.9 kg)   SpO2 98%   BMI 71.31 kg/m   Wt Readings from Last 5 Encounters:  08/12/22 (!) 389 lb 14.4 oz (176.9 kg)  05/27/22 (!) 376 lb (170.6 kg)  04/14/22 (!) 382 lb 12.8 oz (173.6 kg)  04/01/22 (!) 388 lb (176 kg)  02/11/22 (!) 372 lb (168.7 kg)     Physical Exam Vitals and nursing note reviewed.  Constitutional:      General: She is not in acute distress.    Appearance: She is well-developed.  Cardiovascular:     Rate and Rhythm: Normal rate and regular rhythm.  Pulmonary:     Effort: Pulmonary effort is normal.     Breath sounds: Normal breath sounds.  Neurological:     Mental Status: She is alert and oriented to person, place, and time.      Lab Results:  CBC    Component Value Date/Time   WBC 6.1 02/11/2022 1402   WBC 7.8 11/27/2021 0506   RBC 4.96 02/11/2022 1402   RBC 4.78 11/27/2021 0506   HGB 13.0 02/11/2022 1402   HCT 42.0 02/11/2022 1402   PLT 214 02/11/2022 1402   MCV 85 02/11/2022 1402   MCH 26.2 (L) 02/11/2022 1402   MCH 22.8 (L) 11/27/2021 0506   MCHC 31.0 (L) 02/11/2022 1402   MCHC 27.9 (L) 11/27/2021 0506   RDW 13.7 02/11/2022 1402   LYMPHSABS 1.5 11/27/2021 0506   MONOABS 0.5 11/27/2021 0506   EOSABS 0.5 11/27/2021 0506   BASOSABS 0.0 11/27/2021 0506    BMET    Component Value Date/Time   NA 143 02/11/2022 1402   K 3.6 02/11/2022 1402   CL 102 02/11/2022 1402   CO2 30 (H) 02/11/2022 1402   GLUCOSE 94 02/11/2022 1402   GLUCOSE 101 (H) 11/27/2021 0506   BUN 7 02/11/2022 1402   CREATININE 0.80 02/11/2022 1402   CALCIUM 9.2 02/11/2022 1402   GFRNONAA >60 11/27/2021 0506    BNP    Component Value Date/Time   BNP 778.8 (H) 10/19/2021 1837     Assessment & Plan:   Colon cancer screening - Cologuard  2. Prediabetes  - Microalbumin/Creatinine Ratio, Urine - POCT glycosylated hemoglobin (Hb A1C)  Follow up:  Follow up in 4 months     Ivonne Andrew, NP 08/12/2022

## 2022-08-13 LAB — COMPREHENSIVE METABOLIC PANEL
ALT: 11 IU/L (ref 0–32)
AST: 20 IU/L (ref 0–40)
Albumin: 4.2 g/dL (ref 3.8–4.9)
Alkaline Phosphatase: 149 IU/L — ABNORMAL HIGH (ref 44–121)
BUN/Creatinine Ratio: 11 (ref 9–23)
BUN: 10 mg/dL (ref 6–24)
Bilirubin Total: 0.4 mg/dL (ref 0.0–1.2)
CO2: 29 mmol/L (ref 20–29)
Calcium: 9.2 mg/dL (ref 8.7–10.2)
Chloride: 102 mmol/L (ref 96–106)
Creatinine, Ser: 0.89 mg/dL (ref 0.57–1.00)
Globulin, Total: 2.9 g/dL (ref 1.5–4.5)
Glucose: 90 mg/dL (ref 70–99)
Potassium: 3.6 mmol/L (ref 3.5–5.2)
Sodium: 144 mmol/L (ref 134–144)
Total Protein: 7.1 g/dL (ref 6.0–8.5)
eGFR: 77 mL/min/{1.73_m2} (ref >59)

## 2022-08-13 LAB — CBC
Hematocrit: 42.7 % (ref 34.0–46.6)
Hemoglobin: 13.3 g/dL (ref 11.1–15.9)
MCH: 25.9 pg — ABNORMAL LOW (ref 26.6–33.0)
MCHC: 31.1 g/dL — ABNORMAL LOW (ref 31.5–35.7)
MCV: 83 fL (ref 79–97)
Platelets: 191 10*3/uL (ref 150–450)
RBC: 5.13 x10E6/uL (ref 3.77–5.28)
RDW: 14.1 % (ref 11.7–15.4)
WBC: 5.7 10*3/uL (ref 3.4–10.8)

## 2022-08-13 LAB — MICROALBUMIN / CREATININE URINE RATIO
Creatinine, Urine: 160 mg/dL
Microalb/Creat Ratio: 7 mg/g creat (ref 0–29)
Microalbumin, Urine: 11.1 ug/mL

## 2022-12-02 ENCOUNTER — Encounter: Payer: Self-pay | Admitting: Pulmonary Disease

## 2022-12-02 ENCOUNTER — Ambulatory Visit: Payer: Medicaid Other | Admitting: Pulmonary Disease

## 2022-12-02 DIAGNOSIS — J9602 Acute respiratory failure with hypercapnia: Secondary | ICD-10-CM

## 2022-12-02 MED ORDER — ALBUTEROL SULFATE HFA 108 (90 BASE) MCG/ACT IN AERS
2.0000 | INHALATION_SPRAY | Freq: Four times a day (QID) | RESPIRATORY_TRACT | 2 refills | Status: AC | PRN
Start: 2022-12-02 — End: ?

## 2022-12-02 NOTE — Patient Instructions (Addendum)
No changes to medication  I refilled your albuterol inhaler  I am glad you are doing well  Return to clinic in 1 year or sooner as needed with Dr. Judeth Horn

## 2022-12-02 NOTE — Progress Notes (Signed)
@Patient  ID: Felicia Warren, female    DOB: [birth year], 52 y.o.   MRN: [id number]  Chief Complaint  Patient presents with   Follow-up    Doing well.    Referring provider: Donell Beers, FNP  HPI:   52 y.o. woman whom we are seeing in follow-up for evaluation of pulmonary embolus and for evaluation of chronic hypercarbic respiratory failure.  Most recent cardiology note reviewed.  Most recent PCP note reviewed x 2.  Returns for routine follow-up.  VQ scan obtained in interim is okay clear.  Reassuring.  Breathing feels well.  No needs.  Has albuterol does not use.  Not using any other inhalers.  Feels like breathing is doing well.  HPI at initial visit: Patient was admitted after being found down in a motel room.  PCO2 elevated.  CT scan on admission demonstrated pulmonary embolus.  No clear provoking factor.  Largest risk factor is morbid obesity.  She was placed on BiPAP.  Gradually improved with BiPAP therapy and treatment of PE.  TTE revealed RV dysfunction.  Discharged with a BiPAP machine.  She continues to use this.  She was directed to the take blood thinners for at least 3 months.  She stopped this about a month ago, after only about 2 months of therapy.  She states it was too expensive.  States she did not have refills.  Appears refills were available.  She continues take Lasix.  She was noted to be volume overloaded in the hospital.  She stopped her potassium supplementation.  Today she feels okay.  A bit better than in the hospital.  Still some dyspnea on exertion.  Reviewed CT images 06/2020 That Demonstrated Low Lung Volumes, Scattered Streaky Infiltrates in the Bases Most Likely Atelectasis with Scattered Peribronchovascular Groundglass Opacities on My Interpretation.  Most Recent Chest Imaging 07/05/2020 Is a Chest X-Ray Which Demonstrates Low Lung Volumes, Habitus Precludes Accurate Interpretation but Appears to Have Bilateral Hilar to Midlung Field Infiltrates Most Likely  Consistent with Pulmonary Edema on My Interpretation.  PMH: Morbid obesity, volume overload, PE, OSA/OHS Surgical history: History reviewed. No pertinent surgical history. Family history: Family History  Problem Relation Age of Onset   Diabetes type II Mother    Thyroid disease Mother    Hypertension Mother    Diabetes type II Father    Diabetes Father    Hypertension Sister    Colon cancer Neg Hx    Breast cancer Neg Hx    Social history: Recently moved from [place], lives in [place], never smoker  Public house manager / Pulmonary Flowsheets:   ACT:      No data to display          MMRC:     No data to display          Epworth:      No data to display          Tests:   FENO:  No results found for: "NITRICOXIDE"  PFT:     No data to display          WALK:      No data to display          Imaging: Personally reviewed and as per EMR  Lab Results: Personally reviewed CBC    Component Value Date/Time   WBC 5.7 08/12/2022 0939   WBC 7.8 11/27/2021 0506   RBC 5.13 08/12/2022 0939   RBC 4.78 11/27/2021 0506   HGB 13.3 08/12/2022 0939  HCT 42.7 08/12/2022 0939   PLT 191 08/12/2022 0939   MCV 83 08/12/2022 0939   MCH 25.9 (L) 08/12/2022 0939   MCH 22.8 (L) 11/27/2021 0506   MCHC 31.1 (L) 08/12/2022 0939   MCHC 27.9 (L) 11/27/2021 0506   RDW 14.1 08/12/2022 0939   LYMPHSABS 1.5 11/27/2021 0506   MONOABS 0.5 11/27/2021 0506   EOSABS 0.5 11/27/2021 0506   BASOSABS 0.0 11/27/2021 0506    BMET    Component Value Date/Time   NA 144 08/12/2022 0939   K 3.6 08/12/2022 0939   CL 102 08/12/2022 0939   CO2 29 08/12/2022 0939   GLUCOSE 90 08/12/2022 0939   GLUCOSE 101 (H) 11/27/2021 0506   BUN 10 08/12/2022 0939   CREATININE 0.89 08/12/2022 0939   CALCIUM 9.2 08/12/2022 0939   GFRNONAA >60 11/27/2021 0506    BNP    Component Value Date/Time   BNP 778.8 (H) 10/19/2021 1837    ProBNP No results found for: "PROBNP"  Specialty  Problems       Pulmonary Problems   Acute on chronic respiratory failure with hypoxia and hypercapnia (HCC)   Acute respiratory failure with hypoxia (HCC)   OSA/OHS on CPAP   Obesity hypoventilation syndrome and obstructive sleep apnea   Acute respiratory failure with hypercapnia (HCC)    No Known Allergies  Immunization History  Administered Date(s) Administered   PFIZER(Purple Top)SARS-COV-2 Vaccination 12/26/2019, 01/16/2020   PPD Test 11/16/2021    Past Medical History:  Diagnosis Date   Acute respiratory failure with hypoxia and hypercarbia (HCC)    AKI (acute kidney injury) (HCC)    Community acquired pneumonia    Demand ischemia (HCC)    Diarrhea    Hypercapnic respiratory failure (HCC) 06/25/2020   Hypoglycemia    Hypokalemia    Morbid obesity (HCC)    Normocytic anemia    OSA treated with BiPAP    Pulmonary embolism (HCC)    Transaminitis     Tobacco History: Social History   Tobacco Use  Smoking Status Never  Smokeless Tobacco Never   Counseling given: Not Answered   Continue to not smoke  Outpatient Encounter Medications as of 12/02/2022  Medication Sig   Blood Pressure Monitor KIT Check blood pressure daily.   diphenhydrAMINE-zinc acetate (BENADRYL) cream Apply topically 2 (two) times daily as needed for itching.   docusate sodium (COLACE) 100 MG capsule Take 1 capsule (100 mg total) by mouth 2 (two) times daily as needed for mild constipation.   Ensure Max Protein (ENSURE MAX PROTEIN) LIQD Take 330 mLs (11 oz total) by mouth 2 (two) times daily.   furosemide (LASIX) 40 MG tablet Take 1 tablet (40 mg total) by mouth 2 (two) times daily as needed.   Misc. Devices (BARIATRIC ROLLATOR) MISC 1 each by Does not apply route daily.   NON FORMULARY CPAP Machine   Nutritional Supplements (,FEEDING SUPPLEMENT, PROSOURCE PLUS) liquid Take 30 mLs by mouth 2 (two) times daily between meals.   polyethylene glycol (MIRALAX / GLYCOLAX) 17 g packet Take 17 g by  mouth daily as needed for moderate constipation.   potassium chloride SA (KLOR-CON M) 20 MEQ tablet Take 1 tablet (20 mEq total) by mouth 2 (two) times daily as needed. Take with furosemide   [DISCONTINUED] albuterol (PROVENTIL) (2.5 MG/3ML) 0.083% nebulizer solution Take 3 mLs (2.5 mg total) by nebulization every 4 (four) hours as needed for wheezing or shortness of breath.   [DISCONTINUED] albuterol (VENTOLIN HFA) 108 (90 Base)  MCG/ACT inhaler Inhale 2 puffs into the lungs every 6 (six) hours as needed for wheezing or shortness of breath.   albuterol (VENTOLIN HFA) 108 (90 Base) MCG/ACT inhaler Inhale 2 puffs into the lungs every 6 (six) hours as needed for wheezing or shortness of breath.   No facility-administered encounter medications on file as of 12/02/2022.     Review of Systems  Review of Systems  N/a Physical Exam  BP (!) 146/98 (BP Location: Left Wrist, Patient Position: Sitting, Cuff Size: Large)   Pulse 70   Temp 97.8 F (36.6 C) (Oral)   Ht 5\' 4"  (1.626 m)   Wt (!) 398 lb 6.4 oz (180.7 kg)   SpO2 98%   BMI 68.39 kg/m   Wt Readings from Last 5 Encounters:  12/02/22 (!) 398 lb 6.4 oz (180.7 kg)  08/12/22 (!) 389 lb 14.4 oz (176.9 kg)  05/27/22 (!) 376 lb (170.6 kg)  04/14/22 (!) 382 lb 12.8 oz (173.6 kg)  04/01/22 (!) 388 lb (176 kg)    BMI Readings from Last 5 Encounters:  12/02/22 68.39 kg/m  08/12/22 71.31 kg/m  05/27/22 68.77 kg/m  04/14/22 65.71 kg/m  04/01/22 70.97 kg/m     Physical Exam General: Obese, no acute distress Eyes: EOMI, no icterus Neck: Supple, no JVP appreciated, habitus makes it difficult to fully assess Cardiovascular: Regular in rhythm, no murmur Pulmonary: Distant, clear to auscultation bilaterally, no wheeze, normal work of breathing Abdomen: Nondistended, bowel sounds present Psych: Normal mood, full affect MSK: No synovitis, joint effusion   Assessment & Plan:   Chronic hypercarbic respiratory failure due to OHS with  likely OSA: Recent admission with hypercarbia and uncompensated respiratory acidosis.  Felt to be combination of CHF exacerbation and possible exacerbation of OHS.  She reports good adherence to wearing BiPAP at night every night now.   Continue BiPAP for now, is certainly providing significant clinical benefit.  Polysomnography ordered in the past was not completed, she did not do this.  Pulmonary embolus: 06/2020.  McConnell sign on echocardiogram.  At risk for chronic RV dysfunction in the setting of OHS/OSA.  Only completed 2 months of Xarelto due to cost.  TTE ordered at my evaluation 08/2020.  She did not follow-up with this.  Citing financial difficulties at the time.  Never followed up with me.  Subsequent TTE 05/2021 with RV dilation and normal estimated PASP, 09/2021 with normal RV function and normal estimated PASP.  VQ scan 03/2022 with no sign of PE, reassuring.  No further evaluation required.   Return in about 1 year (around 12/02/2023) for f/u Dr. Judeth Horn.   Karren Burly, MD 12/02/2022

## 2022-12-14 ENCOUNTER — Ambulatory Visit: Payer: Self-pay | Admitting: Nurse Practitioner

## 2024-01-08 IMAGING — CT CT ANGIO CHEST
2 of 6 series · 18 of 36 positions shown · IV contrast (agent unspecified)
Comparison: 06/30/2020

CLINICAL DATA: High probability for pulmonary embolism.

EXAM:
CT ANGIOGRAPHY CHEST WITH CONTRAST
TECHNIQUE: Multidetector CT imaging of the chest was performed using the
standard protocol during bolus administration of intravenous
contrast. Multiplanar CT image reconstructions and MIPs were
obtained to evaluate the vascular anatomy.

[Series 6: thins · axial · 0.67mm/px · z∈[+792,+1028]mm · 17 of 266 slices shown]
[im 15/266  lung]
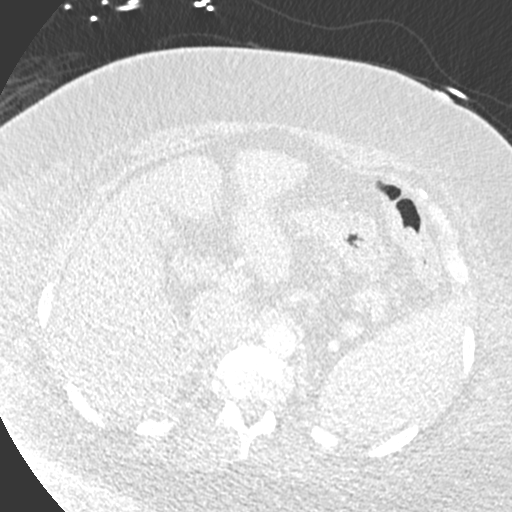
[im 30/266  mediastinal]
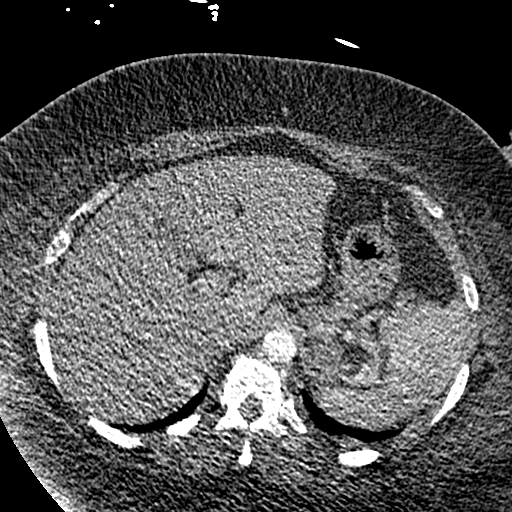
[im 45/266  lung]
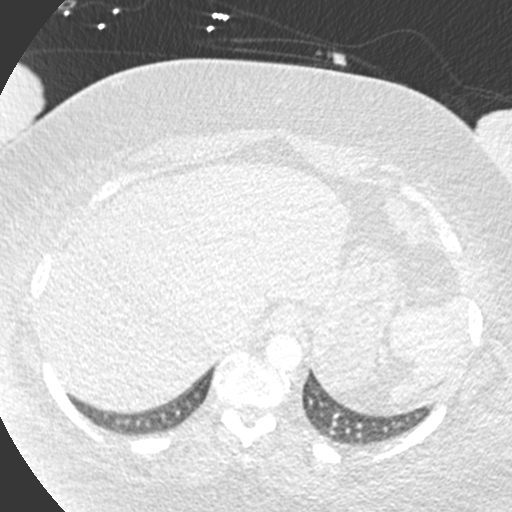
[im 59/266  mediastinal]
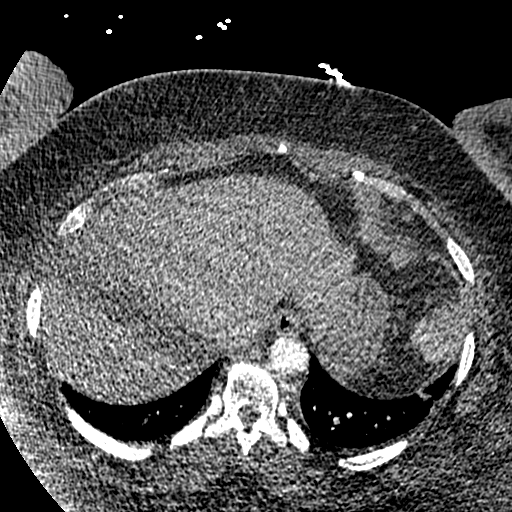
[im 74/266  lung]
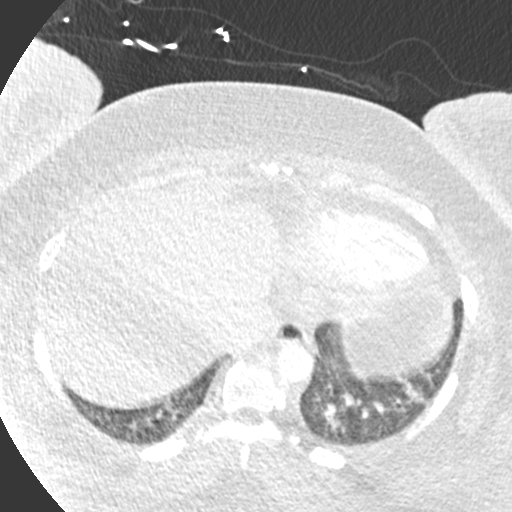
[im 89/266  mediastinal]
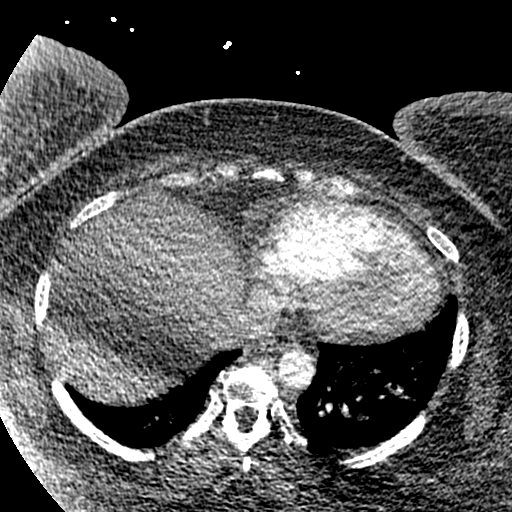
[im 104/266  lung]
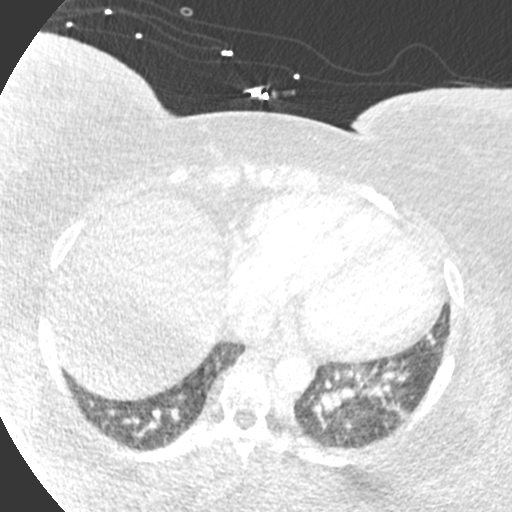
[im 118/266  mediastinal]
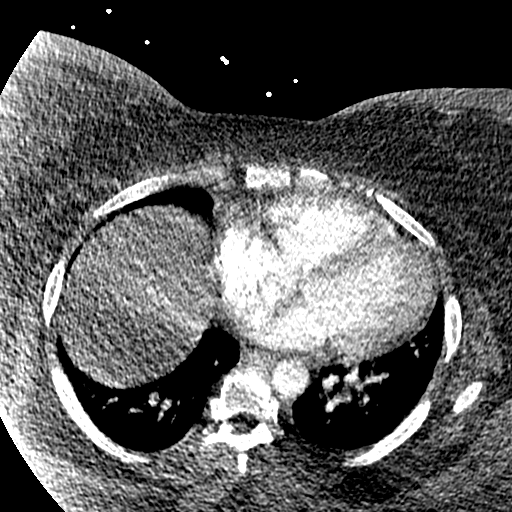
[im 133/266  lung]
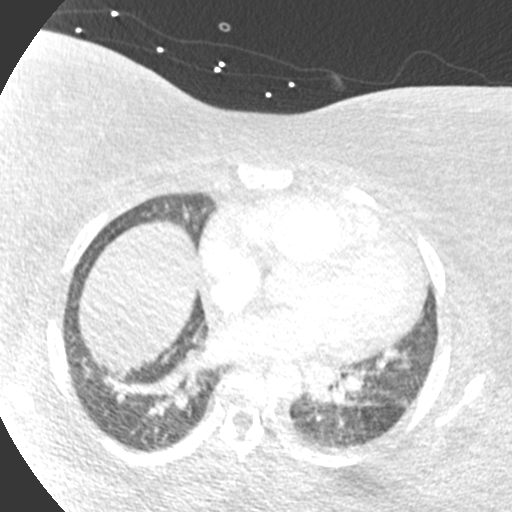
[im 148/266  mediastinal]
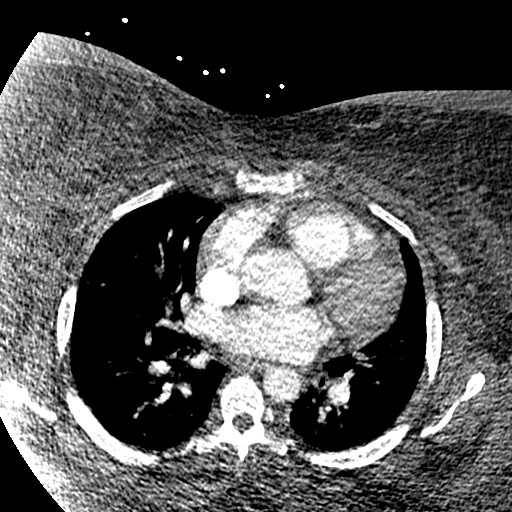
[im 162/266  lung]
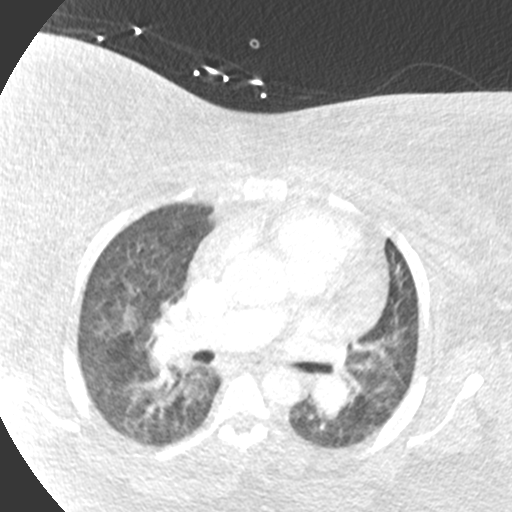
[im 177/266  mediastinal]
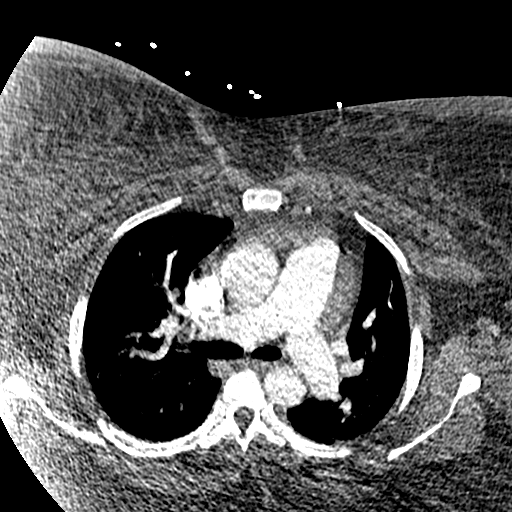
[im 192/266  lung]
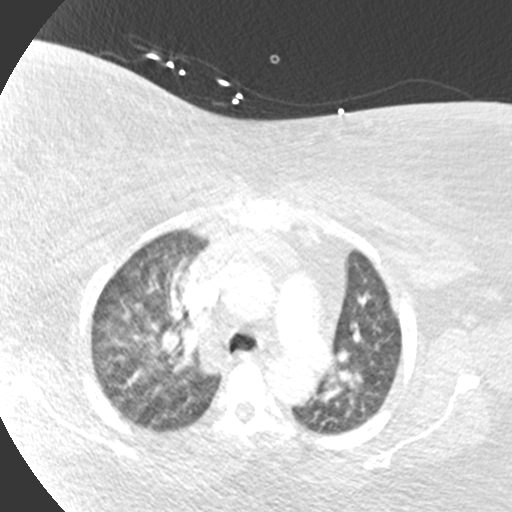
[im 207/266  mediastinal]
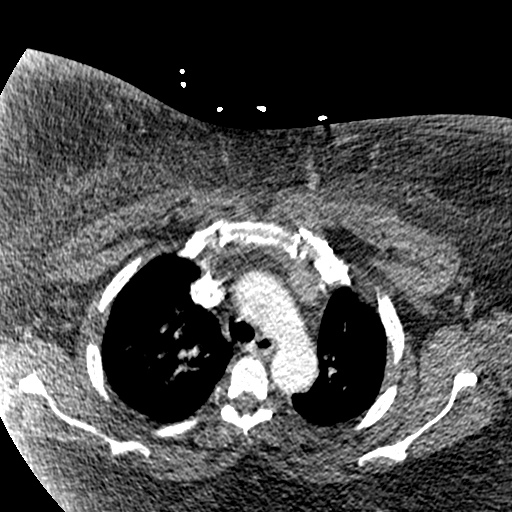
[im 221/266  lung]
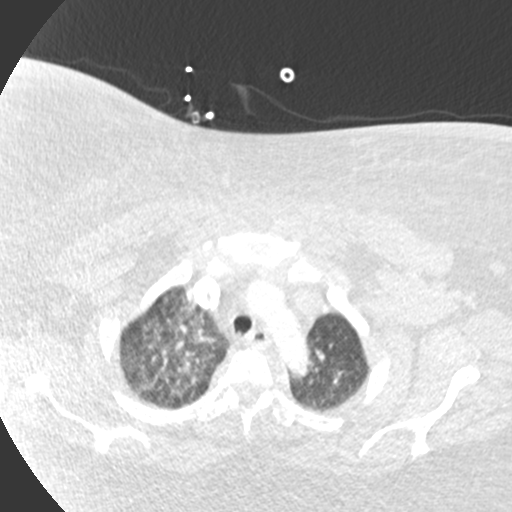
[im 236/266  mediastinal]
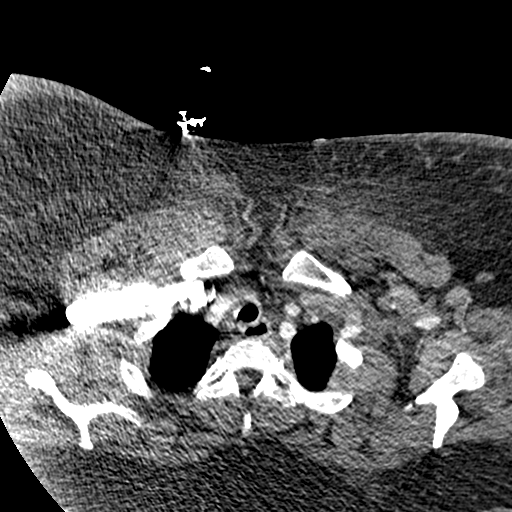
[im 251/266  lung]
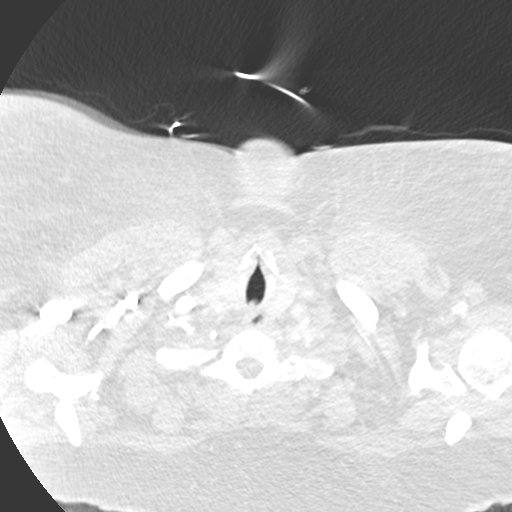

[Series 8: coronal mpr · coronal · 0.50mm/px · 1 of 132 slices shown]
[im 66/132  mediastinal]
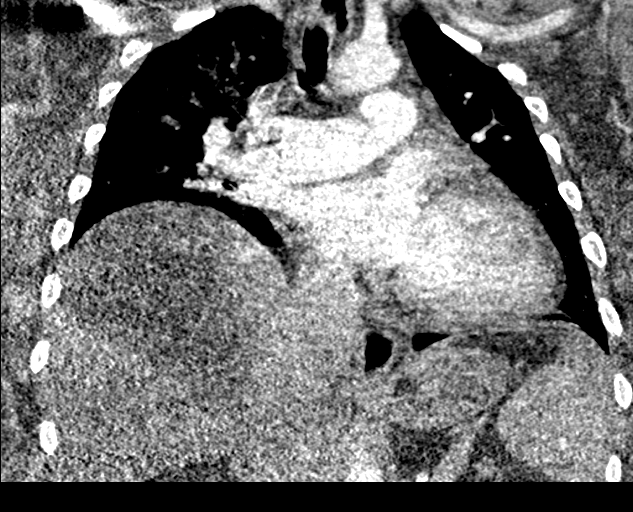

[18 of 36 positions shown; findings below may reference images not displayed]

RADIATION DOSE REDUCTION: This exam was performed according to the
departmental dose-optimization program which includes automated
exposure control, adjustment of the mA and/or kV according to
patient size and/or use of iterative reconstruction technique.

CONTRAST:  100mL OMNIPAQUE IOHEXOL 350 MG/ML SOLN
FINDINGS: Cardiovascular: Satisfactory opacification of the pulmonary arteries
to the segmental level but very limited at the segmental and beyond
levels due to body habitus and motion artifact. No evidence of
pulmonary embolism. Enlarged heart size. No pericardial effusion.
Large main pulmonary artery at 3.7 cm diameter, suggesting pulmonary
hypertension

Mediastinum/Nodes: Negative for adenopathy or mass

Lungs/Pleura: Central airways are clear. Mild hazy and streaky
density in the lungs favors atelectasis from expiratory phase.

Upper Abdomen: No acute finding

Musculoskeletal: Body wall edema. Diffuse degenerative endplate
spurring.

Review of the MIP images confirms the above findings.
IMPRESSION: 1. Very limited CTA primarily due to body habitus and motion. No
gross pulmonary embolism. There are signs of chronic pulmonary
hypertension.
2. Low volume chest with atelectasis.
3. Cardiomegaly and body wall edema.

## 2024-01-08 IMAGING — CT CT HEAD W/O CM
3 of 4 series · 15 of 47 positions shown, 18 images · non-contrast
Comparison: 06/25/2020.

CLINICAL DATA: Mental status change, unknown cause.



[Series 2: head wo · axial · 0.47mm/px · z∈[+1323,+1458]mm · 9 of 33 slices shown, 12 images]
[im 3/33  brain]
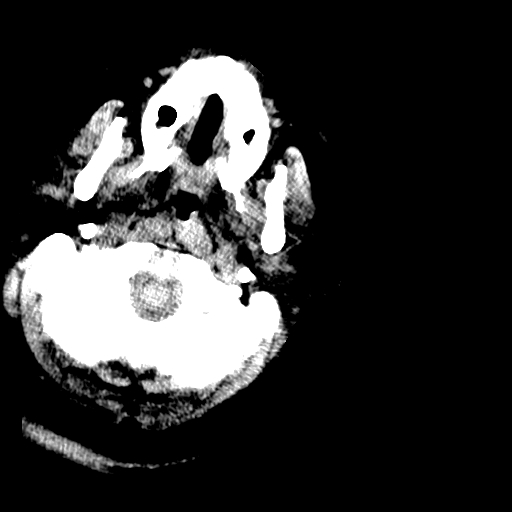
[im 3/33  bone]
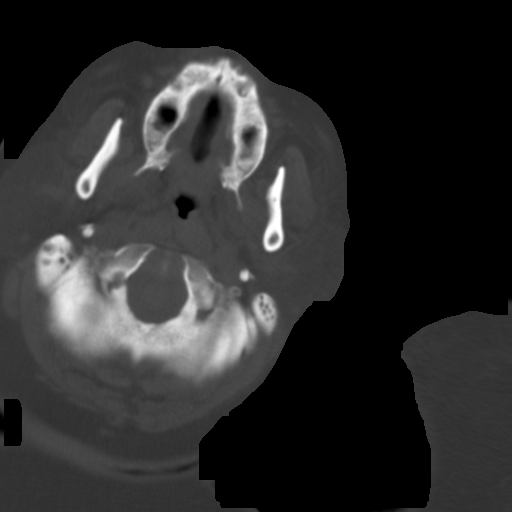
[im 7/33  brain]
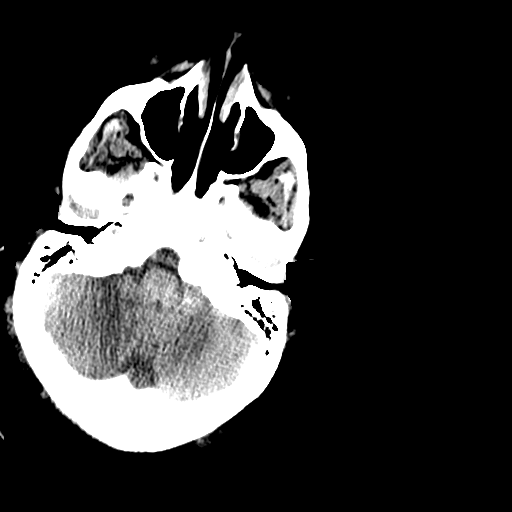
[im 10/33  brain]
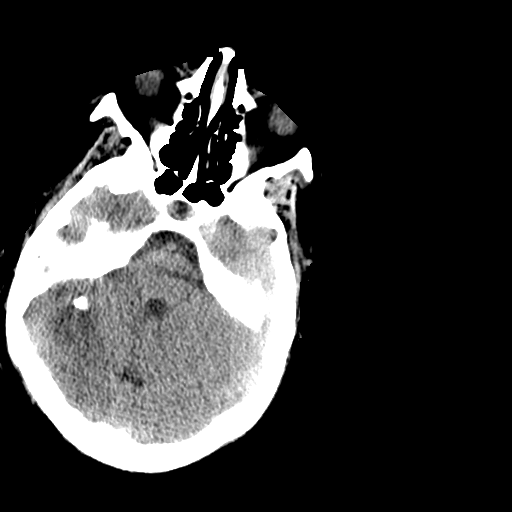
[im 14/33  brain]
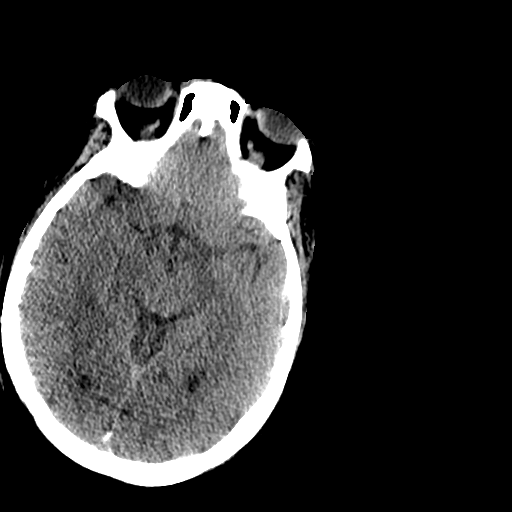
[im 17/33  brain]
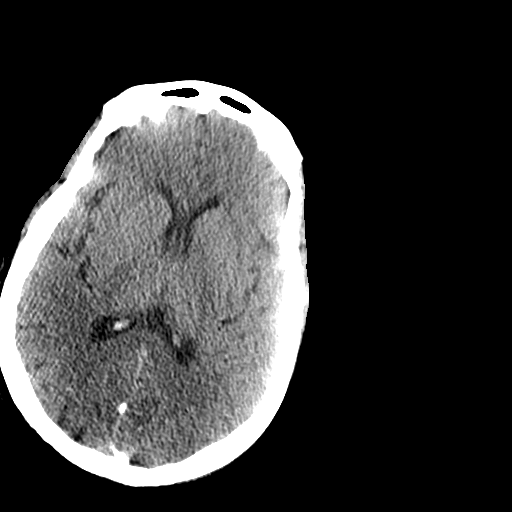
[im 17/33  bone]
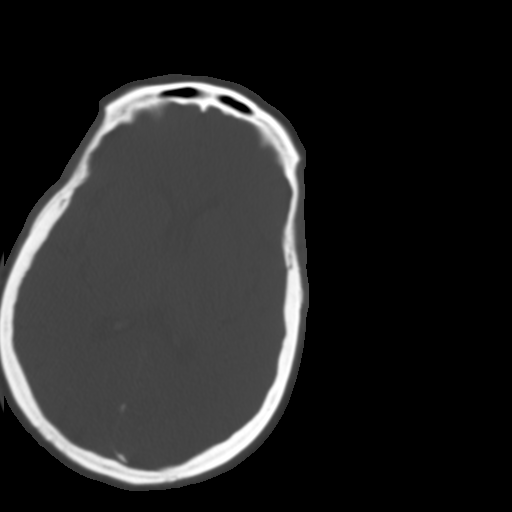
[im 19/33  brain]
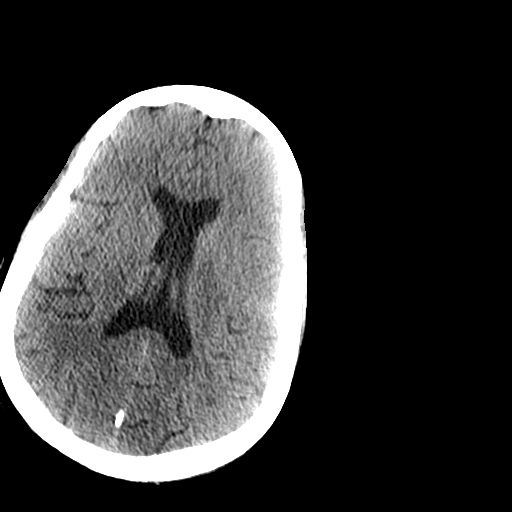
[im 23/33  brain]
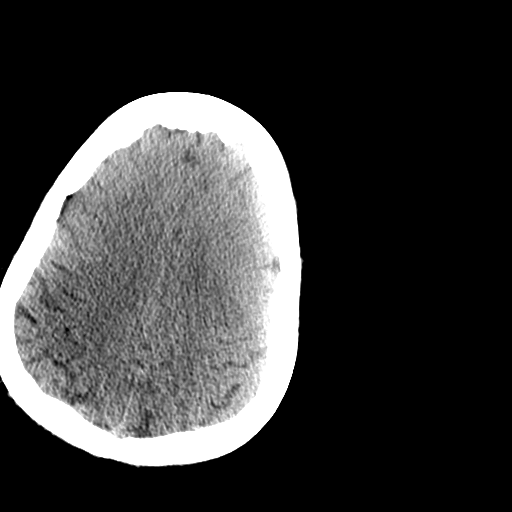
[im 26/33  brain]
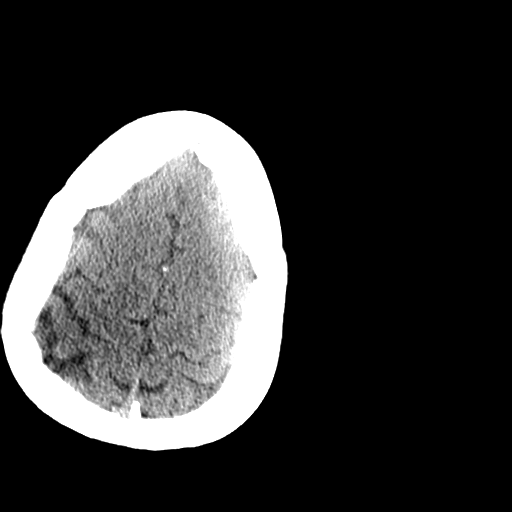
[im 30/33  brain]
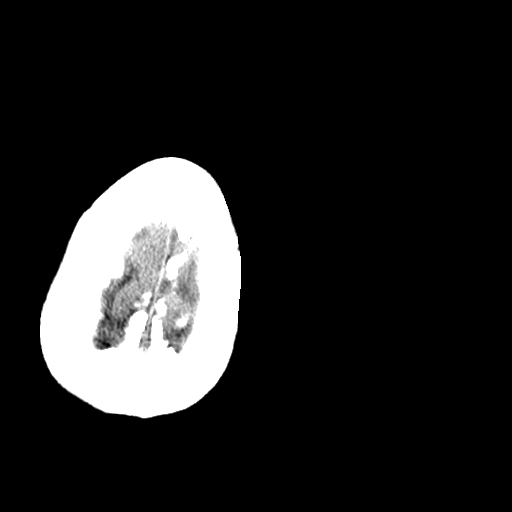
[im 30/33  bone]
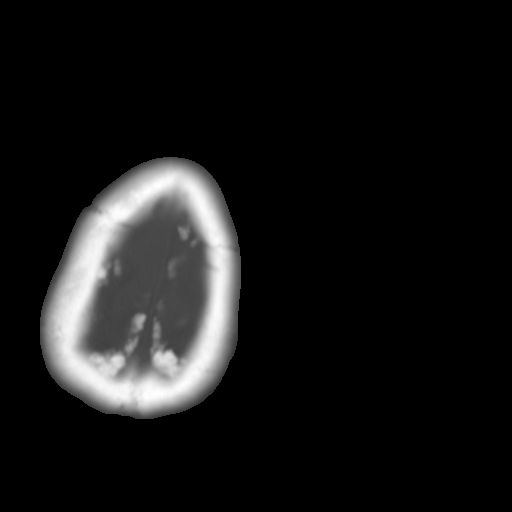

[Series 5: coronal soft tissue · coronal · 0.30mm/px · 3 of 69 slices shown]
[im 23/69  brain]
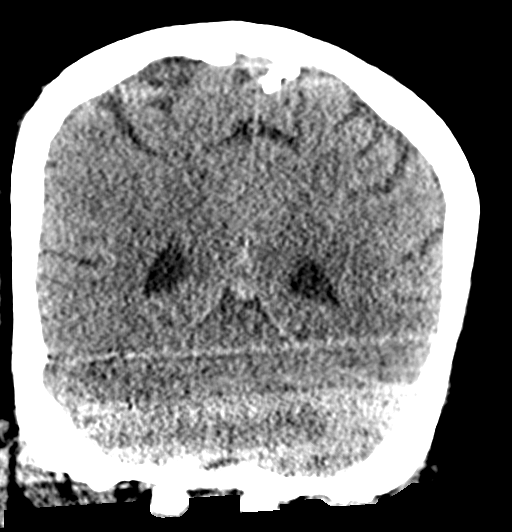
[im 31/69  brain]
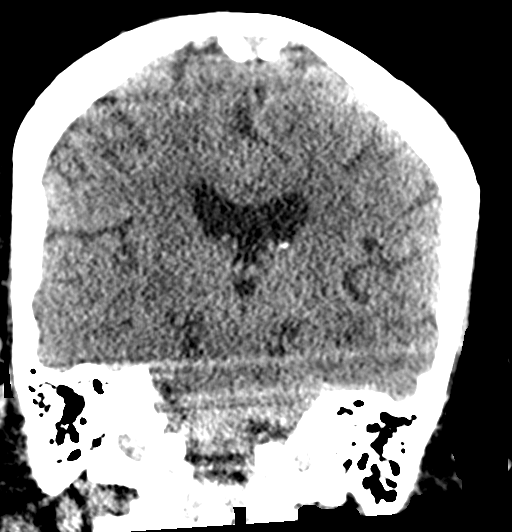
[im 38/69  brain]
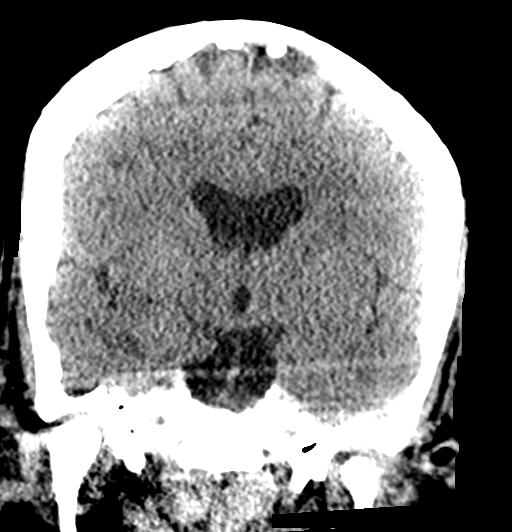

[Series 6: sagittal soft tissue · sagittal · 0.32mm/px · 3 of 48 slices shown]
[im 16/48  brain]
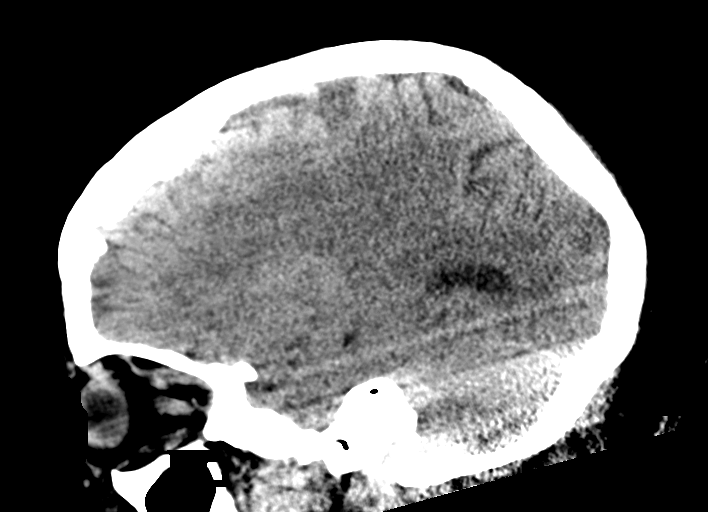
[im 24/48  brain]
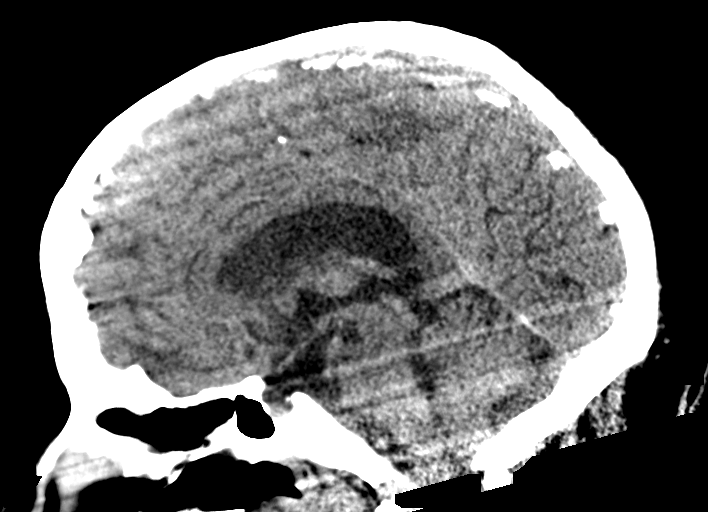
[im 32/48  brain]
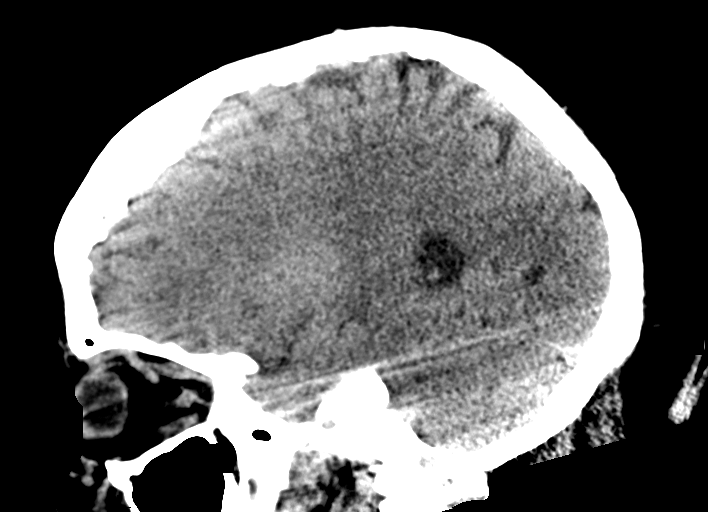

[15 of 47 positions shown; findings below may reference images not displayed]

FINDINGS: Brain: No acute intracranial hemorrhage, midline shift or mass
effect. No extra-axial fluid collection. Mild periventricular white
matter hypodensities are present bilaterally. No hydrocephalus.

Vascular: No hyperdense vessel or unexpected calcification.

Skull: Normal. Negative for fracture or focal lesion.

Sinuses/Orbits: Mild mucosal thickening in the maxillary sinuses. No
acute orbital abnormality.

Other: None.
IMPRESSION: 1. No acute intracranial process.
2. Chronic microvascular ischemic changes.

## 2024-03-15 ENCOUNTER — Telehealth: Payer: Self-pay | Admitting: *Deleted

## 2024-03-15 NOTE — Telephone Encounter (Signed)
 LVM  Last ov 11/2022 Patient needs appt for OSA f/u before orders can be submitted.    Copied from CRM [id number]. Topic: Clinical - Order For Equipment >> Mar 13, 2024  8:01 AM Russell PARAS wrote: Reason for CRM:   Pt is contacting clinic regarding her BiPAP supplies. She was advised by [employer] they would need verification from clinic that she still requires the equipment before they will send out additional supplies.  Requested call back from nurse to discuss  CB#  [phone]

## 2024-03-16 ENCOUNTER — Ambulatory Visit: Admitting: Pulmonary Disease

## 2024-03-23 NOTE — Telephone Encounter (Signed)
 2nd message patient needs appt Closing encounter.

## 2024-05-07 ENCOUNTER — Encounter: Admitting: Nurse Practitioner
